# Patient Record
Sex: Male | Born: 1960 | Race: Black or African American | Hispanic: No | Marital: Single | Smoking: Never smoker
Health system: Southern US, Community
[De-identification: ages and names within clinical notes are randomized; demographics above are authoritative.]

## PROBLEM LIST (undated history)

## (undated) DIAGNOSIS — M1990 Unspecified osteoarthritis, unspecified site: Secondary | ICD-10-CM

## (undated) DIAGNOSIS — E785 Hyperlipidemia, unspecified: Secondary | ICD-10-CM

## (undated) DIAGNOSIS — I10 Essential (primary) hypertension: Secondary | ICD-10-CM

## (undated) DIAGNOSIS — F32A Depression, unspecified: Secondary | ICD-10-CM

## (undated) DIAGNOSIS — Z21 Asymptomatic human immunodeficiency virus [HIV] infection status: Secondary | ICD-10-CM

## (undated) DIAGNOSIS — B20 Human immunodeficiency virus [HIV] disease: Secondary | ICD-10-CM

## (undated) DIAGNOSIS — F329 Major depressive disorder, single episode, unspecified: Secondary | ICD-10-CM

## (undated) HISTORY — DX: Essential (primary) hypertension: I10

## (undated) HISTORY — DX: Hyperlipidemia, unspecified: E78.5

## (undated) HISTORY — DX: Depression, unspecified: F32.A

## (undated) HISTORY — DX: Major depressive disorder, single episode, unspecified: F32.9

## (undated) HISTORY — DX: Unspecified osteoarthritis, unspecified site: M19.90

---

## 1997-12-23 ENCOUNTER — Ambulatory Visit (HOSPITAL_COMMUNITY): Admission: RE | Admit: 1997-12-23 | Discharge: 1997-12-23 | Payer: Self-pay | Admitting: Family Medicine

## 1999-01-19 ENCOUNTER — Encounter: Payer: Self-pay | Admitting: Internal Medicine

## 1999-01-19 ENCOUNTER — Ambulatory Visit (HOSPITAL_COMMUNITY): Admission: RE | Admit: 1999-01-19 | Discharge: 1999-01-19 | Payer: Self-pay | Admitting: Internal Medicine

## 1999-01-25 ENCOUNTER — Encounter: Payer: Self-pay | Admitting: Internal Medicine

## 1999-01-25 ENCOUNTER — Ambulatory Visit (HOSPITAL_COMMUNITY): Admission: RE | Admit: 1999-01-25 | Discharge: 1999-01-25 | Payer: Self-pay | Admitting: Internal Medicine

## 1999-02-14 ENCOUNTER — Encounter: Payer: Self-pay | Admitting: Family Medicine

## 1999-02-14 ENCOUNTER — Ambulatory Visit (HOSPITAL_COMMUNITY): Admission: RE | Admit: 1999-02-14 | Discharge: 1999-02-14 | Payer: Self-pay | Admitting: Family Medicine

## 2000-05-25 ENCOUNTER — Encounter: Payer: Self-pay | Admitting: Family Medicine

## 2000-05-25 ENCOUNTER — Ambulatory Visit (HOSPITAL_COMMUNITY): Admission: RE | Admit: 2000-05-25 | Discharge: 2000-05-25 | Payer: Self-pay | Admitting: Family Medicine

## 2000-06-28 ENCOUNTER — Emergency Department (HOSPITAL_COMMUNITY): Admission: EM | Admit: 2000-06-28 | Discharge: 2000-06-28 | Payer: Self-pay | Admitting: Emergency Medicine

## 2001-06-21 ENCOUNTER — Ambulatory Visit (HOSPITAL_COMMUNITY): Admission: RE | Admit: 2001-06-21 | Discharge: 2001-06-21 | Payer: Self-pay | Admitting: Internal Medicine

## 2001-06-21 ENCOUNTER — Encounter: Payer: Self-pay | Admitting: Internal Medicine

## 2002-01-05 ENCOUNTER — Encounter: Payer: Self-pay | Admitting: Emergency Medicine

## 2002-01-05 ENCOUNTER — Emergency Department (HOSPITAL_COMMUNITY): Admission: EM | Admit: 2002-01-05 | Discharge: 2002-01-05 | Payer: Self-pay | Admitting: Emergency Medicine

## 2002-04-01 ENCOUNTER — Ambulatory Visit (HOSPITAL_COMMUNITY): Admission: RE | Admit: 2002-04-01 | Discharge: 2002-04-01 | Payer: Self-pay | Admitting: Internal Medicine

## 2002-04-05 ENCOUNTER — Ambulatory Visit (HOSPITAL_COMMUNITY): Admission: RE | Admit: 2002-04-05 | Discharge: 2002-04-05 | Payer: Self-pay | Admitting: Internal Medicine

## 2002-04-05 ENCOUNTER — Encounter: Payer: Self-pay | Admitting: Internal Medicine

## 2003-08-26 ENCOUNTER — Emergency Department (HOSPITAL_COMMUNITY): Admission: EM | Admit: 2003-08-26 | Discharge: 2003-08-26 | Payer: Self-pay | Admitting: Family Medicine

## 2003-09-01 ENCOUNTER — Emergency Department (HOSPITAL_COMMUNITY): Admission: EM | Admit: 2003-09-01 | Discharge: 2003-09-01 | Payer: Self-pay | Admitting: Family Medicine

## 2003-09-29 ENCOUNTER — Encounter: Admission: RE | Admit: 2003-09-29 | Discharge: 2003-09-29 | Payer: Self-pay | Admitting: Internal Medicine

## 2003-09-29 ENCOUNTER — Ambulatory Visit (HOSPITAL_COMMUNITY): Admission: RE | Admit: 2003-09-29 | Discharge: 2003-09-29 | Payer: Self-pay | Admitting: Internal Medicine

## 2004-03-22 ENCOUNTER — Emergency Department (HOSPITAL_COMMUNITY): Admission: EM | Admit: 2004-03-22 | Discharge: 2004-03-22 | Payer: Self-pay | Admitting: Family Medicine

## 2004-03-22 ENCOUNTER — Ambulatory Visit: Payer: Self-pay | Admitting: *Deleted

## 2004-03-22 ENCOUNTER — Ambulatory Visit: Payer: Self-pay | Admitting: Internal Medicine

## 2004-05-06 ENCOUNTER — Emergency Department (HOSPITAL_COMMUNITY): Admission: EM | Admit: 2004-05-06 | Discharge: 2004-05-06 | Payer: Self-pay | Admitting: Family Medicine

## 2004-06-23 ENCOUNTER — Ambulatory Visit: Payer: Self-pay | Admitting: Internal Medicine

## 2004-08-13 ENCOUNTER — Ambulatory Visit: Payer: Self-pay | Admitting: Family Medicine

## 2004-08-24 ENCOUNTER — Ambulatory Visit: Payer: Self-pay | Admitting: Family Medicine

## 2004-10-15 ENCOUNTER — Ambulatory Visit: Payer: Self-pay | Admitting: Family Medicine

## 2004-12-31 ENCOUNTER — Ambulatory Visit: Payer: Self-pay | Admitting: Family Medicine

## 2005-01-25 ENCOUNTER — Emergency Department (HOSPITAL_COMMUNITY): Admission: EM | Admit: 2005-01-25 | Discharge: 2005-01-25 | Payer: Self-pay | Admitting: Family Medicine

## 2005-01-25 ENCOUNTER — Ambulatory Visit: Payer: Self-pay | Admitting: Family Medicine

## 2005-02-17 ENCOUNTER — Ambulatory Visit: Payer: Self-pay | Admitting: Family Medicine

## 2005-02-18 ENCOUNTER — Ambulatory Visit (HOSPITAL_COMMUNITY): Admission: RE | Admit: 2005-02-18 | Discharge: 2005-02-18 | Payer: Self-pay | Admitting: Family Medicine

## 2005-06-10 ENCOUNTER — Ambulatory Visit: Payer: Self-pay | Admitting: Family Medicine

## 2005-09-23 ENCOUNTER — Ambulatory Visit: Payer: Self-pay | Admitting: Family Medicine

## 2005-10-20 ENCOUNTER — Ambulatory Visit: Payer: Self-pay | Admitting: Family Medicine

## 2006-01-26 ENCOUNTER — Ambulatory Visit: Payer: Self-pay | Admitting: Family Medicine

## 2006-02-01 ENCOUNTER — Ambulatory Visit: Payer: Self-pay | Admitting: Family Medicine

## 2006-03-08 ENCOUNTER — Ambulatory Visit: Payer: Self-pay | Admitting: Family Medicine

## 2006-06-23 ENCOUNTER — Ambulatory Visit: Payer: Self-pay | Admitting: Internal Medicine

## 2006-06-28 ENCOUNTER — Emergency Department (HOSPITAL_COMMUNITY): Admission: EM | Admit: 2006-06-28 | Discharge: 2006-06-28 | Payer: Self-pay | Admitting: Family Medicine

## 2006-06-29 ENCOUNTER — Ambulatory Visit: Payer: Self-pay | Admitting: Internal Medicine

## 2006-07-13 ENCOUNTER — Ambulatory Visit: Payer: Self-pay | Admitting: Internal Medicine

## 2007-02-14 ENCOUNTER — Encounter (INDEPENDENT_AMBULATORY_CARE_PROVIDER_SITE_OTHER): Payer: Self-pay | Admitting: *Deleted

## 2007-03-12 ENCOUNTER — Emergency Department (HOSPITAL_COMMUNITY): Admission: EM | Admit: 2007-03-12 | Discharge: 2007-03-12 | Payer: Self-pay | Admitting: Emergency Medicine

## 2007-03-15 ENCOUNTER — Ambulatory Visit: Payer: Self-pay | Admitting: Internal Medicine

## 2007-03-16 ENCOUNTER — Encounter: Payer: Self-pay | Admitting: Internal Medicine

## 2007-03-16 LAB — CONVERTED CEMR LAB
ALT: 18 units/L (ref 0–53)
Absolute CD4: 568 #/uL (ref 381–1469)
Alkaline Phosphatase: 55 units/L (ref 39–117)
BUN: 18 mg/dL (ref 6–23)
Basophils Relative: 0 % (ref 0–1)
CO2: 18 meq/L — ABNORMAL LOW (ref 19–32)
Chloride: 106 meq/L (ref 96–112)
Glucose, Bld: 115 mg/dL — ABNORMAL HIGH (ref 70–99)
HCT: 44 % (ref 39.0–52.0)
HDL: 46 mg/dL (ref >39)
Hemoglobin: 14.2 g/dL (ref 13.0–17.0)
LDL Cholesterol: 279 mg/dL — ABNORMAL HIGH (ref 0–99)
Potassium: 4.1 meq/L (ref 3.5–5.3)
RBC: 4.19 M/uL — ABNORMAL LOW (ref 4.22–5.81)
RDW: 13.1 % (ref 11.5–14.0)
Sodium: 137 meq/L (ref 135–145)
Total CHOL/HDL Ratio: 7.6
Total Lymphocytes %: 19 % (ref 12–46)
Triglycerides: 116 mg/dL (ref <150)
VLDL: 23 mg/dL (ref 0–40)
WBC: 8.3 10*3/uL (ref 4.0–10.5)

## 2007-03-19 ENCOUNTER — Emergency Department (HOSPITAL_COMMUNITY): Admission: EM | Admit: 2007-03-19 | Discharge: 2007-03-19 | Payer: Self-pay | Admitting: Family Medicine

## 2007-05-17 ENCOUNTER — Ambulatory Visit: Payer: Self-pay | Admitting: Internal Medicine

## 2007-06-18 ENCOUNTER — Ambulatory Visit: Payer: Self-pay | Admitting: Internal Medicine

## 2007-06-18 LAB — CONVERTED CEMR LAB
ALT: 25 units/L (ref 0–53)
Albumin: 4.1 g/dL (ref 3.5–5.2)
Basophils Relative: 1 % (ref 0–1)
CO2: 21 meq/L (ref 19–32)
Chloride: 105 meq/L (ref 96–112)
Eosinophils Absolute: 0 10*3/uL (ref 0.0–0.7)
Eosinophils Relative: 1 % (ref 0–5)
Glucose, Bld: 93 mg/dL (ref 70–99)
HIV 1 RNA Quant: <50 copies/mL (ref <50)
HIV-1 RNA Quant, Log: <1.7 (ref <1.70)
Hemoglobin: 14.2 g/dL (ref 13.0–17.0)
MCV: 102.9 fL — ABNORMAL HIGH (ref 78.0–100.0)
Monocytes Relative: 11 % (ref 3–12)
Platelets: 220 10*3/uL (ref 150–400)
RBC: 4.16 M/uL — ABNORMAL LOW (ref 4.22–5.81)
Total CHOL/HDL Ratio: 4
Total Protein: 7.7 g/dL (ref 6.0–8.3)
VLDL: 31 mg/dL (ref 0–40)
WBC: 3.5 10*3/uL — ABNORMAL LOW (ref 4.0–10.5)

## 2007-06-24 ENCOUNTER — Emergency Department (HOSPITAL_COMMUNITY): Admission: EM | Admit: 2007-06-24 | Discharge: 2007-06-24 | Payer: Self-pay | Admitting: Family Medicine

## 2007-07-12 ENCOUNTER — Ambulatory Visit: Payer: Self-pay | Admitting: Internal Medicine

## 2007-07-26 ENCOUNTER — Ambulatory Visit: Payer: Self-pay | Admitting: Internal Medicine

## 2007-08-02 ENCOUNTER — Ambulatory Visit: Payer: Self-pay | Admitting: Internal Medicine

## 2007-08-09 ENCOUNTER — Ambulatory Visit: Payer: Self-pay | Admitting: Internal Medicine

## 2007-09-05 ENCOUNTER — Emergency Department (HOSPITAL_COMMUNITY): Admission: EM | Admit: 2007-09-05 | Discharge: 2007-09-05 | Payer: Self-pay | Admitting: Emergency Medicine

## 2007-10-02 ENCOUNTER — Ambulatory Visit: Payer: Self-pay | Admitting: Internal Medicine

## 2007-11-21 ENCOUNTER — Emergency Department (HOSPITAL_COMMUNITY): Admission: EM | Admit: 2007-11-21 | Discharge: 2007-11-21 | Payer: Self-pay | Admitting: Emergency Medicine

## 2007-11-26 ENCOUNTER — Ambulatory Visit: Payer: Self-pay | Admitting: Internal Medicine

## 2007-11-27 ENCOUNTER — Encounter: Payer: Self-pay | Admitting: Internal Medicine

## 2007-11-27 LAB — CONVERTED CEMR LAB
ALT: 28 units/L (ref 0–53)
Alkaline Phosphatase: 68 units/L (ref 39–117)
BUN: 16 mg/dL (ref 6–23)
Basophils Absolute: 0 10*3/uL (ref 0.0–0.1)
Basophils Relative: 1 % (ref 0–1)
CO2: 20 meq/L (ref 19–32)
Chlamydia, Swab/Urine, PCR: NEGATIVE
Chloride: 103 meq/L (ref 96–112)
Creatinine, Ser: 1.1 mg/dL (ref 0.40–1.50)
Eosinophils Absolute: 0.1 10*3/uL (ref 0.0–0.7)
Eosinophils Relative: 2 % (ref 0–5)
GC Probe Amp, Urine: NEGATIVE
Glucose, Bld: 91 mg/dL (ref 70–99)
HCT: 44.3 % (ref 39.0–52.0)
HCV Ab: NEGATIVE
Hep A Total Ab: POSITIVE — AB
MCV: 106.5 fL — ABNORMAL HIGH (ref 78.0–100.0)
Monocytes Absolute: 0.4 10*3/uL (ref 0.1–1.0)
Monocytes Relative: 9 % (ref 3–12)
Platelets: 222 10*3/uL (ref 150–400)
Potassium: 4.2 meq/L (ref 3.5–5.3)
RDW: 13.3 % (ref 11.5–15.5)
Total Lymphocytes %: 38 % (ref 12–46)
Total Protein: 7.8 g/dL (ref 6.0–8.3)
WBC, lymph enumeration: 4.4 10*3/uL (ref 4.0–10.5)

## 2007-11-28 ENCOUNTER — Ambulatory Visit: Payer: Self-pay | Admitting: Internal Medicine

## 2008-02-28 ENCOUNTER — Ambulatory Visit: Payer: Self-pay | Admitting: Internal Medicine

## 2008-03-06 ENCOUNTER — Ambulatory Visit: Payer: Self-pay | Admitting: Internal Medicine

## 2008-03-06 LAB — CONVERTED CEMR LAB
AST: 22 units/L (ref 0–37)
BUN: 13 mg/dL (ref 6–23)
Basophils Absolute: 0 10*3/uL (ref 0.0–0.1)
Basophils Relative: 1 % (ref 0–1)
CO2: 21 meq/L (ref 19–32)
Eosinophils Absolute: 0.1 10*3/uL (ref 0.0–0.7)
Eosinophils Relative: 2 % (ref 0–5)
Glucose, Bld: 91 mg/dL (ref 70–99)
HCT: 41.6 % (ref 39.0–52.0)
HIV-1 RNA Quant, Log: <1.7 (ref <1.70)
Monocytes Absolute: 0.4 10*3/uL (ref 0.1–1.0)
Monocytes Relative: 9 % (ref 3–12)
Neutro Abs: 1.8 10*3/uL (ref 1.7–7.7)
Platelets: 201 10*3/uL (ref 150–400)
Potassium: 4.2 meq/L (ref 3.5–5.3)
RBC: 4.06 M/uL — ABNORMAL LOW (ref 4.22–5.81)
RDW: 12.9 % (ref 11.5–15.5)
Sodium: 137 meq/L (ref 135–145)
Total Bilirubin: 0.4 mg/dL (ref 0.3–1.2)
Total Protein: 7.7 g/dL (ref 6.0–8.3)
WBC, lymph enumeration: 4.4 10*3/uL (ref 4.0–10.5)
WBC: 4.4 10*3/uL (ref 4.0–10.5)

## 2008-03-07 ENCOUNTER — Ambulatory Visit: Payer: Self-pay | Admitting: Internal Medicine

## 2008-03-20 ENCOUNTER — Ambulatory Visit: Payer: Self-pay | Admitting: Internal Medicine

## 2008-05-01 ENCOUNTER — Ambulatory Visit: Payer: Self-pay | Admitting: Internal Medicine

## 2008-05-24 ENCOUNTER — Emergency Department (HOSPITAL_COMMUNITY): Admission: EM | Admit: 2008-05-24 | Discharge: 2008-05-24 | Payer: Self-pay | Admitting: Family Medicine

## 2008-06-05 ENCOUNTER — Ambulatory Visit: Payer: Self-pay | Admitting: Internal Medicine

## 2008-06-12 ENCOUNTER — Ambulatory Visit: Payer: Self-pay | Admitting: Internal Medicine

## 2008-06-12 ENCOUNTER — Ambulatory Visit (HOSPITAL_COMMUNITY): Admission: RE | Admit: 2008-06-12 | Discharge: 2008-06-12 | Payer: Self-pay | Admitting: Internal Medicine

## 2008-06-18 ENCOUNTER — Ambulatory Visit: Payer: Self-pay | Admitting: Internal Medicine

## 2008-06-18 LAB — CONVERTED CEMR LAB
AST: 16 units/L (ref 0–37)
BUN: 15 mg/dL (ref 6–23)
Basophils Absolute: 0 10*3/uL (ref 0.0–0.1)
CD4 T Helper %: 40 % (ref 32–62)
Calcium: 9.2 mg/dL (ref 8.4–10.5)
Chloride: 104 meq/L (ref 96–112)
Creatinine, Ser: 0.91 mg/dL (ref 0.40–1.50)
Eosinophils Relative: 2 % (ref 0–5)
Glucose, Bld: 102 mg/dL — ABNORMAL HIGH (ref 70–99)
MCHC: 32.8 g/dL (ref 30.0–36.0)
Monocytes Absolute: 0.4 10*3/uL (ref 0.1–1.0)
Monocytes Relative: 8 % (ref 3–12)
Neutrophils Relative %: 36 % — ABNORMAL LOW (ref 43–77)
Potassium: 4.6 meq/L (ref 3.5–5.3)
RBC: 3.94 M/uL — ABNORMAL LOW (ref 4.22–5.81)
RDW: 13.2 % (ref 11.5–15.5)
Sodium: 137 meq/L (ref 135–145)
Total Bilirubin: 0.3 mg/dL (ref 0.3–1.2)
Total Lymphocytes %: 54 % — ABNORMAL HIGH (ref 12–46)
Total Protein: 7.8 g/dL (ref 6.0–8.3)
Total lymphocyte count: 2700 cells/mcL (ref 700–3300)
WBC, lymph enumeration: 5 10*3/uL (ref 4.0–10.5)

## 2008-07-16 ENCOUNTER — Encounter: Payer: Self-pay | Admitting: Internal Medicine

## 2008-07-17 ENCOUNTER — Ambulatory Visit: Payer: Self-pay | Admitting: Internal Medicine

## 2008-07-17 ENCOUNTER — Encounter: Payer: Self-pay | Admitting: Internal Medicine

## 2008-07-17 DIAGNOSIS — E785 Hyperlipidemia, unspecified: Secondary | ICD-10-CM

## 2008-07-17 DIAGNOSIS — B20 Human immunodeficiency virus [HIV] disease: Secondary | ICD-10-CM | POA: Insufficient documentation

## 2008-07-17 DIAGNOSIS — I10 Essential (primary) hypertension: Secondary | ICD-10-CM

## 2008-07-17 DIAGNOSIS — M542 Cervicalgia: Secondary | ICD-10-CM

## 2008-07-17 DIAGNOSIS — M545 Low back pain: Secondary | ICD-10-CM

## 2008-07-17 DIAGNOSIS — K644 Residual hemorrhoidal skin tags: Secondary | ICD-10-CM | POA: Insufficient documentation

## 2008-07-22 ENCOUNTER — Encounter: Payer: Self-pay | Admitting: Internal Medicine

## 2008-07-24 ENCOUNTER — Encounter: Payer: Self-pay | Admitting: Internal Medicine

## 2008-07-28 ENCOUNTER — Emergency Department (HOSPITAL_COMMUNITY): Admission: EM | Admit: 2008-07-28 | Discharge: 2008-07-28 | Payer: Self-pay | Admitting: Emergency Medicine

## 2008-07-31 ENCOUNTER — Encounter: Payer: Self-pay | Admitting: Internal Medicine

## 2008-07-31 ENCOUNTER — Ambulatory Visit: Payer: Self-pay | Admitting: Internal Medicine

## 2008-07-31 DIAGNOSIS — J209 Acute bronchitis, unspecified: Secondary | ICD-10-CM

## 2008-08-06 ENCOUNTER — Encounter: Admission: RE | Admit: 2008-08-06 | Discharge: 2008-09-03 | Payer: Self-pay | Admitting: Internal Medicine

## 2008-08-06 ENCOUNTER — Emergency Department (HOSPITAL_COMMUNITY): Admission: EM | Admit: 2008-08-06 | Discharge: 2008-08-06 | Payer: Self-pay | Admitting: Emergency Medicine

## 2008-09-04 ENCOUNTER — Ambulatory Visit: Payer: Self-pay | Admitting: Internal Medicine

## 2008-09-04 LAB — CONVERTED CEMR LAB
Absolute CD4: 1166 #/uL (ref 381–1469)
Albumin: 4.1 g/dL (ref 3.5–5.2)
CD4 T Helper %: 40 % (ref 32–62)
CO2: 22 meq/L (ref 19–32)
Calcium: 9.9 mg/dL (ref 8.4–10.5)
Creatinine, Ser: 0.89 mg/dL (ref 0.40–1.50)
Eosinophils Absolute: 0.1 10*3/uL (ref 0.0–0.7)
HCT: 40.6 % (ref 39.0–52.0)
HDL: 40 mg/dL (ref >39)
LDL Cholesterol: 218 mg/dL — ABNORMAL HIGH (ref 0–99)
Lymphocytes Relative: 54 % — ABNORMAL HIGH (ref 12–46)
MCHC: 31.3 g/dL (ref 30.0–36.0)
MCV: 101.8 fL — ABNORMAL HIGH (ref 78.0–100.0)
Neutro Abs: 1.7 10*3/uL (ref 1.7–7.7)
Neutrophils Relative %: 32 % — ABNORMAL LOW (ref 43–77)
Platelets: 216 10*3/uL (ref 150–400)
Potassium: 4.1 meq/L (ref 3.5–5.3)
RBC: 3.99 M/uL — ABNORMAL LOW (ref 4.22–5.81)
RDW: 13.1 % (ref 11.5–15.5)
Sodium: 135 meq/L (ref 135–145)
Total Lymphocytes %: 54 % — ABNORMAL HIGH (ref 12–46)
Total lymphocyte count: 2916 cells/mcL (ref 700–3300)
Triglycerides: 114 mg/dL (ref <150)
WBC, lymph enumeration: 5.4 10*3/uL (ref 4.0–10.5)

## 2008-09-09 ENCOUNTER — Encounter: Payer: Self-pay | Admitting: Internal Medicine

## 2008-09-11 ENCOUNTER — Ambulatory Visit: Payer: Self-pay | Admitting: Internal Medicine

## 2008-09-11 ENCOUNTER — Encounter: Payer: Self-pay | Admitting: Internal Medicine

## 2008-09-12 DIAGNOSIS — M79609 Pain in unspecified limb: Secondary | ICD-10-CM | POA: Insufficient documentation

## 2008-09-19 ENCOUNTER — Encounter: Payer: Self-pay | Admitting: Internal Medicine

## 2008-09-24 ENCOUNTER — Telehealth: Payer: Self-pay | Admitting: Internal Medicine

## 2008-11-10 ENCOUNTER — Telehealth: Payer: Self-pay | Admitting: Internal Medicine

## 2008-11-25 ENCOUNTER — Emergency Department (HOSPITAL_COMMUNITY): Admission: EM | Admit: 2008-11-25 | Discharge: 2008-11-25 | Payer: Self-pay | Admitting: Family Medicine

## 2008-11-27 ENCOUNTER — Ambulatory Visit: Payer: Self-pay | Admitting: Family Medicine

## 2008-11-27 ENCOUNTER — Encounter: Payer: Self-pay | Admitting: Internal Medicine

## 2008-11-27 LAB — CONVERTED CEMR LAB
ALT: 19 units/L (ref 0–53)
BUN: 11 mg/dL (ref 6–23)
Basophils Relative: 0 % (ref 0–1)
Eosinophils Absolute: 0.1 10*3/uL (ref 0.0–0.7)
Eosinophils Relative: 2 % (ref 0–5)
Glucose, Bld: 124 mg/dL — ABNORMAL HIGH (ref 70–99)
HDL: 43 mg/dL (ref >39)
LDL Cholesterol: 108 mg/dL — ABNORMAL HIGH (ref 0–99)
Lymphocytes Relative: 44 % (ref 12–46)
MCHC: 33 g/dL (ref 30.0–36.0)
Neutrophils Relative %: 46 % (ref 43–77)
Potassium: 4.2 meq/L (ref 3.5–5.3)
RBC: 4.01 M/uL — ABNORMAL LOW (ref 4.22–5.81)
RDW: 13.1 % (ref 11.5–15.5)
Sodium: 138 meq/L (ref 135–145)
Total CHOL/HDL Ratio: 4.1
Total lymphocyte count: 2156 cells/mcL (ref 700–3300)
Triglycerides: 123 mg/dL (ref <150)
VLDL: 25 mg/dL (ref 0–40)
WBC: 4.9 10*3/uL (ref 4.0–10.5)

## 2008-12-15 ENCOUNTER — Ambulatory Visit: Payer: Self-pay | Admitting: Internal Medicine

## 2008-12-15 ENCOUNTER — Encounter: Payer: Self-pay | Admitting: Internal Medicine

## 2009-03-11 ENCOUNTER — Ambulatory Visit: Payer: Self-pay | Admitting: Internal Medicine

## 2009-03-11 LAB — CONVERTED CEMR LAB
ALT: 20 units/L (ref 0–53)
Absolute CD4: 811 #/uL (ref 381–1469)
Basophils Relative: 0 % (ref 0–1)
CO2: 20 meq/L (ref 19–32)
Cholesterol: 260 mg/dL — ABNORMAL HIGH (ref 0–200)
Eosinophils Absolute: 0.1 10*3/uL (ref 0.0–0.7)
Eosinophils Relative: 2 % (ref 0–5)
GC Probe Amp, Urine: NEGATIVE
HCT: 40.5 % (ref 39.0–52.0)
HIV 1 RNA Quant: <48 copies/mL (ref <48)
Hemoglobin: 13.4 g/dL (ref 13.0–17.0)
Lymphocytes Relative: 39 % (ref 12–46)
MCHC: 33.1 g/dL (ref 30.0–36.0)
MCV: 101 fL — ABNORMAL HIGH (ref 78.0–100.0)
Neutro Abs: 2.7 10*3/uL (ref 1.7–7.7)
Neutrophils Relative %: 52 % (ref 43–77)
Platelets: 212 10*3/uL (ref 150–400)
RBC: 4.01 M/uL — ABNORMAL LOW (ref 4.22–5.81)
RDW: 12.9 % (ref 11.5–15.5)
Total lymphocyte count: 2028 cells/mcL (ref 700–3300)
WBC: 5.2 10*3/uL (ref 4.0–10.5)

## 2009-03-12 ENCOUNTER — Encounter: Payer: Self-pay | Admitting: Internal Medicine

## 2009-03-13 ENCOUNTER — Telehealth: Payer: Self-pay | Admitting: Internal Medicine

## 2009-03-16 ENCOUNTER — Ambulatory Visit: Payer: Self-pay | Admitting: Internal Medicine

## 2009-03-16 ENCOUNTER — Encounter: Payer: Self-pay | Admitting: Internal Medicine

## 2009-03-16 DIAGNOSIS — E119 Type 2 diabetes mellitus without complications: Secondary | ICD-10-CM

## 2009-03-16 LAB — CONVERTED CEMR LAB: Hgb A1c MFr Bld: 8.9 %

## 2009-03-23 ENCOUNTER — Encounter: Payer: Self-pay | Admitting: Internal Medicine

## 2009-03-23 ENCOUNTER — Ambulatory Visit: Payer: Self-pay | Admitting: Internal Medicine

## 2009-03-23 LAB — CONVERTED CEMR LAB: Blood Glucose, Fingerstick: 316

## 2009-04-09 ENCOUNTER — Encounter: Payer: Self-pay | Admitting: Internal Medicine

## 2009-04-09 ENCOUNTER — Ambulatory Visit: Payer: Self-pay | Admitting: Internal Medicine

## 2009-04-16 ENCOUNTER — Encounter: Payer: Self-pay | Admitting: Internal Medicine

## 2009-04-16 ENCOUNTER — Ambulatory Visit: Payer: Self-pay | Admitting: Internal Medicine

## 2009-04-16 LAB — CONVERTED CEMR LAB
Blood Glucose, Fingerstick: 97
Total CK: 179 units/L (ref 7–232)

## 2009-04-21 ENCOUNTER — Ambulatory Visit: Payer: Self-pay | Admitting: Family Medicine

## 2009-04-30 ENCOUNTER — Ambulatory Visit: Payer: Self-pay | Admitting: Internal Medicine

## 2009-04-30 ENCOUNTER — Encounter: Payer: Self-pay | Admitting: Internal Medicine

## 2009-04-30 DIAGNOSIS — J0100 Acute maxillary sinusitis, unspecified: Secondary | ICD-10-CM

## 2009-04-30 LAB — CONVERTED CEMR LAB: Blood Glucose, Fingerstick: 131

## 2009-05-05 ENCOUNTER — Emergency Department (HOSPITAL_COMMUNITY): Admission: EM | Admit: 2009-05-05 | Discharge: 2009-05-05 | Payer: Self-pay | Admitting: Emergency Medicine

## 2009-06-01 ENCOUNTER — Encounter: Payer: Self-pay | Admitting: Internal Medicine

## 2009-06-01 ENCOUNTER — Emergency Department (HOSPITAL_COMMUNITY): Admission: EM | Admit: 2009-06-01 | Discharge: 2009-06-01 | Payer: Self-pay | Admitting: Family Medicine

## 2009-06-01 ENCOUNTER — Ambulatory Visit: Payer: Self-pay | Admitting: Internal Medicine

## 2009-06-01 DIAGNOSIS — R1031 Right lower quadrant pain: Secondary | ICD-10-CM

## 2009-06-01 LAB — CONVERTED CEMR LAB: Blood Glucose, Fingerstick: 126

## 2009-06-15 ENCOUNTER — Ambulatory Visit: Payer: Self-pay | Admitting: Internal Medicine

## 2009-06-15 ENCOUNTER — Encounter: Payer: Self-pay | Admitting: Internal Medicine

## 2009-06-15 ENCOUNTER — Emergency Department (HOSPITAL_COMMUNITY): Admission: EM | Admit: 2009-06-15 | Discharge: 2009-06-15 | Payer: Self-pay | Admitting: Emergency Medicine

## 2009-06-15 DIAGNOSIS — M25559 Pain in unspecified hip: Secondary | ICD-10-CM | POA: Insufficient documentation

## 2009-06-15 LAB — CONVERTED CEMR LAB
ALT: 16 units/L (ref 0–53)
AST: 16 units/L (ref 0–37)
Albumin: 4.3 g/dL (ref 3.5–5.2)
CO2: 21 meq/L (ref 19–32)
Calcium: 10.2 mg/dL (ref 8.4–10.5)
Chloride: 103 meq/L (ref 96–112)
Glucose, Bld: 155 mg/dL — ABNORMAL HIGH (ref 70–99)
HIV 1 RNA Quant: <48 copies/mL (ref <48)
HIV-1 RNA Quant, Log: <1.68 (ref <1.68)
Potassium: 4.2 meq/L (ref 3.5–5.3)
Sodium: 136 meq/L (ref 135–145)
Total Bilirubin: 0.4 mg/dL (ref 0.3–1.2)
Total Protein: 8.2 g/dL (ref 6.0–8.3)

## 2009-06-16 ENCOUNTER — Ambulatory Visit (HOSPITAL_COMMUNITY): Admission: RE | Admit: 2009-06-16 | Discharge: 2009-06-16 | Payer: Self-pay | Admitting: Internal Medicine

## 2009-06-18 ENCOUNTER — Telehealth: Payer: Self-pay | Admitting: Internal Medicine

## 2009-07-13 ENCOUNTER — Emergency Department (HOSPITAL_COMMUNITY): Admission: EM | Admit: 2009-07-13 | Discharge: 2009-07-13 | Payer: Self-pay | Admitting: Family Medicine

## 2009-07-16 ENCOUNTER — Encounter: Payer: Self-pay | Admitting: Internal Medicine

## 2009-07-16 ENCOUNTER — Ambulatory Visit: Payer: Self-pay | Admitting: Internal Medicine

## 2009-07-16 DIAGNOSIS — J020 Streptococcal pharyngitis: Secondary | ICD-10-CM

## 2009-07-16 LAB — CONVERTED CEMR LAB: Blood Glucose, Fingerstick: 161

## 2009-08-03 ENCOUNTER — Emergency Department (HOSPITAL_COMMUNITY): Admission: EM | Admit: 2009-08-03 | Discharge: 2009-08-03 | Payer: Self-pay | Admitting: Family Medicine

## 2009-08-06 ENCOUNTER — Ambulatory Visit: Payer: Self-pay | Admitting: Family Medicine

## 2009-08-06 ENCOUNTER — Encounter: Payer: Self-pay | Admitting: Internal Medicine

## 2009-08-06 DIAGNOSIS — R197 Diarrhea, unspecified: Secondary | ICD-10-CM

## 2009-08-18 ENCOUNTER — Telehealth: Payer: Self-pay | Admitting: Internal Medicine

## 2009-08-27 ENCOUNTER — Encounter: Payer: Self-pay | Admitting: Internal Medicine

## 2009-08-27 ENCOUNTER — Ambulatory Visit: Payer: Self-pay | Admitting: Internal Medicine

## 2009-09-09 ENCOUNTER — Telehealth (INDEPENDENT_AMBULATORY_CARE_PROVIDER_SITE_OTHER): Payer: Self-pay | Admitting: *Deleted

## 2009-09-11 ENCOUNTER — Telehealth: Payer: Self-pay | Admitting: Internal Medicine

## 2009-09-24 ENCOUNTER — Ambulatory Visit: Payer: Self-pay | Admitting: Internal Medicine

## 2009-09-25 LAB — CONVERTED CEMR LAB
AST: 20 units/L (ref 0–37)
Albumin: 4.2 g/dL (ref 3.5–5.2)
BUN: 15 mg/dL (ref 6–23)
Calcium: 9.6 mg/dL (ref 8.4–10.5)
Chloride: 102 meq/L (ref 96–112)
Cholesterol: 333 mg/dL — ABNORMAL HIGH (ref 0–200)
Creatinine, Ser: 1.2 mg/dL (ref 0.40–1.50)
HDL: 36 mg/dL — ABNORMAL LOW (ref >39)
Lymphocytes Relative: 44 % (ref 12–46)
MCV: 100.5 fL — ABNORMAL HIGH (ref 78.0–100.0)
Monocytes Relative: 7 % (ref 3–12)
Neutro Abs: 2.3 10*3/uL (ref 1.7–7.7)
Platelets: 214 10*3/uL (ref 150–400)
RDW: 12.9 % (ref 11.5–15.5)
Triglycerides: 404 mg/dL — ABNORMAL HIGH (ref <150)
WBC: 4.9 10*3/uL (ref 4.0–10.5)

## 2009-10-02 ENCOUNTER — Telehealth: Payer: Self-pay | Admitting: Internal Medicine

## 2009-10-06 ENCOUNTER — Encounter: Payer: Self-pay | Admitting: Internal Medicine

## 2009-10-08 ENCOUNTER — Ambulatory Visit: Payer: Self-pay | Admitting: Internal Medicine

## 2009-10-12 ENCOUNTER — Encounter (INDEPENDENT_AMBULATORY_CARE_PROVIDER_SITE_OTHER): Payer: Self-pay | Admitting: *Deleted

## 2009-10-22 ENCOUNTER — Telehealth: Payer: Self-pay | Admitting: Internal Medicine

## 2009-10-27 ENCOUNTER — Encounter: Payer: Self-pay | Admitting: Internal Medicine

## 2009-11-03 ENCOUNTER — Telehealth: Payer: Self-pay | Admitting: Internal Medicine

## 2009-11-06 ENCOUNTER — Encounter: Payer: Self-pay | Admitting: Internal Medicine

## 2009-11-11 ENCOUNTER — Ambulatory Visit: Payer: Self-pay | Admitting: Internal Medicine

## 2009-11-11 DIAGNOSIS — B353 Tinea pedis: Secondary | ICD-10-CM

## 2009-11-12 ENCOUNTER — Encounter: Payer: Self-pay | Admitting: Internal Medicine

## 2009-12-23 ENCOUNTER — Encounter: Payer: Self-pay | Admitting: Internal Medicine

## 2009-12-28 ENCOUNTER — Telehealth: Payer: Self-pay | Admitting: Internal Medicine

## 2009-12-29 ENCOUNTER — Telehealth: Payer: Self-pay | Admitting: Internal Medicine

## 2010-01-04 ENCOUNTER — Encounter: Payer: Self-pay | Admitting: Internal Medicine

## 2010-01-12 ENCOUNTER — Ambulatory Visit: Payer: Self-pay | Admitting: Internal Medicine

## 2010-01-12 LAB — CONVERTED CEMR LAB
AST: 18 units/L (ref 0–37)
Basophils Absolute: 0 10*3/uL (ref 0.0–0.1)
Calcium: 9.2 mg/dL (ref 8.4–10.5)
Chloride: 101 meq/L (ref 96–112)
Eosinophils Absolute: 0.2 10*3/uL (ref 0.0–0.7)
Eosinophils Relative: 4 % (ref 0–5)
Glucose, Bld: 134 mg/dL — ABNORMAL HIGH (ref 70–99)
HIV 1 RNA Quant: <48 copies/mL (ref <48)
HIV-1 RNA Quant, Log: <1.68 (ref <1.68)
Hemoglobin: 12.8 g/dL — ABNORMAL LOW (ref 13.0–17.0)
Lymphocytes Relative: 43 % (ref 12–46)
MCHC: 32.2 g/dL (ref 30.0–36.0)
MCV: 103.4 fL — ABNORMAL HIGH (ref 78.0–100.0)
Monocytes Absolute: 0.3 10*3/uL (ref 0.1–1.0)
Monocytes Relative: 6 % (ref 3–12)
Neutrophils Relative %: 47 % (ref 43–77)
Platelets: 215 10*3/uL (ref 150–400)
Sodium: 132 meq/L — ABNORMAL LOW (ref 135–145)
Total Protein: 7.2 g/dL (ref 6.0–8.3)
WBC: 5.1 10*3/uL (ref 4.0–10.5)

## 2010-01-25 ENCOUNTER — Ambulatory Visit: Payer: Self-pay | Admitting: Internal Medicine

## 2010-01-26 ENCOUNTER — Telehealth: Payer: Self-pay | Admitting: Internal Medicine

## 2010-03-01 ENCOUNTER — Telehealth: Payer: Self-pay | Admitting: Internal Medicine

## 2010-03-02 ENCOUNTER — Telehealth: Payer: Self-pay | Admitting: Internal Medicine

## 2010-03-05 ENCOUNTER — Telehealth: Payer: Self-pay | Admitting: Internal Medicine

## 2010-03-10 ENCOUNTER — Telehealth (INDEPENDENT_AMBULATORY_CARE_PROVIDER_SITE_OTHER): Payer: Self-pay | Admitting: *Deleted

## 2010-04-09 ENCOUNTER — Encounter: Payer: Self-pay | Admitting: Internal Medicine

## 2010-04-19 ENCOUNTER — Encounter: Payer: Self-pay | Admitting: Internal Medicine

## 2010-04-20 ENCOUNTER — Encounter (INDEPENDENT_AMBULATORY_CARE_PROVIDER_SITE_OTHER): Payer: Self-pay | Admitting: *Deleted

## 2010-04-26 ENCOUNTER — Ambulatory Visit: Payer: Self-pay | Admitting: Internal Medicine

## 2010-04-26 LAB — CONVERTED CEMR LAB: HIV 1 RNA Quant: <20 copies/mL (ref <20)

## 2010-04-27 ENCOUNTER — Telehealth: Payer: Self-pay | Admitting: Internal Medicine

## 2010-04-28 LAB — CONVERTED CEMR LAB
AST: 14 units/L (ref 0–37)
Albumin: 4.2 g/dL (ref 3.5–5.2)
Basophils Absolute: 0 10*3/uL (ref 0.0–0.1)
Chloride: 101 meq/L (ref 96–112)
Creatinine, Ser: 0.97 mg/dL (ref 0.40–1.50)
Eosinophils Absolute: 0.2 10*3/uL (ref 0.0–0.7)
Glucose, Bld: 314 mg/dL — ABNORMAL HIGH (ref 70–99)
HDL: 37 mg/dL — ABNORMAL LOW (ref >39)
LDL Cholesterol: 191 mg/dL — ABNORMAL HIGH (ref 0–99)
MCHC: 33.1 g/dL (ref 30.0–36.0)
MCV: 101.6 fL — ABNORMAL HIGH (ref 78.0–100.0)
Monocytes Relative: 6 % (ref 3–12)
Neutro Abs: 2.8 10*3/uL (ref 1.7–7.7)
RBC: 3.87 M/uL — ABNORMAL LOW (ref 4.22–5.81)
RDW: 13.1 % (ref 11.5–15.5)
Total CHOL/HDL Ratio: 8.1
VLDL: 71 mg/dL — ABNORMAL HIGH (ref 0–40)
WBC: 5.2 10*3/uL (ref 4.0–10.5)

## 2010-04-30 ENCOUNTER — Encounter: Payer: Self-pay | Admitting: Internal Medicine

## 2010-05-03 ENCOUNTER — Telehealth (INDEPENDENT_AMBULATORY_CARE_PROVIDER_SITE_OTHER): Payer: Self-pay | Admitting: *Deleted

## 2010-05-04 ENCOUNTER — Emergency Department (HOSPITAL_COMMUNITY)
Admission: EM | Admit: 2010-05-04 | Discharge: 2010-05-04 | Payer: Self-pay | Source: Home / Self Care | Admitting: Family Medicine

## 2010-05-11 ENCOUNTER — Ambulatory Visit: Payer: Self-pay | Admitting: Internal Medicine

## 2010-05-13 ENCOUNTER — Telehealth (INDEPENDENT_AMBULATORY_CARE_PROVIDER_SITE_OTHER): Payer: Self-pay | Admitting: *Deleted

## 2010-05-25 ENCOUNTER — Telehealth: Payer: Self-pay | Admitting: Internal Medicine

## 2010-06-02 ENCOUNTER — Telehealth (INDEPENDENT_AMBULATORY_CARE_PROVIDER_SITE_OTHER): Payer: Self-pay | Admitting: *Deleted

## 2010-06-07 ENCOUNTER — Encounter: Payer: Self-pay | Admitting: Internal Medicine

## 2010-06-07 ENCOUNTER — Telehealth (INDEPENDENT_AMBULATORY_CARE_PROVIDER_SITE_OTHER): Payer: Self-pay | Admitting: *Deleted

## 2010-07-01 NOTE — Progress Notes (Signed)
Summary: refill/smld  Phone Note From Pharmacy   Caller: [facility] Reason for Call: Needs renewal Summary of Call: [facility] left message stating that they need refills on the Combivir and Viracept called into [facility] [place], [place]. Initial call taken by: Paulo Fruit  BS,CPht II,MPH,  December 28, 2009 1:56 PM    Prescriptions: VIRACEPT 625 MG TABS (NELFINAVIR MESYLATE) Take 2 tablets two times a day  #120 x 6   Entered by:   Paulo Fruit  BS,CPht II,MPH   Authorized by:   Yisroel Ramming MD   Signed by:   Paulo Fruit  BS,CPht II,MPH on 12/28/2009   Method used:   Telephoned to ...       [facility] [phone]* (retail)       [address]       [place], [place]  [address]       Ph: [phone]       Fax:    RxID:   [id number] COMBIVIR 150-300 MG TABS (LAMIVUDINE-ZIDOVUDINE) Take 1 tablet by mouth two times a day  #60 x 6   Entered by:   Paulo Fruit  BS,CPht II,MPH   Authorized by:   Yisroel Ramming MD   Signed by:   Paulo Fruit  BS,CPht II,MPH on 12/28/2009   Method used:   Telephoned to ...       [facility] [phone]* (retail)       [address]       [place], [place]  [address]       Ph: [phone]       Fax:    RxID:   [id number]  Left message on pharmacy physician line. Paulo Fruit  BS,CPht II,MPH  December 28, 2009 1:59 PM

## 2010-07-01 NOTE — Miscellaneous (Signed)
Summary: Office Visit ([facility])    Vital Signs:  Patient profile:   50 year old male Weight:      200.2 pounds Temp:     98.8 degrees F oral Pulse rate:   88 / minute Pulse rhythm:   regular Resp:     16 per minute BP sitting:   113 / 70  (left arm)  Vitals Entered By: Sharen Heck RN (June 01, 2009 4:08 PM) CC: reports RLQ pain x 2 days Is Patient Diabetic? Yes Did you bring your meter with you today? No Pain Assessment Patient in pain? no      CBG Result 126  Does patient need assistance? Functional Status Self care Ambulation Normal   CC:  reports RLQ pain x 2 days.  History of Present Illness: Pt devloped RLQ abdominal pain 2 days ago.  No associated diarrhea or vomiting.  No blood in his urine.  He had eatens some leftover food and drank some ETOH prior to the pain.  He went to ED where his urine was checked and found to be negative and his CBC was wnl.  He also had a KUB which was unremarkable. The pain has mostly subsided.  He just wanted to make sure nothing was missed.  Preventive Screening-Counseling & Management  Alcohol-Tobacco     Alcohol drinks/day: <1     Alcohol type: beer and liquor occ.     Smoking Status: never     Passive Smoke Exposure: no  Caffeine-Diet-Exercise     Caffeine use/day: 2     Does Patient Exercise: yes     Type of exercise: walks     Exercise (avg: min/session): <30     Times/week: <3  Current Problems (verified): 1)  Acute Maxillary Sinusitis  (ICD-461.0) 2)  Dm  (ICD-250.00) 3)  Preventive Health Care  (ICD-V70.0) 4)  Leg Pain, Right  (ICD-729.5) 5)  Acute Bronchitis  (ICD-466.0) 6)  External Hemorrhoids Without Mention Comp  (ICD-455.3) 7)  Neck Pain  (ICD-723.1) 8)  Elevated Blood Pressure  (ICD-796.2) 9)  Low Back Pain  (ICD-724.2) 10)  Hyperlipidemia  (ICD-272.4) 11)  HIV Disease  (ICD-042)  Current Medications (verified): 1)  Anusol-Hc 25 Mg Supp (Hydrocortisone Acetate) .... Insert One Suppository  Rectally Each Day 2)  Acyclovir 200 Mg Caps (Acyclovir) .... Two Caps Two Times A Day 3)  Lipitor 40 Mg Tabs (Atorvastatin Calcium) .... One Daily 4)  Zetia 10 Mg Tabs (Ezetimibe) .... One Daily 5)  Naproxen Dr 500 Mg Tbec (Naproxen) .... One Two Times A Day 6)  Combivir 150-300 Mg Tabs (Lamivudine-Zidovudine) .... One Two Times A Day 7)  Viracept 625 Mg Tabs (Nelfinavir Mesylate) .... Two Tabs Two Times A Day With Meals 8)  Flexeril 10 Mg Tabs (Cyclobenzaprine Hcl) .... Take 1 Tablet By Mouth Every 8 Hours As Needed 9)  Vicodin 5-500 Mg Tabs (Hydrocodone-Acetaminophen) .... Take 1 Tablet By Mouth Every 6 Hours As Needed 10)  Celebrex 100 Mg Caps (Celecoxib) .... Take 1 Tablet By Mouth Two Times A Day 11)  Glucotrol Xl 10 Mg Xr24h-Tab (Glipizide) .... Take 1 Tablet By Mouth Once A Day 12)  Clotrimazole 1 % Crea (Clotrimazole) .... Apply Two Times A Day 13)  Metformin Hcl 500 Mg Tabs (Metformin Hcl) .... Take 1 Tablet By Mouth Two Times A Day 14)  Vicodin 5-500 Mg Tabs (Hydrocodone-Acetaminophen) .... Take 1 Tablet By Mouth Every 8 Hours As Needed  Allergies (verified): No Known Drug Allergies  Review of Systems  The patient denies anorexia, fever, melena, and hematochezia.     Physical Exam  General:  alert, well-developed, well-nourished, and well-hydrated.   Head:  normocephalic and atraumatic.   Abdomen:  soft and non-tender.      Impression & Recommendations:  Problem # 1:  HIV DISEASE (ICD-042) Pt has f/u appt next week. Diagnostics Reviewed:  CD4: 1166 (09/04/2008)   WBC: 5.2 (03/11/2009)   Hgb: 13.4 (03/11/2009)   HCT: 40.5 (03/11/2009)   Platelets: 212 (03/11/2009) HIV-1 RNA: <48 copies/mL (03/11/2009)   HBSAg: NEG (11/27/2007)  Problem # 2:  ABDOMINAL PAIN RIGHT LOWER QUADRANT (ICD-789.03) resolving most likely secondary to something he ate. call if returns bland diet  Other Orders: T-CD4SP ([facility]) (CD4SP) T-HIV Viral Load [phone]) T-Comprehensive  Metabolic Panel [phone]) T-RPR (Syphilis) ([phone]) Est. Patient Level III ([address])

## 2010-07-01 NOTE — Progress Notes (Signed)
Summary: problems with meds  Phone Note Call from Patient   Caller: Patient Summary of Call: Pt. called, the Glipizide ER is not on the $4.00 list at [facility] only the regular Glipizide 10 mg.  Can this med. be changed?  Also pt. wants cholesterol checked at his next lab visit. Initial call taken by: Wendall Mola CMA Duncan Dull),  March 02, 2010 12:05 PM  Follow-up for Phone Call        yes Follow-up by: Yisroel Ramming MD,  March 02, 2010 3:20 PM    New/Updated Medications: GLIPIZIDE 10 MG TABS (GLIPIZIDE) Take 1 tablet by mouth once a day Prescriptions: GLIPIZIDE 10 MG TABS (GLIPIZIDE) Take 1 tablet by mouth once a day  #30 x 5   Entered by:   Wendall Mola CMA ( AAMA)   Authorized by:   Yisroel Ramming MD   Signed by:   Wendall Mola CMA ( AAMA) on 03/02/2010   Method used:   Electronically to        [facility] [phone]* (retail)       [address]       [place], [place]  [address]       Ph: [phone]       Fax: [phone]   RxID:   [id number]  Glipizide ER changed to regular Glipizide Wendall Mola CMA ( AAMA)  March 02, 2010 3:25 PM

## 2010-07-01 NOTE — Miscellaneous (Signed)
Summary: Office Visit ([facility])    Vital Signs:  Patient profile:   50 year old male Weight:      201.8 pounds Temp:     98.2 degrees F oral Pulse rate:   84 / minute Pulse rhythm:   regular Resp:     18 per minute BP sitting:   113 / 72  (left arm)  Vitals Entered By: Sharen Heck RN (June 15, 2009 2:34 PM) CC: f/u 05/ reports going to Urgent Care today for left hip sciatica Is Patient Diabetic? No Pain Assessment Patient in pain? yes     Location: left hip and leg Intensity: 6 Type: sharp  Does patient need assistance? Functional Status Self care Ambulation Normal   CC:  f/u 05/ reports going to Urgent Care today for left hip sciatica.  History of Present Illness: Pt c/o left hip pain.  He went to the ED and they gave him prednisone and NSAIDs.  He wanted a x-ray but they did not feel that it was needed.  He states that he is in a lot of pain. He continues to treat his DM with diet alone.  Preventive Screening-Counseling & Management  Alcohol-Tobacco     Alcohol drinks/day: <1     Alcohol type: beer and liquor occ.     Smoking Status: never     Passive Smoke Exposure: no  Caffeine-Diet-Exercise     Caffeine use/day: 2     Does Patient Exercise: yes     Type of exercise: walks     Exercise (avg: min/session): <30     Times/week: <3  Current Medications (verified): 1)  Anusol-Hc 25 Mg Supp (Hydrocortisone Acetate) .... Insert One Suppository Rectally Each Day 2)  Acyclovir 200 Mg Caps (Acyclovir) .... Two Caps Two Times A Day 3)  Lipitor 40 Mg Tabs (Atorvastatin Calcium) .... One Daily 4)  Zetia 10 Mg Tabs (Ezetimibe) .... One Daily 5)  Naproxen Dr 500 Mg Tbec (Naproxen) .... One Two Times A Day 6)  Combivir 150-300 Mg Tabs (Lamivudine-Zidovudine) .... One Two Times A Day 7)  Viracept 625 Mg Tabs (Nelfinavir Mesylate) .... Two Tabs Two Times A Day With Meals 8)  Flexeril 10 Mg Tabs (Cyclobenzaprine Hcl) .... Take 1 Tablet By Mouth Every 8 Hours  As Needed 9)  Vicodin 5-500 Mg Tabs (Hydrocodone-Acetaminophen) .... Take 1 Tablet By Mouth Every 6 Hours As Needed 10)  Celebrex 100 Mg Caps (Celecoxib) .... Take 1 Tablet By Mouth Two Times A Day 11)  Glucotrol Xl 10 Mg Xr24h-Tab (Glipizide) .... Take 1 Tablet By Mouth Once A Day 12)  Clotrimazole 1 % Crea (Clotrimazole) .... Apply Two Times A Day 13)  Metformin Hcl 500 Mg Tabs (Metformin Hcl) .... Take 1 Tablet By Mouth Two Times A Day 14)  Vicodin 5-500 Mg Tabs (Hydrocodone-Acetaminophen) .... Take 1 Tablet By Mouth Every 8 Hours As Needed  Allergies: No Known Drug Allergies   Review of Systems  The patient denies fever and chest pain.      Impression & Recommendations:  Problem # 1:  HIP PAIN, LEFT (ICD-719.45) wil obtain a x-ray of his hip.  Problem # 2:  HIV DISEASE (ICD-042) Will obtain labs Diagnostics Reviewed:  CD4: 1166 (09/04/2008)   WBC: 5.2 (03/11/2009)   Hgb: 13.4 (03/11/2009)   HCT: 40.5 (03/11/2009)   Platelets: 212 (03/11/2009) HIV-1 RNA: <48 copies/mL (03/11/2009)   HBSAg: NEG (11/27/2007)

## 2010-07-01 NOTE — Assessment & Plan Note (Signed)
Summary: F/U/VS   CC:  follow-up visit and lab work.  History of Present Illness: Pt here to get labs.  He had some back pain the other day afer cutting the grass. He feels better now.  Preventive Screening-Counseling & Management  Alcohol-Tobacco     Alcohol drinks/day: <1     Alcohol type: beer and liquor occ.     Smoking Status: never     Passive Smoke Exposure: no  Caffeine-Diet-Exercise     Caffeine use/day: 0     Does Patient Exercise: yes     Type of exercise: walks     Exercise (avg: min/session): <30     Times/week: <3  Hep-HIV-STD-Contraception     Sun Exposure-Excessive: occasionally  Safety-Violence-Falls     Seat Belt Use: 100      Sexual History:  n/a.        Drug Use:  no.     Updated Prior Medication List: ANUSOL-HC 25 MG SUPP (HYDROCORTISONE ACETATE) insert one suppository rectally each day ACYCLOVIR 200 MG CAPS (ACYCLOVIR) two caps two times a day LIPITOR 40 MG TABS (ATORVASTATIN CALCIUM) one daily ZETIA 10 MG TABS (EZETIMIBE) one daily FLEXERIL 10 MG TABS (CYCLOBENZAPRINE HCL) Take 1 tablet by mouth every 8 hours as needed VICODIN 5-500 MG TABS (HYDROCODONE-ACETAMINOPHEN) Take 1 tablet by mouth every 6 hours as needed CLOTRIMAZOLE 1 % CREA (CLOTRIMAZOLE) apply two times a day VICODIN 5-500 MG TABS (HYDROCODONE-ACETAMINOPHEN) Take 1 tablet by mouth every 8 hours as needed COMBIVIR 150-300 MG TABS (LAMIVUDINE-ZIDOVUDINE) Take 1 tablet by mouth two times a day VIRACEPT 625 MG TABS (NELFINAVIR MESYLATE) Take 2 tablets two times a day  Current Allergies (reviewed today): No known allergies  Past History:  Past Medical History: Last updated: 07/17/2008 HIV disease Hyperlipidemia Low back pain  Social History: Sexual History:  n/a  Review of Systems  The patient denies anorexia, fever, and weight loss.    Vital Signs:  Patient profile:   50 year old male Height:      71 inches (180.34 cm) Weight:      212.8 pounds (96.73 kg) BMI:      29.79 Temp:     98.7 degrees F (37.06 degrees C) oral Pulse rate:   83 / minute BP sitting:   139 / 84  (right arm)  Vitals Entered By: Wendall Mola CMA Duncan Dull) (September 24, 2009 2:23 PM) CC: follow-up visit and lab work Is Patient Diabetic? Yes Did you bring your meter with you today? No Pain Assessment Patient in pain? no      Nutritional Status BMI of 25 - 29 = overweight Nutritional Status Detail appetite "good"  Does patient need assistance? Functional Status Self care Ambulation Normal Comments pt. missed one dose of meds since last visit   Physical Exam  General:  alert, well-developed, well-nourished, and well-hydrated.   Head:  normocephalic and atraumatic.   Lungs:  normal breath sounds.     Impression & Recommendations:  Problem # 1:  HIV DISEASE (ICD-042) Will obtain labs today and have pt return in 2 weeks. He is considering changing his regimen to [place] and [place] to avoid the lipid issues and body habitus changes. Orders: Est. Patient Level IV ([address]) T-CBC w/Diff [phone]) T-CD4SP [facility]) (CD4SP) T-Comprehensive Metabolic Panel [phone]) T-HIV Viral Load [phone]) T-Lipid Profile [phone])  Diagnostics Reviewed:  CD4: 1166 (09/04/2008)   WBC: 5.2 (03/11/2009)   Hgb: 13.4 (03/11/2009)   HCT: 40.5 (03/11/2009)   Platelets: 212 (03/11/2009)  HIV-1 RNA: <48 copies/mL (06/15/2009)   HBSAg: NEG (11/27/2007)  Problem # 2:  DM (ICD-250.00) pt currently treating it with diet  Problem # 3:  HYPERLIPIDEMIA (ICD-272.4) check lipid panel. His updated medication list for this problem includes:    Lipitor 40 Mg Tabs (Atorvastatin calcium) ..... One daily    Zetia 10 Mg Tabs (Ezetimibe) ..... One daily  Patient Instructions: 1)  Please schedule a follow-up appointment in 2 weeks.

## 2010-07-01 NOTE — Letter (Signed)
Summary: Benjamin Hunter: Income Vericfication  Benjamin Hunter: Income Vericfication   Imported By: Florinda Marker 11/12/2009 16:28:07  _____________________________________________________________________  External Attachment:    Type:   Image     Comment:   External Document

## 2010-07-01 NOTE — Progress Notes (Signed)
Summary: [place] [facility] rxes arrived  Phone Note Outgoing Call   Call placed by: Jennet Maduro RN,  June 02, 2010 4:52 PM Call placed to: Patient Action Taken: Assistance medications ready for pick up Summary of Call: Pt notified to pick up rxes. Jennet Maduro RN  June 02, 2010 4:53 PM     Prescriptions: VIRACEPT 625 MG TABS (NELFINAVIR MESYLATE) Take 2 tablets two times a day  #120 x 0   Entered by:   Jennet Maduro RN   Authorized by:   Yisroel Ramming MD   Signed by:   Jennet Maduro RN on 06/02/2010   Method used:   Samples Given   RxID:   [id number] COMBIVIR 150-300 MG TABS (LAMIVUDINE-ZIDOVUDINE) Take 1 tablet by mouth two times a day  #60 x 0   Entered by:   Jennet Maduro RN   Authorized by:   Yisroel Ramming MD   Signed by:   Jennet Maduro RN on 06/02/2010   Method used:   Samples Given   RxID:   [id number]   Appended Document: [place] [facility] rxes arrived pt. picked up ADAP meds

## 2010-07-01 NOTE — Progress Notes (Signed)
Summary: med refills  Phone Note Refill Request Message from:  Pharmacy on September 11, 2009 11:47 AM  Refills Requested: Medication #1:  ZETIA 10 MG TABS one daily   Dosage confirmed as above?Dosage Confirmed   Brand Name Necessary? No   Supply Requested: 1 month  Medication #2:  LIPITOR 40 MG TABS one daily   Dosage confirmed as above?Dosage Confirmed   Brand Name Necessary? No   Supply Requested: 1 month Burna Mortimer at [facility] patient has requested refills for these meds.   Method Requested: Telephone to Pharmacy Next Appointment Scheduled: seen 08/27/09 Initial call taken by: Sharen Heck RN,  September 11, 2009 11:52 AM  Follow-up for Phone Call        ok x 6 Follow-up by: Yisroel Ramming MD,  September 11, 2009 2:17 PM    Prescriptions: LIPITOR 40 MG TABS (ATORVASTATIN CALCIUM) one daily  #30 x 6   Entered by:   Paulo Fruit  BS,CPht II,MPH   Authorized by:   Yisroel Ramming MD   Signed by:   Paulo Fruit  BS,CPht II,MPH on 09/11/2009   Method used:   Telephoned to ...       [facility] (retail)       [address] [place], [place]  [address]       Ph: [phone]       Fax: [phone]   RxID:   [id number] ZETIA 10 MG TABS (EZETIMIBE) one daily  #30 x 6   Entered by:   Paulo Fruit  BS,CPht II,MPH   Authorized by:   Yisroel Ramming MD   Signed by:   Paulo Fruit  BS,CPht II,MPH on 09/11/2009   Method used:   Telephoned to ...       [facility] (retail)       [address] [place], [place]  [address]       Ph: [phone]       Fax: [phone]   RxID:   [id number]  Paulo Fruit  BS,CPht II,MPH  September 11, 2009 3:07 PM

## 2010-07-01 NOTE — Progress Notes (Signed)
Summary: ADAP- Viracept & Combivir  Phone Note Outgoing Call   Summary of Call: [facility] Medication(s) Combivir, Viracept  have arrived.  Left message or spoke to patient advising them they were ready for pickup.    Initial call taken by: Altamease Oiler,  March 10, 2010 10:41 AM

## 2010-07-01 NOTE — Assessment & Plan Note (Signed)
Summary: 2wk f/u/vs   CC:  2 week follow up.  History of Present Illness: Pt here to get lab results. He re-started his lipitor and zetia.  He would like to get all of his care here if we can get his lipitor and zetia here.  Preventive Screening-Counseling & Management  Alcohol-Tobacco     Alcohol drinks/day: 2     Alcohol type: beer and liquor occ.     Smoking Status: never     Passive Smoke Exposure: no  Caffeine-Diet-Exercise     Caffeine use/day: occasional     Does Patient Exercise: yes     Type of exercise: walks     Exercise (avg: min/session): <30     Times/week: <3  Hep-HIV-STD-Contraception     Sun Exposure-Excessive: occasionally  Safety-Violence-Falls     Seat Belt Use: 100      Sexual History:  n/a.        Drug Use:  no.    Comments: pt. given condoms   Updated Prior Medication List: ACYCLOVIR 200 MG CAPS (ACYCLOVIR) two caps two times a day LIPITOR 40 MG TABS (ATORVASTATIN CALCIUM) one daily ZETIA 10 MG TABS (EZETIMIBE) one daily CLOTRIMAZOLE 1 % CREA (CLOTRIMAZOLE) apply two times a day COMBIVIR 150-300 MG TABS (LAMIVUDINE-ZIDOVUDINE) Take 1 tablet by mouth two times a day VIRACEPT 625 MG TABS (NELFINAVIR MESYLATE) Take 2 tablets two times a day  Current Allergies (reviewed today): No known allergies  Past History:  Past Medical History: Last updated: 07/17/2008 HIV disease Hyperlipidemia Low back pain  Review of Systems  The patient denies anorexia, fever, and weight loss.    Vital Signs:  Patient profile:   50 year old male Height:      71 inches (180.34 cm) Weight:      209.0 pounds (95.00 kg) BMI:     29.25 Temp:     98.5 degrees F (36.94 degrees C) oral Pulse rate:   71 / minute BP sitting:   129 / 77  (left arm)  Vitals Entered By: Wendall Mola CMA Duncan Dull) (Oct 08, 2009 2:51 PM) CC: 2 week follow up Is Patient Diabetic? Yes Did you bring your meter with you today? No Pain Assessment Patient in pain? no       Nutritional Status BMI of 25 - 29 = overweight Nutritional Status Detail appetite "fine"  Does patient need assistance? Functional Status Self care Ambulation Normal Comments no missed doses of meds per patient   Physical Exam  General:  alert, well-developed, well-nourished, and well-hydrated.   Head:  normocephalic and atraumatic.   Mouth:  pharynx pink and moist.   Lungs:  normal breath sounds.      Impression & Recommendations:  Problem # 1:  HIV DISEASE (ICD-042) Pt to continue current meds and f/u in 3 months. Orders: Est. Patient Level IV ([address])  Diagnostics Reviewed:  CD4: 710 (09/25/2009)   WBC: 4.9 (09/24/2009)   Hgb: 13.3 (09/24/2009)   HCT: 40.0 (09/24/2009)   Platelets: 214 (09/24/2009) HIV-1 RNA: <48 copies/mL (09/24/2009)   HBSAg: NEG (11/27/2007)  Problem # 2:  HYPERLIPIDEMIA (ICD-272.4) Back on meds.  Re-check lipid panel in 3 months. His updated medication list for this problem includes:    Lipitor 40 Mg Tabs (Atorvastatin calcium) ..... One daily    Zetia 10 Mg Tabs (Ezetimibe) ..... One daily  Problem # 3:  DM (ICD-250.00) treated with diet and exercise. Check HgbA1c. Orders: T-Hgb A1C (in-house) ([id number])  Patient Instructions: 1)  Please  schedule a follow-up appointment in 3 months.  Appended Document: 2wk f/u/vs   Laboratory Results   Blood Tests   Date/Time Received: Mariea Clonts  Oct 08, 2009 4:17 PM  Date/Time Reported: Mariea Clonts  Oct 08, 2009 4:17 PM   HGBA1C: 5.8%   (Normal Range: Non-Diabetic - 3-6%   Control Diabetic - 6-8%)  Comments: Mariea Clonts  Oct 08, 2009 4:23 PM

## 2010-07-01 NOTE — Letter (Signed)
Summary: Juanell Fairly: Income Verification  Juanell Fairly: Income Verification   Imported By: Florinda Marker 10/13/2009 10:53:17  _____________________________________________________________________  External Attachment:    Type:   Image     Comment:   External Document

## 2010-07-01 NOTE — Progress Notes (Signed)
Summary: pt. can not afford meds  Phone Note Call from Patient   Caller: Patient Summary of Call: Pt. said he can not afford Lipitor or Lopid.  I will speak to Lyla Son to see if there is a Patient Assistance program for these meds. Initial call taken by: Wendall Mola CMA [facility]),  May 13, 2010 11:35 AM

## 2010-07-01 NOTE — Progress Notes (Signed)
Summary: [facility]AP rxes ready for pick-up  Phone Note Outgoing Call   Call placed by: Jennet Maduro RN,  May 03, 2010 3:29 PM Call placed to: Patient Action Taken: Assistance medications ready for pick up Summary of Call: ADAP rxes arrived.  Pt. notified.  Jennet Maduro RN  May 03, 2010 3:30 PM     Prescriptions: VIRACEPT 625 MG TABS (NELFINAVIR MESYLATE) Take 2 tablets two times a day  #120 x 0   Entered by:   Jennet Maduro RN   Authorized by:   Yisroel Ramming MD   Signed by:   Jennet Maduro RN on 05/03/2010   Method used:   Samples Given   RxID:   [id number] COMBIVIR 150-300 MG TABS (LAMIVUDINE-ZIDOVUDINE) Take 1 tablet by mouth two times a day  #60 x 0   Entered by:   Jennet Maduro RN   Authorized by:   Yisroel Ramming MD   Signed by:   Jennet Maduro RN on 05/03/2010   Method used:   Samples Given   RxID:   [id number]   Appended Document: [facility] rxes ready for pick-up pt. picked up ADAP meds 05/07/10

## 2010-07-01 NOTE — Progress Notes (Signed)
Summary: request for med change  Phone Note Other Incoming   Caller: patient Summary of Call: Patient called 08/17/09 asking if he can take his Atripla in the mornings after he had already talked with Burna Mortimer extensively in the pharmacy. After consulting the pharmacist pt. advised he can take it in the mornings, though the preference is at bedtime. Pt. wants to keep to his morning regimen. Pt. came to [facility] today and states Atripla makes his heart race and makes him dizzy and he is switching back to his previous HIV meds.  He states he took his first dose today and it is also giving him abdominal cramps.Pt. advised will advise Dr. Philipp Deputy and will need to get an order to change back to his previous meds. Pt. get his meds through [facility]. 08/19/09 ML for pt. that MD said okay.[facility] will reorder previous 042 meds from [facility]. Initial call taken by: Sharen Heck RN,  August 18, 2009 4:07 PM  Follow-up for Phone Call        ok Follow-up by: Yisroel Ramming MD,  August 19, 2009 9:19 AM

## 2010-07-01 NOTE — Miscellaneous (Signed)
Summary: Office Visit ([facility])    Vital Signs:  Patient profile:   50 year old male Weight:      204 pounds Temp:     98.9 degrees F oral Pulse rate:   86 / minute Pulse rhythm:   regular Resp:     22 per minute BP sitting:   143 / 88  (left arm)  Vitals Entered By: Sharen Heck RN (August 06, 2009 3:42 PM) CC: f/u 05, recent loose stools, sore throat for several days, seen at Urgent Care 4 days ago and strep negative, post nasal drip, taking Fexofenadine, Gauifensin, Tussin CBG Result 154  Does patient need assistance? Functional Status Self care Ambulation Normal Comments swelling under both arms with right arm slightly larger.   CC:  f/u 05, recent loose stools, sore throat for several days, seen at Urgent Care 4 days ago and strep negative, post nasal drip, taking Fexofenadine, Gauifensin, and Tussin.  History of Present Illness: Pt went to ED for URI. W/U negative. He has had some watery diarrhea for the last 4 days. No abdominal pain, fever,naudea or vomiting. He is also concerned about changes in body habitus. He has gained weight in abdomen and lost weight in extremeties.   Current Medications (verified): 1)  Anusol-Hc 25 Mg Supp (Hydrocortisone Acetate) .... Insert One Suppository Rectally Each Day 2)  Acyclovir 200 Mg Caps (Acyclovir) .... Two Caps Two Times A Day 3)  Lipitor 40 Mg Tabs (Atorvastatin Calcium) .... One Daily 4)  Zetia 10 Mg Tabs (Ezetimibe) .... One Daily 5)  Naproxen Dr 500 Mg Tbec (Naproxen) .... One Two Times A Day 6)  Flexeril 10 Mg Tabs (Cyclobenzaprine Hcl) .... Take 1 Tablet By Mouth Every 8 Hours As Needed 7)  Vicodin 5-500 Mg Tabs (Hydrocodone-Acetaminophen) .... Take 1 Tablet By Mouth Every 6 Hours As Needed 8)  Celebrex 100 Mg Caps (Celecoxib) .... Take 1 Tablet By Mouth Two Times A Day 9)  Glucotrol Xl 10 Mg Xr24h-Tab (Glipizide) .... Take 1 Tablet By Mouth Once A Day 10)  Clotrimazole 1 % Crea (Clotrimazole) .... Apply Two Times  A Day 11)  Metformin Hcl 500 Mg Tabs (Metformin Hcl) .... Take 1 Tablet By Mouth Two Times A Day 12)  Vicodin 5-500 Mg Tabs (Hydrocodone-Acetaminophen) .... Take 1 Tablet By Mouth Every 8 Hours As Needed 13)  Atripla 600-200-300 Mg Tabs (Efavirenz-Emtricitab-Tenofovir) .... Take 1 Tablet By Mouth At Bedtime  Allergies (verified): No Known Drug Allergies   Review of Systems  The patient denies anorexia, fever, and weight loss.     Physical Exam  General:  alert, well-developed, well-nourished, and well-hydrated.   Head:  normocephalic and atraumatic.   Mouth:  pharynx pink and moist, no erythema, and no exudates.   Lungs:  normal breath sounds.     Impression & Recommendations:  Problem # 1:  DIARRHEA (ICD-787.91) immodium trial  Problem # 2:  HIV DISEASE (ICD-042) Pt concerned about lipodystrophy. Discussed switching therapy to a regimen that would not cause those changes.  Discussed pros and cons. Pt is agreeable and will switch to Atripla. Potential side effects discussed. He will return in 6 weeks. Diagnostics Reviewed:  CD4: 1166 (09/04/2008)   WBC: 5.2 (03/11/2009)   Hgb: 13.4 (03/11/2009)   HCT: 40.5 (03/11/2009)   Platelets: 212 (03/11/2009) HIV-1 RNA: <48 copies/mL (06/15/2009)   HBSAg: NEG (11/27/2007)  Medications Added to Medication List This Visit: 1)  Atripla 600-200-300 Mg Tabs (Efavirenz-emtricitab-tenofovir) .... Take 1 tablet by mouth  at bedtime   Patient Instructions: 1)  Please schedule a follow-up appointment in 6 weeks. Prescriptions: ATRIPLA 600-200-300 MG TABS (EFAVIRENZ-EMTRICITAB-TENOFOVIR) Take 1 tablet by mouth at bedtime  #30 x 5   Entered and Authorized by:   Yisroel Ramming MD   Signed by:   Yisroel Ramming MD on 08/06/2009   Method used:   Print then Give to Patient   RxID:   [id number]

## 2010-07-01 NOTE — Letter (Signed)
Summary: [facility]  [address]   [place], [place] [address]   Phone: [phone]  Fax: [phone]            11/12/2009   RE: [name] (DOB [birth year])    To Whom It May Concern:  Benjamin Hunter is a patient of mine. In my opinion he is medically disabled due to his multiple medical problems which include, osteoarthritis, degenerative disc disease, bilateral hip and leg pain, diabetes mellitus, hyperlipidemia and HIV infection. If you have any questions please feel free to contact me.  Sincerely,   Yisroel Ramming MD

## 2010-07-01 NOTE — Progress Notes (Signed)
Summary: NCADAP meds arrived for Aug  Phone Note Refill Request      Prescriptions: VIRACEPT 625 MG TABS (NELFINAVIR MESYLATE) Take 2 tablets two times a day  #120 x 0   Entered by:   Paulo Fruit  BS,CPht II,MPH   Authorized by:   Yisroel Ramming MD   Signed by:   Paulo Fruit  BS,CPht II,MPH on 12/29/2009   Method used:   Samples Given   RxID:   [id number] COMBIVIR 150-300 MG TABS (LAMIVUDINE-ZIDOVUDINE) Take 1 tablet by mouth two times a day  #60 x 0   Entered by:   Paulo Fruit  BS,CPht II,MPH   Authorized by:   Yisroel Ramming MD   Signed by:   Paulo Fruit  BS,CPht II,MPH on 12/29/2009   Method used:   Samples Given   RxID:   [id number] ACYCLOVIR 200 MG CAPS (ACYCLOVIR) two caps two times a day  #120 Each x 0   Entered by:   Paulo Fruit  BS,CPht II,MPH   Authorized by:   Yisroel Ramming MD   Signed by:   Paulo Fruit  BS,CPht II,MPH on 12/29/2009   Method used:   Samples Given   RxID:   [id number]  Patient Assist Medication Verification: Medication name: Acyclovir 200mg  RX # [id number] Tech approval:MLD  Patient Assist Medication Verification: Medication name:Viracept 625mg  RX # [id number] Tech approval:MLD  Patient Assist Medication Verification: Medication name:combivir 150/300mg  RX # [id number] Tech approval:MLD Call placed to patient with message that assistance medications are ready for pick-up. Left message for patient to contact Evalee Mutton Dupree  BS,CPht II,MPH  December 29, 2009 2:59 PM

## 2010-07-01 NOTE — Progress Notes (Signed)
Summary: Diabetes Medications  Phone Note Call from Patient   Summary of Call: Pt called in to pharmacy line stating that he needs his glucotrol and metformin refilled.  If you want these refilled he states that his pharmacy is [facility]?  These medications are on the $4.00 list at [facility].  Rob. Initial call taken by: Altamease Oiler,  March 01, 2010 9:03 AM  Follow-up for Phone Call        spoke with pt. and he says his sugar has been running 300 the past few days.  Pt. does not take these medicines every day.  If you want him to start I can call to [facility] on [facility]. Follow-up by: Wendall Mola CMA Duncan Dull),  March 01, 2010 12:54 PM  Additional Follow-up for Phone Call Additional follow up Details #1::        yes, he should start Additional Follow-up by: Yisroel Ramming MD,  March 01, 2010 2:09 PM    New/Updated Medications: METFORMIN HCL 500 MG TABS (METFORMIN HCL) Take 1 tablet by mouth two times a day GLUCOTROL XL 10 MG XR24H-TAB (GLIPIZIDE) Take 1 tablet by mouth once a day Prescriptions: GLUCOTROL XL 10 MG XR24H-TAB (GLIPIZIDE) Take 1 tablet by mouth once a day  #30 x 5   Entered by:   Wendall Mola CMA ( AAMA)   Authorized by:   Yisroel Ramming MD   Signed by:   Wendall Mola CMA ( AAMA) on 03/01/2010   Method used:   Electronically to        [facility] [phone]* (retail)       [address]       [place], [place]  [address]       Ph: [phone]       Fax: [phone]   RxID:   [id number] METFORMIN HCL 500 MG TABS (METFORMIN HCL) Take 1 tablet by mouth two times a day  #60 x 5   Entered by:   Wendall Mola CMA ( AAMA)   Authorized by:   Yisroel Ramming MD   Signed by:   Wendall Mola CMA ( AAMA) on 03/01/2010   Method used:   Electronically to        [facility] [phone]* (retail)       [address]       [place], [place]  [address]       Ph: [phone]       Fax: [phone]   RxID:   [id number]

## 2010-07-01 NOTE — Progress Notes (Signed)
Summary: incorrect lab  Phone Note Other Incoming   Caller: Montez Morita at [facility] [facility]: Received a call from [facility] lab that this pts. CD4 was entered incorrectly.  Correct lab to follow, please ignore until further notice Initial call taken by: Wendall Mola CMA Duncan Dull),  April 27, 2010 12:57 PM

## 2010-07-01 NOTE — Progress Notes (Signed)
Summary: Lipitor PAP rx arrived  Phone Note Outgoing Call   Call placed by: Jennet Maduro RN,  May 25, 2010 1:03 PM Call placed to: Patient Action Taken: Assistance medications ready for pick up Summary of Call: Lipitor PAP rx arrived.  Pt. informed.  Will pick up today.  Lipitor 40 mg Take 1 tablet by mouth once a day  # 90 tablets Exp. date - Sept. 2014 Lot # - [id number]  Jennet Maduro RN  May 25, 2010 1:05 PM

## 2010-07-01 NOTE — Progress Notes (Signed)
Summary: ncadap meds arrived for Aug  Phone Note Refill Request      Prescriptions: VIRACEPT 625 MG TABS (NELFINAVIR MESYLATE) Take 2 tablets two times a day  #120 x 0   Entered by:   Paulo Fruit  BS,CPht II,MPH   Authorized by:   Yisroel Ramming MD   Signed by:   Paulo Fruit  BS,CPht II,MPH on 01/26/2010   Method used:   Samples Given   RxID:   [id number] COMBIVIR 150-300 MG TABS (LAMIVUDINE-ZIDOVUDINE) Take 1 tablet by mouth two times a day  #60 x 0   Entered by:   Paulo Fruit  BS,CPht II,MPH   Authorized by:   Yisroel Ramming MD   Signed by:   Paulo Fruit  BS,CPht II,MPH on 01/26/2010   Method used:   Samples Given   RxID:   [id number]  Patient Assist Medication Verification: Medication name: Milagros Evener 150/300mg  RX # [id number] Tech approval:MLD  Patient Assist Medication Verification: Medication name:Viracept 625mg  RX # [id number] Tech approval:MLD Call placed to patient with message that assistance medications are ready for pick-up. Paulo Fruit  BS,CPht II,MPH  January 26, 2010 4:47 PM

## 2010-07-01 NOTE — Assessment & Plan Note (Signed)
Summary: f/u [mkj]   CC:  follow-up visit, lab results, c/o sorethroat and sinus drainage, and needs diabetic test strips.  History of Present Illness: patient here for follow-up on his lab results.  He has not missed any doses of his HIV medication.  He states his blood sugar has been up and down over the last several months.  He is out of strips for his glucometer and cannot afford them.  He also complains of a cough.  He went to the urgent care last week and was given some amoxicillin and some cough medicine. he denies any fever or chills.  Preventive Screening-Counseling & Management  Alcohol-Tobacco     Alcohol drinks/day: 1     Alcohol type: beer and liquor occ.     Smoking Status: never     Passive Smoke Exposure: no  Caffeine-Diet-Exercise     Caffeine use/day: occasional     Does Patient Exercise: yes     Type of exercise: walks     Exercise (avg: min/session): <30     Times/week: <3  Hep-HIV-STD-Contraception     Sun Exposure-Excessive: occasionally  Safety-Violence-Falls     Seat Belt Use: 100      Sexual History:  n/a.        Drug Use:  no.    Comments: pt. declined condoms   Updated Prior Medication List: LIPITOR 40 MG TABS (ATORVASTATIN CALCIUM) one daily CLOTRIMAZOLE 1 % CREA (CLOTRIMAZOLE) apply two times a day COMBIVIR 150-300 MG TABS (LAMIVUDINE-ZIDOVUDINE) Take 1 tablet by mouth two times a day VIRACEPT 625 MG TABS (NELFINAVIR MESYLATE) Take 2 tablets two times a day METFORMIN HCL 500 MG TABS (METFORMIN HCL) Take 1 tablet by mouth two times a day GLIPIZIDE 10 MG TABS (GLIPIZIDE) Take 1 tablet by mouth once a day LOPID 600 MG TABS (GEMFIBROZIL) take one tablet twice a day 30 minutes before meals  Current Allergies (reviewed today): No known allergies  Past History:  Past Medical History: Last updated: 07/17/2008 HIV disease Hyperlipidemia Low back pain  Review of Systems  The patient denies anorexia, fever, and weight loss.    Vital  Signs:  Patient profile:   51 year old male Height:      71 inches (180.34 cm) Weight:      206.0 pounds (93.64 kg) BMI:     28.84 Temp:     98.1 degrees F (36.72 degrees C) oral Pulse rate:   87 / minute BP sitting:   131 / 77  (left arm)  Vitals Entered By: Wendall Mola CMA Duncan Dull) (May 11, 2010 2:27 PM) CC: follow-up visit, lab results, c/o sorethroat and sinus drainage, needs diabetic test strips Is Patient Diabetic? Yes Did you bring your meter with you today? No Pain Assessment Patient in pain? no      Nutritional Status BMI of 25 - 29 = overweight Nutritional Status Detail appetite "fine"  Have you ever been in a relationship where you felt threatened, hurt or afraid?No   Does patient need assistance? Functional Status Self care Ambulation Normal Comments no missed doses of HAART meds per pt.   Physical Exam  General:  alert, well-developed, well-nourished, and well-hydrated.   Head:  normocephalic and atraumatic.   Mouth:  pharynx pink and moist, no erythema, and no exudates.   Lungs:  normal breath sounds.     Impression & Recommendations:  Problem # 1:  HIV DISEASE (ICD-042) Pt.s most recent CD4ct was 700 and VL <20 .  Pt instructed  to continue the current antiretroviral regimen.  Pt encouraged to take medication regularly and not miss doeses.  Pt will f/u in 3 months for repeat blood work and will see me 2 weeks later.  Diagnostics Reviewed:  HIV: CDC-defined AIDS (04/20/2010)   CD4: 700 (04/28/2010)   WBC: 5.2 (04/26/2010)   Hgb: 13.0 (04/26/2010)   HCT: 39.3 (04/26/2010)   Platelets: 219 (04/26/2010) HIV-1 RNA: <20 copies/mL (04/26/2010)   HBSAg: NEG (11/27/2007)  Problem # 2:  ACUTE BRONCHITIS (ICD-466.0) Assessment: Improved  Problem # 3:  DM (ICD-250.00) efer to DM educator for strips His updated medication list for this problem includes:    Metformin Hcl 500 Mg Tabs (Metformin hcl) .[name]... Take 1 tablet by mouth two times a day     Glipizide 10 Mg Tabs (Glipizide) .[name]... Take 1 tablet by mouth once a day  Problem # 4:  HYPERLIPIDEMIA (ICD-272.4) add lopid and stop zetia re-check labs in 6 weeks The following medications were removed from the medication list:    Zetia 10 Mg Tabs (Ezetimibe) ..... One daily His updated medication list for this problem includes:    Lipitor 40 Mg Tabs (Atorvastatin calcium) ..... One daily    Lopid 600 Mg Tabs (Gemfibrozil) .[name]... Take one tablet twice a day 30 minutes before meals  Medications Added to Medication List This Visit: 1)  Lopid 600 Mg Tabs (Gemfibrozil) .... Take one tablet twice a day 30 minutes before meals  Other Orders: Est. Patient Level III ([address]) TB Skin Test [phone]) Admin 1st Vaccine ([address]) Future Orders: T-CD4SP ([facility]) (CD4SP) ... 08/09/2010 T-HIV Viral Load [phone]) ... 08/09/2010 T-Comprehensive Metabolic Panel [phone]) ... 08/09/2010 T-CBC w/Diff ([id number]) ... 08/09/2010 T-Lipid Profile [phone]) ... 08/09/2010  Patient Instructions: 1)  Please schedule a follow-up appointment in 3 months, 2 weeks after labs.  Prescriptions: LOPID 600 MG TABS (GEMFIBROZIL) take one tablet twice a day 30 minutes before meals  #60 x 0   Entered and Authorized by:   Yisroel Ramming MD   Signed by:   Yisroel Ramming MD on 05/11/2010   Method used:   Print then Give to Patient   RxID:   [id number]      Immunizations Administered:  PPD Skin Test:    Vaccine Type: PPD    Site: left forearm    Mfr: Sanofi Pasteur    Dose: 0.1 ml    Route: ID    Given by: Wendall Mola CMA ( AAMA)    Exp. Date: 04/01/2011    Lot #: [id number]

## 2010-07-01 NOTE — Miscellaneous (Signed)
Summary: Office Visit ([facility])    Vital Signs:  Patient profile:   50 year old male Weight:      204 pounds Temp:     97.8 degrees F oral Pulse rate:   83 / minute Pulse rhythm:   regular Resp:     20 per minute BP sitting:   147 / 82  (left arm)  Vitals Entered By: Sharen Heck RN (August 27, 2009 3:30 PM) CC: f/u 05, wants to discuss possible 042 meds for the future Pain Assessment Patient in pain? no       Does patient need assistance? Functional Status Self care Ambulation Normal   CC:  f/u 05 and wants to discuss possible 042 meds for the future.  History of Present Illness: Pt had side effect to Atripla - felt off balance and then had heart palpitations.  He stopped it and is now back on his original meds Viracept and Combivir. He is still concerned about his body habitus and is considereing switching to a new regimen. Will wait transfer to new clinic.  Preventive Screening-Counseling & Management  Alcohol-Tobacco     Alcohol drinks/day: <1     Alcohol type: beer and liquor occ.     Smoking Status: never     Passive Smoke Exposure: no  Caffeine-Diet-Exercise     Caffeine use/day: 2     Does Patient Exercise: yes     Type of exercise: walks     Exercise (avg: min/session): <30     Times/week: <3  Current Problems (verified): 1)  Diarrhea  (ICD-787.91) 2)  Pharyngitis, Streptococcal  (ICD-034.0) 3)  Hip Pain, Left  (ICD-719.45) 4)  Abdominal Pain Right Lower Quadrant  (ICD-789.03) 5)  Acute Maxillary Sinusitis  (ICD-461.0) 6)  Dm  (ICD-250.00) 7)  Preventive Health Care  (ICD-V70.0) 8)  Leg Pain, Right  (ICD-729.5) 9)  Acute Bronchitis  (ICD-466.0) 10)  External Hemorrhoids Without Mention Comp  (ICD-455.3) 11)  Neck Pain  (ICD-723.1) 12)  Elevated Blood Pressure  (ICD-796.2) 13)  Low Back Pain  (ICD-724.2) 14)  Hyperlipidemia  (ICD-272.4) 15)  HIV Disease  (ICD-042)  Current Medications (verified): 1)  Anusol-Hc 25 Mg Supp (Hydrocortisone  Acetate) .... Insert One Suppository Rectally Each Day 2)  Acyclovir 200 Mg Caps (Acyclovir) .... Two Caps Two Times A Day 3)  Lipitor 40 Mg Tabs (Atorvastatin Calcium) .... One Daily 4)  Zetia 10 Mg Tabs (Ezetimibe) .... One Daily 5)  Naproxen Dr 500 Mg Tbec (Naproxen) .... One Two Times A Day 6)  Flexeril 10 Mg Tabs (Cyclobenzaprine Hcl) .... Take 1 Tablet By Mouth Every 8 Hours As Needed 7)  Vicodin 5-500 Mg Tabs (Hydrocodone-Acetaminophen) .... Take 1 Tablet By Mouth Every 6 Hours As Needed 8)  Celebrex 100 Mg Caps (Celecoxib) .... Take 1 Tablet By Mouth Two Times A Day 9)  Clotrimazole 1 % Crea (Clotrimazole) .... Apply Two Times A Day 10)  Vicodin 5-500 Mg Tabs (Hydrocodone-Acetaminophen) .... Take 1 Tablet By Mouth Every 8 Hours As Needed 11)  Combivir 150-300 Mg Tabs (Lamivudine-Zidovudine) .... Take 1 Tablet By Mouth Two Times A Day 12)  Viracept 625 Mg Tabs (Nelfinavir Mesylate) .... Take 2 Tablets Two Times A Day  Allergies: No Known Drug Allergies   Review of Systems  The patient denies anorexia, fever, and weight loss.     Physical Exam  General:  alert, well-developed, well-nourished, and well-hydrated.   Head:  normocephalic and atraumatic.   Lungs:  normal breath  sounds.     Impression & Recommendations:  Problem # 1:  HIV DISEASE (ICD-042) Pt will f/u in 4 weeks for labs. Pt back on combivir and viracept but considering alternative treatment options. Diagnostics Reviewed:  CD4: 1166 (09/04/2008)   WBC: 5.2 (03/11/2009)   Hgb: 13.4 (03/11/2009)   HCT: 40.5 (03/11/2009)   Platelets: 212 (03/11/2009) HIV-1 RNA: <48 copies/mL (06/15/2009)   HBSAg: NEG (11/27/2007)  Problem # 2:  DM (ICD-250.00) currently controlled by diet and exercise.  Problem # 3:  HYPERLIPIDEMIA (ICD-272.4) will check lipid panel next lab draw His updated medication list for this problem includes:    Lipitor 40 Mg Tabs (Atorvastatin calcium) ..... One daily    Zetia 10 Mg Tabs (Ezetimibe)  ..... One daily  Labs Reviewed: SGOT: 16 (06/15/2009)   SGPT: 16 (06/15/2009)   HDL:36 (03/11/2009), 43 (11/27/2008)  LDL:See Comment mg/dL (16/02/9603), 540 (98/03/9146)  Chol:260 (03/11/2009), 176 (11/27/2008)  Trig:489 (03/11/2009), 123 (11/27/2008)  Medications Added to Medication List This Visit: 1)  Combivir 150-300 Mg Tabs (Lamivudine-zidovudine) .... Take 1 tablet by mouth two times a day 2)  Viracept 625 Mg Tabs (Nelfinavir mesylate) .... Take 2 tablets two times a day   Patient Instructions: 1)  Please schedule a follow-up appointment in 1 month.

## 2010-07-01 NOTE — Progress Notes (Signed)
Summary: reports pain in bil hips.  Phone Note Outgoing Call   Action Taken: Phone Call Completed Reason for Call: Discuss lab or test results Summary of Call: Patient advised hip x-ray negative. He wants MD to know he is having some pain in both hips now. Initial call taken by: Sharen Heck RN,  June 18, 2009 4:41 PM

## 2010-07-01 NOTE — Miscellaneous (Signed)
  Clinical Lists Changes  Observations: Added new observation of YEARAIDSPOS: 2003  (04/20/2010 15:03) Added new observation of HIV STATUS: CDC-defined AIDS  (04/20/2010 15:03)

## 2010-07-01 NOTE — Miscellaneous (Signed)
Summary: RW Update  Clinical Lists Changes  Observations: Added new observation of DATE1STVISIT: 11/11/2009 (12/23/2009 14:01) Added new observation of RWPARTICIP: Yes (12/23/2009 14:01)

## 2010-07-01 NOTE — Miscellaneous (Signed)
Summary: [facility]  [facility]   Imported By: Florinda Marker 04/13/2010 16:43:40  _____________________________________________________________________  External Attachment:    Type:   Image     Comment:   External Document

## 2010-07-01 NOTE — Assessment & Plan Note (Signed)
Summary: 2WK F/U/VS   CC:  follow-up visit, lab results, and ongoing leg pain.  History of Present Illness: Pt c/o some leg pain last month. It feels ok today. No missed doses of his meds.  Preventive Screening-Counseling & Management  Alcohol-Tobacco     Alcohol drinks/day: 2     Alcohol type: beer and liquor occ.     Smoking Status: never     Passive Smoke Exposure: no  Caffeine-Diet-Exercise     Caffeine use/day: occasional     Does Patient Exercise: yes     Type of exercise: walks     Exercise (avg: min/session): <30     Times/week: <3  Hep-HIV-STD-Contraception     Sun Exposure-Excessive: occasionally  Safety-Violence-Falls     Seat Belt Use: 100      Sexual History:  n/a.        Drug Use:  no.    Comments: pt. declined condoms   Updated Prior Medication List: LIPITOR 40 MG TABS (ATORVASTATIN CALCIUM) one daily ZETIA 10 MG TABS (EZETIMIBE) one daily CLOTRIMAZOLE 1 % CREA (CLOTRIMAZOLE) apply two times a day COMBIVIR 150-300 MG TABS (LAMIVUDINE-ZIDOVUDINE) Take 1 tablet by mouth two times a day VIRACEPT 625 MG TABS (NELFINAVIR MESYLATE) Take 2 tablets two times a day  Current Allergies (reviewed today): No known allergies  Past History:  Past Medical History: Last updated: 07/17/2008 HIV disease Hyperlipidemia Low back pain  Review of Systems  The patient denies anorexia, fever, and weight loss.    Vital Signs:  Patient profile:   50 year old male Height:      71 inches (180.34 cm) Weight:      209.8 pounds (95.36 kg) BMI:     29.37 Temp:     97.5 degrees F (36.39 degrees C) oral Pulse rate:   74 / minute BP sitting:   131 / 81  (right arm)  Vitals Entered By: Wendall Mola CMA Duncan Dull) (January 25, 2010 10:11 AM) CC: follow-up visit, lab results, ongoing leg pain Is Patient Diabetic? Yes Did you bring your meter with you today? No Pain Assessment Patient in pain? yes     Location: legs Intensity: 2 Type: aching Onset of pain   Intermittent Nutritional Status BMI of 25 - 29 = overweight Nutritional Status Detail appetite "fine"  Does patient need assistance? Functional Status Self care Ambulation Normal Comments no missed doses of meds per patient   Physical Exam  General:  alert, well-developed, well-nourished, and well-hydrated.   Head:  normocephalic and atraumatic.   Lungs:  normal breath sounds.          Medication Adherence: 01/25/2010   Adherence to medications reviewed with patient. Counseling to provide adequate adherence provided   Prevention For Positives: 01/25/2010   Safe sex practices discussed with patient. Condoms offered.                             Impression & Recommendations:  Problem # 1:  HIV DISEASE (ICD-042) Pt.s most recent CD4ct was 790 and VL <48 .  Pt instructed to continue the current antiretroviral regimen.  Pt encouraged to take medication regularly and not miss doses.  Pt will f/u in 3 months for repeat blood work and will see me 2 weeks later.  Diagnostics Reviewed:  CD4: 790 (01/13/2010)   WBC: 5.1 (01/12/2010)   Hgb: 12.8 (01/12/2010)   HCT: 39.8 (01/12/2010)   Platelets: 215 (01/12/2010) HIV-1 RNA: <48  copies/mL (01/12/2010)   HBSAg: NEG (11/27/2007)  Problem # 2:  LEG PAIN, RIGHT (ICD-729.5) discussed repeat MRI pt would like to hold off for now due to cost  Problem # 3:  HYPERLIPIDEMIA (ICD-272.4) will re-check lipid panel next visit His updated medication list for this problem includes:    Lipitor 40 Mg Tabs (Atorvastatin calcium) ..... One daily    Zetia 10 Mg Tabs (Ezetimibe) ..... One daily  Other Orders: Est. Patient Level III (41324) Influenza Vaccine NON MCR (40102) Future Orders: T-CD4SP ([facility]) (CD4SP) ... 04/25/2010 T-HIV Viral Load [phone]) ... 04/25/2010 T-Comprehensive Metabolic Panel [id number]) ... 04/25/2010 T-CBC w/Diff ([id number]) ... 04/25/2010 T-Lipid Profile [phone]) ... 04/25/2010  Patient  Instructions: 1)  Please schedule a follow-up appointment in 3 months, 2 weeks after labs.    Immunizations Administered:  Influenza Vaccine # 1:    Vaccine Type: Fluvax Non-MCR    Site: left deltoid    Mfr: Novartis    Dose: 0.5 ml    Route: IM    Given by: Wendall Mola CMA ( AAMA)    Exp. Date: 08/29/2010    Lot #: [id number]    VIS given: 12/21/06 version given January 25, 2010.  Flu Vaccine Consent Questions:    Do you have a history of severe allergic reactions to this vaccine? no    Any prior history of allergic reactions to egg and/or gelatin? no    Do you have a sensitivity to the preservative Thimersol? no    Do you have a past history of Guillan-Barre Syndrome? no    Do you currently have an acute febrile illness? no    Have you ever had a severe reaction to latex? no    Vaccine information given and explained to patient? yes

## 2010-07-01 NOTE — Progress Notes (Signed)
Summary: refill pain med.  Phone Note Call from Patient   Caller: Patient Summary of Call: Pt. called requesting his medical files for disabiltiy hearing, was referred to [facility] and has an appt. with Revonda Standard for 03/09/10.  Also is requesting a refill of Vicodin called to [facility] on [address] Initial call taken by: Wendall Mola CMA Duncan Dull),  March 05, 2010 10:59 AM  Follow-up for Phone Call        ok vicodin # 30 Follow-up by: Yisroel Ramming MD,  March 05, 2010 11:18 AM    New/Updated Medications: VICODIN 5-500 MG TABS (HYDROCODONE-ACETAMINOPHEN) Take 1 tablet by mouth every 8 hours as needed Prescriptions: VICODIN 5-500 MG TABS (HYDROCODONE-ACETAMINOPHEN) Take 1 tablet by mouth every 8 hours as needed  #30 x 0   Entered by:   Wendall Mola CMA ( AAMA)   Authorized by:   Yisroel Ramming MD   Signed by:   Wendall Mola CMA ( AAMA) on 03/05/2010   Method used:   Telephoned to ...       [facility] [address] [phone]* (retail)       [address]       [place], [place]  [address]       Ph: [phone]       Fax: [phone]   RxID:   [id number]

## 2010-07-01 NOTE — Assessment & Plan Note (Signed)
Summary: legs & back pains [mkj]   CC:  pt. c/o bilateral leg and foot pain.  History of Present Illness: Pt concerned about the rash on his feet.  He also c/o bilaterl leg pain.  He is applying for disability and needs a letter.  Preventive Screening-Counseling & Management  Alcohol-Tobacco     Alcohol drinks/day: 2     Alcohol type: beer and liquor occ.     Smoking Status: never     Passive Smoke Exposure: no  Caffeine-Diet-Exercise     Caffeine use/day: occasional     Does Patient Exercise: yes     Type of exercise: walks     Exercise (avg: min/session): <30     Times/week: <3  Hep-HIV-STD-Contraception     Sun Exposure-Excessive: occasionally  Safety-Violence-Falls     Seat Belt Use: 100      Sexual History:  n/a.        Drug Use:  no.    Comments: pt. declined condoms   Updated Prior Medication List: ACYCLOVIR 200 MG CAPS (ACYCLOVIR) two caps two times a day LIPITOR 40 MG TABS (ATORVASTATIN CALCIUM) one daily ZETIA 10 MG TABS (EZETIMIBE) one daily CLOTRIMAZOLE 1 % CREA (CLOTRIMAZOLE) apply two times a day COMBIVIR 150-300 MG TABS (LAMIVUDINE-ZIDOVUDINE) Take 1 tablet by mouth two times a day VIRACEPT 625 MG TABS (NELFINAVIR MESYLATE) Take 2 tablets two times a day  Current Allergies (reviewed today): No known allergies  Past History:  Past Medical History: Last updated: 07/17/2008 HIV disease Hyperlipidemia Low back pain  Review of Systems  The patient denies anorexia, fever, and weight loss.    Vital Signs:  Patient profile:   50 year old male Height:      71 inches Weight:      209.8 pounds (95.36 kg) BMI:     29.37 Temp:     98.1 degrees F (36.72 degrees C) oral Pulse rate:   78 / minute BP sitting:   134 / 877  (right arm)  Vitals Entered By: Wendall Mola CMA Duncan Dull) (November 11, 2009 3:25 PM) CC: pt. c/o bilateral leg and foot pain Is Patient Diabetic? Yes Did you bring your meter with you today? No Pain Assessment Patient in  pain? yes     Location: legs and feet Intensity: 6 Type: aching Onset of pain  Constant Nutritional Status BMI of 25 - 29 = overweight Nutritional Status Detail appetite "good"  Have you ever been in a relationship where you felt threatened, hurt or afraid?No   Does patient need assistance? Functional Status Self care Ambulation Normal Comments one missed dose of HAART meds per patient since last visit   Physical Exam  General:  alert and well-developed.   Head:  normocephalic and atraumatic.   Extremities:  tinea pedis present onychomycosis present   Impression & Recommendations:  Problem # 1:  DERMATOPHYTOSIS OF FOOT (ICD-110.4) clotrimazole cream keep dry His updated medication list for this problem includes:    Clotrimazole 1 % Crea (Clotrimazole) .[name]... Apply two times a day  Problem # 2:  HIV DISEASE (ICD-042) f/u as scheduled will write letter Diagnostics Reviewed:  CD4: 710 (09/25/2009)   WBC: 4.9 (09/24/2009)   Hgb: 13.3 (09/24/2009)   HCT: 40.0 (09/24/2009)   Platelets: 214 (09/24/2009) HIV-1 RNA: <48 copies/mL (09/24/2009)   HBSAg: NEG (11/27/2007) Prescriptions: CLOTRIMAZOLE 1 % CREA (CLOTRIMAZOLE) apply two times a day  #60gm x 2   Entered and Authorized by:   Yisroel Ramming MD  Signed by:   Yisroel Ramming MD on 11/11/2009   Method used:   Print then Give to Patient   RxID:   [id number]

## 2010-07-01 NOTE — Miscellaneous (Signed)
Summary: [facility] [employer]  [facility] [employer] [facility]   Imported By: Florinda Marker 01/04/2010 14:41:48  _____________________________________________________________________  External Attachment:    Type:   Image     Comment:   External Document

## 2010-07-01 NOTE — Miscellaneous (Signed)
Summary: Office Visit ([facility])    Vital Signs:  Patient profile:   50 year old male Weight:      206 pounds Temp:     98.3 degrees F oral Pulse rate:   86 / minute Pulse rhythm:   regular Resp:     20 per minute BP sitting:   124 / 77  (left arm)  Vitals Entered By: Sharen Heck RN (July 16, 2009 4:09 PM) CC: patient seen at urgent care for strep throat. Currenttly taking Penicillin VK 500 mg Is Patient Diabetic? Yes Did you bring your meter with you today? Yes Pain Assessment Patient in pain? no      CBG Result 161  Does patient need assistance? Functional Status Self care Ambulation Normal   CC:  patient seen at urgent care for strep throat. Currenttly taking Penicillin VK 500 mg.  History of Present Illness: Pt had sore throat on Monday and went to the ED and was diagnosed with strep throat.  He just wanted it checked.  He feels better on the PCN.  Preventive Screening-Counseling & Management  Alcohol-Tobacco     Alcohol drinks/day: <1     Alcohol type: beer and liquor occ.     Smoking Status: never     Passive Smoke Exposure: no  Caffeine-Diet-Exercise     Caffeine use/day: 2     Does Patient Exercise: yes     Type of exercise: walks     Exercise (avg: min/session): <30     Times/week: <3  Current Problems (verified): 1)  Hip Pain, Left  (ICD-719.45) 2)  Abdominal Pain Right Lower Quadrant  (ICD-789.03) 3)  Acute Maxillary Sinusitis  (ICD-461.0) 4)  Dm  (ICD-250.00) 5)  Preventive Health Care  (ICD-V70.0) 6)  Leg Pain, Right  (ICD-729.5) 7)  Acute Bronchitis  (ICD-466.0) 8)  External Hemorrhoids Without Mention Comp  (ICD-455.3) 9)  Neck Pain  (ICD-723.1) 10)  Elevated Blood Pressure  (ICD-796.2) 11)  Low Back Pain  (ICD-724.2) 12)  Hyperlipidemia  (ICD-272.4) 13)  HIV Disease  (ICD-042)  Current Medications (verified): 1)  Anusol-Hc 25 Mg Supp (Hydrocortisone Acetate) .... Insert One Suppository Rectally Each Day 2)  Acyclovir 200  Mg Caps (Acyclovir) .... Two Caps Two Times A Day 3)  Lipitor 40 Mg Tabs (Atorvastatin Calcium) .... One Daily 4)  Zetia 10 Mg Tabs (Ezetimibe) .... One Daily 5)  Naproxen Dr 500 Mg Tbec (Naproxen) .... One Two Times A Day 6)  Combivir 150-300 Mg Tabs (Lamivudine-Zidovudine) .... One Two Times A Day 7)  Viracept 625 Mg Tabs (Nelfinavir Mesylate) .... Two Tabs Two Times A Day With Meals 8)  Flexeril 10 Mg Tabs (Cyclobenzaprine Hcl) .... Take 1 Tablet By Mouth Every 8 Hours As Needed 9)  Vicodin 5-500 Mg Tabs (Hydrocodone-Acetaminophen) .... Take 1 Tablet By Mouth Every 6 Hours As Needed 10)  Celebrex 100 Mg Caps (Celecoxib) .... Take 1 Tablet By Mouth Two Times A Day 11)  Glucotrol Xl 10 Mg Xr24h-Tab (Glipizide) .... Take 1 Tablet By Mouth Once A Day 12)  Clotrimazole 1 % Crea (Clotrimazole) .... Apply Two Times A Day 13)  Metformin Hcl 500 Mg Tabs (Metformin Hcl) .... Take 1 Tablet By Mouth Two Times A Day 14)  Vicodin 5-500 Mg Tabs (Hydrocodone-Acetaminophen) .... Take 1 Tablet By Mouth Every 8 Hours As Needed  Allergies (verified): No Known Drug Allergies   Review of Systems  The patient denies anorexia and fever.     Physical Exam  General:  alert, well-developed, well-nourished, and well-hydrated.   Head:  normocephalic and atraumatic.   Mouth:  no exudates and pharyngeal exudate.     Impression & Recommendations:  Problem # 1:  PHARYNGITIS, STREPTOCOCCAL (ICD-034.0) Pt instructed to take all of his PCN. His updated medication list for this problem includes:    Naproxen Dr 500 Mg Tbec (Naproxen) ..... One two times a day    Celebrex 100 Mg Caps (Celecoxib) .[name]... Take 1 tablet by mouth two times a day  Other Orders: Est. Patient Level III (16109)

## 2010-07-01 NOTE — Progress Notes (Signed)
Summary: Pt assist med arrived 3 months supply via [facility] 01/07/10  Phone Note Refill Request      Prescriptions: LIPITOR 40 MG TABS (ATORVASTATIN CALCIUM) one daily  #90 x 0   Entered by:   Paulo Fruit  BS,CPht II,MPH   Authorized by:   Yisroel Ramming MD   Signed by:   Paulo Fruit  BS,CPht II,MPH on 10/22/2009   Method used:   Samples Given   RxID:   [id number]   Patient Assist Medication Verification: Medication: Lipitor 40mg  Lot# [id number] Exp Date:12 2013 Tech approval:MLD Call placed to patient with message that assistance medications are ready for pick-up. Paulo Fruit  BS,CPht II,MPH  Oct 22, 2009 2:14 PM                  Appended Document: Pt assist med arrived 3 months supply via [facility] n[facility]rfl 01/07/10 Prescription/Samples picked up by: patient

## 2010-07-01 NOTE — Miscellaneous (Signed)
Summary: Orders Update  Clinical Lists Changes  Orders: Added new Test order of T-CBC w/Diff [phone]) - Signed Added new Test order of T-Comprehensive Metabolic Panel [phone]) - Signed Added new Test order of T-HIV Viral Load [phone]) - Signed Added new Test order of T-CD4SP [facility]) (CD4SP) - Signed     Process Orders Check Orders Results:     [facility]: ABN not required for this insurance Tests Sent for requisitioning (January 12, 2010 9:55 AM):     01/12/2010: [facility] -- T-CBC w/Diff [[id number]] (signed)     01/12/2010: [facility] -- T-Comprehensive Metabolic Panel [[id number]] (signed)     01/12/2010: [facility] -- T-HIV Viral Load [phone] (signed)

## 2010-07-01 NOTE — Miscellaneous (Signed)
Summary: [facility]   Imported By: Florinda Marker 10/28/2009 09:00:51  _____________________________________________________________________  External Attachment:    Type:   Image     Comment:   External Document

## 2010-07-01 NOTE — Progress Notes (Signed)
Summary: Lopid 3 month supply through [employer]	[place] Note Outgoing Call   Call placed by: Annice Pih Summary of Call: Received fax from [employer] for pts. Lopid to be filled #180 for a three month supply.  Dr. Ileana Ladd this and I called in to [facility] at RX outreach at [phone] ext. [phone] Initial call taken by: Wendall Mola CMA Duncan Dull),  June 07, 2010 4:46 PM

## 2010-07-01 NOTE — Progress Notes (Signed)
Summary: [facility]/pt assist med arrived for Jun via [facility] ADAP  Phone Note Refill Request      Prescriptions: VIRACEPT 625 MG TABS (NELFINAVIR MESYLATE) Take 2 tablets two times a day  #120 x 0   Entered by:   Paulo Fruit  BS,CPht II,MPH   Authorized by:   Yisroel Ramming MD   Signed by:   Paulo Fruit  BS,CPht II,MPH on 11/03/2009   Method used:   Samples Given   RxID:   [id number] COMBIVIR 150-300 MG TABS (LAMIVUDINE-ZIDOVUDINE) Take 1 tablet by mouth two times a day  #60 x 0   Entered by:   Paulo Fruit  BS,CPht II,MPH   Authorized by:   Yisroel Ramming MD   Signed by:   Paulo Fruit  BS,CPht II,MPH on 11/03/2009   Method used:   Samples Given   RxID:   [id number] ACYCLOVIR 200 MG CAPS (ACYCLOVIR) two caps two times a day  #120 x 0   Entered by:   Paulo Fruit  BS,CPht II,MPH   Authorized by:   Yisroel Ramming MD   Signed by:   Paulo Fruit  BS,CPht II,MPH on 11/03/2009   Method used:   Samples Given   RxID:   [id number]  Patient Assist Medication Verification: Medication name: Acyclovir 200mg  RX # [id number] Tech approval:MLD  Patient Assist Medication Verification: Medication name:combivir 150/300mg  RX # [id number] Tech approval:MLD  Patient Assist Medication Verification: Medication name:viracept 625mg  RX # [id number] Tech approval:MLD Call placed to patient with message that assistance medications are ready for pick-up. Left message with his mother. Paulo Fruit  BS,CPht II,MPH  November 03, 2009 4:22 PM

## 2010-07-01 NOTE — Consult Note (Signed)
Summary: Consultation Report  Consultation Report   Imported By: Florinda Marker 06/14/2010 14:04:13  _____________________________________________________________________  External Attachment:    Type:   Image     Comment:   External Document

## 2010-07-01 NOTE — Miscellaneous (Signed)
Summary: RW Update  Clinical Lists Changes  Observations: Added new observation of GENDER: Male (04/19/2010 15:52)

## 2010-07-01 NOTE — Miscellaneous (Signed)
Summary: [employer]  [employer]   Imported By: Florinda Marker 04/30/2010 14:38:11  _____________________________________________________________________  External Attachment:    Type:   Image     Comment:   External Document

## 2010-07-01 NOTE — Miscellaneous (Signed)
Summary: clinical update/ryan white  Clinical Lists Changes  Observations: Added new observation of REC_MESSAGE: Yes (10/12/2009 15:06) Added new observation of RECPHONECALL: Yes (10/12/2009 15:06) Added new observation of REC_MAIL: Yes (10/12/2009 15:06) Added new observation of #CHILD<18 IN: No (10/12/2009 15:06) Added new observation of AIDSDAP: Yes 2011 (10/12/2009 15:06) Added new observation of HOUSEINCOME: 0  (10/12/2009 15:06) Added new observation of FINASSESSDT: 10/08/2009  (10/12/2009 15:06)

## 2010-07-01 NOTE — Progress Notes (Signed)
Summary: [facility]/pt assist meds arrived for May  Phone Note Refill Request      Prescriptions: VIRACEPT 625 MG TABS (NELFINAVIR MESYLATE) Take 2 tablets two times a day  #120 x 0   Entered by:   Paulo Fruit  BS,CPht II,MPH   Authorized by:   Yisroel Ramming MD   Signed by:   Paulo Fruit  BS,CPht II,MPH on 10/02/2009   Method used:   Samples Given   RxID:   [id number] COMBIVIR 150-300 MG TABS (LAMIVUDINE-ZIDOVUDINE) Take 1 tablet by mouth two times a day  #60 x 0   Entered by:   Paulo Fruit  BS,CPht II,MPH   Authorized by:   Yisroel Ramming MD   Signed by:   Paulo Fruit  BS,CPht II,MPH on 10/02/2009   Method used:   Samples Given   RxID:   [id number] ACYCLOVIR 200 MG CAPS (ACYCLOVIR) two caps two times a day  #120 x 0   Entered by:   Paulo Fruit  BS,CPht II,MPH   Authorized by:   Yisroel Ramming MD   Signed by:   Paulo Fruit  BS,CPht II,MPH on 10/02/2009   Method used:   Samples Given   RxID:   [id number]   Patient Assist Medication Verification: Medication: Combivir 150/30mg  Lot# [id number] Exp Date:Dec 2014 Tech approval:MLD                Patient Assist Medication Verification: Medication: Viracept 625mg  Lot# [id number] Exp Date:11 2013 Tech approval:MLD                Patient Assist Medication Verification: Medication:Acyclovir 200mg  EPP#[id number] Exp Date:07 2012 Tech approval:MLD  Call placed to patient with message that assistance medications are ready for pick-up. Left message with his mother Paulo Fruit  BS,CPht II,MPH  Oct 02, 2009 3:55 PM

## 2010-07-01 NOTE — Progress Notes (Signed)
Summary: [facility]/pt assist meds arrived for Apr  Phone Note Refill Request      Prescriptions: COMBIVIR 150-300 MG TABS (LAMIVUDINE-ZIDOVUDINE) Take 1 tablet by mouth two times a day  #60 x 0   Entered by:   Paulo Fruit  BS,CPht II,MPH   Authorized by:   Yisroel Ramming MD   Signed by:   Paulo Fruit  BS,CPht II,MPH on 09/09/2009   Method used:   Samples Given   RxID:   [id number] VIRACEPT 625 MG TABS (NELFINAVIR MESYLATE) Take 2 tablets two times a day  #120 x 0   Entered by:   Paulo Fruit  BS,CPht II,MPH   Authorized by:   Yisroel Ramming MD   Signed by:   Paulo Fruit  BS,CPht II,MPH on 09/09/2009   Method used:   Samples Given   RxID:   [id number] ACYCLOVIR 200 MG CAPS (ACYCLOVIR) two caps two times a day  #120 x 0   Entered by:   Paulo Fruit  BS,CPht II,MPH   Authorized by:   Yisroel Ramming MD   Signed by:   Paulo Fruit  BS,CPht II,MPH on 09/09/2009   Method used:   Samples Given   RxID:   [id number]   Patient Assist Medication Verification: Medication: Viracept 625mg  [id number] Exp Date:11 2013 Tech approval:MLD   Patient Assist Medication Verification: Medication: Acyclovir 200mg  QQV#[id number] Exp Date:07 2012 Tech approval:MLD                Patient Assist Medication Verification: Medication name:Combivir 150mg /300mg  RX # [id number] Tech approval:MLD Call placed to patient with message that assistance medications are ready for pick-up. Paulo Fruit  BS,CPht II,MPH  September 09, 2009 1:52 PM

## 2010-07-12 ENCOUNTER — Encounter (INDEPENDENT_AMBULATORY_CARE_PROVIDER_SITE_OTHER): Payer: Self-pay | Admitting: *Deleted

## 2010-07-14 ENCOUNTER — Encounter (INDEPENDENT_AMBULATORY_CARE_PROVIDER_SITE_OTHER): Payer: Self-pay | Admitting: *Deleted

## 2010-07-19 ENCOUNTER — Encounter (INDEPENDENT_AMBULATORY_CARE_PROVIDER_SITE_OTHER): Payer: Self-pay | Admitting: *Deleted

## 2010-07-21 NOTE — Miscellaneous (Signed)
  Clinical Lists Changes 

## 2010-07-21 NOTE — Miscellaneous (Signed)
Summary: ADAP APPROVED TIL 02/27/11  Clinical Lists Changes  Observations: Added new observation of AIDSDAP: Yes 2012 (07/12/2010 10:57)

## 2010-07-27 NOTE — Miscellaneous (Signed)
  Clinical Lists Changes  Observations: Added new observation of HIV RISK BEH: MSM (07/19/2010 11:58)

## 2010-08-02 ENCOUNTER — Ambulatory Visit (INDEPENDENT_AMBULATORY_CARE_PROVIDER_SITE_OTHER): Payer: Self-pay | Admitting: Adult Health

## 2010-08-02 ENCOUNTER — Encounter: Payer: Self-pay | Admitting: Adult Health

## 2010-08-02 DIAGNOSIS — E119 Type 2 diabetes mellitus without complications: Secondary | ICD-10-CM

## 2010-08-02 DIAGNOSIS — B20 Human immunodeficiency virus [HIV] disease: Secondary | ICD-10-CM

## 2010-08-02 DIAGNOSIS — IMO0002 Reserved for concepts with insufficient information to code with codable children: Secondary | ICD-10-CM | POA: Insufficient documentation

## 2010-08-02 LAB — CONVERTED CEMR LAB
ALT: 20 units/L (ref 0–53)
Alkaline Phosphatase: 74 units/L (ref 39–117)
BUN: 16 mg/dL (ref 6–23)
Bilirubin Urine: NEGATIVE
CO2: 21 meq/L (ref 19–32)
Chloride: 96 meq/L (ref 96–112)
Eosinophils Absolute: 0.1 10*3/uL (ref 0.0–0.7)
HCT: 39.5 % (ref 39.0–52.0)
HDL: 40 mg/dL (ref >39)
Leukocytes, UA: NEGATIVE
Lymphs Abs: 1.3 10*3/uL (ref 0.7–4.0)
MCHC: 33.2 g/dL (ref 30.0–36.0)
MCV: 101 fL — ABNORMAL HIGH (ref 78.0–100.0)
Monocytes Absolute: 0.4 10*3/uL (ref 0.1–1.0)
Neutro Abs: 2.6 10*3/uL (ref 1.7–7.7)
RBC: 3.91 M/uL — ABNORMAL LOW (ref 4.22–5.81)
RDW: 13.4 % (ref 11.5–15.5)
Sodium: 133 meq/L — ABNORMAL LOW (ref 135–145)
Specific Gravity, Urine: 1.04 — ABNORMAL HIGH (ref 1.005–1.030)
Triglycerides: 307 mg/dL — ABNORMAL HIGH (ref <150)
Urine Glucose: >1000 mg/dL — AB
VLDL: 61 mg/dL — ABNORMAL HIGH (ref 0–40)

## 2010-08-03 ENCOUNTER — Encounter: Payer: Self-pay | Admitting: Adult Health

## 2010-08-03 ENCOUNTER — Other Ambulatory Visit: Payer: Self-pay | Admitting: Adult Health

## 2010-08-03 ENCOUNTER — Other Ambulatory Visit (INDEPENDENT_AMBULATORY_CARE_PROVIDER_SITE_OTHER): Payer: Self-pay

## 2010-08-03 DIAGNOSIS — B20 Human immunodeficiency virus [HIV] disease: Secondary | ICD-10-CM

## 2010-08-04 LAB — T-HELPER CELL (CD4) - (RCID CLINIC ONLY): CD4 % Helper T Cell: 37 % (ref 33–55)

## 2010-08-10 ENCOUNTER — Other Ambulatory Visit: Payer: Self-pay

## 2010-08-10 LAB — T-HELPER CELL (CD4) - (RCID CLINIC ONLY): CD4 T Cell Abs: 700 uL (ref 400–2700)

## 2010-08-10 NOTE — Assessment & Plan Note (Signed)
Summary: RASH ON BOTTOM/WEIGHT LOSS/VS   Vital Signs:  Patient profile:   50 year old male Height:      71 inches Weight:      179.8 pounds BMI:     25.17 Temp:     98.4 degrees F oral Pulse rate:   91 / minute BP sitting:   136 / 87  (right arm)  Vitals Entered By: Alesia Morin CMA (August 02, 2010 3:07 PM) CC: Pt in today with weigh loss and rash that has cleared up. Pain in shoulder possible pulled muscle. Wants to have blood work today to see if the new meds are working. Very concerned about his weigh loss. Pt wants to see if the provider will run blood test and urine teat today. Is Patient Diabetic? Yes Did you bring your meter with you today? No Pain Assessment Patient in pain? yes     Location: shoulder Intensity: 5 Type: aching Nutritional Status BMI of 25 - 29 = overweight Nutritional Status Detail appetite "fine"  Have you ever been in a relationship where you felt threatened, hurt or afraid?No   Does patient need assistance? Functional Status Self care Ambulation Normal Comments no missed doses   CC:  Pt in today with weigh loss and rash that has cleared up. Pain in shoulder possible pulled muscle. Wants to have blood work today to see if the new meds are working. Very concerned about his weigh loss. Pt wants to see if the provider will run blood test and urine teat today.[name]  History of Present Illness: Presents with recent hx of "rash" on buttocks, but states this is almost healed.  Major c/o weight loss over past 2 months.  Has hx of DM II for which he is supposed to take metformin 500mg  two times a day and glipizide 10mg  once daily, but states he was told he only needed to take it as needed.  He has not taken any meds for DM for >27months.  he has c/o polydipsia, polyuria, and polyphagia along with his weight loss and he does not relate weight loss to his poorly managed DM.  Additionally he very sporadically checks his CBG's and when he does they usually run between  175 and 300 which he thought was not high.  Also c/o pulled muscle in left shoulder with aching pain with left shoulder extension.  Preventive Screening-Counseling & Management  Alcohol-Tobacco     Alcohol drinks/day: 1     Alcohol type: beer and liquor occ.     Smoking Status: never     Passive Smoke Exposure: no  Caffeine-Diet-Exercise     Caffeine use/day: occasional     Does Patient Exercise: yes     Type of exercise: walks     Exercise (avg: min/session): <30     Times/week: <3  Hep-HIV-STD-Contraception     Sun Exposure-Excessive: occasionally  Safety-Violence-Falls     Seat Belt Use: 100      Sexual History:  n/a.        Drug Use:  no.        Blood Transfusions:  no.        Travel History:  no.    Comments: pt declined condoms  Allergies: No Known Drug Allergies  Past History:  Past medical, surgical, family and social histories (including risk factors) reviewed for relevance to current acute and chronic problems.  Past Medical History: Reviewed history from 07/17/2008 and no changes required. HIV disease Hyperlipidemia Low back pain  Family History: Reviewed history and no changes required. Family History Diabetes 1st degree relative  Social History: Reviewed history and no changes required. Blood Transfusions:  no Travel History:  no  Review of Systems       The patient complains of weight loss and unusual weight change.  The patient denies anorexia, fever, weight gain, vision loss, decreased hearing, hoarseness, chest pain, syncope, dyspnea on exertion, peripheral edema, prolonged cough, headaches, hemoptysis, abdominal pain, melena, hematochezia, severe indigestion/heartburn, hematuria, incontinence, genital sores, muscle weakness, suspicious skin lesions, transient blindness, difficulty walking, depression, abnormal bleeding, enlarged lymph nodes, angioedema, breast masses, and testicular masses.         See HPI  Physical Exam  General:   alert, well-developed, well-nourished, and well-hydrated.  20 poiund weight loss since last visit. Head:  normocephalic and atraumatic.   Eyes:  vision grossly intact, pupils equal, pupils round, and pupils reactive to light.   Ears:  R ear normal and L ear normal.   Nose:  no external deformity and no nasal discharge.   Mouth:  pharynx pink and moist and fair dentition.   Neck:  supple, full ROM, and no masses.   Lungs:  normal respiratory effort and normal breath sounds.   Heart:  normal rate, regular rhythm, no murmur, no gallop, and no rub.   Abdomen:  Bowel sounds positive,abdomen soft and non-tender without masses, organomegaly or hernias noted. Rectal:  Old healed papular rash on buttocks noted. Msk:  normal ROM.   Extremities:  No clubbing, cyanosis, edema, or deformity noted with normal full range of motion of all joints.   Neurologic:  alert & oriented X3, cranial nerves II-XII intact, strength normal in all extremities, sensation intact to light touch, sensation intact to pinprick, and gait normal.   Skin:  Healing papular rash on buttocks Psych:  Cognition and judgment appear intact. Alert and cooperative with normal attention span and concentration. No apparent delusions, illusions, hallucinations   Impression & Recommendations:  Problem # 1:  HIV DISEASE (ICD-042) Needs re-staging, but very doubtful weight loss is related to this as his last CD4  was approx. 700 with VL ,20.  He was informed of this and verbally acknowledged.  However, his current regimen can only contribute to his DM and dyslipidemia significantly so we will make a stable switch form Combivir/Viracept to Truvada + Isnetress today, have him RTC in 4 weeks for repeat labs and f/u in 6 weeks. Orders: T-CBC w/Diff [id number]) T-CD4SP [facility]) (CD4SP) T-Comprehensive Metabolic Panel [phone]) T-HIV Viral Load [phone]) Est. Patient Level V (99215)Future Orders: T-CBC w/Diff ([id number]) ...  08/30/2010 T-CD4SP ([facility]) (CD4SP) ... 08/30/2010 T-Comprehensive Metabolic Panel [id number]) ... 08/30/2010 T-HIV Viral Load [id number]) ... 08/30/2010  Problem # 2:  DM (ICD-250.00)  Very poorly controlled just based upon history alone and presenting symptoms which imply a glucose most likely >300.  He is poorly adherent with therapy and apparently knowledge deficient in understanding DM, treatment and how it is managed.  He was instructed to take both metformin and glipizide as prescribed daily NOT as needed.  He was also instructed to check his CBG's two times a day ac and record and bring these values to clinic on next follow-up.  Meanwhile we will check a lipid panel and a HgbA1C today as well.  Based on values we will determine if more immediate f/u is needed.  He will need DB teaching referral, ophthomology consult, and if necessary, endocrine referral based on response to his  oral medication therapy.  Verbally acknowledged this and agreed to follow plan.  His updated medication list for this problem includes:    Metformin Hcl 500 Mg Tabs (Metformin hcl) .[name]... Take 1 tablet by mouth two times a day    Glipizide 10 Mg Tabs (Glipizide) .[name]... Take 1 tablet by mouth once a day  Orders: T-Hgb A1C (in-house) ([id number]) Est. Patient Level V (11914) T-Urinalysis (81003-65000)Future Orders: T-Hgb A1C (in-house) ([id number]) ... 08/30/2010  Problem # 3:  HYPERLIPIDEMIA (ICD-272.4) Stopped takin Lopid as he could not afford medication.  Wanted to restart Zetia, but we informed him this would most likely not help in the immediate future as it would require PAP application.  Meanwhile we will add fish oil 1200 mg by mouth three times a day with meals for now and also check a lipid profile today.  Verbally acknowledged this as well. The following medications were removed from the medication list:    Lopid 600 Mg Tabs (Gemfibrozil) .[name]... Take one tablet twice a day 30 minutes before meals His  updated medication list for this problem includes:    Lipitor 40 Mg Tabs (Atorvastatin calcium) ..... One daily  Orders: T-Lipid Profile ([id number])Future Orders: T-Lipid Profile ([id number]) ... 08/30/2010  Problem # 4:  SHOULDER STRAIN, LEFT (ICD-840.9) Warm moist heat left shoulder as needed with ROM exercises.  Ibuprofen 600mg  by mouth q6h as needed for pain.  Verbally acknowledged instructions.  Medications Added to Medication List This Visit: 1)  Truvada 200-300 Mg Tabs (Emtricitabine-tenofovir) .... Take one (1) tablet once a day 2)  Isentress 400 Mg Tabs (Raltegravir potassium) .... Take one (1) tablet every twelve (12) hours 3)  Omega 3 1200 Mg Caps (Omega-3 fatty acids) .[name].. 1 cap three times a day with meals Prescriptions: ISENTRESS 400 MG TABS (RALTEGRAVIR POTASSIUM) Take one (1) tablet every twelve (12) hours  #60 x 5   Entered and Authorized by:   Talmadge Chad NP   Signed by:   Talmadge Chad NP on 08/03/2010   Method used:   Telephoned to ...       [facility] [phone]* (retail)       [address]       [address]       [place], [place]  [address]       Ph: [phone]       Fax: [phone]   RxID:   [id number] TRUVADA 200-300 MG TABS (EMTRICITABINE-TENOFOVIR) Take one (1) tablet once a day  #30 x 5   Entered and Authorized by:   Talmadge Chad NP   Signed by:   Talmadge Chad NP on 08/03/2010   Method used:   Telephoned to ...       [facility] [phone]* (retail)       [address]       [address]       [place], [place]  [address]       Ph: [phone]       Fax: [phone]   RxID:   [id number]    Orders Added: 1)  T-Hgb A1C (in-house) [[id number]] 2)  T-Lipid Profile [[id number]] 3)  T-CBC w/Diff [[id number]] 4)  T-CD4SP ([facility]) [CD4SP] 5)  T-Comprehensive Metabolic Panel [[id number]] 6)  T-HIV Viral Load [phone] 7)  Est. Patient Level V [99215] 8)  T-Urinalysis [81003-65000] 9)   T-Hgb A1C (in-house) [83036QW] 10)  T-CBC w/Diff [[id number]] 11)  T-CD4SP ([facility]) [CD4SP] 12)  T-Comprehensive Metabolic Panel [[id number]] 13)  T-HIV Viral Load [phone] 14)  T-Lipid Profile [80061-22930]

## 2010-08-10 NOTE — Miscellaneous (Signed)
  Clinical Lists Changes Poorly adherent, poorly controlled DM II A1C = 11.7%,  random glucose >400 Refer to Diabetic Clinic for teaching and medication modification recommendations. Orders: Added new Referral order of Diabetic Clinic Referral (Diabetic) - Signed

## 2010-08-15 LAB — COMPREHENSIVE METABOLIC PANEL
ALT: 17 U/L (ref 0–53)
CO2: 26 mEq/L (ref 19–32)
Calcium: 9.6 mg/dL (ref 8.4–10.5)
Chloride: 100 mEq/L (ref 96–112)
GFR calc non Af Amer: >60 mL/min (ref >60)
Glucose, Bld: 103 mg/dL — ABNORMAL HIGH (ref 70–99)
Sodium: 133 mEq/L — ABNORMAL LOW (ref 135–145)
Total Bilirubin: 0.3 mg/dL (ref 0.3–1.2)

## 2010-08-15 LAB — POCT URINALYSIS DIP (DEVICE)
Bilirubin Urine: NEGATIVE
Glucose, UA: NEGATIVE mg/dL
Hgb urine dipstick: NEGATIVE
Nitrite: NEGATIVE
Specific Gravity, Urine: 1.02 (ref 1.005–1.030)

## 2010-08-15 LAB — CBC
Hemoglobin: 14 g/dL (ref 13.0–17.0)
MCHC: 33.7 g/dL (ref 30.0–36.0)
MCV: 103.6 fL — ABNORMAL HIGH (ref 78.0–100.0)
RBC: 4.02 MIL/uL — ABNORMAL LOW (ref 4.22–5.81)
WBC: 5.3 10*3/uL (ref 4.0–10.5)

## 2010-08-15 LAB — DIFFERENTIAL
Basophils Absolute: 0 10*3/uL (ref 0.0–0.1)
Basophils Relative: 0 % (ref 0–1)
Eosinophils Absolute: 0.1 10*3/uL (ref 0.0–0.7)
Lymphs Abs: 1.7 10*3/uL (ref 0.7–4.0)
Neutrophils Relative %: 53 % (ref 43–77)

## 2010-08-17 LAB — T-HELPER CELL (CD4) - (RCID CLINIC ONLY)
CD4 % Helper T Cell: 34 % (ref 33–55)
CD4 T Cell Abs: 710 uL (ref 400–2700)

## 2010-08-22 LAB — POCT RAPID STREP A (OFFICE): Streptococcus, Group A Screen (Direct): NEGATIVE

## 2010-08-30 ENCOUNTER — Other Ambulatory Visit (INDEPENDENT_AMBULATORY_CARE_PROVIDER_SITE_OTHER): Payer: Self-pay

## 2010-08-30 ENCOUNTER — Inpatient Hospital Stay (INDEPENDENT_AMBULATORY_CARE_PROVIDER_SITE_OTHER)
Admission: RE | Admit: 2010-08-30 | Discharge: 2010-08-30 | Disposition: A | Discharge status: Home / Self Care | Payer: Self-pay | Source: Ambulatory Visit

## 2010-08-30 DIAGNOSIS — B20 Human immunodeficiency virus [HIV] disease: Secondary | ICD-10-CM

## 2010-08-30 DIAGNOSIS — J019 Acute sinusitis, unspecified: Secondary | ICD-10-CM

## 2010-08-31 LAB — T-HELPER CELL (CD4) - (RCID CLINIC ONLY)
CD4 % Helper T Cell: 37 % (ref 33–55)
CD4 T Cell Abs: 990 uL (ref 400–2700)

## 2010-08-31 LAB — CBC WITH DIFFERENTIAL/PLATELET
Eosinophils Relative: 3 % (ref 0–5)
HCT: 37.9 % — ABNORMAL LOW (ref 39.0–52.0)
Lymphocytes Relative: 37 % (ref 12–46)
Lymphs Abs: 2.8 10*3/uL (ref 0.7–4.0)
MCV: 105.6 fL — ABNORMAL HIGH (ref 78.0–100.0)
Monocytes Absolute: 0.5 10*3/uL (ref 0.1–1.0)
RBC: 3.59 MIL/uL — ABNORMAL LOW (ref 4.22–5.81)
RDW: 12.7 % (ref 11.5–15.5)
WBC: 7.4 10*3/uL (ref 4.0–10.5)

## 2010-08-31 LAB — COMPLETE METABOLIC PANEL WITH GFR
Albumin: 4.1 g/dL (ref 3.5–5.2)
CO2: 23 mEq/L (ref 19–32)
GFR, Est African American: >60 mL/min (ref >60)
GFR, Est Non African American: >60 mL/min (ref >60)
Glucose, Bld: 63 mg/dL — ABNORMAL LOW (ref 70–99)
Potassium: 4 mEq/L (ref 3.5–5.3)
Sodium: 139 mEq/L (ref 135–145)
Total Protein: 7.4 g/dL (ref 6.0–8.3)

## 2010-09-13 ENCOUNTER — Ambulatory Visit (INDEPENDENT_AMBULATORY_CARE_PROVIDER_SITE_OTHER): Payer: Self-pay | Admitting: Adult Health

## 2010-09-13 ENCOUNTER — Encounter: Payer: Self-pay | Admitting: Adult Health

## 2010-09-13 DIAGNOSIS — E785 Hyperlipidemia, unspecified: Secondary | ICD-10-CM

## 2010-09-13 DIAGNOSIS — E119 Type 2 diabetes mellitus without complications: Secondary | ICD-10-CM

## 2010-09-13 DIAGNOSIS — K5900 Constipation, unspecified: Secondary | ICD-10-CM | POA: Insufficient documentation

## 2010-09-13 DIAGNOSIS — B20 Human immunodeficiency virus [HIV] disease: Secondary | ICD-10-CM

## 2010-09-13 NOTE — Progress Notes (Signed)
  Subjective:    Patient ID: Benjamin Hunter, male    DOB: 08-Dec-1960, 50 y.o.   MRN: [id number]  HPI Adherent with regimen.  CBG's much better since he has resumed his medications for DB.  CBG's ranging from 68-130.  Has c/o constipation over past week.  Stools hard with straining when he defecates.  Does not use any fiber supplements or stool softners. Denies abd distention, nausea or vomiting..    Review of Systems  Constitutional: Negative for fever, diaphoresis, activity change, appetite change, fatigue and unexpected weight change.  HENT: Negative.   Eyes: Negative.   Respiratory: Negative.   Cardiovascular: Negative.   Gastrointestinal: Positive for constipation. Negative for nausea, vomiting, abdominal pain, diarrhea, blood in stool, abdominal distention, anal bleeding and rectal pain.  Genitourinary: Negative.   Musculoskeletal: Negative.   Neurological: Negative.   Hematological: Negative.   Psychiatric/Behavioral: Negative.        Objective:   Physical Exam  Constitutional: He is oriented to person, place, and time. He appears well-developed and well-nourished.  HENT:  Head: Normocephalic and atraumatic.  Right Ear: External ear normal.  Left Ear: External ear normal.  Nose: Nose normal.  Mouth/Throat: Oropharynx is clear and moist.  Eyes: Conjunctivae and EOM are normal. Pupils are equal, round, and reactive to light.  Neck: Normal range of motion. Neck supple.  Cardiovascular: Normal rate, regular rhythm, normal heart sounds and intact distal pulses.   Pulmonary/Chest: Effort normal and breath sounds normal.  Abdominal: Soft. He exhibits no distension and no mass. There is no tenderness. There is no rebound and no guarding.       Bowel sounds hypoactive x 4 quads.  Musculoskeletal: Normal range of motion.  Neurological: He is alert and oriented to person, place, and time. No cranial nerve deficit. He exhibits normal muscle tone. Coordination normal.  Skin: Skin  is warm and dry.  Psychiatric: He has a normal mood and affect. His behavior is normal. Judgment and thought content normal.          Assessment & Plan:  HIV:  CD4 920 @ 37% with VL <20 copies/ml.  Clinically stable.  CPM  Repeat labs in 10 weeks with f/u in 3 months.  DBII:  Marked improvement! We will continue to monitor jhis progress.  Recommend for him him to continue checking his CBG's ac and pc and recording in a log.  Will obtain Hgb a1c on next blood draw.  Dyslipidemia:  Incredible improvement most likely associated with better glycemic control  T chol down 10 170 from 310, Trigs down to 71 from > 300 HD was 48.  All showing marked improvement.  Continued to encourage to keep with his diabetic dietary plan and keeping with his medications.  Constipation:  May be associated with mild gastroparesis secondary to DB.  Encouraged to use Metamucil in ice water bid ac.  Verbally ackniowledged and agreed with plan.

## 2010-10-06 ENCOUNTER — Ambulatory Visit: Payer: Self-pay

## 2010-12-07 ENCOUNTER — Other Ambulatory Visit (INDEPENDENT_AMBULATORY_CARE_PROVIDER_SITE_OTHER): Payer: Self-pay

## 2010-12-07 DIAGNOSIS — B20 Human immunodeficiency virus [HIV] disease: Secondary | ICD-10-CM

## 2010-12-07 DIAGNOSIS — E119 Type 2 diabetes mellitus without complications: Secondary | ICD-10-CM

## 2010-12-07 DIAGNOSIS — E785 Hyperlipidemia, unspecified: Secondary | ICD-10-CM

## 2010-12-07 LAB — CBC WITH DIFFERENTIAL/PLATELET
Basophils Relative: 1 % (ref 0–1)
Hemoglobin: 14.1 g/dL (ref 13.0–17.0)
MCHC: 32.4 g/dL (ref 30.0–36.0)
Monocytes Relative: 8 % (ref 3–12)
Neutro Abs: 2.3 10*3/uL (ref 1.7–7.7)
Neutrophils Relative %: 47 % (ref 43–77)
RBC: 4.69 MIL/uL (ref 4.22–5.81)

## 2010-12-07 LAB — COMPREHENSIVE METABOLIC PANEL
ALT: 61 U/L — ABNORMAL HIGH (ref 0–53)
AST: 58 U/L — ABNORMAL HIGH (ref 0–37)
Albumin: 4.4 g/dL (ref 3.5–5.2)
Alkaline Phosphatase: 83 U/L (ref 39–117)
Glucose, Bld: 74 mg/dL (ref 70–99)
Potassium: 4 mEq/L (ref 3.5–5.3)
Sodium: 137 mEq/L (ref 135–145)
Total Protein: 8.4 g/dL — ABNORMAL HIGH (ref 6.0–8.3)

## 2010-12-07 LAB — LIPID PANEL
HDL: 34 mg/dL — ABNORMAL LOW (ref >39)
LDL Cholesterol: 58 mg/dL (ref 0–99)
Total CHOL/HDL Ratio: 4 Ratio
Triglycerides: 213 mg/dL — ABNORMAL HIGH (ref <150)
VLDL: 43 mg/dL — ABNORMAL HIGH (ref 0–40)

## 2010-12-08 LAB — HIV-1 RNA QUANT-NO REFLEX-BLD: HIV-1 RNA Quant, Log: <1.3 {Log} (ref <1.30)

## 2010-12-21 ENCOUNTER — Ambulatory Visit: Payer: Self-pay

## 2010-12-21 ENCOUNTER — Ambulatory Visit (INDEPENDENT_AMBULATORY_CARE_PROVIDER_SITE_OTHER): Payer: Self-pay | Admitting: Adult Health

## 2010-12-21 ENCOUNTER — Encounter: Payer: Self-pay | Admitting: Adult Health

## 2010-12-21 DIAGNOSIS — E785 Hyperlipidemia, unspecified: Secondary | ICD-10-CM

## 2010-12-21 DIAGNOSIS — B20 Human immunodeficiency virus [HIV] disease: Secondary | ICD-10-CM

## 2010-12-21 DIAGNOSIS — E119 Type 2 diabetes mellitus without complications: Secondary | ICD-10-CM

## 2010-12-21 DIAGNOSIS — M79609 Pain in unspecified limb: Secondary | ICD-10-CM

## 2010-12-21 MED ORDER — HYDROCODONE-ACETAMINOPHEN 7.5-500 MG PO TABS: 1.0000 | ORAL_TABLET | Freq: Four times a day (QID) | ORAL | Status: AC | PRN

## 2010-12-23 ENCOUNTER — Telehealth: Payer: Self-pay | Admitting: Adult Health

## 2010-12-23 NOTE — Telephone Encounter (Signed)
Mr. Sawchuk will be coming on 7/27 to sign the application for [employer] for Lipitor.  I will not be here, but I told him where he will need to sign and that it will be waiting up front for him.  I will let Vikki Ports and Corrie Dandy know.

## 2010-12-23 NOTE — Progress Notes (Signed)
  Subjective:    Patient ID: Benjamin Hunter, male    DOB: April 29, 1961, 50 y.o.   MRN: [id number]  HPI Presents to clinic for routine scheduled followup. Endorses adherence to his antiretrovirals with good tolerance and no complications. Also endorses adherence to all his other medications and treatments and states has been feeling essentially well. However, he is persistently having pain to his right knee and right hip region. States this has been an intermittent problem that has returned and has had evaluations radiographically 2 years ago. Nothing more recent.   Review of Systems  Constitutional: Positive for activity change.  HENT: Negative.   Eyes: Negative.   Respiratory: Negative.   Cardiovascular: Negative.   Gastrointestinal: Negative.   Genitourinary: Negative.   Musculoskeletal: Positive for arthralgias and gait problem.  Neurological: Negative.   Hematological: Negative.   Psychiatric/Behavioral: Negative.        Objective:   Physical Exam  Constitutional: He is oriented to person, place, and time. He appears well-developed and well-nourished. No distress.  HENT:  Head: Normocephalic and atraumatic.  Right Ear: External ear normal.  Left Ear: External ear normal.  Mouth/Throat: Oropharynx is clear and moist.  Eyes: Conjunctivae and EOM are normal. Pupils are equal, round, and reactive to light.  Neck: Normal range of motion. Neck supple.  Cardiovascular: Normal rate, regular rhythm, normal heart sounds and intact distal pulses.   Pulmonary/Chest: Effort normal and breath sounds normal.  Abdominal: Soft. Bowel sounds are normal.  Musculoskeletal: He exhibits tenderness.       Limited flexion and extension to the right hip with pain associated with knee flexion as well. No joint edema noted or ecchymosis to either articulating joint.  Neurological: He is alert and oriented to person, place, and time. No cranial nerve deficit. He exhibits normal muscle tone.  Coordination normal.  Skin: Skin is warm and dry.  Psychiatric: He has a normal mood and affect. His behavior is normal. Judgment and thought content normal.          Assessment & Plan:  1. HIV. Labs obtained 12/07/2010 show a CD4 count of 750 at 38% with a viral load of less than 20 copies per mL. Remains clinically stable on current regimen. Recommend continuing present management, repeating fasting labs in 10 weeks with followup in 3-4 months.  2. Right Hip and Knee Pain. We need to have new x-rays performed in order for [place] to make the appropriate for for all if necessary. If there any abnormalities. He does not have a [facility] healthcare card at present. We recommended that he followup with Elig[employer] to obtain this card. Until then, we cannot order the appropriate x-rays necessary to evaluate this ongoing pain. Meantime, we will give him hydrocodone/APAP 7.5/500 one by mouth every 6 hours when necessary pain with a signed pain contract.  3. Diabetes. Marked improvement noticed hemoglobin A1c is down to 5.3%. Serum glucoses average approximately 74. Continue present management.  He verbally acknowledged all information that was provided to him and agreed with plan of care.

## 2010-12-24 ENCOUNTER — Ambulatory Visit: Payer: Self-pay

## 2010-12-29 ENCOUNTER — Other Ambulatory Visit: Payer: Self-pay | Admitting: *Deleted

## 2010-12-29 DIAGNOSIS — E7800 Pure hypercholesterolemia, unspecified: Secondary | ICD-10-CM

## 2010-12-29 MED ORDER — ATORVASTATIN CALCIUM 40 MG PO TABS: 40.0000 mg | ORAL_TABLET | Freq: Every day | ORAL | Status: DC

## 2010-12-29 NOTE — Telephone Encounter (Signed)
Patient did not come in and sign the application last week.  I called him to see when he will come in and he said he would come by today.  After he signs it, the Rx needs to be printed and signed by Nida Boatman and Nida Boatman needs to sign the No Income letter and the application.

## 2010-12-30 NOTE — Telephone Encounter (Signed)
Mailed the completed app & rx to [employer]. Copy in red folder

## 2011-01-05 NOTE — Telephone Encounter (Signed)
The application was just mailed on 8/2 and has not been received yet.  Will call again in 1 week to check the status.

## 2011-01-07 ENCOUNTER — Other Ambulatory Visit: Payer: Self-pay | Admitting: *Deleted

## 2011-01-07 DIAGNOSIS — E7800 Pure hypercholesterolemia, unspecified: Secondary | ICD-10-CM

## 2011-01-07 MED ORDER — ATORVASTATIN CALCIUM 40 MG PO TABS: 40.0000 mg | ORAL_TABLET | Freq: Every day | ORAL | Status: DC

## 2011-01-12 ENCOUNTER — Telehealth: Payer: Self-pay | Admitting: Adult Health

## 2011-01-12 NOTE — Telephone Encounter (Signed)
Called [employer] to check status of application for Lipitor.   Approved through 01/04/2012, order # [id number] for Lipitor was shipped on 01/04/2011 and should arrive within 7/-/10 days.

## 2011-01-24 ENCOUNTER — Other Ambulatory Visit: Payer: Self-pay | Admitting: *Deleted

## 2011-01-24 DIAGNOSIS — B20 Human immunodeficiency virus [HIV] disease: Secondary | ICD-10-CM

## 2011-01-24 MED ORDER — EMTRICITABINE-TENOFOVIR DF 200-300 MG PO TABS: 1.0000 | ORAL_TABLET | Freq: Every day | ORAL | Status: DC

## 2011-01-24 MED ORDER — RALTEGRAVIR POTASSIUM 400 MG PO TABS: 400.0000 mg | ORAL_TABLET | Freq: Two times a day (BID) | ORAL | Status: DC

## 2011-02-24 LAB — POCT I-STAT, CHEM 8
BUN: 10
Creatinine, Ser: 1
Glucose, Bld: 103 — ABNORMAL HIGH
Hemoglobin: 14.3
Potassium: 4
Sodium: 136
TCO2: 24

## 2011-02-24 LAB — CBC
HCT: 40.5
MCHC: 33.4
MCV: 104.4 — ABNORMAL HIGH
Platelets: 187
RDW: 13.1

## 2011-02-24 LAB — URINE MICROSCOPIC-ADD ON

## 2011-02-24 LAB — DIFFERENTIAL
Basophils Relative: 1
Eosinophils Absolute: 0.1
Eosinophils Relative: 1
Neutrophils Relative %: 50

## 2011-02-24 LAB — URINALYSIS, ROUTINE W REFLEX MICROSCOPIC
Ketones, ur: NEGATIVE
Leukocytes, UA: NEGATIVE
Protein, ur: 30 — AB
Urobilinogen, UA: 1

## 2011-03-11 ENCOUNTER — Telehealth: Payer: Self-pay | Admitting: Adult Health

## 2011-03-11 NOTE — Telephone Encounter (Signed)
Call placed to [employer] to order refill on Lipitor.  The order # is [id number] and should be received in the office in the next 7 - 10 business days.

## 2011-03-16 ENCOUNTER — Other Ambulatory Visit: Payer: Self-pay | Admitting: *Deleted

## 2011-03-16 DIAGNOSIS — E7800 Pure hypercholesterolemia, unspecified: Secondary | ICD-10-CM

## 2011-03-16 MED ORDER — ATORVASTATIN CALCIUM 40 MG PO TABS: 40.0000 mg | ORAL_TABLET | Freq: Every day | ORAL | Status: DC

## 2011-03-28 ENCOUNTER — Telehealth: Payer: Self-pay | Admitting: *Deleted

## 2011-03-30 NOTE — Telephone Encounter (Signed)
Opened in error

## 2011-04-05 ENCOUNTER — Other Ambulatory Visit: Payer: Self-pay | Admitting: Infectious Diseases

## 2011-04-05 ENCOUNTER — Other Ambulatory Visit (INDEPENDENT_AMBULATORY_CARE_PROVIDER_SITE_OTHER): Payer: Self-pay

## 2011-04-05 ENCOUNTER — Encounter (HOSPITAL_COMMUNITY): Payer: Self-pay | Admitting: *Deleted

## 2011-04-05 ENCOUNTER — Emergency Department (INDEPENDENT_AMBULATORY_CARE_PROVIDER_SITE_OTHER)
Admission: EM | Admit: 2011-04-05 | Discharge: 2011-04-05 | Disposition: A | Discharge status: Home / Self Care | Payer: Self-pay | Source: Home / Self Care | Attending: Family Medicine | Admitting: Family Medicine

## 2011-04-05 DIAGNOSIS — E119 Type 2 diabetes mellitus without complications: Secondary | ICD-10-CM

## 2011-04-05 DIAGNOSIS — E785 Hyperlipidemia, unspecified: Secondary | ICD-10-CM

## 2011-04-05 DIAGNOSIS — B20 Human immunodeficiency virus [HIV] disease: Secondary | ICD-10-CM

## 2011-04-05 DIAGNOSIS — J069 Acute upper respiratory infection, unspecified: Secondary | ICD-10-CM

## 2011-04-05 HISTORY — DX: Asymptomatic human immunodeficiency virus (hiv) infection status: Z21

## 2011-04-05 HISTORY — DX: Human immunodeficiency virus (HIV) disease: B20

## 2011-04-05 MED ORDER — AZITHROMYCIN 250 MG PO TABS: 250.0000 mg | ORAL_TABLET | Freq: Every day | ORAL | Status: DC

## 2011-04-05 MED ORDER — AZITHROMYCIN 250 MG PO TABS: 250.0000 mg | ORAL_TABLET | Freq: Every day | ORAL | Status: AC

## 2011-04-05 NOTE — ED Provider Notes (Signed)
History     CSN: [id number] Arrival date & time: 04/05/2011  9:13 AM   First MD Initiated Contact with Patient 04/05/11 1011      Chief Complaint  Patient presents with  . Sinusitis    (Consider location/radiation/quality/duration/timing/severity/associated sxs/prior treatment) Patient is a 50 y.o. male presenting with sinusitis. The history is provided by the patient.  Sinusitis  This is a new problem. The current episode started 2 days ago. The problem has been gradually worsening. There has been no fever. Associated symptoms include congestion and sore throat. Pertinent negatives include no chills, no ear pain and no sinus pressure. He has tried acetaminophen and other medications for the symptoms. The treatment provided mild relief.    Past Medical History  Diagnosis Date  . Diabetes mellitus   . HIV (human immunodeficiency virus infection)     History reviewed. No pertinent past surgical history.  History reviewed. No pertinent family history.  History  Substance Use Topics  . Smoking status: Never Smoker   . Smokeless tobacco: Never Used  . Alcohol Use: 1.0 oz/week    2 drink(s) per week      Review of Systems  Constitutional: Negative for fever and chills.  HENT: Positive for congestion, sore throat, rhinorrhea, trouble swallowing and postnasal drip. Negative for ear pain, facial swelling, neck stiffness and sinus pressure.   Cardiovascular: Negative.   Gastrointestinal: Negative.   Genitourinary: Negative.   Musculoskeletal: Positive for arthralgias.    Allergies  Review of patient's allergies indicates no known allergies.  Home Medications   Current Outpatient Rx  Name Route Sig Dispense Refill  . ATORVASTATIN CALCIUM 40 MG PO TABS Oral Take 1 tablet (40 mg total) by mouth daily. 90 tablet 3    PAP arrived.  Lot #[id number] exp 10/2013. Pt notified  . EMTRICITABINE-TENOFOVIR 200-300 MG PO TABS Oral Take 1 tablet by mouth daily. 30 tablet 6  . GLIPIZIDE  10 MG PO TABS Oral Take 10 mg by mouth daily.      [name] LORATADINE 10 MG PO TABS Oral Take 10 mg by mouth daily.      [name] METFORMIN HCL 500 MG PO TABS Oral Take 500 mg by mouth 2 (two) times daily with a meal.      . TUSSIN CF COUGH & COLD PO Oral Take by mouth.      [name] RALTEGRAVIR POTASSIUM 400 MG PO TABS Oral Take 1 tablet (400 mg total) by mouth 2 (two) times daily. 60 tablet 6  . WAVESENSE PRESTO PRO METER DEVI Does not apply by Does not apply route. 1 meter      . OMEGA-3 FATTY ACIDS 1000 MG PO CAPS Oral Take 1 g by mouth 3 (three) times daily with meals.      [name] GLUCOSE BLOOD VI STRP Other 1 each by Other route daily. Use as instructed       BP 137/88  Pulse 81  Temp(Src) 98.2 F (36.8 C) (Oral)  Resp 12  SpO2 98%  Physical Exam  Constitutional: He appears well-developed and well-nourished.  HENT:  Head: Normocephalic.  Right Ear: External ear normal.  Left Ear: External ear normal.       Throat mild erythema without exudated  Neck: Normal range of motion. Neck supple.  Cardiovascular: Normal rate and regular rhythm.   Pulmonary/Chest: Breath sounds normal.       Congested cough noted  Abdominal: Bowel sounds are normal.  Lymphadenopathy:    He has no cervical adenopathy.  Skin: Skin is warm and dry.    ED Course  Procedures (including critical care time)  Labs Reviewed - No data to display No results found.   No diagnosis found.    MDM          Randa Spike, MD 04/05/11 1320

## 2011-04-05 NOTE — ED Notes (Signed)
Pt c/o sinus congestion, postnasal drainage onset yesterday.  States drainage is clear and nose is burning.  Denies any other sx.

## 2011-04-06 LAB — LIPID PANEL
HDL: 42 mg/dL (ref >39)
LDL Cholesterol: 97 mg/dL (ref 0–99)
Total CHOL/HDL Ratio: 3.8 Ratio

## 2011-04-06 LAB — CBC WITH DIFFERENTIAL/PLATELET
Basophils Relative: 0 % (ref 0–1)
Eosinophils Absolute: 0.2 10*3/uL (ref 0.0–0.7)
Eosinophils Relative: 5 % (ref 0–5)
Hemoglobin: 13.4 g/dL (ref 13.0–17.0)
Lymphs Abs: 1.6 10*3/uL (ref 0.7–4.0)
MCH: 30.2 pg (ref 26.0–34.0)
MCHC: 32.1 g/dL (ref 30.0–36.0)
MCV: 94.1 fL (ref 78.0–100.0)
Monocytes Absolute: 0.5 10*3/uL (ref 0.1–1.0)
Monocytes Relative: 10 % (ref 3–12)
Neutrophils Relative %: 55 % (ref 43–77)
RBC: 4.43 MIL/uL (ref 4.22–5.81)

## 2011-04-06 LAB — COMPLETE METABOLIC PANEL WITH GFR
BUN: 14 mg/dL (ref 6–23)
CO2: 24 mEq/L (ref 19–32)
GFR, Est African American: >89 mL/min (ref >89)
GFR, Est Non African American: >89 mL/min (ref >89)
Glucose, Bld: 109 mg/dL — ABNORMAL HIGH (ref 70–99)
Sodium: 138 mEq/L (ref 135–145)
Total Bilirubin: 0.6 mg/dL (ref 0.3–1.2)
Total Protein: 7.7 g/dL (ref 6.0–8.3)

## 2011-04-06 LAB — T-HELPER CELL (CD4) - (RCID CLINIC ONLY): CD4 T Cell Abs: 590 uL (ref 400–2700)

## 2011-04-06 LAB — HEMOGLOBIN A1C: Hgb A1c MFr Bld: 5.5 % (ref <5.7)

## 2011-04-08 ENCOUNTER — Encounter (HOSPITAL_COMMUNITY): Payer: Self-pay

## 2011-04-08 ENCOUNTER — Telehealth (HOSPITAL_COMMUNITY): Payer: Self-pay | Admitting: *Deleted

## 2011-04-08 ENCOUNTER — Emergency Department (INDEPENDENT_AMBULATORY_CARE_PROVIDER_SITE_OTHER)
Admission: EM | Admit: 2011-04-08 | Discharge: 2011-04-08 | Disposition: A | Discharge status: Home / Self Care | Payer: Self-pay | Source: Home / Self Care

## 2011-04-08 DIAGNOSIS — J019 Acute sinusitis, unspecified: Secondary | ICD-10-CM

## 2011-04-08 MED ORDER — FLUTICASONE PROPIONATE 50 MCG/ACT NA SUSP: 2.0000 | Freq: Every day | NASAL | Status: DC

## 2011-04-08 NOTE — ED Notes (Signed)
Pt states he was seen here on Monday and given azithromycin.  States has one dose left.  Post nasal drainage has improved but still coughing up green sputum.  Denies fever.

## 2011-04-08 NOTE — ED Provider Notes (Signed)
History     CSN: [id number] Arrival date & time: 04/08/2011 12:21 PM   First MD Initiated Contact with Patient 04/08/11 1337      Chief Complaint  Patient presents with  . URI  . Follow-up    (Consider location/radiation/quality/duration/timing/severity/associated sxs/prior treatment) Patient is a 50 y.o. male presenting with URI. The history is provided by the spouse. No language interpreter was used.  URI The primary symptoms include sore throat and cough. Primary symptoms do not include fever, fatigue, headaches, ear pain, swollen glands, wheezing, abdominal pain, nausea, vomiting, myalgias, arthralgias or rash. This is a new problem.  The sore throat is not accompanied by trouble swallowing.  Symptoms associated with the illness include congestion and rhinorrhea. The illness is not associated with facial pain or sinus pressure. Risk factors for severe complications from URI include immunosuppression.   Pt seen here 3 days ago thought to have sinusitis started on zpack pt states nasal congestion getting better but c/o cough productive of greenish sputum in am. Able to sleep through night w/o coughing.No wheeze, SOB. taking cough syrup for this with relief. Pt concerned that he needs refill of azithromycin as is about to run out tomorrow. PT HIV (+) last CD4 900, viral count undetectable  Past Medical History  Diagnosis Date  . Diabetes mellitus   . HIV (human immunodeficiency virus infection)     History reviewed. No pertinent past surgical history.  No family history on file.  History  Substance Use Topics  . Smoking status: Never Smoker   . Smokeless tobacco: Never Used  . Alcohol Use: 1.0 oz/week    2 drink(s) per week      Review of Systems  Constitutional: Negative for fever and fatigue.  HENT: Positive for congestion, sore throat, rhinorrhea, sneezing and postnasal drip. Negative for ear pain, mouth sores, trouble swallowing, voice change and sinus pressure.     Respiratory: Positive for cough. Negative for wheezing.   Cardiovascular: Negative for chest pain.  Gastrointestinal: Negative for nausea, vomiting and abdominal pain.  Musculoskeletal: Negative for myalgias and arthralgias.  Skin: Negative for rash.  Neurological: Negative for headaches.    Allergies  Review of patient's allergies indicates no known allergies.  Home Medications   Current Outpatient Rx  Name Route Sig Dispense Refill  . ATORVASTATIN CALCIUM 40 MG PO TABS Oral Take 1 tablet (40 mg total) by mouth daily. 90 tablet 3    PAP arrived.  Lot #[id number] exp 10/2013. Pt notified  . AZITHROMYCIN 250 MG PO TABS Oral Take 1 tablet (250 mg total) by mouth daily. Take first 2 tablets together, then 1 every day until finished. 6 tablet 0  . WAVESENSE PRESTO PRO METER DEVI Does not apply by Does not apply route. 1 meter      . EMTRICITABINE-TENOFOVIR 200-300 MG PO TABS Oral Take 1 tablet by mouth daily. 30 tablet 6  . OMEGA-3 FATTY ACIDS 1000 MG PO CAPS Oral Take 1 g by mouth 3 (three) times daily with meals.      [name] GLIPIZIDE 10 MG PO TABS Oral Take 10 mg by mouth daily.      [name] GLUCOSE BLOOD VI STRP Other 1 each by Other route daily. Use as instructed     . METFORMIN HCL 500 MG PO TABS Oral Take 500 mg by mouth 2 (two) times daily with a meal.      . TUSSIN CF COUGH & COLD PO Oral Take by mouth.      [name]  RALTEGRAVIR POTASSIUM 400 MG PO TABS Oral Take 1 tablet (400 mg total) by mouth 2 (two) times daily. 60 tablet 6  . FLUTICASONE PROPIONATE 50 MCG/ACT NA SUSP Nasal Place 2 sprays into the nose daily. 16 g 2    BP 131/80  Pulse 82  Temp(Src) 98.3 F (36.8 C) (Oral)  Resp 16  SpO2 100%  Physical Exam  Nursing note and vitals reviewed. Constitutional: He is oriented to person, place, and time. He appears well-developed and well-nourished.  HENT:  Head: Normocephalic and atraumatic.  Right Ear: Hearing, tympanic membrane and ear canal normal.  Left Ear: Hearing, tympanic membrane  and ear canal normal.  Nose: Mucosal edema and rhinorrhea present. No sinus tenderness. No epistaxis.  Mouth/Throat: Uvula is midline and mucous membranes are normal. Posterior oropharyngeal erythema present. No oropharyngeal exudate.       No sinus tenderness  Eyes: Conjunctivae and EOM are normal.  Neck: Normal range of motion. Neck supple.  Cardiovascular: Normal rate and regular rhythm.  Exam reveals no gallop and no friction rub.   No murmur heard. Pulmonary/Chest: Effort normal and breath sounds normal. No respiratory distress. He has no wheezes. He has no rales. He exhibits no tenderness.  Abdominal: Soft. Bowel sounds are normal. He exhibits no distension. There is no tenderness.  Musculoskeletal: Normal range of motion. He exhibits no edema and no tenderness.  Lymphadenopathy:    He has no cervical adenopathy.  Neurological: He is alert and oriented to person, place, and time.  Skin: Skin is warm and dry. No rash noted.  Psychiatric: He has a normal mood and affect. His behavior is normal. Judgment and thought content normal.    ED Course  Procedures (including critical care time)  Labs Reviewed - No data to display No results found.   1. Sinusitis acute       MDM         Danella Maiers [facility] 04/08/11 1505

## 2011-04-19 ENCOUNTER — Ambulatory Visit: Payer: Self-pay | Admitting: Adult Health

## 2011-04-25 ENCOUNTER — Ambulatory Visit (INDEPENDENT_AMBULATORY_CARE_PROVIDER_SITE_OTHER): Payer: Self-pay | Admitting: Internal Medicine

## 2011-04-25 ENCOUNTER — Encounter: Payer: Self-pay | Admitting: Internal Medicine

## 2011-04-25 VITALS — BP 139/83 | HR 81 | Temp 98.2°F | Ht 71.0 in | Wt 202.0 lb

## 2011-04-25 DIAGNOSIS — B20 Human immunodeficiency virus [HIV] disease: Secondary | ICD-10-CM

## 2011-04-25 DIAGNOSIS — E785 Hyperlipidemia, unspecified: Secondary | ICD-10-CM

## 2011-04-25 DIAGNOSIS — E119 Type 2 diabetes mellitus without complications: Secondary | ICD-10-CM

## 2011-04-25 DIAGNOSIS — Z23 Encounter for immunization: Secondary | ICD-10-CM

## 2011-04-25 NOTE — Assessment & Plan Note (Signed)
His hemoglobin A1c has been less than 5.7 on his last 2 visits. He seems to be working very hard to bring his diabetes under very good control.

## 2011-04-25 NOTE — Assessment & Plan Note (Signed)
His cholesterol and triglycerides are back in the normal range.

## 2011-04-25 NOTE — Assessment & Plan Note (Signed)
His adherence is excellent and his HIV infection remains under very good control. I will continue his current regimen.

## 2011-04-25 NOTE — Progress Notes (Signed)
  Subjective:    Patient ID: Benjamin Hunter, male    DOB: Aug 23, 1960, 50 y.o.   MRN: [id number]  HPI Mr. Siever is in for his routine visit. He denies missing any doses of his medications. He feels well and has had no problems or concerns since his last visit. He monitors his blood sugars daily and is generally just around 100. He does follow his diabetic diet.    Review of Systems     Objective:   Physical Exam  Constitutional: He appears well-developed and well-nourished. No distress.  HENT:  Mouth/Throat: Oropharynx is clear and moist. No oropharyngeal exudate.  Cardiovascular: Normal rate, regular rhythm and normal heart sounds.   No murmur heard. Pulmonary/Chest: Breath sounds normal. He has no wheezes. He has no rales.  Skin: No rash noted.  Psychiatric: He has a normal mood and affect.   HIV 1 RNA Quant (copies/mL)  Date Value  04/05/2011 <20   12/07/2010 <20   08/30/2010 <20      CD4 T Cell Abs (cmm)  Date Value  04/05/2011 590   12/07/2010 750   08/30/2010 990             Assessment & Plan:

## 2011-05-03 ENCOUNTER — Ambulatory Visit: Payer: Self-pay

## 2011-05-16 ENCOUNTER — Telehealth: Payer: Self-pay | Admitting: Internal Medicine

## 2011-05-16 NOTE — Telephone Encounter (Signed)
Checked status on Lipitor.  Still in process.  I will check back 05-19-11.

## 2011-05-18 ENCOUNTER — Telehealth: Payer: Self-pay | Admitting: *Deleted

## 2011-05-18 NOTE — Telephone Encounter (Signed)
Called patient to advise that his Lipitor 40 mg is here and he can come to pick up asap. Had to leave a message for patient to call office as he was not home at time of call.

## 2011-05-19 ENCOUNTER — Telehealth: Payer: Self-pay | Admitting: Internal Medicine

## 2011-05-19 NOTE — Telephone Encounter (Signed)
Patient's lipitor has been approved and shipped

## 2011-06-07 ENCOUNTER — Ambulatory Visit: Payer: Self-pay

## 2011-06-08 ENCOUNTER — Ambulatory Visit: Payer: Self-pay

## 2011-06-27 ENCOUNTER — Other Ambulatory Visit: Payer: Self-pay | Admitting: *Deleted

## 2011-06-27 DIAGNOSIS — B20 Human immunodeficiency virus [HIV] disease: Secondary | ICD-10-CM

## 2011-06-27 MED ORDER — RALTEGRAVIR POTASSIUM 400 MG PO TABS: 400.0000 mg | ORAL_TABLET | Freq: Two times a day (BID) | ORAL | Status: DC

## 2011-06-27 MED ORDER — EMTRICITABINE-TENOFOVIR DF 200-300 MG PO TABS: 1.0000 | ORAL_TABLET | Freq: Every day | ORAL | Status: DC

## 2011-07-06 ENCOUNTER — Other Ambulatory Visit: Payer: Self-pay | Admitting: *Deleted

## 2011-07-06 DIAGNOSIS — E119 Type 2 diabetes mellitus without complications: Secondary | ICD-10-CM

## 2011-07-06 MED ORDER — METFORMIN HCL 500 MG PO TABS: 500.0000 mg | ORAL_TABLET | Freq: Two times a day (BID) | ORAL | Status: DC

## 2011-07-06 MED ORDER — GLUCOSE BLOOD VI STRP: ORAL_STRIP | Status: DC

## 2011-07-06 MED ORDER — GLIPIZIDE 10 MG PO TABS: 10.0000 mg | ORAL_TABLET | Freq: Every day | ORAL | Status: DC

## 2011-07-06 NOTE — Telephone Encounter (Signed)
Refills.  Test Strips sent to [facility] Assistance [facility] per pt request.

## 2011-07-11 ENCOUNTER — Other Ambulatory Visit (INDEPENDENT_AMBULATORY_CARE_PROVIDER_SITE_OTHER): Payer: Self-pay

## 2011-07-11 DIAGNOSIS — Z79899 Other long term (current) drug therapy: Secondary | ICD-10-CM

## 2011-07-11 DIAGNOSIS — Z113 Encounter for screening for infections with a predominantly sexual mode of transmission: Secondary | ICD-10-CM

## 2011-07-11 DIAGNOSIS — B20 Human immunodeficiency virus [HIV] disease: Secondary | ICD-10-CM

## 2011-07-11 LAB — COMPLETE METABOLIC PANEL WITH GFR
ALT: 31 U/L (ref 0–53)
Albumin: 3.7 g/dL (ref 3.5–5.2)
CO2: 26 mEq/L (ref 19–32)
Calcium: 9.4 mg/dL (ref 8.4–10.5)
Chloride: 105 mEq/L (ref 96–112)
GFR, Est African American: >89 mL/min
GFR, Est Non African American: >89 mL/min
Glucose, Bld: 209 mg/dL — ABNORMAL HIGH (ref 70–99)
Potassium: 4 mEq/L (ref 3.5–5.3)
Sodium: 138 mEq/L (ref 135–145)
Total Bilirubin: 0.8 mg/dL (ref 0.3–1.2)
Total Protein: 7 g/dL (ref 6.0–8.3)

## 2011-07-11 LAB — LIPID PANEL
HDL: 25 mg/dL — ABNORMAL LOW (ref >39)
LDL Cholesterol: 68 mg/dL (ref 0–99)

## 2011-07-11 LAB — CBC
Hemoglobin: 12.2 g/dL — ABNORMAL LOW (ref 13.0–17.0)
MCH: 29.3 pg (ref 26.0–34.0)
MCHC: 32.7 g/dL (ref 30.0–36.0)
Platelets: 177 10*3/uL (ref 150–400)
RDW: 12 % (ref 11.5–15.5)

## 2011-07-11 LAB — RPR

## 2011-07-12 ENCOUNTER — Other Ambulatory Visit: Payer: Self-pay

## 2011-07-13 LAB — HIV-1 RNA QUANT-NO REFLEX-BLD
HIV 1 RNA Quant: 32 copies/mL — ABNORMAL HIGH (ref <20)
HIV-1 RNA Quant, Log: 1.51 {Log} — ABNORMAL HIGH (ref <1.30)

## 2011-07-26 ENCOUNTER — Ambulatory Visit (INDEPENDENT_AMBULATORY_CARE_PROVIDER_SITE_OTHER): Payer: Self-pay | Admitting: Internal Medicine

## 2011-07-26 ENCOUNTER — Encounter: Payer: Self-pay | Admitting: Internal Medicine

## 2011-07-26 VITALS — BP 129/74 | HR 96 | Temp 98.5°F | Ht 71.0 in | Wt 198.5 lb

## 2011-07-26 DIAGNOSIS — B20 Human immunodeficiency virus [HIV] disease: Secondary | ICD-10-CM

## 2011-07-26 NOTE — Progress Notes (Signed)
Patient ID: Benjamin Hunter, male   DOB: 21-Nov-1960, 51 y.o.   MRN: [id number]  INFECTIOUS DISEASE PROGRESS NOTE    Subjective: Benjamin Hunter is in for his routine visit. He developed a "cold" last week with sinus congestion, sore throat and non-productive cough. He feels like he is getting better. He thinks he has only missed 2-3 doses of his meds in the past 6 months. He monitors his blood sugar and it is generally between 100-150.  Objective: Temp: 98.5 F (36.9 C) (02/26 1425) Temp src: Oral (02/26 1425) BP: 129/74 mmHg (02/26 1425) Pulse Rate: 96  (02/26 1425)  General: no distress Oral: mild pharyngeal erythema Skin: no rash Lungs: clear Cor: reg S1 and S2 without murmurs   Lab Results HIV 1 RNA Quant (copies/mL)  Date Value  07/11/2011 32*  04/05/2011 <20   12/07/2010 <20      CD4 T Cell Abs (cmm)  Date Value  07/11/2011 540   04/05/2011 590   12/07/2010 750      Lab Results  Component Value Date   CHOL 109 07/11/2011   HDL 25* 07/11/2011   LDLCALC 68 07/11/2011   TRIG 82 07/11/2011   CHOLHDL 4.4 07/11/2011    Lab Results  Component Value Date   HGBA1C 5.5 04/05/2011    Assessment: Benjamin Hunter's HIV and lipids remain under excellent control and 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  Plan: 1. Continue current meds 2. Return to clinic in 6 months    [name], MD [facility] for Infectious Diseases [facility] [phone] pager   [phone] cell 07/26/2011, 4:13 PM

## 2011-08-15 ENCOUNTER — Encounter (HOSPITAL_COMMUNITY): Payer: Self-pay

## 2011-08-15 ENCOUNTER — Emergency Department (INDEPENDENT_AMBULATORY_CARE_PROVIDER_SITE_OTHER)
Admission: EM | Admit: 2011-08-15 | Discharge: 2011-08-15 | Disposition: A | Discharge status: Home / Self Care | Payer: Self-pay | Source: Home / Self Care | Attending: Emergency Medicine | Admitting: Emergency Medicine

## 2011-08-15 DIAGNOSIS — J019 Acute sinusitis, unspecified: Secondary | ICD-10-CM

## 2011-08-15 MED ORDER — FLUTICASONE PROPIONATE 50 MCG/ACT NA SUSP: 2.0000 | Freq: Every day | NASAL | Status: DC

## 2011-08-15 MED ORDER — AMOXICILLIN 500 MG PO CAPS: 1000.0000 mg | ORAL_CAPSULE | Freq: Three times a day (TID) | ORAL | Status: AC

## 2011-08-15 MED ORDER — GUAIFENESIN-CODEINE 100-10 MG/5ML PO SYRP: 10.0000 mL | ORAL_SOLUTION | Freq: Four times a day (QID) | ORAL | Status: AC | PRN

## 2011-08-15 NOTE — ED Provider Notes (Signed)
Chief Complaint  Patient presents with  . Sinusitis    History of Present Illness:   Benjamin Hunter is a 51 year old HIV-positive male who has had a two-week history of symptoms of sinusitis including nasal congestion, sinus pressure, clear to yellow-green secretions, headache, scratchy throat, sore throat, a cough productive of clear sputum. He denies any fever, chills, shortness of breath, wheezing, or chest pain. He is being followed at the infectious disease clinic by Dr. Orvan Falconer. His latest CD4 count was 540 and his viral titer was undetectable.  Review of Systems:  Other than noted above, the patient denies any of the following symptoms. Systemic:  No fever, chills, sweats, fatigue, myalgias, headache, or anorexia. Eye:  No redness, pain or drainage. ENT:  No earache, nasal congestion, rhinorrhea, sinus pressure, or sore throat. Lungs:  No cough, sputum production, wheezing, shortness of breath. Or chest pain. GI:  No nausea, vomiting, abdominal pain or diarrhea. Skin:  No rash or itching.  PMFSH:  Past medical history, family history, social history, meds, and allergies were reviewed.  Physical Exam:   Vital signs:  BP 148/81  Pulse 74  Temp(Src) 98.2 F (36.8 C) (Oral)  Resp 18  SpO2 98% General:  Alert, in no distress. Eye:  No conjunctival injection or drainage. ENT:  TMs and canals were normal, without erythema or inflammation.  Nasal mucosa was clear and uncongested, without drainage.  Mucous membranes were moist.  Pharynx was clear, without exudate or drainage.  There were no oral ulcerations or lesions. Neck:  Supple, no adenopathy, tenderness or mass. Lungs:  No respiratory distress.  Lungs were clear to auscultation, without wheezes, rales or rhonchi.  Breath sounds were clear and equal bilaterally. Heart:  Regular rhythm, without gallops, murmers or rubs. Skin:  Clear, warm, and dry, without rash or lesions.   Assessment:   Diagnoses that have been ruled out:  None    Diagnoses that are still under consideration:  None  Final diagnoses:  Acute sinusitis      Plan:   1.  The following meds were prescribed:   New Prescriptions   AMOXICILLIN (AMOXIL) 500 MG CAPSULE    Take 2 capsules (1,000 mg total) by mouth 3 (three) times daily.   FLUTICASONE (FLONASE) 50 MCG/ACT NASAL SPRAY    Place 2 sprays into the nose daily.   GUAIFENESIN-CODEINE (GUIATUSS AC) 100-10 MG/5ML SYRUP    Take 10 mLs by mouth 4 (four) times daily as needed for cough.   2.  The patient was instructed in symptomatic care and handouts were given. 3.  The patient was told to return if becoming worse in any way, if no better in 3 or 4 days, and given some red flag symptoms that would indicate earlier return.   Reuben Likes, MD 08/15/11 [phone]

## 2011-08-15 NOTE — Discharge Instructions (Signed)

## 2011-08-15 NOTE — ED Notes (Signed)
2 week duration of sinus issues; was told by his MD to use an OTC type sinus spray, which reportedly made things worse

## 2011-09-06 ENCOUNTER — Encounter: Payer: Self-pay | Admitting: Internal Medicine

## 2011-09-06 ENCOUNTER — Ambulatory Visit (INDEPENDENT_AMBULATORY_CARE_PROVIDER_SITE_OTHER): Payer: Self-pay | Admitting: Internal Medicine

## 2011-09-06 VITALS — BP 129/80 | HR 96 | Temp 98.5°F | Wt 199.0 lb

## 2011-09-06 DIAGNOSIS — M545 Low back pain: Secondary | ICD-10-CM

## 2011-09-06 DIAGNOSIS — M130 Polyarthritis, unspecified: Secondary | ICD-10-CM

## 2011-09-06 DIAGNOSIS — E119 Type 2 diabetes mellitus without complications: Secondary | ICD-10-CM

## 2011-09-06 LAB — SEDIMENTATION RATE: Sed Rate: 45 mm/hr — ABNORMAL HIGH (ref 0–16)

## 2011-09-06 LAB — C-REACTIVE PROTEIN: CRP: 1.41 mg/dL — ABNORMAL HIGH (ref <0.60)

## 2011-09-06 LAB — RHEUMATOID FACTOR: Rhuematoid fact SerPl-aCnc: 32 IU/mL — ABNORMAL HIGH (ref <=14)

## 2011-09-06 MED ORDER — NAPROXEN 500 MG PO TABS: 500.0000 mg | ORAL_TABLET | Freq: Two times a day (BID) | ORAL | Status: DC

## 2011-09-06 MED ORDER — CYCLOBENZAPRINE HCL 5 MG PO TABS: 5.0000 mg | ORAL_TABLET | Freq: Three times a day (TID) | ORAL | Status: DC | PRN

## 2011-09-06 NOTE — Patient Instructions (Signed)
Please follow up in 1 month.  

## 2011-09-06 NOTE — Progress Notes (Signed)
Patient ID: Benjamin Hunter, male   DOB: 1960-12-09, 51 y.o.   MRN: [id number]  INFECTIOUS DISEASE PROGRESS NOTE    Subjective: Benjamin Hunter is in for his routine visit. He never misses a single dose of his HIV medications. He occasionally will miss a dose of his metformin because it can upset his stomach. He estimates his miss 3-4 doses since his last visit. His blood sugars tend to run in the 110-125 range each day.  He recently had an upper respiratory infection with severe sinus congestion. He was seen at the urgent care Center and placed on amoxicillin and has improved. He is also suffering with his seasonal allergies.  He recently strained his right lower back when he was helping move his disabled mother in bed. He has had sharp stabbing pains when rolling over in bed for the past 3 weeks. He has not noticed any rash or any dysuria. His also had more problems with pain and swelling of his joints particularly of his right hand and right knee. He has taken occasional aspirin without relief.  Objective: Temp: 98.5 F (36.9 C) (04/09 0858) Temp src: Oral (04/09 0858) BP: 129/80 mmHg (04/09 0858) Pulse Rate: 96  (04/09 0858)  General: He is in good spirits Skin: No rash Lungs: Clear Cor: Regular S1 and S2 with no murmurs Abdomen: Soft and nontender He has right mid back tenderness with palpation. No rash, redness or swelling is noted. Joints: He has some swelling and tenderness of his metacarpal joints right greater than left and some mild swelling of his right knee. His knee is not particularly warm or tender with range of motion.  Lab Results HIV 1 RNA Quant (copies/mL)  Date Value  07/11/2011 32*  04/05/2011 <20   12/07/2010 <20      CD4 T Cell Abs (cmm)  Date Value  07/11/2011 540   04/05/2011 590   12/07/2010 750      Assessment: His HIV infection remains under excellent control. I will continue his current regimen.  His cholesterol panel is under good control.  I will check  a hemoglobin A1c today.  I will give him some Flexeril and Naprosyn for his low back pain and  use the Naprosyn for nonspecific polyarthritis pending some blood work.  Plan: 1. Continue current medications 2. Check hemoglobin A1c 3. Check sedimentation rate, C-reactive protein, ANA and rheumatoid factor 4. Start as needed Naprosyn and Flexeril   [name], MD [facility] for Infectious Diseases [facility] [phone] pager   [phone] cell 09/06/2011, 9:29 AM

## 2011-09-07 LAB — ANA: Anti Nuclear Antibody(ANA): NEGATIVE

## 2011-09-12 ENCOUNTER — Telehealth (HOSPITAL_COMMUNITY): Payer: Self-pay | Admitting: *Deleted

## 2011-09-12 NOTE — ED Notes (Addendum)
Pt. called in and asked what his diagnosis was and the date he was seen. Pt. verified with DOB.   I told him acute sinusitis and he was here 08/15/11. I asked pt. how he is doing?  He said he was better. Vassie Moselle 09/12/2011

## 2011-09-19 ENCOUNTER — Telehealth: Payer: Self-pay | Admitting: *Deleted

## 2011-09-19 NOTE — Telephone Encounter (Signed)
States he saw Dr. Orvan Falconer within the past 2 weeks & was given a med for his arthritis pain. It is not helping much. States his hands, wrists, shoulders hurt a lot. Also has peeling skin on his legs & arms for the past 3 weeks. States lotions do not help. Wants to show the md his peeling skin. Asked about lab results. ( there are some abnormals)  I told him md will be here later today & I will call him back with his response. His cell is [phone] & home is [phone]. Transferred to front to make an appt .

## 2011-09-20 ENCOUNTER — Encounter: Payer: Self-pay | Admitting: Internal Medicine

## 2011-09-20 ENCOUNTER — Ambulatory Visit (INDEPENDENT_AMBULATORY_CARE_PROVIDER_SITE_OTHER): Payer: Self-pay | Admitting: Internal Medicine

## 2011-09-20 VITALS — BP 144/90 | HR 91 | Temp 98.3°F | Ht 71.0 in | Wt 200.0 lb

## 2011-09-20 DIAGNOSIS — J309 Allergic rhinitis, unspecified: Secondary | ICD-10-CM

## 2011-09-20 DIAGNOSIS — J302 Other seasonal allergic rhinitis: Secondary | ICD-10-CM

## 2011-09-20 DIAGNOSIS — M130 Polyarthritis, unspecified: Secondary | ICD-10-CM

## 2011-09-20 DIAGNOSIS — B20 Human immunodeficiency virus [HIV] disease: Secondary | ICD-10-CM

## 2011-09-20 LAB — COMPREHENSIVE METABOLIC PANEL
ALT: 25 U/L (ref 0–53)
AST: 30 U/L (ref 0–37)
Alkaline Phosphatase: 108 U/L (ref 39–117)
Creat: 1.14 mg/dL (ref 0.50–1.35)
Sodium: 135 mEq/L (ref 135–145)
Total Bilirubin: 0.4 mg/dL (ref 0.3–1.2)
Total Protein: 8.1 g/dL (ref 6.0–8.3)

## 2011-09-20 LAB — CBC
MCH: 28.5 pg (ref 26.0–34.0)
MCHC: 32.6 g/dL (ref 30.0–36.0)
MCV: 87.6 fL (ref 78.0–100.0)
Platelets: 229 10*3/uL (ref 150–400)
RBC: 4.45 MIL/uL (ref 4.22–5.81)
RDW: 14 % (ref 11.5–15.5)

## 2011-09-20 LAB — C-REACTIVE PROTEIN: CRP: 1.58 mg/dL — ABNORMAL HIGH (ref <0.60)

## 2011-09-20 LAB — LIPID PANEL
Cholesterol: 183 mg/dL (ref 0–200)
LDL Cholesterol: 124 mg/dL — ABNORMAL HIGH (ref 0–99)
Total CHOL/HDL Ratio: 4.8 Ratio
VLDL: 21 mg/dL (ref 0–40)

## 2011-09-20 MED ORDER — ACETAMINOPHEN 500 MG PO TABS: 1000.0000 mg | ORAL_TABLET | Freq: Four times a day (QID) | ORAL | Status: DC | PRN

## 2011-09-20 NOTE — Progress Notes (Signed)
Patient ID: Benjamin Hunter, male   DOB: [birth year], 51 y.o.   MRN: [id number]  INFECTIOUS DISEASE PROGRESS NOTE    Subjective: Benjamin Hunter is in for a work in visit. He continues to have severe pain and stiffness in his hands, wrists and elbows. The discomfort is less so in his shoulders and knees. He has been having this problem for about 3-4 weeks. He has never had it before. He states that the pain and stiffness is worse in the early morning and late evening.  His low back pain improved after starting Flexeril at the time of his last visit. He does not feel like the Naprosyn has helped his joint pain. He is adopted and does not know the medical history is of his birth parents.   Objective: Temp: 98.3 F (36.8 C) (04/23 1519) Temp src: Oral (04/23 1519) BP: 144/90 mmHg (04/23 1519) Pulse Rate: 91  (04/23 1519)  General: He is in good spirits Skin: He has very dry skin over his elbows and over his anterior shins that is new over the past month Lungs: Clear Cor: Regular S1 and S2 no murmurs Abdomen: Nontender Joints: His PIP and MCP joints are stiff and slightly uncomfortable with range of motion. I do not see any other signs of implantation of his joints  Lab Results HIV 1 RNA Quant (copies/mL)  Date Value  07/11/2011 32*  04/05/2011 <20   12/07/2010 <20      CD4 T Cell Abs (cmm)  Date Value  07/11/2011 540   04/05/2011 590   12/07/2010 750      Lab Results  Component Value Date   CRP 1.41* 09/06/2011    Lab Results  Component Value Date   ESRSEDRATE 45* 09/06/2011    Rheumatoid factor    32 H  Assessment: Tenderness HIV infection remains under excellent control. He has developed acute polyarthralgia and dry skin which are both quite nonspecific. His inflammatory markers and rheumatoid factor are elevated suggesting that this could be early rheumatoid arthritis. However I cannot rule out other causes of polyarthralgia polyarthritis at this stage. I will have him stop the  Flexeril he was taking for low back pain and continue Naprosyn along with some supplemental acetaminophen. I do not want to start empiric steroids or DMARDs yet.  Plan: 1. Check lab work again today 2. Continue Naprosyn and add acetaminophen 3. Followup on May 9   [name], MD [facility] for Infectious Diseases [facility] [phone] pager   [phone] cell 09/20/2011, 4:37 PM

## 2011-09-21 LAB — SEDIMENTATION RATE: Sed Rate: 32 mm/hr — ABNORMAL HIGH (ref 0–16)

## 2011-09-22 LAB — HIV-1 RNA QUANT-NO REFLEX-BLD: HIV 1 RNA Quant: <20 copies/mL (ref <20)

## 2011-10-06 ENCOUNTER — Encounter: Payer: Self-pay | Admitting: Internal Medicine

## 2011-10-06 ENCOUNTER — Other Ambulatory Visit: Payer: Self-pay | Admitting: Internal Medicine

## 2011-10-06 ENCOUNTER — Ambulatory Visit (INDEPENDENT_AMBULATORY_CARE_PROVIDER_SITE_OTHER): Payer: Self-pay | Admitting: Internal Medicine

## 2011-10-06 ENCOUNTER — Ambulatory Visit
Admission: RE | Admit: 2011-10-06 | Discharge: 2011-10-06 | Disposition: A | Discharge status: Home / Self Care | Payer: Self-pay | Source: Ambulatory Visit | Attending: Internal Medicine | Admitting: Internal Medicine

## 2011-10-06 VITALS — BP 150/97 | HR 86 | Temp 97.9°F | Ht 71.0 in | Wt 200.5 lb

## 2011-10-06 DIAGNOSIS — M12559 Traumatic arthropathy, unspecified hip: Secondary | ICD-10-CM

## 2011-10-06 DIAGNOSIS — E119 Type 2 diabetes mellitus without complications: Secondary | ICD-10-CM

## 2011-10-06 DIAGNOSIS — M130 Polyarthritis, unspecified: Secondary | ICD-10-CM

## 2011-10-06 DIAGNOSIS — B20 Human immunodeficiency virus [HIV] disease: Secondary | ICD-10-CM

## 2011-10-06 NOTE — Progress Notes (Signed)
Addended by: Jennet Maduro D on: 10/06/2011 04:08 PM   Modules accepted: Orders

## 2011-10-06 NOTE — Progress Notes (Signed)
Addended by: Jennet Maduro D on: 10/06/2011 11:10 AM   Modules accepted: Orders

## 2011-10-06 NOTE — Progress Notes (Signed)
Patient ID: Benjamin Hunter, male   DOB: 09-Mar-1961, 51 y.o.   MRN: [id number]  INFECTIOUS DISEASE PROGRESS NOTE    Subjective: Benjamin Hunter is in for his routine followup visit. He states that his joint pain and stiffness is probably a little bit worse. It does limit his activity. It is most noticeable in his hands and fingers but also in both shoulders, both elbows and to a lesser degree his knees. He has a little bit of relief with alternating Naprosyn and acetaminophen. He has not had any fever. He still bothered by dry skin on his forearms and shins.  Objective: Temp: 97.9 F (36.6 C) (05/09 1031) Temp src: Oral (05/09 1031) BP: 150/97 mmHg (05/09 1031) Pulse Rate: 86  (05/09 1031)  General: He appears a little worried and uncomfortable Skin: Nonspecific dry skin over his forearms and shins. No nodules are palpable Lungs: Clear Cor: Regular S1 and S2 and no murmurs Abdomen: Soft nontender Joints: He is a little bit of symmetrical swelling over his PIP joints with limited range of motion due to stiffness.  Lab Results HIV 1 RNA Quant (copies/mL)  Date Value  09/20/2011 <20   07/11/2011 32*  04/05/2011 <20      CD4 T Cell Abs (cmm)  Date Value  09/20/2011 610   07/11/2011 540   04/05/2011 590      Assessment: Is still unclear what is causing his polyarthritis but he does need some criteria for early rheumatoid arthritis with symmetrical joint involvement, particularly of his hands, stiffness, and an elevated rheumatoid factor. I will obtain an anti-CCP antibody and also check his hepatitis C antibody. I will also try to arrange rheumatology evaluation.  His dry skin is quite nonspecific and I'm not sure how it relates to his acute polyarthritis. It does not look like psoriasis.  Plan: 1. Check anti-CCP and hepatitis C antibody 2. Hand x-rays 3. Check hemoglobin A1c 4. Rheumatology referral   [name], MD [facility] for Infectious Diseases [facility] [phone] pager   [phone] cell 10/06/2011, 11:01 AM

## 2011-10-07 ENCOUNTER — Other Ambulatory Visit: Payer: Self-pay

## 2011-10-07 LAB — POCT GLYCOSYLATED HEMOGLOBIN (HGB A1C): Hemoglobin A1C: 5

## 2011-10-07 LAB — HEPATITIS C ANTIBODY: HCV Ab: NEGATIVE

## 2011-10-07 LAB — CYCLIC CITRUL PEPTIDE ANTIBODY, IGG: Cyclic Citrullin Peptide Ab: <2 U/mL (ref 0.0–5.0)

## 2011-10-31 ENCOUNTER — Encounter: Payer: Self-pay | Admitting: *Deleted

## 2011-10-31 NOTE — Progress Notes (Signed)
Patient ID: Benjamin Hunter, male   DOB: [birth year], 51 y.o.   MRN: [id number]  Referral made for Rheumatology. Faxed to [facility] [phone]) and was told that this specialist is not available this month and does not know if available next month. Tacey Heap RN

## 2011-11-02 ENCOUNTER — Ambulatory Visit (INDEPENDENT_AMBULATORY_CARE_PROVIDER_SITE_OTHER): Payer: Self-pay | Admitting: Internal Medicine

## 2011-11-02 ENCOUNTER — Encounter: Payer: Self-pay | Admitting: Internal Medicine

## 2011-11-02 VITALS — BP 134/90 | HR 80 | Temp 97.9°F | Ht 71.0 in | Wt 197.2 lb

## 2011-11-02 DIAGNOSIS — L738 Other specified follicular disorders: Secondary | ICD-10-CM

## 2011-11-02 DIAGNOSIS — L678 Other hair color and hair shaft abnormalities: Secondary | ICD-10-CM

## 2011-11-02 DIAGNOSIS — L739 Follicular disorder, unspecified: Secondary | ICD-10-CM | POA: Insufficient documentation

## 2011-11-02 MED ORDER — TRIAMCINOLONE ACETONIDE 0.5 % EX CREA: TOPICAL_CREAM | Freq: Three times a day (TID) | CUTANEOUS | Status: DC

## 2011-11-02 NOTE — Progress Notes (Signed)
Patient ID: Benjamin Hunter, male   DOB: 13-Jun-1960, 51 y.o.   MRN: [id number]     [facility] for Infectious Disease  Patient Active Problem List  Diagnoses  . HIV DISEASE  . DM  . HYPERLIPIDEMIA  . EXTERNAL HEMORRHOIDS WITHOUT MENTION COMP  . ELEVATED BLOOD PRESSURE  . Polyarthritis  . Seasonal allergies  . Folliculitis    Patient's Medications  New Prescriptions   TRIAMCINOLONE CREAM (KENALOG) 0.5 %    Apply topically 3 (three) times daily.  Previous Medications   ACETAMINOPHEN (TYLENOL) 500 MG TABLET    Take 2 tablets (1,000 mg total) by mouth every 6 (six) hours as needed for pain.   ATORVASTATIN (LIPITOR) 40 MG TABLET    Take 1 tablet (40 mg total) by mouth daily.   BLOOD GLUCOSE MONITORING SUPPL (WAVESENSE PRESTO PRO METER) DEVI    by Does not apply route. 1 meter     EMTRICITABINE-TENOFOVIR (TRUVADA) 200-300 MG PER TABLET    Take 1 tablet by mouth daily.   FISH OIL-OMEGA-3 FATTY ACIDS 1000 MG CAPSULE    Take 1 g by mouth 3 (three) times daily with meals.     FLUTICASONE (FLONASE) 50 MCG/ACT NASAL SPRAY    Place 2 sprays into the nose daily.   GLIPIZIDE (GLUCOTROL) 10 MG TABLET    Take 1 tablet (10 mg total) by mouth daily.   GLUCOSE BLOOD (WAVESENSE PRESTO TEST) TEST STRIP    Use as directed   METFORMIN (GLUCOPHAGE) 500 MG TABLET    Take 1 tablet (500 mg total) by mouth 2 (two) times daily with a meal.   NAPROXEN (NAPROSYN) 500 MG TABLET    Take 1 tablet (500 mg total) by mouth 2 (two) times daily with a meal.   RALTEGRAVIR (ISENTRESS) 400 MG TABLET    Take 1 tablet (400 mg total) by mouth 2 (two) times daily.  Modified Medications   No medications on file  Discontinued Medications   No medications on file    Subjective: Benjamin Hunter is seen on a work in basis. He states that his problem with dry skin is much worse and he is now having intense itching on his forearms and shins. He is hand stiffness and pain may be slightly better in his right hand but is unchanged in  his left hand and other joints. His also noticed a new sore on his lower gum line as of this morning. He has not had any trauma to the area. He thinks he may have had a similar lesion many years ago.  Objective: Temp: 97.9 F (36.6 C) (06/05 1423) Temp src: Oral (06/05 1423) BP: 134/90 mmHg (06/05 1423) Pulse Rate: 80  (06/05 1423)  General: He is in no distress Skin: He has what appears to be a follicular-based papules on his forearms with a few very tiny pustules. The legs over his anterior shin and ankles appears to be much drier with big plaque like scales. Lungs: Clear Cor: Regular S1 and S2 no murmurs Abdomen: Nontender No change in the swelling of his PIP joints  Lab Results HIV 1 RNA Quant (copies/mL)  Date Value  09/20/2011 <20   07/11/2011 32*  04/05/2011 <20      CD4 T Cell Abs (cmm)  Date Value  09/20/2011 610   07/11/2011 540   04/05/2011 590      Assessment: Benjamin Hunter subacute polyarthritis and skin rash continued to remain somewhat of a mystery. The lesions on his forearms appear to be  folliculitis and I will treat him with topical triamcinolone cream and oral diphenhydramine.  Plan: 1. Continue current medications 2. Triamcinolone 0.5% cream and oral diphenhydramine for his rash and itching 3. Followup on June 20   [name], MD [facility] for Infectious Disease [facility] [phone] pager   [phone] cell 11/02/2011, 2:45 PM

## 2011-11-17 ENCOUNTER — Ambulatory Visit: Payer: Self-pay

## 2011-11-17 ENCOUNTER — Encounter: Payer: Self-pay | Admitting: Internal Medicine

## 2011-11-17 ENCOUNTER — Ambulatory Visit (INDEPENDENT_AMBULATORY_CARE_PROVIDER_SITE_OTHER): Payer: Self-pay | Admitting: Internal Medicine

## 2011-11-17 VITALS — BP 148/87 | HR 93 | Temp 98.0°F | Ht 71.0 in | Wt 201.0 lb

## 2011-11-17 DIAGNOSIS — B20 Human immunodeficiency virus [HIV] disease: Secondary | ICD-10-CM

## 2011-11-17 NOTE — Progress Notes (Signed)
Patient ID: Benjamin Hunter, male   DOB: 08-21-1960, 51 y.o.   MRN: [id number]     [name] for Infectious Disease  Patient Active Problem List  Diagnosis  . HIV DISEASE  . DM  . HYPERLIPIDEMIA  . EXTERNAL HEMORRHOIDS WITHOUT MENTION COMP  . ELEVATED BLOOD PRESSURE  . Polyarthritis  . Seasonal allergies  . Folliculitis    Patient's Medications  New Prescriptions   No medications on file  Previous Medications   ACETAMINOPHEN (TYLENOL) 500 MG TABLET    Take 2 tablets (1,000 mg total) by mouth every 6 (six) hours as needed for pain.   ATORVASTATIN (LIPITOR) 40 MG TABLET    Take 1 tablet (40 mg total) by mouth daily.   BLOOD GLUCOSE MONITORING SUPPL (WAVESENSE PRESTO PRO METER) DEVI    by Does not apply route. 1 meter     EMTRICITABINE-TENOFOVIR (TRUVADA) 200-300 MG PER TABLET    Take 1 tablet by mouth daily.   FISH OIL-OMEGA-3 FATTY ACIDS 1000 MG CAPSULE    Take 1 g by mouth 3 (three) times daily with meals.     FLUTICASONE (FLONASE) 50 MCG/ACT NASAL SPRAY    Place 2 sprays into the nose daily.   GLIPIZIDE (GLUCOTROL) 10 MG TABLET    Take 1 tablet (10 mg total) by mouth daily.   GLUCOSE BLOOD (WAVESENSE PRESTO TEST) TEST STRIP    Use as directed   METFORMIN (GLUCOPHAGE) 500 MG TABLET    Take 1 tablet (500 mg total) by mouth 2 (two) times daily with a meal.   NAPROXEN (NAPROSYN) 500 MG TABLET    Take 1 tablet (500 mg total) by mouth 2 (two) times daily with a meal.   RALTEGRAVIR (ISENTRESS) 400 MG TABLET    Take 1 tablet (400 mg total) by mouth 2 (two) times daily.   TRIAMCINOLONE CREAM (KENALOG) 0.5 %    Apply topically 3 (three) times daily.  Modified Medications   No medications on file  Discontinued Medications   No medications on file    Subjective: Benjamin Hunter is in for his routine visit. His rash improved with triamcinolone cream. The lesion on his lower gumm healed shortly after his last visit. His joint pain and swelling is a little bit better. He denies missing any  of his medications. He states that his blood sugars have been good recently been generally under 130. He tries to exercise but has been walking only about twice weekly.  Objective: Temp: 98 F (36.7 C) (06/20 1426) Temp src: Oral (06/20 1426) BP: 148/87 mmHg (06/20 1426) Pulse Rate: 93  (06/20 1426)  General: He is in good spirits. His body mass index is 28. Skin: His forearm rash has resolved. He continues to have dry scaly skin on his shins Oral: No lesions Lungs: Clear Cor: Regular S1 and S2 and no murmurs Abdomen: Soft and nontender Joints: He has a little bit of swelling of his MCP joints. This is unchanged  Lab Results HIV 1 RNA Quant (copies/mL)  Date Value  09/20/2011 <20   07/11/2011 32*  04/05/2011 <20      CD4 T Cell Abs (cmm)  Date Value  09/20/2011 610   07/11/2011 540   04/05/2011 590      Assessment: His HIV remains under very good control.  His blood pressure is slightly elevated and his LDL cholesterol has come up slightly. I talked to him about lifestyle modification.  His mild polyarthritis seems to be a little better. They're still  no definitive cause for this.  His folliculitis improved with triamcinolone cream  Plan: 1. Continue current medications 2. Followup after lab work in 3 months   [name], MD [facility] for Infectious Disease [facility] [phone] pager   [phone] cell 11/17/2011, 2:42 PM

## 2011-11-28 NOTE — Progress Notes (Signed)
Patient ID: Benjamin Hunter, male   DOB: 1960-12-20, 51 y.o.   MRN: [id number]  Partnership for [facility] is not offering a Rheumatology slot for the month of July. Can not refer pt for eval of polyarthritis. Tacey Heap RN

## 2011-12-05 ENCOUNTER — Telehealth: Payer: Self-pay | Admitting: Internal Medicine

## 2011-12-05 NOTE — Telephone Encounter (Signed)
Called [facility] to check status on Benjamin Hunter enrollment.  It has been extended until 05-29-12.  After that  Lipitor will no longer be offered by [employer].  Called patient and informed him of that.  He will check to see what the cost is through [place].

## 2011-12-23 ENCOUNTER — Telehealth: Payer: Self-pay | Admitting: *Deleted

## 2011-12-23 NOTE — Telephone Encounter (Signed)
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  Pt's employer is asking for the completed paperwork.  It has been about 4 weeks since the pt dropped off the paperwork.  Pt anxious to have the paperwork mailed back to his employer.  The pt provided an envelop to mail the [facility] paperwork.

## 2011-12-26 ENCOUNTER — Telehealth: Payer: Self-pay | Admitting: *Deleted

## 2011-12-26 NOTE — Telephone Encounter (Signed)
Completed Disability Paperwork.  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  Pt notified.  Paperwork copied and placed to be scanned.  Original mailed to [facility] [employer] in envelope provided.

## 2012-01-09 ENCOUNTER — Ambulatory Visit: Payer: Self-pay

## 2012-02-01 ENCOUNTER — Other Ambulatory Visit: Payer: Self-pay

## 2012-02-01 ENCOUNTER — Ambulatory Visit: Payer: Self-pay

## 2012-02-01 DIAGNOSIS — B20 Human immunodeficiency virus [HIV] disease: Secondary | ICD-10-CM

## 2012-02-01 LAB — LIPID PANEL
Cholesterol: 158 mg/dL (ref 0–200)
HDL: 30 mg/dL — ABNORMAL LOW (ref >39)
Total CHOL/HDL Ratio: 5.3 Ratio
Triglycerides: 113 mg/dL (ref <150)

## 2012-02-02 LAB — HIV-1 RNA QUANT-NO REFLEX-BLD
HIV 1 RNA Quant: <20 copies/mL (ref <20)
HIV-1 RNA Quant, Log: <1.3 {Log} (ref <1.30)

## 2012-02-16 ENCOUNTER — Encounter: Payer: Self-pay | Admitting: Internal Medicine

## 2012-02-16 ENCOUNTER — Ambulatory Visit (INDEPENDENT_AMBULATORY_CARE_PROVIDER_SITE_OTHER): Payer: Self-pay | Admitting: Internal Medicine

## 2012-02-16 VITALS — BP 138/88 | HR 69 | Temp 98.6°F | Ht 71.0 in | Wt 207.0 lb

## 2012-02-16 DIAGNOSIS — B20 Human immunodeficiency virus [HIV] disease: Secondary | ICD-10-CM

## 2012-02-16 DIAGNOSIS — E119 Type 2 diabetes mellitus without complications: Secondary | ICD-10-CM

## 2012-02-16 DIAGNOSIS — Z23 Encounter for immunization: Secondary | ICD-10-CM

## 2012-02-16 LAB — HEMOGLOBIN A1C: Mean Plasma Glucose: 103 mg/dL (ref <117)

## 2012-02-16 MED ORDER — METFORMIN HCL 500 MG PO TABS: 500.0000 mg | ORAL_TABLET | Freq: Every day | ORAL | Status: DC

## 2012-02-16 NOTE — Progress Notes (Signed)
Patient ID: Benjamin Hunter, male   DOB: January 28, 1961, 51 y.o.   MRN: [id number]     [facility] for Infectious Disease  Patient Active Problem List  Diagnosis  . HIV DISEASE  . DM  . HYPERLIPIDEMIA  . EXTERNAL HEMORRHOIDS WITHOUT MENTION COMP  . ELEVATED BLOOD PRESSURE  . Polyarthritis  . Seasonal allergies  . Folliculitis    Patient's Medications  New Prescriptions   No medications on file  Previous Medications   ACETAMINOPHEN (TYLENOL) 500 MG TABLET    Take 2 tablets (1,000 mg total) by mouth every 6 (six) hours as needed for pain.   ATORVASTATIN (LIPITOR) 40 MG TABLET    Take 1 tablet (40 mg total) by mouth daily.   BLOOD GLUCOSE MONITORING SUPPL (WAVESENSE PRESTO PRO METER) DEVI    by Does not apply route. 1 meter     EMTRICITABINE-TENOFOVIR (TRUVADA) 200-300 MG PER TABLET    Take 1 tablet by mouth daily.   FISH OIL-OMEGA-3 FATTY ACIDS 1000 MG CAPSULE    Take 1 g by mouth 3 (three) times daily with meals.     FLUTICASONE (FLONASE) 50 MCG/ACT NASAL SPRAY    Place 2 sprays into the nose daily.   GLIPIZIDE (GLUCOTROL) 10 MG TABLET    Take 1 tablet (10 mg total) by mouth daily.   GLUCOSE BLOOD (WAVESENSE PRESTO TEST) TEST STRIP    Use as directed   NAPROXEN (NAPROSYN) 500 MG TABLET    Take 1 tablet (500 mg total) by mouth 2 (two) times daily with a meal.   RALTEGRAVIR (ISENTRESS) 400 MG TABLET    Take 1 tablet (400 mg total) by mouth 2 (two) times daily.   TRIAMCINOLONE CREAM (KENALOG) 0.5 %    Apply topically 3 (three) times daily.  Modified Medications   Modified Medication Previous Medication   METFORMIN (GLUCOPHAGE) 500 MG TABLET metFORMIN (GLUCOPHAGE) 500 MG tablet      Take 1 tablet (500 mg total) by mouth daily with breakfast.    Take 1 tablet (500 mg total) by mouth 2 (two) times daily with a meal.  Discontinued Medications   No medications on file    Subjective: [name] is in for his routine visit. He denies missing any doses of his Truvada or Isentress since his  last visit. He has had no recurrence of his rash and his joint pains have improved. He has been bothered by diarrhea, usually in the late afternoon. He associates that with his metformin and on several occasions recently he has not taken his evening dose. On those days he noted improvement with less diarrhea and said that his blood sugars continued to remain good, almost always below 100.  Objective: Temp: 98.6 F (37 C) (09/19 0927) Temp src: Oral (09/19 0927) BP: 138/88 mmHg (09/19 0927) Pulse Rate: 69  (09/19 0927)  General: He is in good spirits Skin: His rash has resolved Lungs: Clear Cor: Regular S1 and S2 and no murmur Abdomen: Soft and nontender No acute inflammation of his joints  Lab Results HIV 1 RNA Quant (copies/mL)  Date Value  02/01/2012 <20   09/20/2011 <20   07/11/2011 32*     CD4 T Cell Abs (cmm)  Date Value  02/01/2012 590   09/20/2011 610   07/11/2011 540      Assessment: His HIV infection remains under excellent control. I will continue his current antiretroviral regimen.  His joint pain and swelling seems to be improving spontaneously.  His hemoglobin A1c is  5.4. I will let him reduce his dose of metformin to 500 mg once daily with breakfast. He does to call me if his blood sugars are much higher.  Plan: 1. Continue Truvada and Isentress 2. Decrease metformin to 500 mg with breakfast 3. Follow up after lab work in 3 months   [name], MD [facility] for Infectious Disease [facility] [phone] pager   [phone] cell 02/16/2012, 11:34 AM

## 2012-02-23 ENCOUNTER — Other Ambulatory Visit: Payer: Self-pay | Admitting: Internal Medicine

## 2012-05-07 ENCOUNTER — Telehealth: Payer: Self-pay | Admitting: Internal Medicine

## 2012-05-07 NOTE — Telephone Encounter (Signed)
Called Benjamin Hunter and told him he could get his Lipitor through [facility] and he needs to call Triage and get a prescription sent to his pharmacy.  Will inform [facility] that he no longer needs assistance.

## 2012-05-15 ENCOUNTER — Other Ambulatory Visit (INDEPENDENT_AMBULATORY_CARE_PROVIDER_SITE_OTHER): Payer: No Typology Code available for payment source

## 2012-05-15 DIAGNOSIS — B20 Human immunodeficiency virus [HIV] disease: Secondary | ICD-10-CM

## 2012-05-16 ENCOUNTER — Other Ambulatory Visit: Payer: Self-pay

## 2012-05-16 LAB — T-HELPER CELL (CD4) - (RCID CLINIC ONLY): CD4 % Helper T Cell: 40 % (ref 33–55)

## 2012-05-17 LAB — HIV-1 RNA QUANT-NO REFLEX-BLD
HIV 1 RNA Quant: <20 copies/mL (ref <20)
HIV-1 RNA Quant, Log: <1.3 {Log} (ref <1.30)

## 2012-06-05 ENCOUNTER — Encounter: Payer: Self-pay | Admitting: Internal Medicine

## 2012-06-05 ENCOUNTER — Ambulatory Visit (INDEPENDENT_AMBULATORY_CARE_PROVIDER_SITE_OTHER): Payer: Self-pay | Admitting: Internal Medicine

## 2012-06-05 VITALS — BP 137/92 | HR 81 | Temp 97.8°F | Ht 71.0 in | Wt 212.5 lb

## 2012-06-05 DIAGNOSIS — E119 Type 2 diabetes mellitus without complications: Secondary | ICD-10-CM

## 2012-06-05 DIAGNOSIS — B20 Human immunodeficiency virus [HIV] disease: Secondary | ICD-10-CM

## 2012-06-05 NOTE — Progress Notes (Signed)
Patient ID: Benjamin Hunter, male   DOB: Aug 20, 1960, 52 y.o.   MRN: [id number]     [facility] for Infectious Disease  Patient Active Problem List  Diagnosis  . HIV DISEASE  . DM  . HYPERLIPIDEMIA  . EXTERNAL HEMORRHOIDS WITHOUT MENTION COMP  . ELEVATED BLOOD PRESSURE  . Polyarthritis  . Seasonal allergies  . Folliculitis    Patient's Medications  New Prescriptions   No medications on file  Previous Medications   ACETAMINOPHEN (TYLENOL) 500 MG TABLET    Take 2 tablets (1,000 mg total) by mouth every 6 (six) hours as needed for pain.   ATORVASTATIN (LIPITOR) 40 MG TABLET    Take 1 tablet (40 mg total) by mouth daily.   BLOOD GLUCOSE MONITORING SUPPL (WAVESENSE PRESTO PRO METER) DEVI    by Does not apply route. 1 meter     FISH OIL-OMEGA-3 FATTY ACIDS 1000 MG CAPSULE    Take 1 g by mouth 3 (three) times daily with meals.     FLUTICASONE (FLONASE) 50 MCG/ACT NASAL SPRAY    Place 2 sprays into the nose daily.   GLIPIZIDE (GLUCOTROL) 10 MG TABLET    Take 1 tablet (10 mg total) by mouth daily.   GLUCOSE BLOOD (WAVESENSE PRESTO TEST) TEST STRIP    Use as directed   ISENTRESS 400 MG TABLET    TAKE 1 TABLET BY MOUTH TWICE DAILY   METFORMIN (GLUCOPHAGE) 500 MG TABLET    Take 1 tablet (500 mg total) by mouth daily with breakfast.   NAPROXEN (NAPROSYN) 500 MG TABLET    Take 1 tablet (500 mg total) by mouth 2 (two) times daily with a meal.   TRIAMCINOLONE CREAM (KENALOG) 0.5 %    Apply topically 3 (three) times daily.   TRUVADA 200-300 MG PER TABLET    TAKE 1 TABLET BY MOUTH DAILY  Modified Medications   No medications on file  Discontinued Medications   No medications on file    Subjective: Benjamin Hunter is in for his routine visit. Overall he is feeling better. He has had no further problems with rash and his joint pain and stiffness has improved. He has been checking his blood pressure to 3 times per week. It is usually in the 100-120 range. He has cut back on his metformin and finds  that his diarrhea has resolved.  Objective: Temp: 97.8 F (36.6 C) (01/07 0843) Temp src: Oral (01/07 0843) BP: 137/92 mmHg (01/07 0843) Pulse Rate: 81  (01/07 0843)  General: He is in good spirits. His weight has increased 15 pounds over the past 6 months to 212. His BMI has increased to 28. His last hemoglobin A1c in September was 5.2. Skin: No rash Lungs: Clear Cor: Regular S1 and S2 with no murmurs Abdomen: Soft, obese and nontender He has evidence of lipodystrophy with central adiposity and fat accumulation in the axilla bilaterally  Lab Results HIV 1 RNA Quant (copies/mL)  Date Value  05/15/2012 <20   02/01/2012 <20   09/20/2011 <20      CD4 T Cell Abs (cmm)  Date Value  05/15/2012 700   02/01/2012 590   09/20/2011 610      Assessment: His HIV infection remains under excellent control. I talked to him about lifestyle modification and weight control. His polyarthralgias have improved spontaneously.  Plan: 1. Continue current medications 2. Have given him written information about the [facility] legal 8 Society encouraged him to make an appointment to review his options on  the insurance exchange 3. Followup after blood work in 3 months   [name], MD [facility] for Infectious Disease [facility] [phone] pager   [phone] cell 06/05/2012, 9:03 AM

## 2012-06-13 ENCOUNTER — Other Ambulatory Visit: Payer: Self-pay | Admitting: *Deleted

## 2012-06-13 DIAGNOSIS — J302 Other seasonal allergic rhinitis: Secondary | ICD-10-CM

## 2012-06-13 MED ORDER — FLUTICASONE PROPIONATE 50 MCG/ACT NA SUSP: 2.0000 | Freq: Every day | NASAL | Status: DC

## 2012-07-26 ENCOUNTER — Other Ambulatory Visit: Payer: Self-pay | Admitting: *Deleted

## 2012-07-26 ENCOUNTER — Ambulatory Visit: Payer: Self-pay

## 2012-07-26 DIAGNOSIS — E7800 Pure hypercholesterolemia, unspecified: Secondary | ICD-10-CM

## 2012-07-26 MED ORDER — ATORVASTATIN CALCIUM 40 MG PO TABS: 40.0000 mg | ORAL_TABLET | Freq: Every day | ORAL | Status: DC

## 2012-07-31 ENCOUNTER — Other Ambulatory Visit: Payer: Self-pay | Admitting: *Deleted

## 2012-07-31 DIAGNOSIS — E7800 Pure hypercholesterolemia, unspecified: Secondary | ICD-10-CM

## 2012-07-31 MED ORDER — ATORVASTATIN CALCIUM 40 MG PO TABS: 40.0000 mg | ORAL_TABLET | Freq: Every day | ORAL | Status: DC

## 2012-08-10 ENCOUNTER — Ambulatory Visit (INDEPENDENT_AMBULATORY_CARE_PROVIDER_SITE_OTHER): Payer: No Typology Code available for payment source | Admitting: Infectious Disease

## 2012-08-10 ENCOUNTER — Encounter: Payer: Self-pay | Admitting: Infectious Disease

## 2012-08-10 VITALS — BP 137/88 | HR 99 | Temp 98.6°F | Ht 71.0 in | Wt 210.0 lb

## 2012-08-10 DIAGNOSIS — B20 Human immunodeficiency virus [HIV] disease: Secondary | ICD-10-CM

## 2012-08-10 DIAGNOSIS — L237 Allergic contact dermatitis due to plants, except food: Secondary | ICD-10-CM | POA: Insufficient documentation

## 2012-08-10 DIAGNOSIS — L255 Unspecified contact dermatitis due to plants, except food: Secondary | ICD-10-CM

## 2012-08-10 DIAGNOSIS — E119 Type 2 diabetes mellitus without complications: Secondary | ICD-10-CM

## 2012-08-10 LAB — COMPLETE METABOLIC PANEL WITH GFR
AST: 20 U/L (ref 0–37)
Albumin: 4 g/dL (ref 3.5–5.2)
Alkaline Phosphatase: 97 U/L (ref 39–117)
BUN: 16 mg/dL (ref 6–23)
Calcium: 9.4 mg/dL (ref 8.4–10.5)
Creat: 1.14 mg/dL (ref 0.50–1.35)
GFR, Est Non African American: 74 mL/min
Glucose, Bld: 85 mg/dL (ref 70–99)

## 2012-08-10 LAB — T-HELPER CELL (CD4) - (RCID CLINIC ONLY)
CD4 % Helper T Cell: 36 % (ref 33–55)
CD4 T Cell Abs: 480 uL (ref 400–2700)

## 2012-08-10 LAB — CBC WITH DIFFERENTIAL/PLATELET
Basophils Absolute: 0 10*3/uL (ref 0.0–0.1)
Eosinophils Relative: 6 % — ABNORMAL HIGH (ref 0–5)
HCT: 39.5 % (ref 39.0–52.0)
Lymphocytes Relative: 38 % (ref 12–46)
Lymphs Abs: 1.2 10*3/uL (ref 0.7–4.0)
MCV: 85.7 fL (ref 78.0–100.0)
Neutro Abs: 1.5 10*3/uL — ABNORMAL LOW (ref 1.7–7.7)
Platelets: 171 10*3/uL (ref 150–400)
RBC: 4.61 MIL/uL (ref 4.22–5.81)
RDW: 13.2 % (ref 11.5–15.5)
WBC: 3.2 10*3/uL — ABNORMAL LOW (ref 4.0–10.5)

## 2012-08-10 MED ORDER — TRIAMCINOLONE ACETONIDE 0.5 % EX CREA: TOPICAL_CREAM | Freq: Three times a day (TID) | CUTANEOUS | Status: DC

## 2012-08-10 MED ORDER — PREDNISONE 20 MG PO TABS: 40.0000 mg | ORAL_TABLET | Freq: Every day | ORAL | Status: DC

## 2012-08-10 NOTE — Patient Instructions (Addendum)
Start taking triamcinolone 0.5% cream and apply to your arms and effected areas up to three times daily  If this does not control your symptoms you can start taking prednisone 20mg  two pills daily for 10 days
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
## 2012-08-11 LAB — HEMOGLOBIN A1C: Mean Plasma Glucose: 114 mg/dL (ref <117)

## 2012-08-13 ENCOUNTER — Other Ambulatory Visit: Payer: Self-pay | Admitting: Licensed Clinical Social Worker

## 2012-08-13 ENCOUNTER — Other Ambulatory Visit (INDEPENDENT_AMBULATORY_CARE_PROVIDER_SITE_OTHER): Payer: No Typology Code available for payment source

## 2012-08-13 DIAGNOSIS — I10 Essential (primary) hypertension: Secondary | ICD-10-CM

## 2012-08-13 DIAGNOSIS — Z113 Encounter for screening for infections with a predominantly sexual mode of transmission: Secondary | ICD-10-CM

## 2012-08-13 DIAGNOSIS — B20 Human immunodeficiency virus [HIV] disease: Secondary | ICD-10-CM

## 2012-08-13 LAB — HIV-1 RNA QUANT-NO REFLEX-BLD: HIV 1 RNA Quant: <20 copies/mL (ref <20)

## 2012-08-14 LAB — MICROALBUMIN / CREATININE URINE RATIO: Microalb Creat Ratio: 65.8 mg/g — ABNORMAL HIGH (ref 0.0–30.0)

## 2012-08-18 ENCOUNTER — Encounter (HOSPITAL_COMMUNITY): Payer: Self-pay | Admitting: Emergency Medicine

## 2012-08-18 ENCOUNTER — Emergency Department (INDEPENDENT_AMBULATORY_CARE_PROVIDER_SITE_OTHER)
Admission: EM | Admit: 2012-08-18 | Discharge: 2012-08-18 | Disposition: A | Discharge status: Nursing Home (Includes ICF) | Payer: No Typology Code available for payment source | Source: Home / Self Care | Attending: Emergency Medicine | Admitting: Emergency Medicine

## 2012-08-18 ENCOUNTER — Emergency Department (HOSPITAL_COMMUNITY)
Admission: EM | Admit: 2012-08-18 | Discharge: 2012-08-18 | Disposition: A | Discharge status: Home / Self Care | Payer: No Typology Code available for payment source | Attending: Emergency Medicine | Admitting: Emergency Medicine

## 2012-08-18 ENCOUNTER — Emergency Department (HOSPITAL_COMMUNITY): Payer: No Typology Code available for payment source

## 2012-08-18 DIAGNOSIS — Z79899 Other long term (current) drug therapy: Secondary | ICD-10-CM | POA: Insufficient documentation

## 2012-08-18 DIAGNOSIS — R42 Dizziness and giddiness: Secondary | ICD-10-CM | POA: Insufficient documentation

## 2012-08-18 DIAGNOSIS — R079 Chest pain, unspecified: Secondary | ICD-10-CM

## 2012-08-18 DIAGNOSIS — I10 Essential (primary) hypertension: Secondary | ICD-10-CM | POA: Insufficient documentation

## 2012-08-18 DIAGNOSIS — E119 Type 2 diabetes mellitus without complications: Secondary | ICD-10-CM

## 2012-08-18 DIAGNOSIS — R0789 Other chest pain: Secondary | ICD-10-CM | POA: Insufficient documentation

## 2012-08-18 DIAGNOSIS — IMO0002 Reserved for concepts with insufficient information to code with codable children: Secondary | ICD-10-CM | POA: Insufficient documentation

## 2012-08-18 DIAGNOSIS — E785 Hyperlipidemia, unspecified: Secondary | ICD-10-CM

## 2012-08-18 DIAGNOSIS — R209 Unspecified disturbances of skin sensation: Secondary | ICD-10-CM

## 2012-08-18 DIAGNOSIS — R0602 Shortness of breath: Secondary | ICD-10-CM | POA: Insufficient documentation

## 2012-08-18 DIAGNOSIS — Z21 Asymptomatic human immunodeficiency virus [HIV] infection status: Secondary | ICD-10-CM | POA: Insufficient documentation

## 2012-08-18 DIAGNOSIS — F411 Generalized anxiety disorder: Secondary | ICD-10-CM | POA: Insufficient documentation

## 2012-08-18 LAB — GLUCOSE, CAPILLARY: Glucose-Capillary: 197 mg/dL — ABNORMAL HIGH (ref 70–99)

## 2012-08-18 LAB — CBC
Hemoglobin: 12.9 g/dL — ABNORMAL LOW (ref 13.0–17.0)
MCH: 30.4 pg (ref 26.0–34.0)
MCHC: 34.5 g/dL (ref 30.0–36.0)
Platelets: 184 10*3/uL (ref 150–400)
RBC: 4.24 MIL/uL (ref 4.22–5.81)

## 2012-08-18 LAB — BASIC METABOLIC PANEL
CO2: 24 mEq/L (ref 19–32)
Calcium: 9.1 mg/dL (ref 8.4–10.5)
GFR calc non Af Amer: 77 mL/min — ABNORMAL LOW (ref >90)
Glucose, Bld: 168 mg/dL — ABNORMAL HIGH (ref 70–99)
Potassium: 3.7 mEq/L (ref 3.5–5.1)
Sodium: 136 mEq/L (ref 135–145)

## 2012-08-18 LAB — POCT I-STAT TROPONIN I
Troponin i, poc: 0.03 ng/mL (ref 0.00–0.08)
Troponin i, poc: 0.02 ng/mL (ref 0.00–0.08)

## 2012-08-18 MED ORDER — ASPIRIN 81 MG PO CHEW
CHEWABLE_TABLET | ORAL | Status: AC
  Filled 2012-08-18: qty 4

## 2012-08-18 MED ORDER — ASPIRIN 81 MG PO CHEW
324.0000 mg | CHEWABLE_TABLET | Freq: Once | ORAL | Status: AC
  Administered 2012-08-18: 324 mg via ORAL

## 2012-08-18 MED ORDER — SODIUM CHLORIDE 0.9 % IV SOLN
Freq: Once | INTRAVENOUS | Status: AC
  Administered 2012-08-18: 13:00:00 via INTRAVENOUS

## 2012-08-18 NOTE — ED Notes (Signed)
Assisted pt going to the bathroom

## 2012-08-18 NOTE — ED Notes (Signed)
Patient discharged with instructions using the teach back method, patient verbalizes an understanding.

## 2012-08-18 NOTE — ED Provider Notes (Signed)
History     CSN: [id number]  Arrival date & time 08/18/12  1350   First MD Initiated Contact with Patient 08/18/12 1351      Chief Complaint  Patient presents with  . Hypertension    (Consider location/radiation/quality/duration/timing/severity/associated sxs/prior treatment) HPI Comments: Patient with history of HIV well-controlled on HAART therapy (last CD4 count 480, viral load undetectable), diabetes, hypertension -- presents from urgent care after having a five-minute episode of headedness, left chest tightness, left jaw numbness, and shortness of breath this morning. The symptoms have completely resolved. He denies fevers or cold symptoms, nausea, vomiting, diarrhea. Patient states that he had a similar episode yesterday which lasted for about 20 minutes and resolved spontaneously. Symptoms occurred at rest. Aspirin given at urgent care. No family history of CAD. Onset of symptoms acute. Course is resolved. Nothing makes symptoms better or worse.  Patient is a 52 y.o. male presenting with hypertension. The history is provided by the patient.  Hypertension Associated symptoms include chest pain and numbness. Pertinent negatives include no abdominal pain, coughing, diaphoresis, fever, nausea, neck pain, rash or vomiting.    Past Medical History  Diagnosis Date  . Diabetes mellitus   . HIV (human immunodeficiency virus infection)     History reviewed. No pertinent past surgical history.  History reviewed. No pertinent family history.  History  Substance Use Topics  . Smoking status: Never Smoker   . Smokeless tobacco: Never Used  . Alcohol Use: 0.5 oz/week    1 drink(s) per week     Comment: 1 can beer a week      Review of Systems  Constitutional: Negative for fever and diaphoresis.  HENT: Negative for neck pain.   Eyes: Negative for redness.  Respiratory: Positive for shortness of breath. Negative for cough.   Cardiovascular: Positive for chest pain. Negative for  palpitations and leg swelling.  Gastrointestinal: Negative for nausea, vomiting and abdominal pain.  Genitourinary: Negative for dysuria.  Musculoskeletal: Negative for back pain.  Skin: Negative for rash.  Neurological: Positive for light-headedness and numbness. Negative for syncope.    Allergies  Review of patient's allergies indicates no known allergies.  Home Medications   Current Outpatient Rx  Name  Route  Sig  Dispense  Refill  . acetaminophen (TYLENOL) 500 MG tablet   Oral   Take 2 tablets (1,000 mg total) by mouth every 6 (six) hours as needed for pain.   100 tablet   2   . atorvastatin (LIPITOR) 40 MG tablet   Oral   Take 1 tablet (40 mg total) by mouth daily.   90 tablet   3   . Blood Glucose Monitoring Suppl (WAVESENSE PRESTO PRO METER) DEVI   Does not apply   by Does not apply route. 1 meter           . fish oil-omega-3 fatty acids 1000 MG capsule   Oral   Take 1 g by mouth 3 (three) times daily with meals.           . fluticasone (FLONASE) 50 MCG/ACT nasal spray   Nasal   Place 2 sprays into the nose daily.   16 g   6   . glipiZIDE (GLUCOTROL) 10 MG tablet   Oral   Take 1 tablet (10 mg total) by mouth daily.   30 tablet   11   . glucose blood (WAVESENSE PRESTO TEST) test strip      Use as directed   100 each  11   . ISENTRESS 400 MG tablet      TAKE 1 TABLET BY MOUTH TWICE DAILY   60 tablet   5   . naproxen (NAPROSYN) 500 MG tablet   Oral   Take 1 tablet (500 mg total) by mouth 2 (two) times daily with a meal.   60 tablet   2   . predniSONE (DELTASONE) 20 MG tablet   Oral   Take 2 tablets (40 mg total) by mouth daily.   20 tablet   0   . triamcinolone cream (KENALOG) 0.5 %   Topical   Apply topically 3 (three) times daily.   30 g   3   . TRUVADA 200-300 MG per tablet      TAKE 1 TABLET BY MOUTH DAILY   30 tablet   5     BP 147/93  Pulse 97  Temp(Src) 98 F (36.7 C) (Oral)  Resp 18  SpO2 100%  Physical  Exam  Nursing note and vitals reviewed. Constitutional: He appears well-developed and well-nourished.  HENT:  Head: Normocephalic and atraumatic.  Mouth/Throat: Mucous membranes are normal. Mucous membranes are not dry.  Eyes: Conjunctivae are normal.  Neck: Trachea normal and normal range of motion. Neck supple. Normal carotid pulses and no JVD present. No muscular tenderness present. Carotid bruit is not present. No tracheal deviation present.  Cardiovascular: Normal rate, regular rhythm, S1 normal, S2 normal, normal heart sounds and intact distal pulses.  Exam reveals no distant heart sounds and no decreased pulses.   No murmur heard. Pulmonary/Chest: Effort normal and breath sounds normal. No respiratory distress. He has no wheezes. He exhibits no tenderness.  Abdominal: Soft. Normal aorta and bowel sounds are normal. There is no tenderness. There is no rebound and no guarding.  Musculoskeletal: He exhibits no edema.  Neurological: He is alert.  Skin: Skin is warm and dry. He is not diaphoretic. No cyanosis. No pallor.  Psychiatric: He has a normal mood and affect.    ED Course  Procedures (including critical care time)  Labs Reviewed  CBC - Abnormal; Notable for the following:    Hemoglobin 12.9 (*)    HCT 37.4 (*)    All other components within normal limits  BASIC METABOLIC PANEL  POCT I-STAT TROPONIN I   No results found.   1. Chest pain     2:05 PM Patient seen and examined. Work-up initiated. EKG reviewed. ASA given at [facility].   Vital signs reviewed and are as follows: Filed Vitals:   08/18/12 1358  BP: 147/93  Pulse: 97  Temp: 98 F (36.7 C)  Resp: 18    Date: 08/18/2012  Rate: 96  Rhythm: normal sinus rhythm  QRS Axis: normal  Intervals: normal  ST/T Wave abnormalities: normal  Conduction Disutrbances:first-degree A-V block   Narrative Interpretation:   Old EKG Reviewed: unchanged from EKG today at 12:24pm, otherwise no previous available.   3:33 PM  First troponin neg.   Handoff to Dr. Oletta Lamas.   Plan: 2nd marker at 1800. If neg, d/c home. Patient does not want to stay in hospital and will not accept admission. Will have patient call [facility] for outpatient stress test.   If going home: patient was counseled to return with severe chest pain, especially if the pain is crushing or pressure-like and spreads to the arms, back, neck, or jaw, or if they have sweating, nausea, or shortness of breath with the pain. They were encouraged to call 911 with  these symptoms.   They were also told to return if their chest pain gets worse and does not go away with rest, they have an attack of chest pain lasting longer than usual despite rest and treatment with the medications their caregiver has prescribed, if they wake from sleep with chest pain or shortness of breath, if they feel dizzy or faint, if they have chest pain not typical of their usual pain, or if they have any other emergent concerns regarding their health.  The patient verbalized understanding and agreed.    MDM  Awaiting completion of cardiac work-up.         Renne Crigler, PA-C 08/18/12 1538

## 2012-08-18 NOTE — ED Notes (Signed)
Patient brought in via [facility] he  was seen at Urgent Care earlier today for an anxiety attack and HTN. Per the patient he has anxiety and has been a little stressed out this week. He states he did not know that he had HTN, he is her for further evaluation. IV 18 g in the left [facility]

## 2012-08-18 NOTE — ED Notes (Signed)
Patient transported to X-ray 

## 2012-08-18 NOTE — ED Notes (Signed)
Pt c/o SOB onset 30 minutes prior to checking in Reports while driving, he felt numbness on left side of face; didn't pay much attention to it Then he started to feel pressure on his chest and dyspnea, dizziness and blurry vision Denies: pain at the moment, headache, edema Also states he noticed frequent urination last night  He is alert and oriented w/no signs of acute distress.

## 2012-08-18 NOTE — ED Notes (Signed)
Report given to [facility]  

## 2012-08-18 NOTE — Discharge Instructions (Signed)
Chest Pain (Nonspecific) It is often hard to give a specific diagnosis for the cause of chest pain. There is always a chance that your pain could be related to something serious, such as a heart attack or a blood clot in the lungs. You need to follow up with your caregiver for further evaluation. CAUSES   Heartburn.  Pneumonia or bronchitis.  Anxiety or stress.  Inflammation around your heart (pericarditis) or lung (pleuritis or pleurisy).  A blood clot in the lung.  A collapsed lung (pneumothorax). It can develop suddenly on its own (spontaneous pneumothorax) or from injury (trauma) to the chest.  Shingles infection (herpes zoster virus). The chest wall is composed of bones, muscles, and cartilage. Any of these can be the source of the pain.  The bones can be bruised by injury.  The muscles or cartilage can be strained by coughing or overwork.  The cartilage can be affected by inflammation and become sore (costochondritis). DIAGNOSIS  Lab tests or other studies, such as X-rays, electrocardiography, stress testing, or cardiac imaging, may be needed to find the cause of your pain.  TREATMENT   Treatment depends on what may be causing your chest pain. Treatment may include:  Acid blockers for heartburn.  Anti-inflammatory medicine.  Pain medicine for inflammatory conditions.  Antibiotics if an infection is present.  You may be advised to change lifestyle habits. This includes stopping smoking and avoiding alcohol, caffeine, and chocolate.  You may be advised to keep your head raised (elevated) when sleeping. This reduces the chance of acid going backward from your stomach into your esophagus.  Most of the time, nonspecific chest pain will improve within 2 to 3 days with rest and mild pain medicine. HOME CARE INSTRUCTIONS   If antibiotics were prescribed, take your antibiotics as directed. Finish them even if you start to feel better.  For the next few days, avoid physical  activities that bring on chest pain. Continue physical activities as directed.  Do not smoke.  Avoid drinking alcohol.  Only take over-the-counter or prescription medicine for pain, discomfort, or fever as directed by your caregiver.  Follow your caregiver's suggestions for further testing if your chest pain does not go away.  Keep any follow-up appointments you made. If you do not go to an appointment, you could develop lasting (chronic) problems with pain. If there is any problem keeping an appointment, you must call to reschedule. SEEK MEDICAL CARE IF:   You think you are having problems from the medicine you are taking. Read your medicine instructions carefully.  Your chest pain does not go away, even after treatment.  You develop a rash with blisters on your chest. SEEK IMMEDIATE MEDICAL CARE IF:   You have increased chest pain or pain that spreads to your arm, neck, jaw, back, or abdomen.  You develop shortness of breath, an increasing cough, or you are coughing up blood.  You have severe back or abdominal pain, feel nauseous, or vomit.  You develop severe weakness, fainting, or chills.  You have a fever. THIS IS AN EMERGENCY. Do not wait to see if the pain will go away. Get medical help at once. Call your local emergency services (911 in [place] Do not drive yourself to the hospital. MAKE SURE YOU:   Understand these instructions.  Will watch your condition.  Will get help right away if you are not doing well or get worse. Document Released: 02/23/2005 Document Revised: 08/08/2011 Document Reviewed: 12/20/2007 Exit[employer] Patient Information 2013 [employer].  

## 2012-08-18 NOTE — ED Provider Notes (Addendum)
History     CSN: [id number]  Arrival date & time 08/18/12  1218   First MD Initiated Contact with Patient 08/18/12 1228      Chief Complaint  Patient presents with  . Shortness of Breath    (Consider location/radiation/quality/duration/timing/severity/associated sxs/prior treatment) HPI Comments: Patient presents urgent care describing tightness to his chest that occur about 30-40 minutes prior to his arrival to urgent care. Patient described felt a tightness sensation on the anterior aspect of his chest. Patient goes into describing that yesterday he felt some numbness sensation on his left jaw and neck area associated with dizziness and feeling like he was found passed out at that point he did not feel any chest pains but did feel some degree of shortness of breath. Patient denies any cough fevers or further neurological symptoms such as headaches, numbness or weakness of any upper or lower extremities.  Are you having any chest pain or tightness at this point? No "I. don't I feel pretty good now".  Patient is a 52 y.o. male presenting with shortness of breath. The history is provided by the patient.  Shortness of Breath Severity:  Moderate Onset quality:  Sudden Timing:  Intermittent Progression:  Waxing and waning Relieved by:  Nothing Worsened by:  Nothing tried Ineffective treatments:  None tried Associated symptoms: chest pain   Associated symptoms: no cough, no diaphoresis, no fever, no neck pain, no rash and no wheezing     Past Medical History  Diagnosis Date  . Diabetes mellitus   . HIV (human immunodeficiency virus infection)     History reviewed. No pertinent past surgical history.  No family history on file.  History  Substance Use Topics  . Smoking status: Never Smoker   . Smokeless tobacco: Never Used  . Alcohol Use: 0.5 oz/week    1 drink(s) per week     Comment: 1 can beer a week      Review of Systems  Constitutional: Negative for fever,  chills, diaphoresis, activity change, appetite change and fatigue.  HENT: Negative for neck pain.   Respiratory: Positive for chest tightness and shortness of breath. Negative for cough, wheezing and stridor.   Cardiovascular: Positive for chest pain. Negative for palpitations and leg swelling.  Gastrointestinal: Negative for diarrhea and constipation.  Skin: Negative for rash and wound.  Neurological: Positive for dizziness and light-headedness. Negative for tremors, seizures, syncope, speech difficulty and numbness.    Allergies  Review of patient's allergies indicates no known allergies.  Home Medications   Current Outpatient Rx  Name  Route  Sig  Dispense  Refill  . atorvastatin (LIPITOR) 40 MG tablet   Oral   Take 1 tablet (40 mg total) by mouth daily.   90 tablet   3   . acetaminophen (TYLENOL) 500 MG tablet   Oral   Take 2 tablets (1,000 mg total) by mouth every 6 (six) hours as needed for pain.   100 tablet   2   . Blood Glucose Monitoring Suppl (WAVESENSE PRESTO PRO METER) DEVI   Does not apply   by Does not apply route. 1 meter           . fish oil-omega-3 fatty acids 1000 MG capsule   Oral   Take 1 g by mouth 3 (three) times daily with meals.           . fluticasone (FLONASE) 50 MCG/ACT nasal spray   Nasal   Place 2 sprays into the nose daily.  16 g   6   . glipiZIDE (GLUCOTROL) 10 MG tablet   Oral   Take 1 tablet (10 mg total) by mouth daily.   30 tablet   11   . glucose blood (WAVESENSE PRESTO TEST) test strip      Use as directed   100 each   11   . ISENTRESS 400 MG tablet      TAKE 1 TABLET BY MOUTH TWICE DAILY   60 tablet   5   . naproxen (NAPROSYN) 500 MG tablet   Oral   Take 1 tablet (500 mg total) by mouth 2 (two) times daily with a meal.   60 tablet   2   . predniSONE (DELTASONE) 20 MG tablet   Oral   Take 2 tablets (40 mg total) by mouth daily.   20 tablet   0   . triamcinolone cream (KENALOG) 0.5 %   Topical   Apply  topically 3 (three) times daily.   30 g   3   . TRUVADA 200-300 MG per tablet      TAKE 1 TABLET BY MOUTH DAILY   30 tablet   5     BP 168/107  Pulse 94  Temp(Src) 98 F (36.7 C) (Oral)  Resp 20  SpO2 98%  Physical Exam  Nursing note reviewed. Constitutional: He is oriented to person, place, and time. Vital signs are normal. He appears well-developed and well-nourished.  Non-toxic appearance. He does not have a sickly appearance. He does not appear ill. No distress.  Eyes: Conjunctivae are normal. Pupils are equal, round, and reactive to light.  Neck: Neck supple. No JVD present.  Cardiovascular: Normal rate and regular rhythm.  Exam reveals friction rub. Exam reveals no gallop.   No murmur heard. Pulmonary/Chest: Effort normal and breath sounds normal.  Abdominal: Soft.  Lymphadenopathy:    He has no cervical adenopathy.  Neurological: He is alert and oriented to person, place, and time. He has normal strength. No cranial nerve deficit or sensory deficit. GCS eye subscore is 4. GCS motor subscore is 6.  Skin: No rash noted. No erythema.    ED Course  Procedures (including critical care time)  Labs Reviewed  GLUCOSE, CAPILLARY - Abnormal; Notable for the following:    Glucose-Capillary 197 (*)    All other components within normal limits   No results found.   1. Chest pain at rest   2. Dizziness     EKG obtained at 12 hours. Ventricular rate of 91 beats per minute normal sinus rhythm + left ventricular hypertrophy isolated T wave inversion in lead 3 no continues ST elevation isolated abnormality observed in lead 1 most consistent with early repolarization. No PR QRS or QT interval are duration abnormalities. Nondiagnostic( informational only)  bedside ultrasound performed- no obvious signs of a larger moderate pericardial effusion. MDM   52 year old male presents urgent care complaining of intermittent anterior chest pain, shortness of breath and presyncopal  symptomatology. Half normal EKG at [facility] but not consistent or suggestive of  acute ischemia- at time of evaluation patient is noted to be mildly hypertensive comfortable and asymptomatic. Patient will be transferred stable to the emergency department for further evaluation as I cannot fully rule out unstable angina or an impending coronary event. Patient had been expressing intermittent symptoms for 2 days.      Jimmie Molly, MD 08/18/12 1302  Jimmie Molly, MD 08/18/12 [phone]

## 2012-08-19 NOTE — ED Provider Notes (Signed)
Medical screening examination/treatment/procedure(s) were performed by non-physician practitioner and as supervising physician I was immediately available for consultation/collaboration.   Mamta Rimmer, MD 08/19/12 0710 

## 2012-08-20 ENCOUNTER — Encounter: Payer: Self-pay | Admitting: Internal Medicine

## 2012-08-20 ENCOUNTER — Ambulatory Visit (INDEPENDENT_AMBULATORY_CARE_PROVIDER_SITE_OTHER): Payer: No Typology Code available for payment source | Admitting: Internal Medicine

## 2012-08-20 VITALS — BP 141/92 | HR 96 | Temp 98.3°F | Ht 71.0 in | Wt 200.5 lb

## 2012-08-20 DIAGNOSIS — R200 Anesthesia of skin: Secondary | ICD-10-CM | POA: Insufficient documentation

## 2012-08-20 DIAGNOSIS — E785 Hyperlipidemia, unspecified: Secondary | ICD-10-CM

## 2012-08-20 DIAGNOSIS — E119 Type 2 diabetes mellitus without complications: Secondary | ICD-10-CM

## 2012-08-20 DIAGNOSIS — R209 Unspecified disturbances of skin sensation: Secondary | ICD-10-CM

## 2012-08-20 LAB — LIPID PANEL
LDL Cholesterol: 83 mg/dL (ref 0–99)
VLDL: 30 mg/dL (ref 0–40)

## 2012-08-20 LAB — HEMOGLOBIN A1C
Hgb A1c MFr Bld: 5.9 % — ABNORMAL HIGH (ref <5.7)
Mean Plasma Glucose: 123 mg/dL — ABNORMAL HIGH (ref <117)

## 2012-08-20 NOTE — Progress Notes (Signed)
Patient ID: Benjamin Hunter, male   DOB: 1960-12-26, 52 y.o.   MRN: [id number]          [facility] for Infectious Disease  Patient Active Problem List  Diagnosis  . HIV DISEASE  . DM  . HYPERLIPIDEMIA  . EXTERNAL HEMORRHOIDS WITHOUT MENTION COMP  . ELEVATED BLOOD PRESSURE  . Polyarthritis  . Seasonal allergies  . Folliculitis  . Poison ivy    Patient's Medications  New Prescriptions   No medications on file  Previous Medications   ATORVASTATIN (LIPITOR) 40 MG TABLET    Take 40 mg by mouth daily.   BLOOD GLUCOSE MONITORING SUPPL (WAVESENSE PRESTO PRO METER) DEVI    by Does not apply route. 1 meter     EMTRICITABINE-TENOFOVIR (TRUVADA) 200-300 MG PER TABLET    Take 1 tablet by mouth daily.   FLUTICASONE (FLONASE) 50 MCG/ACT NASAL SPRAY    Place 2 sprays into the nose daily as needed for rhinitis or allergies.   GLIPIZIDE (GLUCOTROL) 10 MG TABLET    Take 10 mg by mouth daily.   GLUCOSE BLOOD (WAVESENSE PRESTO TEST) TEST STRIP    Use as directed   NAPROXEN (NAPROSYN) 500 MG TABLET    Take 500 mg by mouth 2 (two) times daily with a meal.   RALTEGRAVIR (ISENTRESS) 400 MG TABLET    Take 400 mg by mouth 2 (two) times daily.   TRIAMCINOLONE CREAM (KENALOG) 0.5 %    Apply 1 application topically 3 (three) times daily.  Modified Medications   No medications on file  Discontinued Medications   No medications on file    Subjective: Charlee is in for an emergency department followup. He was seen by my partner, Dr. Daiva Eves, last week and treated for poison ivy with prednisone and topical triamcinolone cream. I problem improved promptly. 2 days ago he had a transient episode of left facial numbness and shortness of breath while driving his car. He has similar episode about 8 years ago that lasted about 20 minutes and resolved spontaneously. He became concerned about the episode 2 days ago and drove himself to the emergency department. By the time he got there the numbness and  shortness of breath had resolved. He was concerned about the possibility of a stroke or heart attack. He was evaluated weight bear with an EKG, chest x-ray and cardiac enzymes all of which were normal. He did not know any facial asymmetry, weakness or difficulty with his speech. He has felt well since his emergency department visit. He has not missed any doses of his Truvada or Isentress. He has been off of his metformin for several months because of GI intolerance.  Objective: Temp: 98.3 F (36.8 C) (03/24 1449) Temp src: Oral (03/24 1449) BP: 141/92 mmHg (03/24 1449) Pulse Rate: 96 (03/24 1449)  General: he is in good spirits and in no distress. His weight is down to 200 pounds from 213. Skin: his poison ivy rash has resolved. He has some superficial desquamation on his right forearm. Lungs: clear Cor: regular S1 and S2 and no murmurs Abdomen: soft and nontender Neuro: His cranial nerves are intact. His speech is normal. He has normal strength in all extremities.  Lab Results HIV 1 RNA Quant (copies/mL)  Date Value  08/10/2012 <20   05/15/2012 <20   02/01/2012 <20      CD4 T Cell Abs (cmm)  Date Value  08/10/2012 480   05/15/2012 700   02/01/2012 590  BMET    Component Value Date/Time   NA 136 08/18/2012 1502   K 3.7 08/18/2012 1502   CL 101 08/18/2012 1502   CO2 24 08/18/2012 1502   GLUCOSE 168* 08/18/2012 1502   BUN 17 08/18/2012 1502   CREATININE 1.08 08/18/2012 1502   CREATININE 1.14 08/10/2012 1037   CALCIUM 9.1 08/18/2012 1502   GFRNONAA 77* 08/18/2012 1502   GFRAA 89* 08/18/2012 1502   Lab Results  Component Value Date   CHOL 158 02/01/2012   HDL 30* 02/01/2012   LDLCALC 105* 02/01/2012   TRIG 113 02/01/2012   CHOLHDL 5.3 02/01/2012     Assessment: I do not know what caused his recent episode of shortness of breath and left facial numbness but I do not see any evidence for a stroke or other neurologic event, acute coronary syndrome or lung problems. I will not do any further  diagnostic evaluation at this time.  His HIV infection remains under excellent control. I will continue his current antiretroviral regimen.  He has some microalbuminuria, persistent hyperglycemia and mild dyslipidemia. I will check a hemoglobin A1c today and have him start a baby aspirin daily. I will see him back within the next month to consider also starting an ACE or ARB inhibitor.  Plan: 1. Continue current antiretroviral therapy 2. Check hemoglobin A1c and lipid panel today 3. Start baby aspirin daily 4. Followup in one month   [name], MD [facility] for Infectious Disease [facility] [phone] pager   [phone] cell 08/20/2012, 4:05 PM

## 2012-08-27 ENCOUNTER — Other Ambulatory Visit: Payer: Self-pay

## 2012-08-27 DIAGNOSIS — E119 Type 2 diabetes mellitus without complications: Secondary | ICD-10-CM

## 2012-08-27 MED ORDER — GLUCOSE BLOOD VI STRP: ORAL_STRIP | Status: DC

## 2012-08-29 ENCOUNTER — Other Ambulatory Visit: Payer: Self-pay | Admitting: Internal Medicine

## 2012-08-29 DIAGNOSIS — B20 Human immunodeficiency virus [HIV] disease: Secondary | ICD-10-CM

## 2012-09-04 ENCOUNTER — Other Ambulatory Visit: Payer: Self-pay

## 2012-09-13 ENCOUNTER — Other Ambulatory Visit: Payer: Self-pay | Admitting: *Deleted

## 2012-09-13 DIAGNOSIS — E119 Type 2 diabetes mellitus without complications: Secondary | ICD-10-CM

## 2012-09-13 MED ORDER — GLIPIZIDE 10 MG PO TABS: 10.0000 mg | ORAL_TABLET | Freq: Every day | ORAL | Status: DC

## 2012-09-13 NOTE — Telephone Encounter (Signed)
Patient called and advised he needs a refill of glipizide. He also asked about his metformin but I advised him because the doctor had taken it off his med list I can not fill it unless I ask the doctor first. He advised he has an appt with doctor 09/20/12 and will just ask him at that time. Advised him I am making a note of this and we will see him at his appt time.

## 2012-09-18 ENCOUNTER — Ambulatory Visit: Payer: Self-pay | Admitting: Internal Medicine

## 2012-09-20 ENCOUNTER — Ambulatory Visit: Payer: No Typology Code available for payment source | Admitting: Internal Medicine

## 2012-09-20 ENCOUNTER — Ambulatory Visit (INDEPENDENT_AMBULATORY_CARE_PROVIDER_SITE_OTHER): Payer: No Typology Code available for payment source | Admitting: Internal Medicine

## 2012-09-20 VITALS — BP 149/96 | HR 72 | Temp 98.3°F | Ht 71.0 in | Wt 206.8 lb

## 2012-09-20 DIAGNOSIS — E119 Type 2 diabetes mellitus without complications: Secondary | ICD-10-CM

## 2012-09-20 MED ORDER — METFORMIN HCL 500 MG PO TABS: 500.0000 mg | ORAL_TABLET | Freq: Every day | ORAL | Status: DC

## 2012-09-20 MED ORDER — ASPIRIN 81 MG PO TBEC: 81.0000 mg | DELAYED_RELEASE_TABLET | Freq: Every day | ORAL | Status: DC

## 2012-09-20 MED ORDER — LISINOPRIL 10 MG PO TABS: 10.0000 mg | ORAL_TABLET | Freq: Every day | ORAL | Status: DC

## 2012-09-20 NOTE — Progress Notes (Signed)
Patient ID: Benjamin Hunter, male   DOB: Jul 10, 1960, 52 y.o.   MRN: [id number]          [facility] for Infectious Disease  Patient Active Problem List  Diagnosis  . HIV DISEASE  . DM  . HYPERLIPIDEMIA  . ELEVATED BLOOD PRESSURE  . Polyarthritis  . Seasonal allergies  . Left facial numbness    Patient's Medications  New Prescriptions   ASPIRIN 81 MG EC TABLET    Take 1 tablet (81 mg total) by mouth daily. Swallow whole.   LISINOPRIL (PRINIVIL,ZESTRIL) 10 MG TABLET    Take 1 tablet (10 mg total) by mouth daily.  Previous Medications   ATORVASTATIN (LIPITOR) 40 MG TABLET    Take 40 mg by mouth daily.   BLOOD GLUCOSE MONITORING SUPPL (WAVESENSE PRESTO PRO METER) DEVI    by Does not apply route. 1 meter     FLUTICASONE (FLONASE) 50 MCG/ACT NASAL SPRAY    Place 2 sprays into the nose daily as needed for rhinitis or allergies.   GLIPIZIDE (GLUCOTROL) 10 MG TABLET    Take 1 tablet (10 mg total) by mouth daily.   GLUCOSE BLOOD (WAVESENSE PRESTO TEST) TEST STRIP    Use as directed   ISENTRESS 400 MG TABLET    TAKE 1 TABLET BY MOUTH TWICE DAILY   TRIAMCINOLONE CREAM (KENALOG) 0.5 %    Apply 1 application topically 3 (three) times daily.   TRUVADA 200-300 MG PER TABLET    TAKE 1 TABLET BY MOUTH EVERY DAY  Modified Medications   Modified Medication Previous Medication   METFORMIN (GLUCOPHAGE) 500 MG TABLET metFORMIN (GLUCOPHAGE) 500 MG tablet      Take 1 tablet (500 mg total) by mouth daily.    Take 500 mg by mouth daily.  Discontinued Medications   No medications on file    Subjective: Benjamin Hunter is in for his routine visit. He has not missed any doses of his Truvada or Isentress. He states that some mornings his blood pressure is 90-100 and he does not take his metformin because he finds that he drops his blood pressure down too low. He will take 500 mg daily and his blood sugar is 150 or greater. He has not started his baby aspirin yet. He is still having his chronic low back pain  and stiffness and pain in his left hand and wrist. He's had no further episodes of left facial numbness.  Objective: Temp: 98.3 F (36.8 C) (04/24 0937) Temp src: Oral (04/24 0937) BP: 149/96 mmHg (04/24 0937) Pulse Rate: 72 (04/24 0937)  General: He is in good spirits Skin: No rash Lungs: Clear Cor: Regular S1 and S2 no murmurs Abdomen: Nontender Joints and extremities: He still has some diffuse swelling over the left hand MCP joints  Lab Results HIV 1 RNA Quant (copies/mL)  Date Value  08/10/2012 <20   05/15/2012 <20   02/01/2012 <20      CD4 T Cell Abs (cmm)  Date Value  08/10/2012 480   05/15/2012 700   02/01/2012 590    BMET    Component Value Date/Time   NA 136 08/18/2012 1502   K 3.7 08/18/2012 1502   CL 101 08/18/2012 1502   CO2 24 08/18/2012 1502   GLUCOSE 168* 08/18/2012 1502   BUN 17 08/18/2012 1502   CREATININE 1.08 08/18/2012 1502   CREATININE 1.14 08/10/2012 1037   CALCIUM 9.1 08/18/2012 1502   GFRNONAA 77* 08/18/2012 1502   GFRAA 89* 08/18/2012 1502  Lab Results  Component Value Date   HGBA1C 5.9* 08/20/2012   Lab Results  Component Value Date   WBC 6.5 08/18/2012   HGB 12.9* 08/18/2012   HCT 37.4* 08/18/2012   MCV 88.2 08/18/2012   PLT 184 08/18/2012   Lab Results  Component Value Date   ALT 21 08/10/2012   AST 20 08/10/2012   ALKPHOS 97 08/10/2012   BILITOT 0.5 08/10/2012   Lab Results  Component Value Date   CHOL 150 08/20/2012   HDL 37* 08/20/2012   LDLCALC 83 08/20/2012   TRIG 152* 08/20/2012   CHOLHDL 4.1 08/20/2012     Assessment: His HIV infection remains under excellent control. I will continue his current regimen.  His hemoglobin A1c is slightly up at 5.9. Suggested he try taking half of his metformin tablet daily and I will have him start a baby aspirin daily. His blood pressure has been borderline elevated and he has elevated microalbumin in his urine. I will have him start an ACE inhibitor today.  He's had marked improvement in his lipids  over the last 2 years.  He has smoldering polyarthritis of unknown cause. It is a little better over the past year.  Plan: 1. Continue current medications 2. Start a baby aspirin daily 3. Start lisinopril 100 mg daily 4. Followup for lab work in 4 weeks   [name], MD [facility] for Infectious Disease [facility] [phone] pager   [phone] cell 09/20/2012, 10:23 AM

## 2012-10-04 ENCOUNTER — Telehealth: Payer: Self-pay | Admitting: *Deleted

## 2012-10-04 NOTE — Telephone Encounter (Signed)
Please ask him to stop lisinopril and schedule him to see me next Thursday morning, May 15.

## 2012-10-04 NOTE — Telephone Encounter (Signed)
Pt wondering if the peeling is due to a side effect of the new b/p medication.  Pt asking whether he should stop the medication.  MD please advise.

## 2012-10-04 NOTE — Telephone Encounter (Signed)
Adding pt to Dr. Blair Dolphin 10/11/12 schedule for 0930.

## 2012-10-04 NOTE — Addendum Note (Signed)
Addended by: [name] on: 10/04/2012 12:37 PM   Modules accepted: Orders, Medications

## 2012-10-04 NOTE — Telephone Encounter (Signed)
Please ask him to stop

## 2012-10-11 ENCOUNTER — Encounter: Payer: Self-pay | Admitting: Internal Medicine

## 2012-10-11 ENCOUNTER — Ambulatory Visit (INDEPENDENT_AMBULATORY_CARE_PROVIDER_SITE_OTHER): Payer: No Typology Code available for payment source | Admitting: Internal Medicine

## 2012-10-11 VITALS — BP 133/81 | HR 72 | Temp 97.9°F | Ht 71.0 in | Wt 205.0 lb

## 2012-10-11 DIAGNOSIS — B20 Human immunodeficiency virus [HIV] disease: Secondary | ICD-10-CM

## 2012-10-11 DIAGNOSIS — Z23 Encounter for immunization: Secondary | ICD-10-CM

## 2012-10-11 DIAGNOSIS — M545 Low back pain: Secondary | ICD-10-CM | POA: Insufficient documentation

## 2012-10-11 DIAGNOSIS — R21 Rash and other nonspecific skin eruption: Secondary | ICD-10-CM

## 2012-10-11 NOTE — Addendum Note (Signed)
Addended by: Jennet Maduro D on: 10/11/2012 10:14 AM   Modules accepted: Orders

## 2012-10-11 NOTE — Progress Notes (Signed)
Patient ID: Benjamin Hunter, male   DOB: 1960/12/29, 52 y.o.   MRN: [id number]          [facility]  Patient Active Problem List   Diagnosis Date Noted  . HIV Hunter 07/17/2008    Priority: High  . Seasonal allergies 09/20/2011    Priority: Medium  . Polyarthritis 09/06/2011    Priority: Medium  . DM 03/16/2009    Priority: Medium  . HYPERLIPIDEMIA 07/17/2008    Priority: Medium  . ELEVATED BLOOD PRESSURE 07/17/2008    Priority: Medium  . Rash and nonspecific skin eruption 10/11/2012  . Recurrent low back pain 10/11/2012  . Left facial numbness 08/20/2012    Patient's Medications  New Prescriptions   No medications on file  Previous Medications   ASPIRIN 81 MG EC TABLET    Take 1 tablet (81 mg total) by mouth daily. Swallow whole.   ATORVASTATIN (LIPITOR) 40 MG TABLET    Take 40 mg by mouth daily.   BLOOD GLUCOSE MONITORING SUPPL (WAVESENSE PRESTO PRO METER) DEVI    by Does not apply route. 1 meter     FLUTICASONE (FLONASE) 50 MCG/ACT NASAL SPRAY    Place 2 sprays into the nose daily as needed for rhinitis or allergies.   GLIPIZIDE (GLUCOTROL) 10 MG TABLET    Take 1 tablet (10 mg total) by mouth daily.   GLUCOSE BLOOD (WAVESENSE PRESTO TEST) TEST STRIP    Use as directed   ISENTRESS 400 MG TABLET    TAKE 1 TABLET BY MOUTH TWICE DAILY   METFORMIN (GLUCOPHAGE) 500 MG TABLET    Take 1 tablet (500 mg total) by mouth daily.   TRIAMCINOLONE CREAM (KENALOG) 0.5 %    Apply 1 application topically 3 (three) times daily.   TRUVADA 200-300 MG PER TABLET    TAKE 1 TABLET BY MOUTH EVERY DAY  Modified Medications   No medications on file  Discontinued Medications   No medications on file    Subjective: Benjamin Hunter is seen on a work in basis. About 3 days after he started the lisinopril recently he developed a blistering rash on his palms. I instructed him to stop the lisinopril and the rash has been resolving. He is feeling better with the exception of  having developed recurrent right lower back pain. It is worse when he twists or rolls over in bed. He does not recall any injury prior to onset of pain. This is very similar to a previous episode. He has not had any fever, chills, sweats or noted any change in urination. He has not missed any doses of his other medications.  Objective: Temp: 97.9 F (36.6 C) (05/15 0935) Temp src: Oral (05/15 0935) BP: 133/81 mmHg (05/15 0935) Pulse Rate: 72 (05/15 0935)  General: He is in no distress Skin: He has some residual desquamation on his palms Oral: No oropharyngeal lesions Eyes: Normal external exam Lungs: Clear Cor: Regular S1 and S2 no murmurs Abdomen: Obese, soft and nontender Extremities: He has some dry peeling skin on the heels of both feet that is chronic and stable  Lab Results HIV 1 RNA Quant (copies/mL)  Date Value  08/10/2012 <20   05/15/2012 <20   02/01/2012 <20      CD4 T Cell Abs (cmm)  Date Value  08/10/2012 480   05/15/2012 700   02/01/2012 590      Assessment: He probably had an allergic reaction to lisinopril and it is resolving after stopping  it. I will not rechallenge him more start an ARB at this time. He has recurrent low back pain that is probably due to low back strain. I've given him written information about exercises and other things he can do to treat this episode improvement recurrences. His HIV infection is under excellent control. I offered nutrition counseling for his metabolic syndrome but he states that he would rather wait until his next visit.  Plan: 1.  discontinue lisinopril  2.  written information about low back pain provided  3.  followup after lab work in 3 months 4. Continue current antiretroviral regimen   Benjamin Asters, MD [facility] [phone] pager   [phone] cell 10/11/2012, 9:58 AM

## 2012-10-18 ENCOUNTER — Ambulatory Visit: Payer: No Typology Code available for payment source | Admitting: Internal Medicine

## 2012-10-24 ENCOUNTER — Encounter: Payer: Self-pay | Admitting: *Deleted

## 2012-11-09 ENCOUNTER — Other Ambulatory Visit: Payer: Self-pay | Admitting: *Deleted

## 2012-11-09 ENCOUNTER — Other Ambulatory Visit: Payer: Self-pay | Admitting: Licensed Clinical Social Worker

## 2012-11-09 ENCOUNTER — Encounter: Payer: Self-pay | Admitting: Infectious Diseases

## 2012-11-09 ENCOUNTER — Ambulatory Visit (INDEPENDENT_AMBULATORY_CARE_PROVIDER_SITE_OTHER): Payer: No Typology Code available for payment source | Admitting: Infectious Diseases

## 2012-11-09 VITALS — BP 145/93 | HR 80 | Temp 98.3°F | Ht 71.0 in | Wt 206.0 lb

## 2012-11-09 DIAGNOSIS — J302 Other seasonal allergic rhinitis: Secondary | ICD-10-CM

## 2012-11-09 DIAGNOSIS — J309 Allergic rhinitis, unspecified: Secondary | ICD-10-CM

## 2012-11-09 DIAGNOSIS — B20 Human immunodeficiency virus [HIV] disease: Secondary | ICD-10-CM

## 2012-11-09 MED ORDER — NEOMYCIN-POLYMYXIN-HC 3.5-10000-1 OT SOLN: 3.0000 [drp] | Freq: Four times a day (QID) | OTIC | Status: DC

## 2012-11-09 NOTE — Progress Notes (Signed)
  Subjective:    Patient ID: Benjamin Hunter, male    DOB: 1960/09/27, 52 y.o.   MRN: [id number]  Otalgia    52 yo M with HIV+, on ISN/TRV. Comes to clinic today as walk in for decreased hearing in his R ear. Also having back pain for a couple of months. No f/c. No headaches. States he had balance problems this AM.  No problems with art.   HIV 1 RNA Quant (copies/mL)  Date Value  08/10/2012 <20   05/15/2012 <20   02/01/2012 <20      CD4 T Cell Abs (cmm)  Date Value  08/10/2012 480   05/15/2012 700   02/01/2012 590      Review of Systems  HENT: Positive for ear pain.        Objective:   Physical Exam  Constitutional: He appears well-developed and well-nourished.  HENT:  Right Ear: There is tenderness. Tympanic membrane is retracted.  Left Ear: No drainage, swelling or tenderness.  No middle ear effusion.  Ears:  Mouth/Throat: No oropharyngeal exudate.  Eyes: EOM are normal. Pupils are equal, round, and reactive to light.  Neck: Neck supple.  Cardiovascular: Normal rate, regular rhythm and normal heart sounds.   Pulmonary/Chest: Effort normal and breath sounds normal.  Abdominal: Soft. Bowel sounds are normal. There is no tenderness. There is no rebound.  Lymphadenopathy:    He has no cervical adenopathy.          Assessment & Plan:

## 2012-11-09 NOTE — Assessment & Plan Note (Signed)
I suspect that his ear sx are related to this. I advised him to try otc claritin or zyrtec. Will give him rx for cortisporin to see if this will help the tenderness he has on exam. I asked him to call if he has worsening sx or no improvement over the next week.

## 2012-11-09 NOTE — Telephone Encounter (Signed)
Patient called and wanted this Rx sent to a different pharmacy.

## 2012-11-09 NOTE — Assessment & Plan Note (Signed)
Doing well, given condoms. Has f/u wth Dr Orvan Falconer in August.

## 2012-12-29 ENCOUNTER — Other Ambulatory Visit: Payer: Self-pay | Admitting: Internal Medicine

## 2012-12-29 DIAGNOSIS — B20 Human immunodeficiency virus [HIV] disease: Secondary | ICD-10-CM

## 2012-12-31 ENCOUNTER — Other Ambulatory Visit: Payer: Self-pay | Admitting: *Deleted

## 2012-12-31 ENCOUNTER — Ambulatory Visit: Payer: No Typology Code available for payment source

## 2012-12-31 ENCOUNTER — Other Ambulatory Visit (INDEPENDENT_AMBULATORY_CARE_PROVIDER_SITE_OTHER): Payer: No Typology Code available for payment source

## 2012-12-31 DIAGNOSIS — Z113 Encounter for screening for infections with a predominantly sexual mode of transmission: Secondary | ICD-10-CM

## 2012-12-31 DIAGNOSIS — B20 Human immunodeficiency virus [HIV] disease: Secondary | ICD-10-CM

## 2012-12-31 DIAGNOSIS — Z79899 Other long term (current) drug therapy: Secondary | ICD-10-CM

## 2012-12-31 DIAGNOSIS — E119 Type 2 diabetes mellitus without complications: Secondary | ICD-10-CM

## 2012-12-31 LAB — LIPID PANEL
Cholesterol: 180 mg/dL (ref 0–200)
LDL Cholesterol: 113 mg/dL — ABNORMAL HIGH (ref 0–99)
VLDL: 35 mg/dL (ref 0–40)

## 2012-12-31 LAB — HEMOGLOBIN A1C
Hgb A1c MFr Bld: 5.3 % (ref <5.7)
Mean Plasma Glucose: 105 mg/dL (ref <117)

## 2013-01-01 LAB — HIV-1 RNA QUANT-NO REFLEX-BLD
HIV 1 RNA Quant: <20 copies/mL (ref <20)
HIV-1 RNA Quant, Log: <1.3 {Log} (ref <1.30)

## 2013-01-14 ENCOUNTER — Ambulatory Visit (INDEPENDENT_AMBULATORY_CARE_PROVIDER_SITE_OTHER): Payer: No Typology Code available for payment source | Admitting: Internal Medicine

## 2013-01-14 ENCOUNTER — Encounter: Payer: Self-pay | Admitting: Internal Medicine

## 2013-01-14 ENCOUNTER — Ambulatory Visit: Payer: No Typology Code available for payment source

## 2013-01-14 VITALS — BP 153/99 | HR 76 | Temp 98.1°F | Ht 71.0 in | Wt 212.5 lb

## 2013-01-14 DIAGNOSIS — R030 Elevated blood-pressure reading, without diagnosis of hypertension: Secondary | ICD-10-CM

## 2013-01-14 DIAGNOSIS — F329 Major depressive disorder, single episode, unspecified: Secondary | ICD-10-CM | POA: Insufficient documentation

## 2013-01-14 DIAGNOSIS — B20 Human immunodeficiency virus [HIV] disease: Secondary | ICD-10-CM

## 2013-01-14 DIAGNOSIS — F32A Depression, unspecified: Secondary | ICD-10-CM | POA: Insufficient documentation

## 2013-01-14 MED ORDER — AMLODIPINE BESYLATE 5 MG PO TABS: 5.0000 mg | ORAL_TABLET | Freq: Every day | ORAL | Status: DC

## 2013-01-14 NOTE — Progress Notes (Signed)
Patient ID: Benjamin Hunter, male   DOB: [birth year], 52 y.o.   MRN: [id number]          [facility] for Infectious Disease  Patient Active Problem List   Diagnosis Date Noted  . HIV DISEASE 07/17/2008    Priority: High  . Seasonal allergies 09/20/2011    Priority: Medium  . Polyarthritis 09/06/2011    Priority: Medium  . DM 03/16/2009    Priority: Medium  . HYPERLIPIDEMIA 07/17/2008    Priority: Medium  . Hypertension 07/17/2008    Priority: Medium  . Depression 01/14/2013  . Rash and nonspecific skin eruption 10/11/2012  . Recurrent low back pain 10/11/2012  . Left facial numbness 08/20/2012    Patient's Medications  New Prescriptions   AMLODIPINE (NORVASC) 5 MG TABLET    Take 1 tablet (5 mg total) by mouth daily.  Previous Medications   ASPIRIN 81 MG EC TABLET    Take 1 tablet (81 mg total) by mouth daily. Swallow whole.   ATORVASTATIN (LIPITOR) 40 MG TABLET    Take 40 mg by mouth daily.   BLOOD GLUCOSE MONITORING SUPPL (WAVESENSE PRESTO PRO METER) DEVI    by Does not apply route. 1 meter     FLUTICASONE (FLONASE) 50 MCG/ACT NASAL SPRAY    Place 2 sprays into the nose daily as needed for rhinitis or allergies.   GLIPIZIDE (GLUCOTROL) 10 MG TABLET    Take 1 tablet (10 mg total) by mouth daily.   GLUCOSE BLOOD (WAVESENSE PRESTO TEST) TEST STRIP    Use as directed   ISENTRESS 400 MG TABLET    TAKE 1 TABLET BY MOUTH TWICE DAILY   METFORMIN (GLUCOPHAGE) 500 MG TABLET    Take 1 tablet (500 mg total) by mouth daily.   TRUVADA 200-300 MG PER TABLET    TAKE 1 TABLET BY MOUTH EVERY DAY  Modified Medications   No medications on file  Discontinued Medications   NEOMYCIN-POLYMYXIN-HYDROCORTISONE (CORTISPORIN) OTIC SOLUTION    Place 3 drops into the right ear 4 (four) times daily.   TRIAMCINOLONE CREAM (KENALOG) 0.5 %    Apply 1 application topically 3 (three) times daily.    Subjective: Benjamin Hunter is in for his routine visit. He has not missed any doses of his medications.  He is struggling more the responsibility of caring for his mother who is in declining health. She weighs about 250 pounds and he has to assist her getting up to the bathroom about 7 times daily. He continues to be bothered by right mid back pain. He takes acetaminophen with some relief. He states that he is feeling more depressed about his mother's health and his known problems obtaining disability. He says his blood sugars have generally been around 100 and he checks them daily. Review of Systems: Constitutional: positive for malaise, negative for anorexia, chills, fevers, sweats and weight loss Eyes: negative Ears, nose, mouth, throat, and face: negative Respiratory: negative Cardiovascular: negative Gastrointestinal: negative Genitourinary:negative  Past Medical History  Diagnosis Date  . Diabetes mellitus   . HIV (human immunodeficiency virus infection)   . Hyperlipidemia   . Hypertension   . Arthritis   . Depression     History  Substance Use Topics  . Smoking status: Never Smoker   . Smokeless tobacco: Never Used  . Alcohol Use: 0.5 oz/week    1 drink(s) per week     Comment: 1 can beer a week    Family History  Problem Relation Age of Onset  .  Hypertension Mother     Allergies  Allergen Reactions  . Lisinopril     Palmar rash     Objective: Temp: 98.1 F (36.7 C) (08/18 1431) Temp src: Oral (08/18 1431) BP: 153/99 mmHg (08/18 1431) Pulse Rate: 76 (08/18 1431)  General: He seems slightly more quiet and reserved than usual Oral: No oropharyngeal lesions Skin: No rash Lungs: Clear Cor: Regular S1 and S2 no murmurs Abdomen: Obese, soft nontender Joints and extremities: Mild discomfort with palpation of his right mid back. No acute abnormalities of his joints and extremities. His bilateral synovitis of his hands has resolved. Neuro: Alert with normal speech and conversation   Lab Results HIV 1 RNA Quant (copies/mL)  Date Value  12/31/2012 <20   08/10/2012  <20   05/15/2012 <20      CD4 T Cell Abs (cmm)  Date Value  12/31/2012 550   08/10/2012 480   05/15/2012 700      Lab Results  Component Value Date   WBC 6.5 08/18/2012   HGB 12.9* 08/18/2012   HCT 37.4* 08/18/2012   MCV 88.2 08/18/2012   PLT 184 08/18/2012   BMET    Component Value Date/Time   NA 136 08/18/2012 1502   K 3.7 08/18/2012 1502   CL 101 08/18/2012 1502   CO2 24 08/18/2012 1502   GLUCOSE 168* 08/18/2012 1502   BUN 17 08/18/2012 1502   CREATININE 1.08 08/18/2012 1502   CREATININE 1.14 08/10/2012 1037   CALCIUM 9.1 08/18/2012 1502   GFRNONAA 77* 08/18/2012 1502   GFRAA 89* 08/18/2012 1502   Lab Results  Component Value Date   ALT 21 08/10/2012   AST 20 08/10/2012   ALKPHOS 97 08/10/2012   BILITOT 0.5 08/10/2012   Lab Results  Component Value Date   CHOL 180 12/31/2012   HDL 32* 12/31/2012   LDLCALC 113* 12/31/2012   TRIG 175* 12/31/2012   CHOLHDL 5.6 12/31/2012   Lab Results  Component Value Date   HGBA1C 5.3 12/31/2012   Assessment: His HIV infection remains under excellent control. I will continue his current regimen consider simplification to Truvada plus Tivicay at the time of his next visit.  His hemoglobin A1c is at goal.  His blood pressure remains elevated. I will start amlodipine.  His LDL cholesterol is slightly elevated today. I will continue current dose of his statin and repeat his lipid panel in 3 months.  We will make a referral to see Franne Forts for mental health counseling.  Plan: 1. Continue current medications 2. Start amlodipine 5 mg daily 3. Mental health counseling 4. Followup after lab work in 3 months   [name], MD [facility] for Infectious Disease [facility] [phone] pager   [phone] cell 01/14/2013, 2:56 PM

## 2013-02-07 ENCOUNTER — Ambulatory Visit: Payer: No Typology Code available for payment source

## 2013-02-07 DIAGNOSIS — F4320 Adjustment disorder, unspecified: Secondary | ICD-10-CM

## 2013-02-07 DIAGNOSIS — F411 Generalized anxiety disorder: Secondary | ICD-10-CM

## 2013-02-07 NOTE — Progress Notes (Signed)
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  He said he has to help her go to the bathroom, cook, clean, do laundry, and all the shopping for the 2 of them.  He said that if he decides to go out of the house to visit friends, she tries to make him feel guilty for leaving her there.  He is an only child and the only other family member nearby is a cousin.  He says he sleeps around 5 to 6 hours per night, but he gets a nap most days.  He says his appetite has been good.  He rated his mood at 6 on a 10 pt scale (10 being good).  He reports daily low grade anxiety, but about once a week it climbs to a 9 on a 10 pt scale (10 being bad).  He has applied for disability for many years and has been turned down.  He has an appeal hearing next month.  He agreed to call for an appointment if he felt he needed to come back in.

## 2013-02-20 ENCOUNTER — Encounter: Payer: Self-pay | Admitting: *Deleted

## 2013-03-04 ENCOUNTER — Telehealth: Payer: Self-pay

## 2013-03-04 MED ORDER — METFORMIN HCL 500 MG PO TABS: 500.0000 mg | ORAL_TABLET | Freq: Two times a day (BID) | ORAL | Status: DC

## 2013-03-04 NOTE — Addendum Note (Signed)
Addended by: [name] on: 03/04/2013 11:52 AM   Modules accepted: Orders

## 2013-03-04 NOTE — Telephone Encounter (Signed)
Pt states his blood sugars have been running high. Previously he was splitting the metformin 500 mg tablet when he should have been taking one twice daily. Blood sugar this am was 338 and all have been running in the 300's lately. He is now taking one whole pill and wants to know if he can increase to twice daily.   Pt was advised to increase to one tablet twice daily and continue to check blood sugars.  He should see a decrease and if not he should call the office for further instructions.    Laurell Josephs, RN

## 2013-04-04 ENCOUNTER — Other Ambulatory Visit: Payer: Self-pay

## 2013-04-11 ENCOUNTER — Other Ambulatory Visit (INDEPENDENT_AMBULATORY_CARE_PROVIDER_SITE_OTHER): Payer: No Typology Code available for payment source

## 2013-04-11 ENCOUNTER — Telehealth: Payer: Self-pay | Admitting: *Deleted

## 2013-04-11 DIAGNOSIS — B20 Human immunodeficiency virus [HIV] disease: Secondary | ICD-10-CM

## 2013-04-11 DIAGNOSIS — Z79899 Other long term (current) drug therapy: Secondary | ICD-10-CM

## 2013-04-11 DIAGNOSIS — E119 Type 2 diabetes mellitus without complications: Secondary | ICD-10-CM

## 2013-04-11 MED ORDER — GLIPIZIDE 10 MG PO TABS: 10.0000 mg | ORAL_TABLET | Freq: Every day | ORAL | Status: DC

## 2013-04-11 NOTE — Telephone Encounter (Signed)
Glipizide is not covered by [employer].  Sent rx back to [facility].  Informed pt where to pick up rx.

## 2013-04-11 NOTE — Telephone Encounter (Signed)
Pt requested Glipizide rx be sent to [facility].  Under ADAP now.

## 2013-04-12 LAB — T-HELPER CELL (CD4) - (RCID CLINIC ONLY)
CD4 % Helper T Cell: 39 % (ref 33–55)
CD4 T Cell Abs: 590 /uL (ref 400–2700)

## 2013-04-14 LAB — HIV-1 RNA QUANT-NO REFLEX-BLD
HIV 1 RNA Quant: <20 copies/mL (ref <20)
HIV-1 RNA Quant, Log: <1.3 {Log} (ref <1.30)

## 2013-04-15 ENCOUNTER — Other Ambulatory Visit: Payer: Self-pay

## 2013-04-23 ENCOUNTER — Telehealth: Payer: Self-pay | Admitting: *Deleted

## 2013-04-23 NOTE — Telephone Encounter (Signed)
Disability paperwork received from [facility]ability 04/23/13.  They are requesting records from 11/18/11 - present as well as forms to be filled out by Dr. Orvan Falconer.  Copy made, gave original to Dr. Orvan Falconer. Andree Coss, RN

## 2013-04-29 ENCOUNTER — Telehealth: Payer: Self-pay | Admitting: *Deleted

## 2013-04-29 ENCOUNTER — Encounter: Payer: Self-pay | Admitting: *Deleted

## 2013-04-29 NOTE — Progress Notes (Signed)
Patient ID: Benjamin Hunter, male   DOB: November 04, 1960, 52 y.o.   MRN: [id number] Original [place] Disability document placed in Dr. Blair Dolphin box for completion.

## 2013-04-29 NOTE — Telephone Encounter (Signed)
2nd request received from [facility] Disability received 04/29/13.  1st request received 04/23/13, we were closed over the Thanksgiving holiday.  Placed 1st request in Dr. Blair Dolphin box for him to address the survey, 2nd request in Medical Records folder for them to fulfill the records request.  Copy made for our records. Andree Coss, RN

## 2013-05-02 ENCOUNTER — Encounter: Payer: Self-pay | Admitting: Internal Medicine

## 2013-05-02 ENCOUNTER — Ambulatory Visit (INDEPENDENT_AMBULATORY_CARE_PROVIDER_SITE_OTHER): Payer: No Typology Code available for payment source | Admitting: Internal Medicine

## 2013-05-02 VITALS — BP 139/92 | HR 86 | Temp 98.7°F | Ht 71.0 in | Wt 191.0 lb

## 2013-05-02 DIAGNOSIS — B20 Human immunodeficiency virus [HIV] disease: Secondary | ICD-10-CM

## 2013-05-02 DIAGNOSIS — Z23 Encounter for immunization: Secondary | ICD-10-CM

## 2013-05-02 NOTE — Progress Notes (Signed)
Patient ID: Benjamin Hunter, male   DOB: [birth year], 52 y.o.   MRN: [id number]          [facility] for Infectious Disease  Patient Active Problem List   Diagnosis Date Noted  . HIV DISEASE 07/17/2008    Priority: High  . Seasonal allergies 09/20/2011    Priority: Medium  . Polyarthritis 09/06/2011    Priority: Medium  . DM 03/16/2009    Priority: Medium  . HYPERLIPIDEMIA 07/17/2008    Priority: Medium  . Hypertension 07/17/2008    Priority: Medium  . Depression 01/14/2013  . Rash and nonspecific skin eruption 10/11/2012  . Recurrent low back pain 10/11/2012  . Left facial numbness 08/20/2012    Patient's Medications  New Prescriptions   No medications on file  Previous Medications   AMLODIPINE (NORVASC) 5 MG TABLET    Take 1 tablet (5 mg total) by mouth daily.   ASPIRIN 81 MG EC TABLET    Take 1 tablet (81 mg total) by mouth daily. Swallow whole.   ATORVASTATIN (LIPITOR) 40 MG TABLET    Take 40 mg by mouth daily.   BLOOD GLUCOSE MONITORING SUPPL (WAVESENSE PRESTO PRO METER) DEVI    by Does not apply route. 1 meter     FLUTICASONE (FLONASE) 50 MCG/ACT NASAL SPRAY    Place 2 sprays into the nose daily as needed for rhinitis or allergies.   GLIPIZIDE (GLUCOTROL) 10 MG TABLET    Take 1 tablet (10 mg total) by mouth daily.   GLUCOSE BLOOD (WAVESENSE PRESTO TEST) TEST STRIP    Use as directed   ISENTRESS 400 MG TABLET    TAKE 1 TABLET BY MOUTH TWICE DAILY   METFORMIN (GLUCOPHAGE) 500 MG TABLET    Take 1 tablet (500 mg total) by mouth 2 (two) times daily with a meal.   TRUVADA 200-300 MG PER TABLET    TAKE 1 TABLET BY MOUTH EVERY DAY  Modified Medications   No medications on file  Discontinued Medications   No medications on file    Subjective: Benjamin Hunter is in for his routine visit. He does not believe he is missed any doses of his Truvada or Isentress since his last visit. He is having no problems tolerating those medications.  He was able to increase his metformin  to twice daily and has been tolerating it much better. He states that he did liberalize his diet over the Thanksgiving holiday and was drinking more sodas. He is noted that his blood sugars have been much higher and were actually slightly over 500 yesterday. He is already made the decision to go back to his regular diet.  Is not bothered by his seasonal allergies at this time.  He is having any significant pain in his hands like he was earlier this year but he still has some morning stiffness and weakness. He has been having some left medial knee pain recently that makes it difficult to stand for more than 20 minutes.  Review of Systems: Constitutional: negative Eyes: positive for mild blurred vision recently, negative for irritation and redness Ears, nose, mouth, throat, and face: negative Respiratory: negative Cardiovascular: negative Gastrointestinal: negative Genitourinary:positive for frequency and nocturia, negative for dysuria  Past Medical History  Diagnosis Date  . Diabetes mellitus   . HIV (human immunodeficiency virus infection)   . Hyperlipidemia   . Hypertension   . Arthritis   . Depression     History  Substance Use Topics  . Smoking status: Never  Smoker   . Smokeless tobacco: Never Used  . Alcohol Use: 0.5 oz/week    1 drink(s) per week     Comment: 1 can beer a week    Family History  Problem Relation Age of Onset  . Hypertension Mother     Allergies  Allergen Reactions  . Lisinopril     Palmar rash     Objective: Temp: 98.7 F (37.1 C) (12/04 1033) Temp src: Oral (12/04 1033) BP: 139/92 mmHg (12/04 1033) Pulse Rate: 86 (12/04 1033)  General: He is in no distress Oral: No oropharyngeal lesions Skin: No rash Lungs: Clear Cor: Regular S1 and S2 with no murmurs Abdomen: Soft and nontender Joints and extremities: His previous synovitis in his hands and fingers has resolved and there are no acute abnormalities currently. He does have some point  tenderness on his left medial knee at the joint line without any other signs of inflammation. Neuro: Alert with normal speech and conversation Mood and affect: Normal and bright  Lab Results HIV 1 RNA Quant (copies/mL)  Date Value  04/11/2013 <20   12/31/2012 <20   08/10/2012 <20      CD4 T Cell Abs (/uL)  Date Value  04/11/2013 590   12/31/2012 550   08/10/2012 480      Lab Results  Component Value Date   WBC 6.5 08/18/2012   HGB 12.9* 08/18/2012   HCT 37.4* 08/18/2012   MCV 88.2 08/18/2012   PLT 184 08/18/2012   Lab Results  Component Value Date   ALT 21 08/10/2012   AST 20 08/10/2012   ALKPHOS 97 08/10/2012   BILITOT 0.5 08/10/2012   Lab Results  Component Value Date   CHOL 159 04/11/2013   HDL 28* 04/11/2013   LDLCALC 113* 04/11/2013   TRIG 92 04/11/2013   CHOLHDL 5.7 04/11/2013   Lab Results  Component Value Date   HGBA1C 5.3 12/31/2012   Assessment: His HIV infection remains under excellent control. I talked to him about the possibility of simplifying his regimen to a once daily combination of Truvada and Tivicay. He would like to think about that.  His A1c was at goal 4 months ago but he has had recent hyperglycemia related to dietary indiscretion. His only made the decision to go back to his routine diet.  He still has blood pressure slightly above goal and mild dyslipidemia. I will try to help him establish primary care again.  Plan: 1. Continue current antiretroviral therapy 2. Influenza vaccination today 3. Refer for a primary care 4. Followup after lab work in 3 months   [name], MD [facility] for Infectious Disease [facility] [phone] pager   [phone] cell 05/02/2013, 4:53 PM

## 2013-05-14 ENCOUNTER — Other Ambulatory Visit: Payer: Self-pay | Admitting: *Deleted

## 2013-05-14 ENCOUNTER — Ambulatory Visit: Payer: No Typology Code available for payment source | Attending: Internal Medicine

## 2013-05-14 DIAGNOSIS — E119 Type 2 diabetes mellitus without complications: Secondary | ICD-10-CM

## 2013-05-14 MED ORDER — GLIPIZIDE 10 MG PO TABS: 10.0000 mg | ORAL_TABLET | Freq: Every day | ORAL | Status: DC

## 2013-06-07 ENCOUNTER — Other Ambulatory Visit: Payer: Self-pay | Admitting: Internal Medicine

## 2013-06-07 DIAGNOSIS — I10 Essential (primary) hypertension: Secondary | ICD-10-CM

## 2013-06-17 ENCOUNTER — Ambulatory Visit: Payer: No Typology Code available for payment source | Attending: Internal Medicine | Admitting: Internal Medicine

## 2013-06-17 ENCOUNTER — Encounter: Payer: Self-pay | Admitting: Internal Medicine

## 2013-06-17 VITALS — BP 127/85 | HR 91 | Temp 98.2°F | Resp 16 | Ht 71.0 in | Wt 195.0 lb

## 2013-06-17 DIAGNOSIS — E785 Hyperlipidemia, unspecified: Secondary | ICD-10-CM | POA: Insufficient documentation

## 2013-06-17 DIAGNOSIS — I10 Essential (primary) hypertension: Secondary | ICD-10-CM

## 2013-06-17 DIAGNOSIS — Z21 Asymptomatic human immunodeficiency virus [HIV] infection status: Secondary | ICD-10-CM | POA: Insufficient documentation

## 2013-06-17 DIAGNOSIS — E119 Type 2 diabetes mellitus without complications: Secondary | ICD-10-CM | POA: Insufficient documentation

## 2013-06-17 LAB — GLUCOSE, POCT (MANUAL RESULT ENTRY): POC Glucose: 248 mg/dl — AB (ref 70–99)

## 2013-06-17 LAB — POCT GLYCOSYLATED HEMOGLOBIN (HGB A1C): Hemoglobin A1C: 8.1

## 2013-06-17 NOTE — Progress Notes (Signed)
Pt here to establish care for medical management Diabetes,HTN Need A1c and cbg Pt is seeing Dr. Orvan Falconerampbell ID doctor No medication need refill at this time

## 2013-06-17 NOTE — Patient Instructions (Signed)
Diabetes Meal Planning Guide The diabetes meal planning guide is a tool to help you plan your meals and snacks. It is important for people with diabetes to manage their blood glucose (sugar) levels. Choosing the right foods and the right amounts throughout your day will help control your blood glucose. Eating right can even help you improve your blood pressure and reach or maintain a healthy weight. CARBOHYDRATE COUNTING MADE EASY When you eat carbohydrates, they turn to sugar. This raises your blood glucose level. Counting carbohydrates can help you control this level so you feel better. When you plan your meals by counting carbohydrates, you can have more flexibility in what you eat and balance your medicine with your food intake. Carbohydrate counting simply means adding up the total amount of carbohydrate grams in your meals and snacks. Try to eat about the same amount at each meal. Foods with carbohydrates are listed below. Each portion below is 1 carbohydrate serving or 15 grams of carbohydrates. Ask your dietician how many grams of carbohydrates you should eat at each meal or snack. Grains and Starches  1 slice bread.   English muffin or hotdog/hamburger bun.   cup cold cereal (unsweetened).   cup cooked pasta or rice.   cup starchy vegetables (corn, potatoes, peas, beans, winter squash).  1 tortilla (6 inches).   bagel.  1 waffle or pancake (size of a CD).   cup cooked cereal.  4 to 6 small crackers. *Whole grain is recommended. Fruit  1 cup fresh unsweetened berries, melon, papaya, pineapple.  1 small fresh fruit.   banana or mango.   cup fruit juice (4 oz unsweetened).   cup canned fruit in natural juice or water.  2 tbs dried fruit.  12 to 15 grapes or cherries. Milk and Yogurt  1 cup fat-free or 1% milk.  1 cup soy milk.  6 oz light yogurt with sugar-free sweetener.  6 oz low-fat soy yogurt.  6 oz plain yogurt. Vegetables  1 cup raw or  cup  cooked is counted as 0 carbohydrates or a "free" food.  If you eat 3 or more servings at 1 meal, count them as 1 carbohydrate serving. Other Carbohydrates   oz chips or pretzels.   cup ice cream or frozen yogurt.   cup sherbet or sorbet.  2 inch square cake, no frosting.  1 tbs honey, sugar, jam, jelly, or syrup.  2 small cookies.  3 squares of graham crackers.  3 cups popcorn.  6 crackers.  1 cup broth-based soup.  Count 1 cup casserole or other mixed foods as 2 carbohydrate servings.  Foods with less than 20 calories in a serving may be counted as 0 carbohydrates or a "free" food. You may want to purchase a book or computer software that lists the carbohydrate gram counts of different foods. In addition, the nutrition facts panel on the labels of the foods you eat are a good source of this information. The label will tell you how big the serving size is and the total number of carbohydrate grams you will be eating per serving. Divide this number by 15 to obtain the number of carbohydrate servings in a portion. Remember, 1 carbohydrate serving equals 15 grams of carbohydrate. SERVING SIZES Measuring foods and serving sizes helps you make sure you are getting the right amount of food. The list below tells how big or small some common serving sizes are.  1 oz.........4 stacked dice.  3 oz.........Deck of cards.  1 tsp........Tip   of little finger.  1 tbs........Thumb.  2 tbs........Golf ball.   cup.......Half of a fist.  1 cup........A fist. SAMPLE DIABETES MEAL PLAN Below is a sample meal plan that includes foods from the grain and starches, dairy, vegetable, fruit, and meat groups. A dietician can individualize a meal plan to fit your calorie needs and tell you the number of servings needed from each food group. However, controlling the total amount of carbohydrates in your meal or snack is more important than making sure you include all of the food groups at every  meal. You may interchange carbohydrate containing foods (dairy, starches, and fruits). The meal plan below is an example of a 2000 calorie diet using carbohydrate counting. This meal plan has 17 carbohydrate servings. Breakfast  1 cup oatmeal (2 carb servings).   cup light yogurt (1 carb serving).  1 cup blueberries (1 carb serving).   cup almonds. Snack  1 large apple (2 carb servings).  1 low-fat string cheese stick. Lunch  Chicken breast salad.  1 cup spinach.   cup chopped tomatoes.  2 oz chicken breast, sliced.  2 tbs low-fat Italian dressing.  12 whole-wheat crackers (2 carb servings).  12 to 15 grapes (1 carb serving).  1 cup low-fat milk (1 carb serving). Snack  1 cup carrots.   cup hummus (1 carb serving). Dinner  3 oz broiled salmon.  1 cup brown rice (3 carb servings). Snack  1  cups steamed broccoli (1 carb serving) drizzled with 1 tsp olive oil and lemon juice.  1 cup light pudding (2 carb servings). DIABETES MEAL PLANNING WORKSHEET Your dietician can use this worksheet to help you decide how many servings of foods and what types of foods are right for you.  BREAKFAST Food Group and Servings / Carb Servings Grain/Starches __________________________________ Dairy __________________________________________ Vegetable ______________________________________ Fruit ___________________________________________ Meat __________________________________________ Fat ____________________________________________ LUNCH Food Group and Servings / Carb Servings Grain/Starches ___________________________________ Dairy ___________________________________________ Fruit ____________________________________________ Meat ___________________________________________ Fat _____________________________________________ DINNER Food Group and Servings / Carb Servings Grain/Starches ___________________________________ Dairy  ___________________________________________ Fruit ____________________________________________ Meat ___________________________________________ Fat _____________________________________________ SNACKS Food Group and Servings / Carb Servings Grain/Starches ___________________________________ Dairy ___________________________________________ Vegetable _______________________________________ Fruit ____________________________________________ Meat ___________________________________________ Fat _____________________________________________ DAILY TOTALS Starches _________________________ Vegetable ________________________ Fruit ____________________________ Dairy ____________________________ Meat ____________________________ Fat ______________________________ Document Released: 02/10/2005 Document Revised: 08/08/2011 Document Reviewed: 12/22/2008 ExitCare Patient Information 2014 [employer]. Diabetes and Exercise Exercising regularly is important. It is not just about losing weight. It has many health benefits, such as:  Improving your overall fitness, flexibility, and endurance.  Increasing your bone density.  Helping with weight control.  Decreasing your body fat.  Increasing your muscle strength.  Reducing stress and tension.  Improving your overall health. People with diabetes who exercise gain additional benefits because exercise:  Reduces appetite.  Improves the body's use of blood sugar (glucose).  Helps lower or control blood glucose.  Decreases blood pressure.  Helps control blood lipids (such as cholesterol and triglycerides).  Improves the body's use of the hormone insulin by:  Increasing the body's insulin sensitivity.  Reducing the body's insulin needs.  Decreases the risk for heart disease because exercising:  Lowers cholesterol and triglycerides levels.  Increases the levels of good cholesterol (such as high-density lipoproteins [HDL]) in the  body.  Lowers blood glucose levels. YOUR ACTIVITY PLAN  Choose an activity that you enjoy and set realistic goals. Your health care provider or diabetes educator can help you make an activity plan that works for you. You   can break activities into 2 or 3 sessions throughout the day. Doing so is as good as one long session. Exercise ideas include:  Taking the dog for a walk.  Taking the stairs instead of the elevator.  Dancing to your favorite song.  Doing your favorite exercise with a friend. RECOMMENDATIONS FOR EXERCISING WITH TYPE 1 OR TYPE 2 DIABETES   Check your blood glucose before exercising. If blood glucose levels are greater than 240 mg/dL, check for urine ketones. Do not exercise if ketones are present.  Avoid injecting insulin into areas of the body that are going to be exercised. For example, avoid injecting insulin into:  The arms when playing tennis.  The legs when jogging.  Keep a record of:  Food intake before and after you exercise.  Expected peak times of insulin action.  Blood glucose levels before and after you exercise.  The type and amount of exercise you have done.  Review your records with your health care provider. Your health care provider will help you to develop guidelines for adjusting food intake and insulin amounts before and after exercising.  If you take insulin or oral hypoglycemic agents, watch for signs and symptoms of hypoglycemia. They include:  Dizziness.  Shaking.  Sweating.  Chills.  Confusion.  Drink plenty of water while you exercise to prevent dehydration or heat stroke. Body water is lost during exercise and must be replaced.  Talk to your health care provider before starting an exercise program to make sure it is safe for you. Remember, almost any type of activity is better than none. Document Released: 08/06/2003 Document Revised: 01/16/2013 Document Reviewed: 10/23/2012 ExitCare Patient Information 2014 [employer]  LL[employer].  

## 2013-06-17 NOTE — Progress Notes (Signed)
Patient ID: GEORDIE HAKE, male   DOB: 1961-04-16, 53 y.o.   MRN: [id number] Patient Demographics  Jocob Zaloudek, is a 53 y.o. male  NWG:[id number]  [id number]:[id number]  DOB - March 02, 1961  CC:  Chief Complaint  Patient presents with  . Establish Care  . Diabetes  . HIV Positive/AIDS       HPI: Jerik Yildiz is a 53 y.o. male here today to establish medical care. Patient has history of hypertension, diabetes, major depression, dyslipidemia, HIV well-controlled on HAART, last HIV RNA quantitative was less than 20 copies and CD4 count of 590. He is here today to establish medical care as directed by his infectious disease specialist because of diabetes and hypertension. His last hemoglobin A1c was 6.0 the patient told me lately he has been eating a lot of sugar and drinking lots of soda throughout the holidays season till present. He CBG today is 248 her hemoglobin A1c has gone up to 8.1%. Patient has no complaint. He claims compliant on all medications. He follows a fairly regularly with infectious disease specialists and psychotherapist. He has all his medications. Patient has No headache, No chest pain, No abdominal pain - No Nausea, No new weakness tingling or numbness, No Cough - SOB. Patient does not smoke cigarette, drinks alcohol occasionally.  Allergies  Allergen Reactions  . Lisinopril     Palmar rash    Past Medical History  Diagnosis Date  . Diabetes mellitus   . HIV (human immunodeficiency virus infection)   . Hyperlipidemia   . Hypertension   . Arthritis   . Depression    Current Outpatient Prescriptions on File Prior to Visit  Medication Sig Dispense Refill  . aspirin 81 MG EC tablet Take 1 tablet (81 mg total) by mouth daily. Swallow whole.  30 tablet  12  . atorvastatin (LIPITOR) 40 MG tablet Take 40 mg by mouth daily.      [name] glipiZIDE (GLUCOTROL) 10 MG tablet Take 1 tablet (10 mg total) by mouth daily.  90 tablet  3  . ISENTRESS 400 MG tablet TAKE 1 TABLET BY  MOUTH TWICE DAILY  60 tablet  5  . metFORMIN (GLUCOPHAGE) 500 MG tablet Take 1 tablet (500 mg total) by mouth 2 (two) times daily with a meal.  60 tablet  11  . TRUVADA 200-300 MG per tablet TAKE 1 TABLET BY MOUTH EVERY DAY  30 tablet  5  . amLODipine (NORVASC) 5 MG tablet TAKE 1 TABLET BY MOUTH ONCE DAILY  30 tablet  11  . Blood Glucose Monitoring Suppl (WAVESENSE PRESTO PRO METER) DEVI by Does not apply route. 1 meter        . fluticasone (FLONASE) 50 MCG/ACT nasal spray Place 2 sprays into the nose daily as needed for rhinitis or allergies.      [name] glucose blood (WAVESENSE PRESTO TEST) test strip Use as directed  100 each  11   No current facility-administered medications on file prior to visit.   Family History  Problem Relation Age of Onset  . Hypertension Mother    History   Social History  . Marital Status: Single    Spouse Name: N/A    Number of Children: N/A  . Years of Education: N/A   Occupational History  . Not on file.   Social History Main Topics  . Smoking status: Never Smoker   . Smokeless tobacco: Never Used  . Alcohol Use: 0.5 oz/week    1 drink(s) per week  Comment: 1 can beer a week  . Drug Use: No  . Sexual Activity: Not Currently     Comment: declined condoms today   Other Topics Concern  . Not on file   Social History Narrative  . No narrative on file    Review of Systems: Constitutional: Negative for fever, chills, diaphoresis, activity change, appetite change and fatigue. HENT: Negative for ear pain, nosebleeds, congestion, facial swelling, rhinorrhea, neck pain, neck stiffness and ear discharge.  Eyes: Negative for pain, discharge, redness, itching and visual disturbance. Respiratory: Negative for cough, choking, chest tightness, shortness of breath, wheezing and stridor.  Cardiovascular: Negative for chest pain, palpitations and leg swelling. Gastrointestinal: Negative for abdominal distention. Genitourinary: Negative for dysuria,  urgency, frequency, hematuria, flank pain, decreased urine volume, difficulty urinating and dyspareunia.  Musculoskeletal: Negative for back pain, joint swelling, arthralgia and gait problem. Neurological: Negative for dizziness, tremors, seizures, syncope, facial asymmetry, speech difficulty, weakness, light-headedness, numbness and headaches.  Hematological: Negative for adenopathy. Does not bruise/bleed easily. Psychiatric/Behavioral: Negative for hallucinations, behavioral problems, confusion, dysphoric mood, decreased concentration and agitation.    Objective:   Filed Vitals:   06/17/13 1416  BP: 127/85  Pulse: 91  Temp: 98.2 F (36.8 C)  Resp: 16    Physical Exam: Constitutional: Patient appears well-developed and well-nourished. No distress. HENT: Normocephalic, atraumatic, External right and left ear normal. Oropharynx is clear and moist.  Eyes: Conjunctivae and EOM are normal. PERRLA, no scleral icterus. Neck: Normal ROM. Neck supple. No JVD. No tracheal deviation. No thyromegaly. CVS: RRR, S1/S2 +, no murmurs, no gallops, no carotid bruit.  Pulmonary: Effort and breath sounds normal, no stridor, rhonchi, wheezes, rales.  Abdominal: Soft. BS +, no distension, tenderness, rebound or guarding.  Musculoskeletal: Normal range of motion. No edema and no tenderness.  Lymphadenopathy: No lymphadenopathy noted, cervical, inguinal or axillary Neuro: Alert. Normal reflexes, muscle tone coordination. No cranial nerve deficit. Skin: Skin is warm and dry. No rash noted. Not diaphoretic. No erythema. No pallor. Psychiatric: Normal mood and affect. Behavior, judgment, thought content normal.  Lab Results  Component Value Date   WBC 6.5 08/18/2012   HGB 12.9* 08/18/2012   HCT 37.4* 08/18/2012   MCV 88.2 08/18/2012   PLT 184 08/18/2012   Lab Results  Component Value Date   CREATININE 1.08 08/18/2012   BUN 17 08/18/2012   NA 136 08/18/2012   K 3.7 08/18/2012   CL 101 08/18/2012   CO2 24  08/18/2012    Lab Results  Component Value Date   HGBA1C 5.3 12/31/2012   Lipid Panel     Component Value Date/Time   CHOL 159 04/11/2013 1118   TRIG 92 04/11/2013 1118   HDL 28* 04/11/2013 1118   CHOLHDL 5.7 04/11/2013 1118   VLDL 18 04/11/2013 1118   LDLCALC 113* 04/11/2013 1118       Assessment and plan:   1. Diabetes  - HgB A1c is 8.2% today, up from 5.9% in August 2014 - Glucose (CBG) Patient's claimed to have indulged in results of soda in the past few days to weeks  He declined the option of going up on metformin dose or short course of insulin He said "I know what caused her blood sugar to be up on him ready to cut down on my soda intake and drink water and then its going to come down"  He also declined referral to diabetic education classes  Patient has been counseled extensively about nutrition and exercise  2. Asymptomatic HIV infection Patient was encouraged to continue followup with infectious disease clinic  3. Dyslipidemia Patient to continue present medication  4. HTN (hypertension): Controlled Continue present medication DASH diet  Follow up in 3 months or when necessary   The patient was given clear instructions to go to ER or return to medical center if symptoms don't improve, worsen or new problems develop. The patient verbalized understanding. The patient was told to call to get lab results if they haven't heard anything in the next week.     Jeanann Lewandowsky, MD, MHA, [facility] [place], [place] [phone]   06/17/2013, 2:39 PM

## 2013-07-06 ENCOUNTER — Other Ambulatory Visit: Payer: Self-pay | Admitting: Infectious Diseases

## 2013-07-15 ENCOUNTER — Ambulatory Visit: Payer: Medicaid Other

## 2013-07-15 ENCOUNTER — Other Ambulatory Visit: Payer: Self-pay | Admitting: *Deleted

## 2013-07-15 DIAGNOSIS — B20 Human immunodeficiency virus [HIV] disease: Secondary | ICD-10-CM

## 2013-07-15 MED ORDER — RALTEGRAVIR POTASSIUM 400 MG PO TABS: 400.0000 mg | ORAL_TABLET | Freq: Two times a day (BID) | ORAL | Status: DC

## 2013-07-15 MED ORDER — EMTRICITABINE-TENOFOVIR DF 200-300 MG PO TABS: 1.0000 | ORAL_TABLET | Freq: Every day | ORAL | Status: DC

## 2013-07-31 ENCOUNTER — Other Ambulatory Visit (INDEPENDENT_AMBULATORY_CARE_PROVIDER_SITE_OTHER): Payer: No Typology Code available for payment source

## 2013-07-31 DIAGNOSIS — Z113 Encounter for screening for infections with a predominantly sexual mode of transmission: Secondary | ICD-10-CM

## 2013-07-31 DIAGNOSIS — B20 Human immunodeficiency virus [HIV] disease: Secondary | ICD-10-CM

## 2013-07-31 DIAGNOSIS — Z79899 Other long term (current) drug therapy: Secondary | ICD-10-CM

## 2013-07-31 LAB — CBC
HEMATOCRIT: 39.5 % (ref 39.0–52.0)
Hemoglobin: 13.7 g/dL (ref 13.0–17.0)
MCH: 30.4 pg (ref 26.0–34.0)
MCHC: 34.7 g/dL (ref 30.0–36.0)
MCV: 87.6 fL (ref 78.0–100.0)
Platelets: 184 10*3/uL (ref 150–400)
RBC: 4.51 MIL/uL (ref 4.22–5.81)
RDW: 12.9 % (ref 11.5–15.5)
WBC: 4.1 10*3/uL (ref 4.0–10.5)

## 2013-07-31 LAB — COMPLETE METABOLIC PANEL WITH GFR
ALBUMIN: 4 g/dL (ref 3.5–5.2)
ALT: 14 U/L (ref 0–53)
AST: 15 U/L (ref 0–37)
Alkaline Phosphatase: 99 U/L (ref 39–117)
BUN: 14 mg/dL (ref 6–23)
CALCIUM: 9.6 mg/dL (ref 8.4–10.5)
CHLORIDE: 102 meq/L (ref 96–112)
CO2: 22 meq/L (ref 19–32)
CREATININE: 0.96 mg/dL (ref 0.50–1.35)
GFR, Est African American: >89 mL/min
GFR, Est Non African American: >89 mL/min
Glucose, Bld: 313 mg/dL — ABNORMAL HIGH (ref 70–99)
POTASSIUM: 4.3 meq/L (ref 3.5–5.3)
Sodium: 133 mEq/L — ABNORMAL LOW (ref 135–145)
Total Bilirubin: 0.7 mg/dL (ref 0.2–1.2)
Total Protein: 7.7 g/dL (ref 6.0–8.3)

## 2013-07-31 LAB — LIPID PANEL
Cholesterol: 155 mg/dL (ref 0–200)
HDL: 38 mg/dL — ABNORMAL LOW (ref >39)
LDL CALC: 97 mg/dL (ref 0–99)
TRIGLYCERIDES: 99 mg/dL (ref <150)
Total CHOL/HDL Ratio: 4.1 Ratio
VLDL: 20 mg/dL (ref 0–40)

## 2013-08-01 LAB — HIV-1 RNA QUANT-NO REFLEX-BLD
HIV 1 RNA Quant: <20 copies/mL (ref <20)
HIV-1 RNA Quant, Log: <1.3 {Log} (ref <1.30)

## 2013-08-01 LAB — T-HELPER CELL (CD4) - (RCID CLINIC ONLY)
CD4 % Helper T Cell: 40 % (ref 33–55)
CD4 T CELL ABS: 670 /uL (ref 400–2700)

## 2013-08-01 LAB — RPR

## 2013-08-09 ENCOUNTER — Other Ambulatory Visit: Payer: Self-pay | Admitting: Internal Medicine

## 2013-08-14 ENCOUNTER — Ambulatory Visit: Payer: Self-pay | Admitting: Internal Medicine

## 2013-08-21 ENCOUNTER — Telehealth: Payer: Self-pay | Admitting: *Deleted

## 2013-08-21 ENCOUNTER — Encounter: Payer: Self-pay | Admitting: Internal Medicine

## 2013-08-21 ENCOUNTER — Ambulatory Visit (INDEPENDENT_AMBULATORY_CARE_PROVIDER_SITE_OTHER): Payer: No Typology Code available for payment source | Admitting: Internal Medicine

## 2013-08-21 VITALS — BP 139/75 | HR 90 | Temp 97.7°F | Ht 71.0 in | Wt 183.0 lb

## 2013-08-21 DIAGNOSIS — B20 Human immunodeficiency virus [HIV] disease: Secondary | ICD-10-CM

## 2013-08-21 DIAGNOSIS — J309 Allergic rhinitis, unspecified: Secondary | ICD-10-CM

## 2013-08-21 DIAGNOSIS — J302 Other seasonal allergic rhinitis: Secondary | ICD-10-CM

## 2013-08-21 MED ORDER — BECLOMETHASONE DIPROP MONOHYD 42 MCG/SPRAY NA SUSP: 2.0000 | Freq: Two times a day (BID) | NASAL | Status: DC

## 2013-08-21 MED ORDER — ELVITEG-COBIC-EMTRICIT-TENOFDF 150-150-200-300 MG PO TABS: 1.0000 | ORAL_TABLET | Freq: Every day | ORAL | Status: DC

## 2013-08-21 NOTE — Progress Notes (Signed)
Patient ID: Benjamin Hunter, male   DOB: Mar 23, 1961, 53 y.o.   MRN: [id number] @LOGODEPT         Patient Active Problem List   Diagnosis Date Noted  . HIV DISEASE 07/17/2008    Priority: High  . Seasonal allergies 09/20/2011    Priority: Medium  . Polyarthritis 09/06/2011    Priority: Medium  . DM 03/16/2009    Priority: Medium  . HYPERLIPIDEMIA 07/17/2008    Priority: Medium  . Hypertension 07/17/2008    Priority: Medium  . Diabetes 06/17/2013  . Asymptomatic HIV infection 06/17/2013  . Dyslipidemia 06/17/2013  . HTN (hypertension) 06/17/2013  . Depression 01/14/2013  . Rash and nonspecific skin eruption 10/11/2012  . Recurrent low back pain 10/11/2012  . Left facial numbness 08/20/2012    Patient's Medications  New Prescriptions   BECLOMETHASONE (BECONASE AQ) 42 MCG/SPRAY NASAL SPRAY    Place 2 sprays into both nostrils 2 (two) times daily. Dose is for each nostril.   ELVITEGRAVIR-COBICISTAT-EMTRICITABINE-TENOFOVIR (STRIBILD) 150-150-200-300 MG TABS TABLET    Take 1 tablet by mouth daily with breakfast.  Previous Medications   AMLODIPINE (NORVASC) 5 MG TABLET    TAKE 1 TABLET BY MOUTH ONCE DAILY   ASPIRIN 81 MG EC TABLET    Take 1 tablet (81 mg total) by mouth daily. Swallow whole.   ATORVASTATIN (LIPITOR) 40 MG TABLET    Take 40 mg by mouth daily.   BLOOD GLUCOSE MONITORING SUPPL (WAVESENSE PRESTO PRO METER) DEVI    by Does not apply route. 1 meter     GLIPIZIDE (GLUCOTROL) 10 MG TABLET    Take 1 tablet (10 mg total) by mouth daily.   GLUCOSE BLOOD (WAVESENSE PRESTO TEST) TEST STRIP    Use as directed   METFORMIN (GLUCOPHAGE) 500 MG TABLET    Take 1 tablet (500 mg total) by mouth 2 (two) times daily with a meal.  Modified Medications   No medications on file  Discontinued Medications   EMTRICITABINE-TENOFOVIR (TRUVADA) 200-300 MG PER TABLET    Take 1 tablet by mouth daily.   FLUTICASONE (FLONASE) 50 MCG/ACT NASAL SPRAY    Place 2 sprays into the nose daily as needed  for rhinitis or allergies.   ISENTRESS 400 MG TABLET    TAKE 1 TABLET BY MOUTH TWICE DAILY   RALTEGRAVIR (ISENTRESS) 400 MG TABLET    Take 1 tablet (400 mg total) by mouth 2 (two) times daily.   TRUVADA 200-300 MG PER TABLET    TAKE 1 TABLET BY MOUTH DAILY    Subjective: Benjamin Hunter is in for his routine visit. As usual, he has not missed any doses of his HIV medications since his last visit. He has been struggling to control his blood sugars. He is still drinking sodas on a regular basis, usually Pepsi. He states that he does not know what to substitute for the sodas. His blood sugars have been running around 350 daily. He has been trying to lose weight to gain better control of his blood sugar. He gets very little exercise.  Review of Systems: Pertinent items are noted in HPI.  Past Medical History  Diagnosis Date  . Diabetes mellitus   . HIV (human immunodeficiency virus infection)   . Hyperlipidemia   . Hypertension   . Arthritis   . Depression     History  Substance Use Topics  . Smoking status: Never Smoker   . Smokeless tobacco: Never Used  . Alcohol Use: 0.5 oz/week    1  drink(s) per week     Comment: 1 can beer a week    Family History  Problem Relation Age of Onset  . Hypertension Mother     Allergies  Allergen Reactions  . Lisinopril     Palmar rash     Objective: Temp: 97.7 F (36.5 C) (03/25 1533) Temp src: Oral (03/25 1533) BP: 139/75 mmHg (03/25 1533) Pulse Rate: 90 (03/25 1533) Body mass index is 25.53 kg/(m^2).  General: His weight is down 12 pounds to 183 Oral: No oropharyngeal lesions Skin: No rash Lungs: Clear Cor: Regular S1 and S2 no murmurs Abdomen: Soft and nontender Joints and extremities: No acute abnormalities Neuro: Alert with normal speech and conversation Mood and affect: Normal  Lab Results Lab Results  Component Value Date   WBC 4.1 07/31/2013   HGB 13.7 07/31/2013   HCT 39.5 07/31/2013   MCV 87.6 07/31/2013   PLT 184 07/31/2013     Lab Results  Component Value Date   CREATININE 0.96 07/31/2013   BUN 14 07/31/2013   NA 133* 07/31/2013   K 4.3 07/31/2013   CL 102 07/31/2013   CO2 22 07/31/2013    Lab Results  Component Value Date   ALT 14 07/31/2013   AST 15 07/31/2013   ALKPHOS 99 07/31/2013   BILITOT 0.7 07/31/2013    Lab Results  Component Value Date   CHOL 155 07/31/2013   HDL 38* 07/31/2013   LDLCALC 97 07/31/2013   TRIG 99 07/31/2013   CHOLHDL 4.1 07/31/2013    Lab Results HIV 1 RNA Quant (copies/mL)  Date Value  07/31/2013 <20   04/11/2013 <20   12/31/2012 <20      CD4 T Cell Abs (/uL)  Date Value  07/31/2013 670   04/11/2013 590   12/31/2012 550      Assessment: His HIV infection remains under excellent control. I talked to him at length about options for simplification of his regimen we'll switch him to once daily Stribild.  He continues to use Flonase for her seasonal allergies. The cause of the drug drug interaction between cobicistat in Stribild and his Flonase I will change him to [facility] or another similar product that he can afford.  It is very unlikely that his HIV infection we'll cover cause him any significant harm. I talked to him at length about the importance of better glycemic control and lifestyle modification. I will make a referral for nutrition counseling.  Plan: 1. Change antiretroviral regimen to Stribild once daily 2. Change Flonase to alternate products but does not interact with cobicistat 3. Nutrition referral 4. Counseled about lifestyle modification 5. Follow up after lab work in 3 months   [name], MD [facility] [phone] pager   [phone] cell 08/21/2013, 5:03 PM

## 2013-08-21 NOTE — Telephone Encounter (Signed)
Beconase AQ not covered by [employer], over $200.  Pt wanting [facility] and himself to be called with a new order.

## 2013-08-27 ENCOUNTER — Telehealth: Payer: Self-pay | Admitting: *Deleted

## 2013-08-27 NOTE — Telephone Encounter (Signed)
Patient called and advised [facility]. He advised he would like it changed to Flonase if possible. Advised him no response to the message sent be Angelique BlonderDenise but will have her call him once there is.

## 2013-08-27 NOTE — Telephone Encounter (Signed)
Left message for pt to let him know that we would contact him about approval.

## 2013-09-02 ENCOUNTER — Ambulatory Visit: Payer: No Typology Code available for payment source

## 2013-09-03 NOTE — Telephone Encounter (Signed)
Rinaldo CloudP. Jones is working on a Loss adjuster, charteredatient Assistance Program for the [employer].

## 2013-09-04 ENCOUNTER — Other Ambulatory Visit: Payer: Self-pay | Admitting: *Deleted

## 2013-09-04 ENCOUNTER — Telehealth: Payer: Self-pay | Admitting: *Deleted

## 2013-09-04 DIAGNOSIS — J302 Other seasonal allergic rhinitis: Secondary | ICD-10-CM

## 2013-09-04 MED ORDER — BECLOMETHASONE DIPROP MONOHYD 42 MCG/SPRAY NA SUSP: 2.0000 | Freq: Two times a day (BID) | NASAL | Status: DC

## 2013-09-04 NOTE — Telephone Encounter (Signed)
Faxed application to [facility] today for Beconase AQ.

## 2013-09-16 ENCOUNTER — Telehealth: Payer: Self-pay | Admitting: *Deleted

## 2013-09-16 NOTE — Telephone Encounter (Signed)
[place] [facility] [employer].  Mr. Benjamin Hunter has been approved for his Beconase.  Will let the patient know.

## 2013-09-16 NOTE — Telephone Encounter (Signed)
Pt received Beconase in the mail today.

## 2013-09-20 ENCOUNTER — Ambulatory Visit: Payer: No Typology Code available for payment source | Attending: Internal Medicine | Admitting: Internal Medicine

## 2013-09-20 ENCOUNTER — Encounter: Payer: Self-pay | Admitting: Internal Medicine

## 2013-09-20 VITALS — BP 127/79 | HR 83 | Temp 98.6°F | Resp 14

## 2013-09-20 DIAGNOSIS — Z09 Encounter for follow-up examination after completed treatment for conditions other than malignant neoplasm: Secondary | ICD-10-CM | POA: Insufficient documentation

## 2013-09-20 DIAGNOSIS — E1165 Type 2 diabetes mellitus with hyperglycemia: Secondary | ICD-10-CM

## 2013-09-20 DIAGNOSIS — I10 Essential (primary) hypertension: Secondary | ICD-10-CM

## 2013-09-20 DIAGNOSIS — E785 Hyperlipidemia, unspecified: Secondary | ICD-10-CM

## 2013-09-20 DIAGNOSIS — Z21 Asymptomatic human immunodeficiency virus [HIV] infection status: Secondary | ICD-10-CM

## 2013-09-20 DIAGNOSIS — E119 Type 2 diabetes mellitus without complications: Secondary | ICD-10-CM

## 2013-09-20 DIAGNOSIS — IMO0001 Reserved for inherently not codable concepts without codable children: Secondary | ICD-10-CM | POA: Insufficient documentation

## 2013-09-20 LAB — GLUCOSE, POCT (MANUAL RESULT ENTRY): POC GLUCOSE: 373 mg/dL — AB (ref 70–99)

## 2013-09-20 LAB — POCT GLYCOSYLATED HEMOGLOBIN (HGB A1C): Hemoglobin A1C: 9.1

## 2013-09-20 MED ORDER — INSULIN ASPART 100 UNIT/ML ~~LOC~~ SOLN
15.0000 [IU] | Freq: Once | SUBCUTANEOUS | Status: AC
Start: 2013-09-20 — End: 2013-09-20
  Administered 2013-09-20: 15 [IU] via SUBCUTANEOUS

## 2013-09-20 MED ORDER — METFORMIN HCL 1000 MG PO TABS: 1000.0000 mg | ORAL_TABLET | Freq: Two times a day (BID) | ORAL | Status: DC

## 2013-09-20 NOTE — Progress Notes (Signed)
Patient here for follow up on his diabetes  Blood sugar in office is 373 Per dr Orpah CobbAdvani gave 15 units of novolog

## 2013-09-20 NOTE — Patient Instructions (Signed)

## 2013-09-20 NOTE — Progress Notes (Signed)
MRN: [id number] Name: Benjamin Hunter  Sex: male Age: 52 y.o. DOB: 1961-03-08  Allergies: Lisinopril  Chief Complaint  Patient presents with  . Follow-up    HPI: Patient is 53 y.o. male who has to of diabetes hypertension hyperlipidemia HIV following up with his infectious disease Dr., comes today for followup today's blood sugar is elevated as per patient he ate  1-1/2 hours ago, denies any headache dizziness chest and shortness of breath, previously patient has been reluctant to increase the medication dosage, his A1c is trending up today it is 9.1%, I have counseled patient regarding diet modification as well as increasing the dosage of metformin, patient understands and is agreeable to the plan.  Past Medical History  Diagnosis Date  . Diabetes mellitus   . HIV (human immunodeficiency virus infection)   . Hyperlipidemia   . Hypertension   . Arthritis   . Depression     History reviewed. No pertinent past surgical history.    Medication List       This list is accurate as of: 09/20/13  3:04 PM.  Always use your most recent med list.               amLODipine 5 MG tablet  Commonly known as:  NORVASC  TAKE 1 TABLET BY MOUTH ONCE DAILY     aspirin 81 MG EC tablet  Take 1 tablet (81 mg total) by mouth daily. Swallow whole.     atorvastatin 40 MG tablet  Commonly known as:  LIPITOR  Take 40 mg by mouth daily.     beclomethasone 42 MCG/SPRAY nasal spray  Commonly known as:  BECONASE AQ  Place 2 sprays into both nostrils 2 (two) times daily. Dose is for each nostril.     elvitegravir-cobicistat-emtricitabine-tenofovir 150-150-200-300 MG Tabs tablet  Commonly known as:  STRIBILD  Take 1 tablet by mouth daily with breakfast.     glipiZIDE 10 MG tablet  Commonly known as:  GLUCOTROL  Take 1 tablet (10 mg total) by mouth daily.     glucose blood test strip  Commonly known as:  WAVESENSE PRESTO TEST  Use as directed     metFORMIN 1000 MG tablet  Commonly  known as:  GLUCOPHAGE  Take 1 tablet (1,000 mg total) by mouth 2 (two) times daily with a meal.     WAVESENSE PRESTO PRO METER Devi  - by Does not apply route. 1 meter  -         Meds ordered this encounter  Medications  . metFORMIN (GLUCOPHAGE) 1000 MG tablet    Sig: Take 1 tablet (1,000 mg total) by mouth 2 (two) times daily with a meal.    Dispense:  60 tablet    Refill:  11  . insulin aspart (novoLOG) injection 15 Units    Sig:     Per Dr Benjamin Hunter    Immunization History  Administered Date(s) Administered  . Hepatitis B 08/22/2002, 10/13/2002, 03/17/2003  . Influenza Split 04/25/2011, 02/16/2012  . Influenza Whole 03/06/2008, 03/16/2009, 01/25/2010  . Influenza,inj,Quad PF,36+ Mos 05/02/2013  . Pneumococcal Polysaccharide-23 11/26/2007, 10/11/2012    Family History  Problem Relation Age of Onset  . Hypertension Mother     History  Substance Use Topics  . Smoking status: Never Smoker   . Smokeless tobacco: Never Used  . Alcohol Use: 0.5 oz/week    1 drink(s) per week     Comment: 1 can beer a week    Review of  Systems   As noted in HPI  Filed Vitals:   09/20/13 1437  BP: 127/79  Pulse: 83  Temp: 98.6 F (37 C)  Resp: 14    Physical Exam  Physical Exam  Constitutional: No distress.  Eyes: EOM are normal. Pupils are equal, round, and reactive to light.  Cardiovascular: Normal rate and regular rhythm.   Pulmonary/Chest: Breath sounds normal. No respiratory distress. He has no wheezes. He has no rales.  Musculoskeletal: He exhibits no edema.    CBC    Component Value Date/Time   WBC 4.1 07/31/2013 1035   RBC 4.51 07/31/2013 1035   HGB 13.7 07/31/2013 1035   HCT 39.5 07/31/2013 1035   PLT 184 07/31/2013 1035   MCV 87.6 07/31/2013 1035   LYMPHSABS 1.2 08/10/2012 1037   MONOABS 0.2 08/10/2012 1037   EOSABS 0.2 08/10/2012 1037   BASOSABS 0.0 08/10/2012 1037    CMP     Component Value Date/Time   NA 133* 07/31/2013 1035   K 4.3 07/31/2013 1035   CL 102  07/31/2013 1035   CO2 22 07/31/2013 1035   GLUCOSE 313* 07/31/2013 1035   BUN 14 07/31/2013 1035   CREATININE 0.96 07/31/2013 1035   CREATININE 1.08 08/18/2012 1502   CALCIUM 9.6 07/31/2013 1035   PROT 7.7 07/31/2013 1035   ALBUMIN 4.0 07/31/2013 1035   AST 15 07/31/2013 1035   ALT 14 07/31/2013 1035   ALKPHOS 99 07/31/2013 1035   BILITOT 0.7 07/31/2013 1035   GFRNONAA >89 07/31/2013 1035   GFRNONAA 77* 08/18/2012 1502   GFRAA >89 07/31/2013 1035   GFRAA 89* 08/18/2012 1502    Lab Results  Component Value Date/Time   CHOL 155 07/31/2013 10:35 AM    No components found with this basename: hga1c    Lab Results  Component Value Date/Time   AST 15 07/31/2013 10:35 AM    Assessment and Plan  DM (diabetes mellitus) uncontrolled - Plan:  Results for orders placed in visit on 09/20/13  GLUCOSE, POCT (MANUAL RESULT ENTRY)      Result Value Ref Range   POC Glucose 373 (*) 70 - 99 mg/dl  POCT GLYCOSYLATED HEMOGLOBIN (HGB A1C)      Result Value Ref Range   Hemoglobin A1C 9.1     patient  got 15 units of NovoLog in office, I have advised patient to check his blood sugar at home, I have increased the dose of metformin to 1 g twice a day continue with Glucotrol 10 mg daily, keep the fingerstick log Will repeat A1c on next visit.  Glucose (CBG), HgB A1c, metFORMIN (GLUCOPHAGE) 1000 MG tablet, insulin aspart (novoLOG) injection 15 Units  HTN (hypertension) Well controlled continue with amlodipine. DASH diet.  Dyslipidemia Repeat fasting lipid panel on next visit.  Asymptomatic HIV infection Following up with infectious disease and is on stribild.   Return in about 3 months (around 12/20/2013) for diabetes, hypertension, hyperipidemia.  Doris Cheadle, MD

## 2013-11-08 ENCOUNTER — Ambulatory Visit: Payer: No Typology Code available for payment source | Attending: Internal Medicine

## 2013-11-20 ENCOUNTER — Encounter: Payer: Self-pay | Admitting: *Deleted

## 2013-11-20 ENCOUNTER — Other Ambulatory Visit (INDEPENDENT_AMBULATORY_CARE_PROVIDER_SITE_OTHER): Payer: No Typology Code available for payment source

## 2013-11-20 DIAGNOSIS — B20 Human immunodeficiency virus [HIV] disease: Secondary | ICD-10-CM

## 2013-11-20 LAB — COMPLETE METABOLIC PANEL WITH GFR
ALT: 13 U/L (ref 0–53)
AST: 14 U/L (ref 0–37)
Albumin: 4.4 g/dL (ref 3.5–5.2)
Alkaline Phosphatase: 83 U/L (ref 39–117)
BILIRUBIN TOTAL: 0.6 mg/dL (ref 0.2–1.2)
BUN: 17 mg/dL (ref 6–23)
CO2: 19 meq/L (ref 19–32)
CREATININE: 1.14 mg/dL (ref 0.50–1.35)
Calcium: 9.7 mg/dL (ref 8.4–10.5)
Chloride: 105 mEq/L (ref 96–112)
GFR, EST AFRICAN AMERICAN: 84 mL/min
GFR, EST NON AFRICAN AMERICAN: 73 mL/min
Glucose, Bld: 299 mg/dL — ABNORMAL HIGH (ref 70–99)
Potassium: 4 mEq/L (ref 3.5–5.3)
Sodium: 134 mEq/L — ABNORMAL LOW (ref 135–145)
Total Protein: 7.9 g/dL (ref 6.0–8.3)

## 2013-11-20 NOTE — Patient Instructions (Signed)
Use warm wet compresses to eye as often as necessary.  Call office back on Monday if the eye had not cleared.

## 2013-11-20 NOTE — Progress Notes (Signed)
Patient ID: Benjamin NearingKenneth R Hunter, male   DOB: 08/26/1960, 53 y.o.   MRN: [id number] Walk-in to Clinic - "stye" in left eye, outer corner x several days.  Wondering whether he needs to be seen.  Has been using warm compresses to the eye.  No pain.  No appointments available until next week.  RN spoke with Dr. Orvan Falconerampbell who printed Up-To-Date information on [employer].  Highlighted the use of warm compresses to the eye.  Also recommended that the pt call back next Monday if the eye does not improve.  RN spoke with the pt and reviewed the information.  Pt verbalized back the information.  Pt shared that he received his Social Security Disability recently.

## 2013-11-21 LAB — HIV-1 RNA QUANT-NO REFLEX-BLD: HIV 1 RNA Quant: <20 copies/mL (ref <20)

## 2013-11-21 LAB — T-HELPER CELL (CD4) - (RCID CLINIC ONLY)
CD4 % Helper T Cell: 37 % (ref 33–55)
CD4 T CELL ABS: 1230 /uL (ref 400–2700)

## 2013-11-27 ENCOUNTER — Encounter (HOSPITAL_COMMUNITY): Payer: Self-pay | Admitting: Emergency Medicine

## 2013-11-27 ENCOUNTER — Telehealth: Payer: Self-pay | Admitting: *Deleted

## 2013-11-27 ENCOUNTER — Emergency Department (HOSPITAL_COMMUNITY)
Admission: EM | Admit: 2013-11-27 | Discharge: 2013-11-27 | Disposition: A | Discharge status: Home / Self Care | Payer: No Typology Code available for payment source | Source: Home / Self Care

## 2013-11-27 DIAGNOSIS — R110 Nausea: Secondary | ICD-10-CM

## 2013-11-27 DIAGNOSIS — L258 Unspecified contact dermatitis due to other agents: Secondary | ICD-10-CM

## 2013-11-27 DIAGNOSIS — L252 Unspecified contact dermatitis due to dyes: Secondary | ICD-10-CM

## 2013-11-27 MED ORDER — TRIAMCINOLONE ACETONIDE 0.1 % EX CREA: TOPICAL_CREAM | CUTANEOUS | Status: DC

## 2013-11-27 MED ORDER — ONDANSETRON HCL 4 MG PO TABS: ORAL_TABLET | ORAL | Status: DC

## 2013-11-27 NOTE — ED Provider Notes (Signed)
CSN: [id number]     Arrival date & time 11/27/13  [id number] History   First MD Initiated Contact with Patient 11/27/13 1036     Chief Complaint  Patient presents with  . Rash   (Consider location/radiation/quality/duration/timing/severity/associated sxs/prior Treatment) HPI Comments: As above, 48 h ago placed a dying chemical to eyebrows. Yesterday with swelling of upper lids and mid forehead. Itchy. Not affecting eye proper.  In addition feeling a little nausea and dizzy.   Past Medical History  Diagnosis Date  . Diabetes mellitus   . HIV (human immunodeficiency virus infection)   . Hyperlipidemia   . Hypertension   . Arthritis   . Depression    History reviewed. No pertinent past surgical history. Family History  Problem Relation Age of Onset  . Hypertension Mother    History  Substance Use Topics  . Smoking status: Never Smoker   . Smokeless tobacco: Never Used  . Alcohol Use: 0.5 oz/week    1 drink(s) per week     Comment: 1 can beer a week    Review of Systems  Constitutional: Negative.   HENT: Negative.   Respiratory: Negative.   Cardiovascular: Negative.   Gastrointestinal: Positive for nausea. Negative for vomiting.  Musculoskeletal: Negative.   Skin: Positive for rash.  Neurological: Positive for dizziness. Negative for tremors, syncope, speech difficulty and headaches.    Allergies  Lisinopril  Home Medications   Prior to Admission medications   Medication Sig Start Date End Date Taking? Authorizing Provider  amLODipine (NORVASC) 5 MG tablet TAKE 1 TABLET BY MOUTH ONCE DAILY 06/07/13   Cliffton AstersJohn Campbell, MD  aspirin 81 MG EC tablet Take 1 tablet (81 mg total) by mouth daily. Swallow whole. 09/20/12   Cliffton AstersJohn Campbell, MD  atorvastatin (LIPITOR) 40 MG tablet Take 40 mg by mouth daily. 07/31/12   Cliffton AstersJohn Campbell, MD  beclomethasone (BECONASE AQ) 42 MCG/SPRAY nasal spray Place 2 sprays into both nostrils 2 (two) times daily. Dose is for each nostril. 09/04/13   Randall Hissornelius N  Van Dam, MD  Blood Glucose Monitoring Suppl (WAVESENSE PRESTO PRO METER) DEVI by Does not apply route. 1 meter      Historical Provider, MD  elvitegravir-cobicistat-emtricitabine-tenofovir (STRIBILD) 150-150-200-300 MG TABS tablet Take 1 tablet by mouth daily with breakfast. 08/21/13   Cliffton AstersJohn Campbell, MD  glipiZIDE (GLUCOTROL) 10 MG tablet Take 1 tablet (10 mg total) by mouth daily. 05/14/13   Cliffton AstersJohn Campbell, MD  glucose blood (WAVESENSE PRESTO TEST) test strip Use as directed 08/27/12   Cliffton AstersJohn Campbell, MD  metFORMIN (GLUCOPHAGE) 1000 MG tablet Take 1 tablet (1,000 mg total) by mouth 2 (two) times daily with a meal. 09/20/13   Doris Cheadleeepak Advani, MD  ondansetron (ZOFRAN) 4 MG tablet One po q 6h prn nausea 11/27/13   Hayden Rasmussenavid Ysmael Hires, NP  triamcinolone cream (KENALOG) 0.1 % Apply to affected area 2 to 3 times a day as directed. 11/27/13   Hayden Rasmussenavid Iaan Oregel, NP   BP 173/80  Pulse 89  Temp(Src) 98.6 F (37 C) (Oral)  Resp 18  SpO2 100% Physical Exam  Nursing note and vitals reviewed. Constitutional: He is oriented to person, place, and time. He appears well-developed and well-nourished. No distress.  HENT:  Head: Normocephalic and atraumatic.  Mouth/Throat: Oropharynx is clear and moist. No oropharyngeal exudate.  Light swelling, puffiness to mid forehead in a vertical pattern approx 3 cm wide. Nontender.  Eyes: Conjunctivae and EOM are normal. Pupils are equal, round, and reactive to light. Right eye exhibits no  discharge. Left eye exhibits no discharge.  Mild swelling with miniscule papules to both upper eye lids.  Neck: Normal range of motion. Neck supple.  Cardiovascular: Normal rate and regular rhythm.   Pulmonary/Chest: Effort normal. No respiratory distress.  Neurological: He is alert and oriented to person, place, and time. He exhibits normal muscle tone.  Skin: Skin is warm and dry.  Minimal skin changes. Swelling as described above.   Psychiatric: He has a normal mood and affect.    ED Course   Procedures (including critical care time) Labs Review Labs Reviewed - No data to display  Imaging Review No results found.   MDM   1. Contact dermatitis due to dye   2. Nausea     Stay in cool environment, plenty of water zofran for nausea Triamcinolone cream as dir.    Hayden Rasmussenavid Toree Edling, NP 11/27/13 1055

## 2013-11-27 NOTE — ED Notes (Signed)
Pt  Reports    An  Irritated  Area   Around  Qwest CommunicationsEyebrow  And  Forehead  After dying  His  Eyebrows         sev  Days  Ago  Symptoms  Worse  yest        Pt  States  He  Washed  The  Dye  Off  His  Eyebrows   No  Facial  Edema  Or  Any resp  distress

## 2013-11-27 NOTE — ED Provider Notes (Signed)
Medical screening examination/treatment/procedure(s) were performed by non-physician practitioner and as supervising physician I was immediately available for consultation/collaboration.  Arletta Lumadue, M.D.  Clarita Mcelvain C Wendall Isabell, MD 11/27/13 1111 

## 2013-11-27 NOTE — Telephone Encounter (Signed)
Pt called stating that he colored his eyebrows last night and now has swelling around his eyes and forehead.  No available appointments today, RN advised him to contact his PCP or urgent care to be seen today. Andree CossHowell, Garnell Phenix M, RN

## 2013-11-27 NOTE — Discharge Instructions (Signed)
Contact Dermatitis °Contact dermatitis is a reaction to certain substances that touch the skin. Contact dermatitis can be either irritant contact dermatitis or allergic contact dermatitis. Irritant contact dermatitis does not require previous exposure to the substance for a reaction to occur. Allergic contact dermatitis only occurs if you have been exposed to the substance before. Upon a repeat exposure, your body reacts to the substance.  °CAUSES  °Many substances can cause contact dermatitis. Irritant dermatitis is most commonly caused by repeated exposure to mildly irritating substances, such as: °· Makeup. °· Soaps. °· Detergents. °· Bleaches. °· Acids. °· Metal salts, such as nickel. °Allergic contact dermatitis is most commonly caused by exposure to: °· Poisonous plants. °· Chemicals (deodorants, shampoos). °· Jewelry. °· Latex. °· Neomycin in triple antibiotic cream. °· Preservatives in products, including clothing. °SYMPTOMS  °The area of skin that is exposed may develop: °· Dryness or flaking. °· Redness. °· Cracks. °· Itching. °· Pain or a burning sensation. °· Blisters. °With allergic contact dermatitis, there may also be swelling in areas such as the eyelids, mouth, or genitals.  °DIAGNOSIS  °Your caregiver can usually tell what the problem is by doing a physical exam. In cases where the cause is uncertain and an allergic contact dermatitis is suspected, a patch skin test may be performed to help determine the cause of your dermatitis. °TREATMENT °Treatment includes protecting the skin from further contact with the irritating substance by avoiding that substance if possible. Barrier creams, powders, and gloves may be helpful. Your caregiver may also recommend: °· Steroid creams or ointments applied 2 times daily. For best results, soak the rash area in cool water for 20 minutes. Then apply the medicine. Cover the area with a plastic wrap. You can store the steroid cream in the refrigerator for a "chilly"  effect on your rash. That may decrease itching. Oral steroid medicines may be needed in more severe cases. °· Antibiotics or antibacterial ointments if a skin infection is present. °· Antihistamine lotion or an antihistamine taken by mouth to ease itching. °· Lubricants to keep moisture in your skin. °· Burow's solution to reduce redness and soreness or to dry a weeping rash. Mix one packet or tablet of solution in 2 cups cool water. Dip a clean washcloth in the mixture, wring it out a bit, and put it on the affected area. Leave the cloth in place for 30 minutes. Do this as often as possible throughout the day. °· Taking several cornstarch or baking soda baths daily if the area is too large to cover with a washcloth. °Harsh chemicals, such as alkalis or acids, can cause skin damage that is like a burn. You should flush your skin for 15 to 20 minutes with cold water after such an exposure. You should also seek immediate medical care after exposure. Bandages (dressings), antibiotics, and pain medicine may be needed for severely irritated skin.  °HOME CARE INSTRUCTIONS °· Avoid the substance that caused your reaction. °· Keep the area of skin that is affected away from hot water, soap, sunlight, chemicals, acidic substances, or anything else that would irritate your skin. °· Do not scratch the rash. Scratching may cause the rash to become infected. °· You may take cool baths to help stop the itching. °· Only take over-the-counter or prescription medicines as directed by your caregiver. °· See your caregiver for follow-up care as directed to make sure your skin is healing properly. °SEEK MEDICAL CARE IF:  °· Your condition is not better after 3   days of treatment.  You seem to be getting worse.  You see signs of infection such as swelling, tenderness, redness, soreness, or warmth in the affected area.  You have any problems related to your medicines. Document Released: 05/13/2000 Document Revised: 08/08/2011  Document Reviewed: 10/19/2010 [facility] Patient Information 2015 [place], [place]. This information is not intended to replace advice given to you by your health care provider. Make sure you discuss any questions you have with your health care provider.  Nausea, Adult Nausea means you feel sick to your stomach or need to throw up (vomit). It may be a sign of a more serious problem. If nausea gets worse, you may throw up. If you throw up a lot, you may lose too much body fluid (dehydration). HOME CARE   Get plenty of rest.  Ask your doctor how to replace body fluid losses (rehydrate).  Eat small amounts of food. Sip liquids more often.  Take all medicines as told by your doctor. GET HELP RIGHT AWAY IF:  You have a fever.  You pass out (faint).  You keep throwing up or have blood in your throw up.  You are very weak, have dry lips or a dry mouth, or you are very thirsty (dehydrated).  You have dark or bloody poop (stool).  You have very bad chest or belly (abdominal) pain.  You do not get better after 2 days, or you get worse.  You have a headache. MAKE SURE YOU:  Understand these instructions.  Will watch your condition.  Will get help right away if you are not doing well or get worse. Document Released: 05/05/2011 Document Revised: 08/08/2011 Document Reviewed: 05/05/2011 [facility] Patient Information 2015 [place], [place]. This information is not intended to replace advice given to you by your health care provider. Make sure you discuss any questions you have with your health care provider.

## 2013-12-03 ENCOUNTER — Ambulatory Visit: Payer: Self-pay

## 2013-12-03 ENCOUNTER — Ambulatory Visit (INDEPENDENT_AMBULATORY_CARE_PROVIDER_SITE_OTHER): Payer: Self-pay | Admitting: Internal Medicine

## 2013-12-03 VITALS — BP 128/88 | HR 76 | Temp 98.1°F | Wt 183.5 lb

## 2013-12-03 DIAGNOSIS — B20 Human immunodeficiency virus [HIV] disease: Secondary | ICD-10-CM

## 2013-12-03 NOTE — Progress Notes (Signed)
Patient ID: Benjamin Hunter, male   DOB: Jan 30, 1961, 53 y.o.   MRN: [id number]          Patient Active Problem List   Diagnosis Date Noted  . HIV DISEASE 07/17/2008    Priority: High  . Seasonal allergies 09/20/2011    Priority: Medium  . Polyarthritis 09/06/2011    Priority: Medium  . DM 03/16/2009    Priority: Medium  . HYPERLIPIDEMIA 07/17/2008    Priority: Medium  . Hypertension 07/17/2008    Priority: Medium  . Diabetes 06/17/2013  . Asymptomatic HIV infection 06/17/2013  . Dyslipidemia 06/17/2013  . HTN (hypertension) 06/17/2013  . Depression 01/14/2013  . Rash and nonspecific skin eruption 10/11/2012  . Recurrent low back pain 10/11/2012  . Left facial numbness 08/20/2012    Patient's Medications  New Prescriptions   No medications on file  Previous Medications   AMLODIPINE (NORVASC) 5 MG TABLET    TAKE 1 TABLET BY MOUTH ONCE DAILY   ASPIRIN 81 MG EC TABLET    Take 1 tablet (81 mg total) by mouth daily. Swallow whole.   ATORVASTATIN (LIPITOR) 40 MG TABLET    Take 40 mg by mouth daily.   BECLOMETHASONE (BECONASE AQ) 42 MCG/SPRAY NASAL SPRAY    Place 2 sprays into both nostrils 2 (two) times daily. Dose is for each nostril.   BLOOD GLUCOSE MONITORING SUPPL (WAVESENSE PRESTO PRO METER) DEVI    by Does not apply route. 1 meter     ELVITEGRAVIR-COBICISTAT-EMTRICITABINE-TENOFOVIR (STRIBILD) 150-150-200-300 MG TABS TABLET    Take 1 tablet by mouth daily with breakfast.   GLIPIZIDE (GLUCOTROL) 10 MG TABLET    Take 1 tablet (10 mg total) by mouth daily.   GLUCOSE BLOOD (WAVESENSE PRESTO TEST) TEST STRIP    Use as directed   METFORMIN (GLUCOPHAGE) 1000 MG TABLET    Take 1 tablet (1,000 mg total) by mouth 2 (two) times daily with a meal.   ONDANSETRON (ZOFRAN) 4 MG TABLET    One po q 6h prn nausea   TRIAMCINOLONE CREAM (KENALOG) 0.1 %    Apply to affected area 2 to 3 times a day as directed.  Modified Medications   No medications on file  Discontinued Medications   No  medications on file    Subjective: Touger is in for his routine visit. Has not missed any doses of his Stribild since his last visit. His mother recently had several falls at home. She was hospitalized and is now in a skilled nursing facility undergoing physical therapy. He states that has caused his life to be a little stressful and he has not been able to do everything he needs to do to get his blood sugars under better control. Review of Systems: Pertinent items are noted in HPI.  Past Medical History  Diagnosis Date  . Diabetes mellitus   . HIV (human immunodeficiency virus infection)   . Hyperlipidemia   . Hypertension   . Arthritis   . Depression     History  Substance Use Topics  . Smoking status: Never Smoker   . Smokeless tobacco: Never Used  . Alcohol Use: 0.5 oz/week    1 drink(s) per week     Comment: 1 can beer a week    Family History  Problem Relation Age of Onset  . Hypertension Mother     Allergies  Allergen Reactions  . Lisinopril     Palmar rash     Objective: Temp: 98.1 F (36.7 C) (07/07  1017) Temp src: Oral (07/07 1017) BP: 128/88 mmHg (07/07 1017) Pulse Rate: 76 (07/07 1017) Body mass index is 25.6 kg/(m^2).  General: He is in good spirits Oral: No oropharyngeal lesions Skin: No rash Lungs: Clear Cor: Regular S1 and S2 with no murmurs   Lab Results Lab Results  Component Value Date   WBC 4.1 07/31/2013   HGB 13.7 07/31/2013   HCT 39.5 07/31/2013   MCV 87.6 07/31/2013   PLT 184 07/31/2013    Lab Results  Component Value Date   CREATININE 1.14 11/20/2013   BUN 17 11/20/2013   NA 134* 11/20/2013   K 4.0 11/20/2013   CL 105 11/20/2013   CO2 19 11/20/2013    Lab Results  Component Value Date   ALT 13 11/20/2013   AST 14 11/20/2013   ALKPHOS 83 11/20/2013   BILITOT 0.6 11/20/2013    Lab Results  Component Value Date   CHOL 155 07/31/2013   HDL 38* 07/31/2013   LDLCALC 97 07/31/2013   TRIG 99 07/31/2013   CHOLHDL 4.1 07/31/2013    Lab  Results HIV 1 RNA Quant (copies/mL)  Date Value  11/20/2013 <20   07/31/2013 <20   04/11/2013 <20      CD4 T Cell Abs (/uL)  Date Value  11/20/2013 1230   07/31/2013 670   04/11/2013 590      Assessment: His HIV infection remains under excellent control.  Plan: 1. Continue Stribild 2. Followup after blood work in 6 months   [name], MD [facility] [phone] pager   [phone] cell 12/03/2013, 10:31 AM

## 2013-12-08 ENCOUNTER — Encounter (HOSPITAL_COMMUNITY): Payer: Self-pay | Admitting: Emergency Medicine

## 2013-12-08 ENCOUNTER — Emergency Department (INDEPENDENT_AMBULATORY_CARE_PROVIDER_SITE_OTHER)
Admission: EM | Admit: 2013-12-08 | Discharge: 2013-12-08 | Disposition: A | Discharge status: Nursing Home (Includes ICF) | Payer: No Typology Code available for payment source | Source: Home / Self Care | Attending: Family Medicine | Admitting: Family Medicine

## 2013-12-08 ENCOUNTER — Emergency Department (HOSPITAL_COMMUNITY)
Admission: EM | Admit: 2013-12-08 | Discharge: 2013-12-08 | Disposition: A | Discharge status: Home / Self Care | Payer: Medicaid Other | Attending: Emergency Medicine | Admitting: Emergency Medicine

## 2013-12-08 DIAGNOSIS — Z21 Asymptomatic human immunodeficiency virus [HIV] infection status: Secondary | ICD-10-CM | POA: Insufficient documentation

## 2013-12-08 DIAGNOSIS — I10 Essential (primary) hypertension: Secondary | ICD-10-CM | POA: Insufficient documentation

## 2013-12-08 DIAGNOSIS — E089 Diabetes mellitus due to underlying condition without complications: Secondary | ICD-10-CM

## 2013-12-08 DIAGNOSIS — E119 Type 2 diabetes mellitus without complications: Secondary | ICD-10-CM | POA: Insufficient documentation

## 2013-12-08 DIAGNOSIS — Z79899 Other long term (current) drug therapy: Secondary | ICD-10-CM | POA: Insufficient documentation

## 2013-12-08 DIAGNOSIS — M129 Arthropathy, unspecified: Secondary | ICD-10-CM | POA: Insufficient documentation

## 2013-12-08 DIAGNOSIS — E785 Hyperlipidemia, unspecified: Secondary | ICD-10-CM | POA: Insufficient documentation

## 2013-12-08 DIAGNOSIS — Z8659 Personal history of other mental and behavioral disorders: Secondary | ICD-10-CM | POA: Insufficient documentation

## 2013-12-08 DIAGNOSIS — E139 Other specified diabetes mellitus without complications: Secondary | ICD-10-CM

## 2013-12-08 DIAGNOSIS — Z7982 Long term (current) use of aspirin: Secondary | ICD-10-CM | POA: Insufficient documentation

## 2013-12-08 DIAGNOSIS — E1165 Type 2 diabetes mellitus with hyperglycemia: Secondary | ICD-10-CM

## 2013-12-08 LAB — POCT I-STAT, CHEM 8
BUN: 20 mg/dL (ref 6–23)
Calcium, Ion: 1.33 mmol/L — ABNORMAL HIGH (ref 1.12–1.23)
Chloride: 101 mEq/L (ref 96–112)
Creatinine, Ser: 1.1 mg/dL (ref 0.50–1.35)
Glucose, Bld: 526 mg/dL — ABNORMAL HIGH (ref 70–99)
HEMATOCRIT: 43 % (ref 39.0–52.0)
Hemoglobin: 14.6 g/dL (ref 13.0–17.0)
POTASSIUM: 4.5 meq/L (ref 3.7–5.3)
SODIUM: 132 meq/L — AB (ref 137–147)
TCO2: 21 mmol/L (ref 0–100)

## 2013-12-08 LAB — URINALYSIS, ROUTINE W REFLEX MICROSCOPIC
BILIRUBIN URINE: NEGATIVE
Glucose, UA: >1000 mg/dL — AB
Hgb urine dipstick: NEGATIVE
Ketones, ur: NEGATIVE mg/dL
Leukocytes, UA: NEGATIVE
Nitrite: NEGATIVE
Protein, ur: NEGATIVE mg/dL
Specific Gravity, Urine: 1.031 — ABNORMAL HIGH (ref 1.005–1.030)
UROBILINOGEN UA: 0.2 mg/dL (ref 0.0–1.0)
pH: 5 (ref 5.0–8.0)

## 2013-12-08 LAB — CBC
HCT: 38.6 % — ABNORMAL LOW (ref 39.0–52.0)
HEMOGLOBIN: 13.3 g/dL (ref 13.0–17.0)
MCH: 31.2 pg (ref 26.0–34.0)
MCHC: 34.5 g/dL (ref 30.0–36.0)
MCV: 90.6 fL (ref 78.0–100.0)
Platelets: 132 10*3/uL — ABNORMAL LOW (ref 150–400)
RBC: 4.26 MIL/uL (ref 4.22–5.81)
RDW: 12.8 % (ref 11.5–15.5)
WBC: 4.5 10*3/uL (ref 4.0–10.5)

## 2013-12-08 LAB — POCT URINALYSIS DIP (DEVICE)
BILIRUBIN URINE: NEGATIVE
Glucose, UA: 500 mg/dL — AB
Hgb urine dipstick: NEGATIVE
KETONES UR: NEGATIVE mg/dL
LEUKOCYTES UA: NEGATIVE
Nitrite: NEGATIVE
PROTEIN: NEGATIVE mg/dL
Specific Gravity, Urine: <=1.005 (ref 1.005–1.030)
Urobilinogen, UA: 0.2 mg/dL (ref 0.0–1.0)
pH: 5 (ref 5.0–8.0)

## 2013-12-08 LAB — I-STAT VENOUS BLOOD GAS, ED
Acid-base deficit: 3 mmol/L — ABNORMAL HIGH (ref 0.0–2.0)
Bicarbonate: 23 mEq/L (ref 20.0–24.0)
O2 SAT: 81 %
TCO2: 24 mmol/L (ref 0–100)
pCO2, Ven: 41.7 mmHg — ABNORMAL LOW (ref 45.0–50.0)
pH, Ven: 7.35 — ABNORMAL HIGH (ref 7.250–7.300)
pO2, Ven: 47 mmHg — ABNORMAL HIGH (ref 30.0–45.0)

## 2013-12-08 LAB — COMPREHENSIVE METABOLIC PANEL
ALT: 17 U/L (ref 0–53)
AST: 18 U/L (ref 0–37)
Albumin: 3.8 g/dL (ref 3.5–5.2)
Alkaline Phosphatase: 108 U/L (ref 39–117)
Anion gap: 17 — ABNORMAL HIGH (ref 5–15)
BUN: 19 mg/dL (ref 6–23)
CALCIUM: 10 mg/dL (ref 8.4–10.5)
CHLORIDE: 95 meq/L — AB (ref 96–112)
CO2: 20 meq/L (ref 19–32)
CREATININE: 1.02 mg/dL (ref 0.50–1.35)
GFR, EST NON AFRICAN AMERICAN: 81 mL/min — AB (ref >90)
GLUCOSE: 493 mg/dL — AB (ref 70–99)
Potassium: 4.6 mEq/L (ref 3.7–5.3)
SODIUM: 132 meq/L — AB (ref 137–147)
Total Bilirubin: 0.3 mg/dL (ref 0.3–1.2)
Total Protein: 8.2 g/dL (ref 6.0–8.3)

## 2013-12-08 LAB — CBG MONITORING, ED
Glucose-Capillary: 479 mg/dL — ABNORMAL HIGH (ref 70–99)
Glucose-Capillary: 299 mg/dL — ABNORMAL HIGH (ref 70–99)
Glucose-Capillary: 209 mg/dL — ABNORMAL HIGH (ref 70–99)

## 2013-12-08 LAB — URINE MICROSCOPIC-ADD ON

## 2013-12-08 MED ORDER — SODIUM CHLORIDE 0.9 % IV BOLUS (SEPSIS)
1000.0000 mL | Freq: Once | INTRAVENOUS | Status: AC
  Administered 2013-12-08: 1000 mL via INTRAVENOUS

## 2013-12-08 MED ORDER — INSULIN ASPART 100 UNIT/ML ~~LOC~~ SOLN
10.0000 [IU] | Freq: Once | SUBCUTANEOUS | Status: AC
  Administered 2013-12-08: 10 [IU] via INTRAVENOUS
  Filled 2013-12-08: qty 1

## 2013-12-08 NOTE — ED Notes (Signed)
Patient was discharged with all personal belongings. 

## 2013-12-08 NOTE — ED Notes (Signed)
The pt has half the nss he feels ok

## 2013-12-08 NOTE — ED Notes (Signed)
Report called to Karen, ED First Nurse. 

## 2013-12-08 NOTE — ED Notes (Addendum)
Pt sent here [facility] due to hyperglycemia, cbg was 526. Pt went to [facility] due to cbg being high but denies any symptoms other than mild dizziness. No acute distress noted at triage. Pt takes metformin at home as prescribed. Report similar episode in past and given shot of insulin and dc home.

## 2013-12-08 NOTE — ED Provider Notes (Signed)
CSN: [id number]     Arrival date & time 12/08/13  1641 History   First MD Initiated Contact with Patient 12/08/13 1739     Chief Complaint  Patient presents with  . Hyperglycemia     (Consider location/radiation/quality/duration/timing/severity/associated sxs/prior Treatment) HPI Comments: Pt states that he was seen at the urgent care for elevated blood sugar. Pt was sent down here for further evaluation. Denies n/v/d, cp or sob. Pt states that he is ate half a watermelon yesterday. Has been taking his medications correctly.  The history is provided by the patient. No language interpreter was used.    Past Medical History  Diagnosis Date  . Diabetes mellitus   . HIV (human immunodeficiency virus infection)   . Hyperlipidemia   . Hypertension   . Arthritis   . Depression    History reviewed. No pertinent past surgical history. Family History  Problem Relation Age of Onset  . Hypertension Mother    History  Substance Use Topics  . Smoking status: Never Smoker   . Smokeless tobacco: Never Used  . Alcohol Use: 0.5 oz/week    1 drink(s) per week     Comment: 1 can beer a week    Review of Systems  Constitutional: Negative.   Respiratory: Negative.   Cardiovascular: Negative.       Allergies  Lisinopril  Home Medications   Prior to Admission medications   Medication Sig Start Date End Date Taking? Authorizing Provider  amLODipine (NORVASC) 5 MG tablet TAKE 1 TABLET BY MOUTH ONCE DAILY 06/07/13   Cliffton AstersJohn Campbell, MD  aspirin 81 MG EC tablet Take 1 tablet (81 mg total) by mouth daily. Swallow whole. 09/20/12   Cliffton AstersJohn Campbell, MD  atorvastatin (LIPITOR) 40 MG tablet Take 40 mg by mouth daily. 07/31/12   Cliffton AstersJohn Campbell, MD  beclomethasone (BECONASE AQ) 42 MCG/SPRAY nasal spray Place 2 sprays into both nostrils 2 (two) times daily. Dose is for each nostril. 09/04/13   Randall Hissornelius N Van Dam, MD  Blood Glucose Monitoring Suppl (WAVESENSE PRESTO PRO METER) DEVI by Does not apply route.  1 meter      Historical Provider, MD  elvitegravir-cobicistat-emtricitabine-tenofovir (STRIBILD) 150-150-200-300 MG TABS tablet Take 1 tablet by mouth daily with breakfast. 08/21/13   Cliffton AstersJohn Campbell, MD  glipiZIDE (GLUCOTROL) 10 MG tablet Take 1 tablet (10 mg total) by mouth daily. 05/14/13   Cliffton AstersJohn Campbell, MD  glucose blood (WAVESENSE PRESTO TEST) test strip Use as directed 08/27/12   Cliffton AstersJohn Campbell, MD  metFORMIN (GLUCOPHAGE) 1000 MG tablet Take 1 tablet (1,000 mg total) by mouth 2 (two) times daily with a meal. 09/20/13   Doris Cheadleeepak Advani, MD  ondansetron (ZOFRAN) 4 MG tablet One po q 6h prn nausea 11/27/13   Hayden Rasmussenavid Mabe, NP  triamcinolone cream (KENALOG) 0.1 % Apply to affected area 2 to 3 times a day as directed. 11/27/13   Hayden Rasmussenavid Mabe, NP   BP 123/81  Pulse 90  Temp(Src) 98 F (36.7 C) (Oral)  Resp 20  SpO2 96% Physical Exam  Nursing note and vitals reviewed. Constitutional: He is oriented to person, place, and time. He appears well-developed and well-nourished.  HENT:  Head: Normocephalic and atraumatic.  Cardiovascular: Normal rate and regular rhythm.   Pulmonary/Chest: Effort normal and breath sounds normal.  Musculoskeletal: Normal range of motion.  Neurological: He is alert and oriented to person, place, and time. Coordination normal.  Skin: Skin is warm and dry.  Psychiatric: He has a normal mood and affect.  ED Course  Procedures (including critical care time) Labs Review Labs Reviewed  CBC - Abnormal; Notable for the following:    HCT 38.6 (*)    Platelets 132 (*)    All other components within normal limits  COMPREHENSIVE METABOLIC PANEL - Abnormal; Notable for the following:    Sodium 132 (*)    Chloride 95 (*)    Glucose, Bld 493 (*)    GFR calc non Af Amer 81 (*)    Anion gap 17 (*)    All other components within normal limits  URINALYSIS, ROUTINE W REFLEX MICROSCOPIC - Abnormal; Notable for the following:    Specific Gravity, Urine 1.031 (*)    Glucose, UA >1000  (*)    All other components within normal limits  CBG MONITORING, ED - Abnormal; Notable for the following:    Glucose-Capillary 479 (*)    All other components within normal limits  CBG MONITORING, ED - Abnormal; Notable for the following:    Glucose-Capillary 299 (*)    All other components within normal limits  I-STAT VENOUS BLOOD GAS, ED - Abnormal; Notable for the following:    pH, Ven 7.350 (*)    pCO2, Ven 41.7 (*)    pO2, Ven 47.0 (*)    Acid-base deficit 3.0 (*)    All other components within normal limits  URINE MICROSCOPIC-ADD ON  BLOOD GAS, VENOUS  CBG MONITORING, ED  CBG MONITORING, ED    Imaging Review No results found.   EKG Interpretation None      MDM   Final diagnoses:  Hyperglycemia due to type 2 diabetes mellitus    Pt not in dka. Pt is okay to go home    Teressa Lower, NP 12/08/13 2053

## 2013-12-08 NOTE — ED Notes (Signed)
Pt reports high CBGs today.  Was 480 this morning; @ 1500 = 580.  Only had 500mg  Metformin this morning, but took 1000mg  two hrs ago.  C/O slight dizziness, otherwise no c/o's.

## 2013-12-08 NOTE — ED Provider Notes (Signed)
CSN: [id number]     Arrival date & time 12/08/13  1540 History   First MD Initiated Contact with Patient 12/08/13 1557     Chief Complaint  Patient presents with  . Hyperglycemia   (Consider location/radiation/quality/duration/timing/severity/associated sxs/prior Treatment) Patient is a 53 y.o. male presenting with hyperglycemia.  Hyperglycemia Blood sugar level PTA:  400-500 Severity:  Moderate Onset quality:  Sudden Duration:  6 hours Progression:  Unchanged Chronicity:  Recurrent Diabetes status:  Controlled with oral medications Context: recent change in diet   Ineffective treatments:  Oral agents Associated symptoms: dizziness, nausea and weakness     Past Medical History  Diagnosis Date  . Diabetes mellitus   . HIV (human immunodeficiency virus infection)   . Hyperlipidemia   . Hypertension   . Arthritis   . Depression    History reviewed. No pertinent past surgical history. Family History  Problem Relation Age of Onset  . Hypertension Mother    History  Substance Use Topics  . Smoking status: Never Smoker   . Smokeless tobacco: Never Used  . Alcohol Use: 0.5 oz/week    1 drink(s) per week     Comment: 1 can beer a week    Review of Systems  Gastrointestinal: Positive for nausea.  Neurological: Positive for dizziness and weakness.    Allergies  Lisinopril  Home Medications   Prior to Admission medications   Medication Sig Start Date End Date Taking? Authorizing Provider  amLODipine (NORVASC) 5 MG tablet TAKE 1 TABLET BY MOUTH ONCE DAILY 06/07/13  Yes Cliffton AstersJohn Campbell, MD  aspirin 81 MG EC tablet Take 1 tablet (81 mg total) by mouth daily. Swallow whole. 09/20/12  Yes Cliffton AstersJohn Campbell, MD  atorvastatin (LIPITOR) 40 MG tablet Take 40 mg by mouth daily. 07/31/12  Yes Cliffton AstersJohn Campbell, MD  beclomethasone (BECONASE AQ) 42 MCG/SPRAY nasal spray Place 2 sprays into both nostrils 2 (two) times daily. Dose is for each nostril. 09/04/13  Yes Randall Hissornelius N Van Dam, MD  Blood  Glucose Monitoring Suppl (WAVESENSE PRESTO PRO METER) DEVI by Does not apply route. 1 meter     Yes Historical Provider, MD  elvitegravir-cobicistat-emtricitabine-tenofovir (STRIBILD) 150-150-200-300 MG TABS tablet Take 1 tablet by mouth daily with breakfast. 08/21/13  Yes Cliffton AstersJohn Campbell, MD  glipiZIDE (GLUCOTROL) 10 MG tablet Take 1 tablet (10 mg total) by mouth daily. 05/14/13  Yes Cliffton AstersJohn Campbell, MD  glucose blood (WAVESENSE PRESTO TEST) test strip Use as directed 08/27/12  Yes Cliffton AstersJohn Campbell, MD  metFORMIN (GLUCOPHAGE) 1000 MG tablet Take 1 tablet (1,000 mg total) by mouth 2 (two) times daily with a meal. 09/20/13  Yes Doris Cheadleeepak Advani, MD  ondansetron (ZOFRAN) 4 MG tablet One po q 6h prn nausea 11/27/13  Yes Hayden Rasmussenavid Mabe, NP  triamcinolone cream (KENALOG) 0.1 % Apply to affected area 2 to 3 times a day as directed. 11/27/13  Yes Hayden Rasmussenavid Mabe, NP   BP 124/78  Pulse 86  Temp(Src) 98.1 F (36.7 C) (Oral)  Resp 20  SpO2 98% Physical Exam  Nursing note and vitals reviewed. Constitutional: He is oriented to person, place, and time. He appears well-developed and well-nourished. No distress.  Neck: Normal range of motion. Neck supple.  Cardiovascular: Normal heart sounds.   Pulmonary/Chest: Effort normal and breath sounds normal.  Abdominal: Soft. Bowel sounds are normal.  Lymphadenopathy:    He has no cervical adenopathy.  Neurological: He is alert and oriented to person, place, and time.  Skin: Skin is warm and dry.  ED Course  Procedures (including critical care time) Labs Review Labs Reviewed  POCT I-STAT, CHEM 8 - Abnormal; Notable for the following:    Sodium 132 (*)    Glucose, Bld 526 (*)    Calcium, Ion 1.33 (*)    All other components within normal limits  POCT URINALYSIS DIP (DEVICE) - Abnormal; Notable for the following:    Glucose, UA 500 (*)    All other components within normal limits    Imaging Review No results found.   MDM   1. Diabetes mellitus due to underlying  condition without complications    Sent for med mngment of bs 526, mild hyponat.Linna Hoff, MD 12/08/13 1630

## 2013-12-08 NOTE — ED Notes (Signed)
Pt down from [facility] with high blood sugar  No pain.  He thinks he ate too much watermellon yesterday that drove his bld sugar up.  Non-insulin

## 2013-12-09 ENCOUNTER — Other Ambulatory Visit: Payer: Self-pay | Admitting: *Deleted

## 2013-12-09 DIAGNOSIS — B20 Human immunodeficiency virus [HIV] disease: Secondary | ICD-10-CM

## 2013-12-09 MED ORDER — ELVITEG-COBIC-EMTRICIT-TENOFDF 150-150-200-300 MG PO TABS: 1.0000 | ORAL_TABLET | Freq: Every day | ORAL | Status: DC

## 2013-12-09 NOTE — ED Provider Notes (Signed)
Medical screening examination/treatment/procedure(s) were performed by non-physician practitioner and as supervising physician I was immediately available for consultation/collaboration.     Dannielle Baskins, MD 12/09/13 2251 

## 2013-12-20 ENCOUNTER — Ambulatory Visit: Payer: Self-pay

## 2014-01-25 ENCOUNTER — Encounter (HOSPITAL_COMMUNITY): Payer: Self-pay | Admitting: Emergency Medicine

## 2014-01-25 ENCOUNTER — Emergency Department (HOSPITAL_COMMUNITY)
Admission: EM | Admit: 2014-01-25 | Discharge: 2014-01-25 | Disposition: A | Discharge status: Home / Self Care | Payer: Medicaid Other | Attending: Emergency Medicine | Admitting: Emergency Medicine

## 2014-01-25 DIAGNOSIS — S139XXA Sprain of joints and ligaments of unspecified parts of neck, initial encounter: Secondary | ICD-10-CM | POA: Diagnosis not present

## 2014-01-25 DIAGNOSIS — Y929 Unspecified place or not applicable: Secondary | ICD-10-CM | POA: Insufficient documentation

## 2014-01-25 DIAGNOSIS — M542 Cervicalgia: Secondary | ICD-10-CM | POA: Insufficient documentation

## 2014-01-25 DIAGNOSIS — S161XXA Strain of muscle, fascia and tendon at neck level, initial encounter: Secondary | ICD-10-CM

## 2014-01-25 DIAGNOSIS — E119 Type 2 diabetes mellitus without complications: Secondary | ICD-10-CM | POA: Insufficient documentation

## 2014-01-25 DIAGNOSIS — Z21 Asymptomatic human immunodeficiency virus [HIV] infection status: Secondary | ICD-10-CM | POA: Insufficient documentation

## 2014-01-25 DIAGNOSIS — Z7982 Long term (current) use of aspirin: Secondary | ICD-10-CM | POA: Diagnosis not present

## 2014-01-25 DIAGNOSIS — X58XXXA Exposure to other specified factors, initial encounter: Secondary | ICD-10-CM | POA: Insufficient documentation

## 2014-01-25 DIAGNOSIS — I10 Essential (primary) hypertension: Secondary | ICD-10-CM | POA: Diagnosis not present

## 2014-01-25 DIAGNOSIS — Z79899 Other long term (current) drug therapy: Secondary | ICD-10-CM | POA: Insufficient documentation

## 2014-01-25 DIAGNOSIS — Y939 Activity, unspecified: Secondary | ICD-10-CM | POA: Diagnosis not present

## 2014-01-25 MED ORDER — MELOXICAM 15 MG PO TABS: 15.0000 mg | ORAL_TABLET | Freq: Every day | ORAL | Status: DC

## 2014-01-25 MED ORDER — CYCLOBENZAPRINE HCL 5 MG PO TABS: 5.0000 mg | ORAL_TABLET | Freq: Three times a day (TID) | ORAL | Status: DC | PRN

## 2014-01-25 MED ORDER — HYDROCODONE-ACETAMINOPHEN 5-325 MG PO TABS: ORAL_TABLET | ORAL | Status: DC

## 2014-01-25 NOTE — ED Notes (Signed)
Pt reports neck spasms x 4 days.  Pt reports hx of same.

## 2014-01-25 NOTE — Discharge Instructions (Signed)
TREATMENT  °Treatment initially involves the use of ice and medication to help reduce pain and inflammation. It is also important to perform strengthening and stretching exercises and modify activities that worsen symptoms so the injury does not get worse. These exercises may be performed at home or with a therapist. For patients who experience severe symptoms, a soft padded collar may be recommended to be worn around the neck.  °Improving your posture may help reduce symptoms. Posture improvement includes pulling your chin and abdomen in while sitting or standing. If you are sitting, sit in a firm chair with your buttocks against the back of the chair. While sleeping, try replacing your pillow with a small towel rolled to 2 inches in diameter, or use a cervical pillow. Poor sleeping positions delay healing.  ° °MEDICATION  °· If pain medication is necessary, nonsteroidal anti-inflammatory medications, such as aspirin and ibuprofen, or other minor pain relievers, such as acetaminophen, are often recommended. °· Do not take pain medication for 7 days before surgery. °· Prescription pain relievers may be given if deemed necessary by your caregiver. Use only as directed and only as much as you need. ° °HEAT AND COLD:  °· Cold treatment (icing) relieves pain and reduces inflammation. Cold treatment should be applied for 10 to 15 minutes every 2 to 3 hours for inflammation and pain and immediately after any activity that aggravates your symptoms. Use ice packs or an ice massage. °· Heat treatment may be used prior to performing the stretching and strengthening activities prescribed by your caregiver, physical therapist, or athletic trainer. Use a heat pack or a warm soak. ° °SEEK MEDICAL CARE IF:  °· Symptoms get worse or do not improve in 2 weeks despite treatment. °· New, unexplained symptoms develop (drugs used in treatment may produce side effects). ° °EXERCISES °RANGE OF MOTION (ROM) AND STRETCHING EXERCISES -  Cervical Strain and Sprain °These exercises may help you when beginning to rehabilitate your injury. In order to successfully resolve your symptoms, you must improve your posture. These exercises are designed to help reduce the forward-head and rounded-shoulder posture which contributes to this condition. Your symptoms may resolve with or without further involvement from your physician, physical therapist or athletic trainer. While completing these exercises, remember:  °· Restoring tissue flexibility helps normal motion to return to the joints. This allows healthier, less painful movement and activity. °· An effective stretch should be held for at least 20 seconds, although you may need to begin with shorter hold times for comfort. °· A stretch should never be painful. You should only feel a gentle lengthening or release in the stretched tissue. ° °STRETCH- Axial Extensors °· Lie on your back on the floor. You may bend your knees for comfort. Place a rolled up hand towel or dish towel, about 2 inches in diameter, under the part of your head that makes contact with the floor. °· Gently tuck your chin, as if trying to make a "double chin," until you feel a gentle stretch at the base of your head. °· Hold _____10_____ seconds. °Repeat _____10_____ times. Complete this exercise _____2_____ times per day.  ° °STRETECH - Axial Extension  °· Stand or sit on a firm surface. Assume a good posture: chest up, shoulders drawn back, abdominal muscles slightly tense, knees unlocked (if standing) and feet hip width apart. °· Slowly retract your chin so your head slides back and your chin slightly lowers.Continue to look straight ahead. °· You should feel a gentle stretch   in the back of your head. Be certain not to feel an aggressive stretch since this can cause headaches later. °· Hold for ____10______ seconds. °Repeat _____10_____ times. Complete this exercise ____2______ times per day. ° °STRETCH  Cervical Side Bend  °· Stand  or sit on a firm surface. Assume a good posture: chest up, shoulders drawn back, abdominal muscles slightly tense, knees unlocked (if standing) and feet hip width apart. °· Without letting your nose or shoulders move, slowly tip your right / left ear to your shoulder until your feel a gentle stretch in the muscles on the opposite side of your neck. °· Hold _____10_____ seconds. °Repeat _____10_____ times. Complete this exercise _____2_____ times per day. ° °STRETCH  Cervical Rotators  °· Stand or sit on a firm surface. Assume a good posture: chest up, shoulders drawn back, abdominal muscles slightly tense, knees unlocked (if standing) and feet hip width apart. °· Keeping your eyes level with the ground, slowly turn your head until you feel a gentle stretch along the back and opposite side of your neck. °· Hold _____10_____ seconds. °Repeat ____10______ times. Complete this exercise ____2______ times per day. ° °RANGE OF MOTION - Neck Circles  °· Stand or sit on a firm surface. Assume a good posture: chest up, shoulders drawn back, abdominal muscles slightly tense, knees unlocked (if standing) and feet hip width apart. °· Gently roll your head down and around from the back of one shoulder to the back of the other. The motion should never be forced or painful. °· Repeat the motion 10-20 times, or until you feel the neck muscles relax and loosen. °Repeat ____10______ times. Complete the exercise _____2_____ times per day. ° °STRENGTHENING EXERCISES - Cervical Strain and Sprain °These exercises may help you when beginning to rehabilitate your injury. They may resolve your symptoms with or without further involvement from your physician, physical therapist or athletic trainer. While completing these exercises, remember:  °· Muscles can gain both the endurance and the strength needed for everyday activities through controlled exercises. °· Complete these exercises as instructed by your physician, physical therapist or  athletic trainer. Progress the resistance and repetitions only as guided. °· You may experience muscle soreness or fatigue, but the pain or discomfort you are trying to eliminate should never worsen during these exercises. If this pain does worsen, stop and make certain you are following the directions exactly. If the pain is still present after adjustments, discontinue the exercise until you can discuss the trouble with your clinician. ° °STRENGTH Cervical Flexors, Isometric °· Face a wall, standing about 6 inches away. Place a small pillow, a ball about 6-8 inches in diameter, or a folded towel between your forehead and the wall. °· Slightly tuck your chin and gently push your forehead into the soft object. Push only with mild to moderate intensity, building up tension gradually. Keep your jaw and forehead relaxed. °· Hold 10 to 20 seconds. Keep your breathing relaxed. °· Release the tension slowly. Relax your neck muscles completely before you start the next repetition. °Repeat _____10_____ times. Complete this exercise _____2_____ times per day. ° °STRENGTH- Cervical Lateral Flexors, Isometric  °· Stand about 6 inches away from a wall. Place a small pillow, a ball about 6-8 inches in diameter, or a folded towel between the side of your head and the wall. °· Slightly tuck your chin and gently tilt your head into the soft object. Push only with mild to moderate intensity, building up tension gradually. Keep   your jaw and forehead relaxed. °· Hold 10 to 20 seconds. Keep your breathing relaxed. °· Release the tension slowly. Relax your neck muscles completely before you start the next repetition. °Repeat _____10_____ times. Complete this exercise ____2______ times per day. ° °STRENGTH  Cervical Extensors, Isometric  °· Stand about 6 inches away from a wall. Place a small pillow, a ball about 6-8 inches in diameter, or a folded towel between the back of your head and the wall. °· Slightly tuck your chin and gently  tilt your head back into the soft object. Push only with mild to moderate intensity, building up tension gradually. Keep your jaw and forehead relaxed. °· Hold 10 to 20 seconds. Keep your breathing relaxed. °· Release the tension slowly. Relax your neck muscles completely before you start the next repetition. °Repeat _____10_____ times. Complete this exercise _____2_____ times per day. ° °POSTURE AND BODY MECHANICS CONSIDERATIONS - Cervical Strain and Sprain °Keeping correct posture when sitting, standing or completing your activities will reduce the stress put on different body tissues, allowing injured tissues a chance to heal and limiting painful experiences. The following are general guidelines for improved posture. Your physician or physical therapist will provide you with any instructions specific to your needs. While reading these guidelines, remember: °· The exercises prescribed by your provider will help you have the flexibility and strength to maintain correct postures. °· The correct posture provides the optimal environment for your joints to work. All of your joints have less wear and tear when properly supported by a spine with good posture. This means you will experience a healthier, less painful body. °· Correct posture must be practiced with all of your activities, especially prolonged sitting and standing. Correct posture is as important when doing repetitive low-stress activities (typing) as it is when doing a single heavy-load activity (lifting). °PROLONGED STANDING WHILE SLIGHTLY LEANING FORWARD °When completing a task that requires you to lean forward while standing in one place for a long time, place either foot up on a stationary 2-4 inch high object to help maintain the best posture. When both feet are on the ground, the low back tends to lose its slight inward curve. If this curve flattens (or becomes too large), then the back and your other joints will experience too much stress, fatigue  more quickly and can cause pain.  °RESTING POSITIONS °Consider which positions are most painful for you when choosing a resting position. If you have pain with flexion-based activities (sitting, bending, stooping, squatting), choose a position that allows you to rest in a less flexed posture. You would want to avoid curling into a fetal position on your side. If your pain worsens with extension-based activities (prolonged standing, working overhead), avoid resting in an extended position such as sleeping on your stomach. Most people will find more comfort when they rest with their spine in a more neutral position, neither too rounded nor too arched. Lying on a non-sagging bed on your side with a pillow between your knees, or on your back with a pillow under your knees will often provide some relief. Keep in mind, being in any one position for a prolonged period of time, no matter how correct your posture, can still lead to stiffness. °WALKING °Walk with an upright posture. Your ears, shoulders and hips should all line-up. °OFFICE WORK °When working at a desk, create an environment that supports good, upright posture. Without extra support, muscles fatigue and lead to excessive strain on joints and other tissues. °  CHAIR: °· A chair should be able to slide under your desk when your back makes contact with the back of the chair. This allows you to work closely. °· The chair's height should allow your eyes to be level with the upper part of your monitor and your hands to be slightly lower than your elbows. °· Body position: °· Your feet should make contact with the floor. If this is not possible, use a foot rest. °· Keep your ears over your shoulders. This will reduce stress on your neck and low back. °Document Released: 05/16/2005 Document Revised: 08/08/2011 Document Reviewed: 08/28/2008 °[employer]® Patient Information ©2013 [employer]. ° ° °

## 2014-01-25 NOTE — ED Notes (Signed)
Declined W/C at D/C and was escorted to lobby by RN. 

## 2014-01-25 NOTE — ED Provider Notes (Signed)
Chief Complaint   Chief Complaint  Patient presents with  . Neck Pain    History of Present Illness   Benjamin Hunter is a 53 year old male with diabetes and HIV infection who has had a one-week history of left neck pain and muscle spasm. The patient states he picked his mother up, and this is when the pain began. He denies any radiation down the arm, numbness, tingling, or weakness. The pain is worse with movement of the neck. He denies any fever, chills, headache, chest pain, or shortness of breath. He's had the same thing before and states he responds well to muscle relaxers.  Review of Systems   Other than noted above, the patient denies any of the following symptoms: Constitutional:  No fever or chills. Neck:  No swelling, or adenopathy.   Cardiac:  No chest pain, tightness, or pressure. Respiratory:  No cough or dyspnea. M-S:  No joint pain, muscle pain, or back problems. Neuro:  No headache, muscle weakness, or paresthesias.  PMFSH   Past medical history, family history, social history, meds, and allergies were reviewed.  He's allergic to lisinopril. Current meds include Norvasc, aspirin, Lipitor, stride told, Glucotrol, Mobic, and Glucophage. He has hypertension, diabetes, and HIV infection. He is followed by Dr. Orvan Falconer. He was seen within the last couple of months. His most recent CD4 count and viral titer looked good.  Physical Examination    Vital signs:  BP 130/90  Pulse 76  Temp(Src) 98.1 F (36.7 C) (Oral)  Resp 12  Ht  (1.803 m)  Wt 183 lb (83.008 kg)  BMI 25.53 kg/m2  SpO2 100% General:  Alert, oriented and in no distress. Eye:  PERRL, full EOMs. ENT:  Pharynx clear, no oral lesions. Neck:  There is pain to palpation over the left trapezius ridge. The neck has a limited range of motion with pain.  Spurling's test was negative. Lungs:  No respiratory distress.  Breath sounds clear and equal bilaterally.  No wheezes, rales or rhonchi. Heart:   Regular rhythm.  No gallops, murmers, or rubs. Ext:  No upper extremity edema, pulses full.  Full ROM of joints with no joint or muscle pain to palpation. Neuro:  Alert and oriented times 3.  No focal muscle weakness.  DTRs symmetric.  Sensation intact to light touch. Skin: Clear, warm and dry.  No rash.  Good capillary refill.  Assessment   The encounter diagnosis was Cervical strain, initial encounter.  Plan    1.  Meds:  The following meds were prescribed:   Discharge Medication List as of 01/25/2014  2:11 PM    START taking these medications   Details  cyclobenzaprine (FLEXERIL) 5 MG tablet Take 1 tablet (5 mg total) by mouth 3 (three) times daily as needed for muscle spasms., Starting 01/25/2014, Until Discontinued, Print    HYDROcodone-acetaminophen (NORCO/VICODIN) 5-325 MG per tablet 1 to 2 tabs every 4 to 6 hours as needed for pain., Print    meloxicam (MOBIC) 15 MG tablet Take 1 tablet (15 mg total) by mouth daily., Starting 01/25/2014, Until Discontinued, Print        2.  Patient Education/Counseling:  The patient was given appropriate handouts, self care instructions, and instructed in symptomatic relief.    3.  Follow up:  The patient was told to follow up here if no better in 3 to 4 days, or sooner if becoming worse in any way, and given some red flag symptoms such as worsening pain or  new neurological symptoms which would prompt immediate return.       Reuben Likes, MD 01/25/14 2107

## 2014-01-27 ENCOUNTER — Other Ambulatory Visit: Payer: Self-pay | Admitting: Licensed Clinical Social Worker

## 2014-01-27 MED ORDER — CYCLOBENZAPRINE HCL 5 MG PO TABS: 5.0000 mg | ORAL_TABLET | Freq: Three times a day (TID) | ORAL | Status: DC | PRN

## 2014-02-09 ENCOUNTER — Emergency Department (HOSPITAL_COMMUNITY)
Admission: EM | Admit: 2014-02-09 | Discharge: 2014-02-09 | Disposition: A | Discharge status: Home / Self Care | Payer: No Typology Code available for payment source | Source: Home / Self Care | Attending: Emergency Medicine | Admitting: Emergency Medicine

## 2014-02-09 ENCOUNTER — Ambulatory Visit (HOSPITAL_COMMUNITY)
Admission: RE | Admit: 2014-02-09 | Discharge: 2014-02-09 | Disposition: A | Discharge status: Home / Self Care | Payer: No Typology Code available for payment source | Source: Ambulatory Visit | Attending: Emergency Medicine | Admitting: Emergency Medicine

## 2014-02-09 ENCOUNTER — Encounter (HOSPITAL_COMMUNITY): Payer: Self-pay | Admitting: Emergency Medicine

## 2014-02-09 DIAGNOSIS — B349 Viral infection, unspecified: Secondary | ICD-10-CM

## 2014-02-09 DIAGNOSIS — Z21 Asymptomatic human immunodeficiency virus [HIV] infection status: Secondary | ICD-10-CM | POA: Diagnosis not present

## 2014-02-09 DIAGNOSIS — R062 Wheezing: Secondary | ICD-10-CM | POA: Insufficient documentation

## 2014-02-09 DIAGNOSIS — Z2089 Contact with and (suspected) exposure to other communicable diseases: Secondary | ICD-10-CM

## 2014-02-09 DIAGNOSIS — Z20828 Contact with and (suspected) exposure to other viral communicable diseases: Secondary | ICD-10-CM

## 2014-02-09 DIAGNOSIS — B9789 Other viral agents as the cause of diseases classified elsewhere: Secondary | ICD-10-CM

## 2014-02-09 LAB — POCT URINALYSIS DIP (DEVICE)
Bilirubin Urine: NEGATIVE
Glucose, UA: NEGATIVE mg/dL
Hgb urine dipstick: NEGATIVE
Ketones, ur: NEGATIVE mg/dL
LEUKOCYTES UA: NEGATIVE
NITRITE: NEGATIVE
PH: 5 (ref 5.0–8.0)
PROTEIN: 30 mg/dL — AB
Specific Gravity, Urine: 1.015 (ref 1.005–1.030)
Urobilinogen, UA: 0.2 mg/dL (ref 0.0–1.0)

## 2014-02-09 NOTE — Discharge Instructions (Signed)

## 2014-02-09 NOTE — ED Notes (Signed)
C/O intermittent generalized abdominal cramping since yesterday with dull HA and nausea.  Denies v/d; denies fevers.  Has taken a previous Rx nausea med (unk name).  Last BM yesterday - states was normal.  Denies constipation.

## 2014-02-09 NOTE — ED Provider Notes (Signed)
CSN: [id number]     Arrival date & time 02/09/14  1102 History   None    Chief Complaint  Patient presents with  . Abdominal Pain   (Consider location/radiation/quality/duration/timing/severity/associated sxs/prior Treatment) HPI 53 year old HIV-positive male, on quadruple HAART, with CD4 count over 1200 and undetectable viral load, presents complaining of exposure to someone with pneumonia last week and subsequently developing abdominal cramping that has resolved, now with a slight headache in the top of his head and very mild dizziness. He is unaware of the specifics of his friends diagnosis but he thinks that he was diagnosed with pneumonia. He denies any cough, fever, chills, NVD, chest pain, shortness of breath. His abdominal pain was in the right side of his abdomen, now he feels just a little bit of soreness there.  Past Medical History  Diagnosis Date  . Diabetes mellitus   . HIV (human immunodeficiency virus infection)   . Hyperlipidemia   . Hypertension   . Arthritis   . Depression    History reviewed. No pertinent past surgical history. Family History  Problem Relation Age of Onset  . Hypertension Mother    History  Substance Use Topics  . Smoking status: Never Smoker   . Smokeless tobacco: Never Used  . Alcohol Use: Yes    Review of Systems  Gastrointestinal: Positive for abdominal pain. Negative for nausea, vomiting and diarrhea.  Neurological: Positive for dizziness and headaches.  All other systems reviewed and are negative.   Allergies  Lisinopril  Home Medications   Prior to Admission medications   Medication Sig Start Date End Date Taking? Authorizing Provider  amLODipine (NORVASC) 5 MG tablet Take 5 mg by mouth daily.   Yes Historical Provider, MD  aspirin 81 MG EC tablet Take 1 tablet (81 mg total) by mouth daily. Swallow whole. 09/20/12  Yes [name], MD  atorvastatin (LIPITOR) 40 MG tablet Take 40 mg by mouth daily. 07/31/12  Yes [name], MD   Blood Glucose Monitoring Suppl (WAVESENSE PRESTO PRO METER) DEVI by Does not apply route. 1 meter     Yes Historical Provider, MD  cyclobenzaprine (FLEXERIL) 5 MG tablet Take 1 tablet (5 mg total) by mouth 3 (three) times daily as needed for muscle spasms. 01/27/14  Yes [name], MD  elvitegravir-cobicistat-emtricitabine-tenofovir (STRIBILD) 150-150-200-300 MG TABS tablet Take 1 tablet by mouth daily with breakfast. 12/09/13  Yes Judyann Munson, MD  glipiZIDE (GLUCOTROL) 10 MG tablet Take 1 tablet (10 mg total) by mouth daily. 05/14/13  Yes [name], MD  glucose blood (WAVESENSE PRESTO TEST) test strip Use as directed 08/27/12  Yes [name], MD  HYDROcodone-acetaminophen (NORCO/VICODIN) 5-325 MG per tablet 1 to 2 tabs every 4 to 6 hours as needed for pain. 01/25/14  Yes Reuben Likes, MD  metFORMIN (GLUCOPHAGE) 1000 MG tablet Take 1 tablet (1,000 mg total) by mouth 2 (two) times daily with a meal. 09/20/13  Yes Deepak Advani, MD  meloxicam (MOBIC) 15 MG tablet Take 1 tablet (15 mg total) by mouth daily. 01/25/14   Reuben Likes, MD   BP 155/98  Pulse 77  Temp(Src) 98 F (36.7 C) (Oral)  Resp 12  SpO2 98% Physical Exam  Nursing note and vitals reviewed. Constitutional: He is oriented to person, place, and time. He appears well-developed and well-nourished. No distress.  HENT:  Head: Normocephalic.  Cardiovascular: Normal rate, regular rhythm, normal heart sounds and intact distal pulses.   Pulmonary/Chest: Effort normal. No respiratory distress. He has wheezes (  right lower lobe only). He has no rales. He exhibits no tenderness.  Abdominal: Soft. Normal appearance and bowel sounds are normal. He exhibits no distension and no mass. There is no hepatosplenomegaly. There is tenderness (minimal tenderness on the right upper quadrant and right lower quadrant). There is no rigidity, no rebound, no guarding, no CVA tenderness, no tenderness at McBurney's point and negative Murphy's sign.   Neurological: He is alert and oriented to person, place, and time. He has normal strength and normal reflexes. No cranial nerve deficit or sensory deficit. He exhibits normal muscle tone. He displays a negative Romberg sign. Coordination and gait normal.  Skin: Skin is warm and dry. No rash noted. He is not diaphoretic.  Psychiatric: He has a normal mood and affect. Judgment normal.    ED Course  Procedures (including critical care time) Labs Review Labs Reviewed  POCT URINALYSIS DIP (DEVICE) - Abnormal; Notable for the following:    Protein, ur 30 (*)    All other components within normal limits    Imaging Review Dg Chest 2 View  02/09/2014   CLINICAL DATA:  Wheezing.  Exposure to pneumonia.  HIV-positive.  EXAM: CHEST  2 VIEW  COMPARISON:  08/18/2012 and 06/12/2008  FINDINGS: The heart size and mediastinal contours are within normal limits. Both lungs are clear. Negative for airspace disease, interstitial abnormality, or pleural effusion. Negative for pneumothorax. Visualized bowel gas pattern is normal. The visualized skeletal structures are unremarkable.  IMPRESSION: No active cardiopulmonary disease.   Electronically Signed   By: Britta Mccreedy M.D.   On: 02/09/2014 13:26     MDM   1. Viral illness   2. Exposure to pneumonia    Most likely is recovering from a very mild case of some sort of viral gastritis. His vitals are normal and his physical exam is reassuring today. Will order outpatient chest x-ray to rule out pneumonia given the focal wheeze in the right lower lobe. I do not suspect he has contracted pneumonia.  Chest x-ray is normal  Graylon Good, PA-C 02/09/14 1528

## 2014-02-10 NOTE — ED Provider Notes (Signed)
Medical screening examination/treatment/procedure(s) were performed by non-physician practitioner and as supervising physician I was immediately available for consultation/collaboration.  Allycia Pitz, M.D.  Jon Lall C Ellanor Feuerstein, MD 02/10/14 0748 

## 2014-02-26 ENCOUNTER — Ambulatory Visit: Payer: No Typology Code available for payment source

## 2014-02-28 ENCOUNTER — Ambulatory Visit (INDEPENDENT_AMBULATORY_CARE_PROVIDER_SITE_OTHER): Payer: No Typology Code available for payment source | Admitting: *Deleted

## 2014-02-28 DIAGNOSIS — Z23 Encounter for immunization: Secondary | ICD-10-CM

## 2014-04-09 ENCOUNTER — Other Ambulatory Visit: Payer: Self-pay | Admitting: *Deleted

## 2014-04-09 DIAGNOSIS — E785 Hyperlipidemia, unspecified: Secondary | ICD-10-CM

## 2014-04-09 MED ORDER — ATORVASTATIN CALCIUM 40 MG PO TABS: 40.0000 mg | ORAL_TABLET | Freq: Every day | ORAL | Status: DC

## 2014-06-03 ENCOUNTER — Other Ambulatory Visit (INDEPENDENT_AMBULATORY_CARE_PROVIDER_SITE_OTHER): Payer: Medicare Other

## 2014-06-03 ENCOUNTER — Ambulatory Visit: Payer: Medicare Other | Attending: Internal Medicine | Admitting: Internal Medicine

## 2014-06-03 VITALS — BP 117/75 | HR 87 | Temp 97.7°F | Resp 20

## 2014-06-03 DIAGNOSIS — B20 Human immunodeficiency virus [HIV] disease: Secondary | ICD-10-CM

## 2014-06-03 DIAGNOSIS — E1165 Type 2 diabetes mellitus with hyperglycemia: Secondary | ICD-10-CM

## 2014-06-03 DIAGNOSIS — E119 Type 2 diabetes mellitus without complications: Secondary | ICD-10-CM | POA: Diagnosis present

## 2014-06-03 LAB — CBC
HEMATOCRIT: 38.6 % — AB (ref 39.0–52.0)
HEMOGLOBIN: 12.9 g/dL — AB (ref 13.0–17.0)
MCH: 30 pg (ref 26.0–34.0)
MCHC: 33.4 g/dL (ref 30.0–36.0)
MCV: 89.8 fL (ref 78.0–100.0)
MPV: 11.8 fL (ref 8.6–12.4)
Platelets: 183 10*3/uL (ref 150–400)
RBC: 4.3 MIL/uL (ref 4.22–5.81)
RDW: 13.2 % (ref 11.5–15.5)
WBC: 3.6 10*3/uL — AB (ref 4.0–10.5)

## 2014-06-03 LAB — LIPID PANEL
Cholesterol: 224 mg/dL — ABNORMAL HIGH (ref 0–200)
HDL: 34 mg/dL — ABNORMAL LOW (ref >39)
LDL Cholesterol: 146 mg/dL — ABNORMAL HIGH (ref 0–99)
TRIGLYCERIDES: 221 mg/dL — AB (ref <150)
Total CHOL/HDL Ratio: 6.6 Ratio
VLDL: 44 mg/dL — ABNORMAL HIGH (ref 0–40)

## 2014-06-03 LAB — COMPREHENSIVE METABOLIC PANEL
ALK PHOS: 93 U/L (ref 39–117)
ALT: 18 U/L (ref 0–53)
AST: 16 U/L (ref 0–37)
Albumin: 4.1 g/dL (ref 3.5–5.2)
BUN: 19 mg/dL (ref 6–23)
CO2: 21 mEq/L (ref 19–32)
Calcium: 9 mg/dL (ref 8.4–10.5)
Chloride: 99 mEq/L (ref 96–112)
Creat: 1.18 mg/dL (ref 0.50–1.35)
GLUCOSE: 362 mg/dL — AB (ref 70–99)
POTASSIUM: 4.1 meq/L (ref 3.5–5.3)
Sodium: 131 mEq/L — ABNORMAL LOW (ref 135–145)
TOTAL PROTEIN: 7.4 g/dL (ref 6.0–8.3)
Total Bilirubin: 0.7 mg/dL (ref 0.2–1.2)

## 2014-06-03 LAB — POCT URINALYSIS DIPSTICK
BILIRUBIN UA: NEGATIVE
Glucose, UA: 500
KETONES UA: NEGATIVE
LEUKOCYTES UA: NEGATIVE
NITRITE UA: NEGATIVE
Protein, UA: 100
Spec Grav, UA: 1.01
Urobilinogen, UA: 1
pH, UA: 5.5

## 2014-06-03 LAB — GLUCOSE, POCT (MANUAL RESULT ENTRY)
POC Glucose: 327 mg/dl — AB (ref 70–99)
POC Glucose: 308 mg/dl — AB (ref 70–99)

## 2014-06-03 LAB — POCT GLYCOSYLATED HEMOGLOBIN (HGB A1C): Hemoglobin A1C: 10.6

## 2014-06-03 LAB — RPR

## 2014-06-03 MED ORDER — INSULIN ASPART 100 UNIT/ML ~~LOC~~ SOLN
10.0000 [IU] | Freq: Once | SUBCUTANEOUS | Status: AC
  Administered 2014-06-03: 10 [IU] via SUBCUTANEOUS

## 2014-06-03 NOTE — Progress Notes (Signed)
Patient walked in stating fasting blood sugar this AM 425 States fasting am sugars have been ranging 235-485 over last 10 days due to holiday eating C/o feeling tired for last 10 days as well States he has been taking metformin 500 mg bid instead of 1000 mg bid for the last year due to diarrhea with 1000mg . Only tried 1000 mg for 2 days about 1 year ago. Requesting new glucometer and supplies  CBG 327 HGB A1c 10.6 U/A:  Glucose 500  Ketones negative  10 units novolog insulin given SQ per PCP Patient has appt to f/u with PCP on 06/09/14 PCP will address changes at that time  Repeat CBG 308 30 minutes after insulin administration  Patient states he will be much more carb conscious going forward Patient given literature on Diabetes and Exercise and Diabetes and Food. Patient states he has been to classes for Diabetic Nutrition and Exercise   Agree with RN's assessment and intervention  Doris Cheadleeepak Advani, MD

## 2014-06-03 NOTE — Patient Instructions (Signed)
Diabetes Mellitus and Food It is important for you to manage your blood sugar (glucose) level. Your blood glucose level can be greatly affected by what you eat. Eating healthier foods in the appropriate amounts throughout the day at about the same time each day will help you control your blood glucose level. It can also help slow or prevent worsening of your diabetes mellitus. Healthy eating may even help you improve the level of your blood pressure and reach or maintain a healthy weight.  HOW CAN FOOD AFFECT ME? Carbohydrates Carbohydrates affect your blood glucose level more than any other type of food. Your dietitian will help you determine how many carbohydrates to eat at each meal and teach you how to count carbohydrates. Counting carbohydrates is important to keep your blood glucose at a healthy level, especially if you are using insulin or taking certain medicines for diabetes mellitus. Alcohol Alcohol can cause sudden decreases in blood glucose (hypoglycemia), especially if you use insulin or take certain medicines for diabetes mellitus. Hypoglycemia can be a life-threatening condition. Symptoms of hypoglycemia (sleepiness, dizziness, and disorientation) are similar to symptoms of having too much alcohol.  If your health care provider has given you approval to drink alcohol, do so in moderation and use the following guidelines:  Women should not have more than one drink per day, and men should not have more than two drinks per day. One drink is equal to:  12 oz of beer.  5 oz of wine.  1 oz of hard liquor.  Do not drink on an empty stomach.  Keep yourself hydrated. Have water, diet soda, or unsweetened iced tea.  Regular soda, juice, and other mixers might contain a lot of carbohydrates and should be counted. WHAT FOODS ARE NOT RECOMMENDED? As you make food choices, it is important to remember that all foods are not the same. Some foods have fewer nutrients per serving than other  foods, even though they might have the same number of calories or carbohydrates. It is difficult to get your body what it needs when you eat foods with fewer nutrients. Examples of foods that you should avoid that are high in calories and carbohydrates but low in nutrients include:  Trans fats (most processed foods list trans fats on the Nutrition Facts label).  Regular soda.  Juice.  Candy.  Sweets, such as cake, pie, doughnuts, and cookies.  Fried foods. WHAT FOODS CAN I EAT? Have nutrient-rich foods, which will nourish your body and keep you healthy. The food you should eat also will depend on several factors, including:  The calories you need.  The medicines you take.  Your weight.  Your blood glucose level.  Your blood pressure level.  Your cholesterol level. You also should eat a variety of foods, including:  Protein, such as meat, poultry, fish, tofu, nuts, and seeds (lean animal proteins are best).  Fruits.  Vegetables.  Dairy products, such as milk, cheese, and yogurt (low fat is best).  Breads, grains, pasta, cereal, rice, and beans.  Fats such as olive oil, trans fat-free margarine, canola oil, avocado, and olives. DOES EVERYONE WITH DIABETES MELLITUS HAVE THE SAME MEAL PLAN? Because every person with diabetes mellitus is different, there is not one meal plan that works for everyone. It is very important that you meet with a dietitian who will help you create a meal plan that is just right for you. Document Released: 02/10/2005 Document Revised: 05/21/2013 Document Reviewed: 04/12/2013 Exit[employer] Patient Information 2015 [employer]. This   information is not intended to replace advice given to you by your health care provider. Make sure you discuss any questions you have with your health care provider. Diabetes and Exercise Exercising regularly is important. It is not just about losing weight. It has many health benefits, such as:  Improving your overall  fitness, flexibility, and endurance.  Increasing your bone density.  Helping with weight control.  Decreasing your body fat.  Increasing your muscle strength.  Reducing stress and tension.  Improving your overall health. People with diabetes who exercise gain additional benefits because exercise:  Reduces appetite.  Improves the body's use of blood sugar (glucose).  Helps lower or control blood glucose.  Decreases blood pressure.  Helps control blood lipids (such as cholesterol and triglycerides).  Improves the body's use of the hormone insulin by:  Increasing the body's insulin sensitivity.  Reducing the body's insulin needs.  Decreases the risk for heart disease because exercising:  Lowers cholesterol and triglycerides levels.  Increases the levels of good cholesterol (such as high-density lipoproteins [HDL]) in the body.  Lowers blood glucose levels. YOUR ACTIVITY PLAN  Choose an activity that you enjoy and set realistic goals. Your health care provider or diabetes educator can help you make an activity plan that works for you. Exercise regularly as directed by your health care provider. This includes:  Performing resistance training twice a week such as push-ups, sit-ups, lifting weights, or using resistance bands.  Performing 150 minutes of cardio exercises each week such as walking, running, or playing sports.  Staying active and spending no more than 90 minutes at one time being inactive. Even short bursts of exercise are good for you. Three 10-minute sessions spread throughout the day are just as beneficial as a single 30-minute session. Some exercise ideas include:  Taking the dog for a walk.  Taking the stairs instead of the elevator.  Dancing to your favorite song.  Doing an exercise video.  Doing your favorite exercise with a friend. RECOMMENDATIONS FOR EXERCISING WITH TYPE 1 OR TYPE 2 DIABETES   Check your blood glucose before exercising. If  blood glucose levels are greater than 240 mg/dL, check for urine ketones. Do not exercise if ketones are present.  Avoid injecting insulin into areas of the body that are going to be exercised. For example, avoid injecting insulin into:  The arms when playing tennis.  The legs when jogging.  Keep a record of:  Food intake before and after you exercise.  Expected peak times of insulin action.  Blood glucose levels before and after you exercise.  The type and amount of exercise you have done.  Review your records with your health care provider. Your health care provider will help you to develop guidelines for adjusting food intake and insulin amounts before and after exercising.  If you take insulin or oral hypoglycemic agents, watch for signs and symptoms of hypoglycemia. They include:  Dizziness.  Shaking.  Sweating.  Chills.  Confusion.  Drink plenty of water while you exercise to prevent dehydration or heat stroke. Body water is lost during exercise and must be replaced.  Talk to your health care provider before starting an exercise program to make sure it is safe for you. Remember, almost any type of activity is better than none. Document Released: 08/06/2003 Document Revised: 09/30/2013 Document Reviewed: 10/23/2012 Exit[employer] Patient Information 2015 [employer]. This information is not intended to replace advice given to you by your health care provider. Make sure you discuss any   questions you have with your health care provider.  

## 2014-06-04 ENCOUNTER — Other Ambulatory Visit: Payer: Self-pay

## 2014-06-04 LAB — T-HELPER CELL (CD4) - (RCID CLINIC ONLY)
CD4 % Helper T Cell: 37 % (ref 33–55)
CD4 T Cell Abs: 410 /uL (ref 400–2700)

## 2014-06-04 LAB — HIV-1 RNA QUANT-NO REFLEX-BLD: HIV 1 RNA Quant: <20 copies/mL (ref <20)

## 2014-06-06 ENCOUNTER — Other Ambulatory Visit: Payer: Self-pay | Admitting: *Deleted

## 2014-06-06 MED ORDER — GLUCOSE BLOOD VI STRP: ORAL_STRIP | Status: DC

## 2014-06-06 MED ORDER — ACCU-CHEK SOFTCLIX LANCET DEV MISC: Status: DC

## 2014-06-06 MED ORDER — ACCU-CHEK AVIVA PLUS W/DEVICE KIT: 1000.0000 mg | PACK | Freq: Two times a day (BID) | Status: DC

## 2014-06-09 ENCOUNTER — Encounter: Payer: Self-pay | Admitting: Internal Medicine

## 2014-06-09 ENCOUNTER — Ambulatory Visit: Payer: Medicare Other | Attending: Internal Medicine | Admitting: Internal Medicine

## 2014-06-09 VITALS — BP 133/66 | HR 86 | Temp 98.0°F | Resp 16 | Wt 194.0 lb

## 2014-06-09 DIAGNOSIS — Z791 Long term (current) use of non-steroidal anti-inflammatories (NSAID): Secondary | ICD-10-CM | POA: Insufficient documentation

## 2014-06-09 DIAGNOSIS — Z7982 Long term (current) use of aspirin: Secondary | ICD-10-CM | POA: Diagnosis not present

## 2014-06-09 DIAGNOSIS — I10 Essential (primary) hypertension: Secondary | ICD-10-CM | POA: Diagnosis not present

## 2014-06-09 DIAGNOSIS — B20 Human immunodeficiency virus [HIV] disease: Secondary | ICD-10-CM | POA: Insufficient documentation

## 2014-06-09 DIAGNOSIS — E1165 Type 2 diabetes mellitus with hyperglycemia: Secondary | ICD-10-CM | POA: Insufficient documentation

## 2014-06-09 DIAGNOSIS — E138 Other specified diabetes mellitus with unspecified complications: Secondary | ICD-10-CM

## 2014-06-09 DIAGNOSIS — E785 Hyperlipidemia, unspecified: Secondary | ICD-10-CM | POA: Insufficient documentation

## 2014-06-09 DIAGNOSIS — Z794 Long term (current) use of insulin: Secondary | ICD-10-CM | POA: Diagnosis not present

## 2014-06-09 LAB — POCT URINALYSIS DIPSTICK
Bilirubin, UA: NEGATIVE
GLUCOSE UA: 500
KETONES UA: NEGATIVE
LEUKOCYTES UA: NEGATIVE
NITRITE UA: NEGATIVE
PROTEIN UA: NEGATIVE
Urobilinogen, UA: 0.2
pH, UA: 5.5

## 2014-06-09 LAB — GLUCOSE, POCT (MANUAL RESULT ENTRY)
POC Glucose: 467 mg/dl — AB (ref 70–99)
POC Glucose: 397 mg/dl — AB (ref 70–99)

## 2014-06-09 MED ORDER — GLUCOSE BLOOD VI STRP: ORAL_STRIP | Status: DC

## 2014-06-09 MED ORDER — FREESTYLE SYSTEM KIT: 1.0000 | PACK | Status: DC | PRN

## 2014-06-09 MED ORDER — INSULIN ASPART 100 UNIT/ML ~~LOC~~ SOLN
20.0000 [IU] | Freq: Once | SUBCUTANEOUS | Status: AC
  Administered 2014-06-09: 20 [IU] via SUBCUTANEOUS

## 2014-06-09 MED ORDER — INSULIN PEN NEEDLE 31G X 8 MM MISC: Status: DC

## 2014-06-09 MED ORDER — GLUCOCOM LANCETS 28G MISC: Status: DC

## 2014-06-09 MED ORDER — INSULIN GLARGINE 100 UNIT/ML SOLOSTAR PEN: 10.0000 [IU] | PEN_INJECTOR | Freq: Every day | SUBCUTANEOUS | Status: DC

## 2014-06-09 NOTE — Progress Notes (Signed)
Patient here for follow up on his diabetes Presents today with elevated blood sugar Patient states he did have some soda this am

## 2014-06-09 NOTE — Progress Notes (Signed)
MRN: [id number] Name: Benjamin Hunter  Sex: male Age: 54 y.o. DOB: 23-Aug-1960  Allergies: Lisinopril  Chief Complaint  Patient presents with  . Follow-up    HPI: Patient is 54 y.o. male who has to of diabetes hypertension hyperlipidemia HIV following up with infectious disease Dr., comes today reported to have recently elevated blood sugar level, patient was also not taking full dose of metformin and recently started back, noticed his hemoglobin A1c has trended up, as per patient his fasting blood sugar is usually more than 200 mg/dL, Patient was given insulin and his blood sugar improved, denies any acute symptoms.  Past Medical History  Diagnosis Date  . Diabetes mellitus   . HIV (human immunodeficiency virus infection)   . Hyperlipidemia   . Hypertension   . Arthritis   . Depression     History reviewed. No pertinent past surgical history.    Medication List       This list is accurate as of: 06/09/14  1:16 PM.  Always use your most recent med list.               ACCU-CHEK AVIVA PLUS W/DEVICE Kit  1,000 mg by Does not apply route 2 (two) times daily.     accu-chek softclix lancets  Use as instructed     amLODipine 5 MG tablet  Commonly known as:  NORVASC  Take 5 mg by mouth daily.     aspirin 81 MG EC tablet  Take 1 tablet (81 mg total) by mouth daily. Swallow whole.     atorvastatin 40 MG tablet  Commonly known as:  LIPITOR  Take 1 tablet (40 mg total) by mouth daily.     cyclobenzaprine 5 MG tablet  Commonly known as:  FLEXERIL  Take 1 tablet (5 mg total) by mouth 3 (three) times daily as needed for muscle spasms.     elvitegravir-cobicistat-emtricitabine-tenofovir 150-150-200-300 MG Tabs tablet  Commonly known as:  STRIBILD  Take 1 tablet by mouth daily with breakfast.     glipiZIDE 10 MG tablet  Commonly known as:  GLUCOTROL  Take 1 tablet (10 mg total) by mouth daily.     glucose blood test strip  Commonly known as:  ACCU-CHEK AVIVA    Use as instructed     HYDROcodone-acetaminophen 5-325 MG per tablet  Commonly known as:  NORCO/VICODIN  1 to 2 tabs every 4 to 6 hours as needed for pain.     Insulin Glargine 100 UNIT/ML Solostar Pen  Commonly known as:  LANTUS  Inject 10 Units into the skin daily at 10 pm.     meloxicam 15 MG tablet  Commonly known as:  MOBIC  Take 1 tablet (15 mg total) by mouth daily.     metFORMIN 1000 MG tablet  Commonly known as:  GLUCOPHAGE  Take 1 tablet (1,000 mg total) by mouth 2 (two) times daily with a meal.        Meds ordered this encounter  Medications  . insulin aspart (novoLOG) injection 20 Units    Sig:   . Insulin Glargine (LANTUS) 100 UNIT/ML Solostar Pen    Sig: Inject 10 Units into the skin daily at 10 pm.    Dispense:  15 mL    Refill:  3    Immunization History  Administered Date(s) Administered  . Hepatitis B 08/22/2002, 10/13/2002, 03/17/2003  . Influenza Split 04/25/2011, 02/16/2012  . Influenza Whole 03/06/2008, 03/16/2009, 01/25/2010  . Influenza,inj,Quad PF,36+ Mos 05/02/2013, 02/28/2014  .  Pneumococcal Polysaccharide-23 11/26/2007, 10/11/2012    Family History  Problem Relation Age of Onset  . Hypertension Mother     History  Substance Use Topics  . Smoking status: Never Smoker   . Smokeless tobacco: Never Used  . Alcohol Use: Yes    Review of Systems   As noted in HPI  Filed Vitals:   06/09/14 1120  BP: 133/66  Pulse: 86  Temp: 98 F (36.7 C)  Resp: 16    Physical Exam  Physical Exam  Constitutional: No distress.  Eyes: EOM are normal. Pupils are equal, round, and reactive to light.  Cardiovascular: Normal rate and regular rhythm.   Pulmonary/Chest: Breath sounds normal. No respiratory distress. He has no wheezes. He has no rales.  Musculoskeletal: He exhibits no edema.    CBC    Component Value Date/Time   WBC 3.6* 06/03/2014 1152   RBC 4.30 06/03/2014 1152   HGB 12.9* 06/03/2014 1152   HCT 38.6* 06/03/2014 1152    PLT 183 06/03/2014 1152   MCV 89.8 06/03/2014 1152   LYMPHSABS 1.2 08/10/2012 1037   MONOABS 0.2 08/10/2012 1037   EOSABS 0.2 08/10/2012 1037   BASOSABS 0.0 08/10/2012 1037    CMP     Component Value Date/Time   NA 131* 06/03/2014 1152   K 4.1 06/03/2014 1152   CL 99 06/03/2014 1152   CO2 21 06/03/2014 1152   GLUCOSE 362* 06/03/2014 1152   BUN 19 06/03/2014 1152   CREATININE 1.18 06/03/2014 1152   CREATININE 1.02 12/08/2013 1655   CALCIUM 9.0 06/03/2014 1152   PROT 7.4 06/03/2014 1152   ALBUMIN 4.1 06/03/2014 1152   AST 16 06/03/2014 1152   ALT 18 06/03/2014 1152   ALKPHOS 93 06/03/2014 1152   BILITOT 0.7 06/03/2014 1152   GFRNONAA 81* 12/08/2013 1655   GFRNONAA 73 11/20/2013 0948   GFRAA >90 12/08/2013 1655   GFRAA 84 11/20/2013 0948    Lab Results  Component Value Date/Time   CHOL 224* 06/03/2014 11:52 AM    No components found for: HGA1C  Lab Results  Component Value Date/Time   AST 16 06/03/2014 11:52 AM    Assessment and Plan  Other specified diabetes mellitus with unspecified complications - Plan:  Results for orders placed or performed in visit on 06/09/14  Urinalysis Dipstick  Result Value Ref Range   Color, UA yellow    Clarity, UA clear    Glucose, UA 500    Bilirubin, UA neg    Ketones, UA neg    Spec Grav, UA <=1.005    Blood, UA trace    pH, UA 5.5    Protein, UA neg    Urobilinogen, UA 0.2    Nitrite, UA neg    Leukocytes, UA Negative   Glucose (CBG)  Result Value Ref Range   POC Glucose 467 (A) 70 - 99 mg/dl  Glucose (CBG)  Result Value Ref Range   POC Glucose 397 (A) 70 - 99 mg/dl   Patient has hyperglycemia,Urinalysis Dipstick negative for ketones, he is given insulin aspart (novoLOG) injection 20 Units, Glucose (CBG), have advised patient for diabetes meal planning, he will continue with metformin and Glucotrol, I have started patient on Lantus,Insulin Glargine (LANTUS) 100 UNIT/ML Solostar Pen, patient will come back in 2 weeks  for nurse visit/Glucose (CBG)check  Dyslipidemia As per patient recently when he had a blood work done he was not fasting, will get fasting lipid panel on next visit, continue with Lipitor 40  mg daily.  Essential hypertension Blood pressure is well controlled, continue with current meds and DASH diet.   Health Maintenance  -Vaccinations:  Up-to-date with flu shot and Pneumovax  Return in about 3 months (around 09/08/2014) for diabetes,  CBG check in 2 weeks/Nurse Visit.  Lorayne Marek, MD

## 2014-06-09 NOTE — Patient Instructions (Signed)
Fat and Cholesterol Control Diet Fat and cholesterol levels in your blood and organs are influenced by your diet. High levels of fat and cholesterol may lead to diseases of the heart, small and large blood vessels, gallbladder, liver, and pancreas. CONTROLLING FAT AND CHOLESTEROL WITH DIET Although exercise and lifestyle factors are important, your diet is key. That is because certain foods are known to raise cholesterol and others to lower it. The goal is to balance foods for their effect on cholesterol and more importantly, to replace saturated and trans fat with other types of fat, such as monounsaturated fat, polyunsaturated fat, and omega-3 fatty acids. On average, a person should consume no more than 15 to 17 g of saturated fat daily. Saturated and trans fats are considered "bad" fats, and they will raise LDL cholesterol. Saturated fats are primarily found in animal products such as meats, butter, and cream. However, that does not mean you need to give up all your favorite foods. Today, there are good tasting, low-fat, low-cholesterol substitutes for most of the things you like to eat. Choose low-fat or nonfat alternatives. Choose round or loin cuts of red meat. These types of cuts are lowest in fat and cholesterol. Chicken (without the skin), fish, veal, and ground turkey breast are great choices. Eliminate fatty meats, such as hot dogs and salami. Even shellfish have little or no saturated fat. Have a 3 oz (85 g) portion when you eat lean meat, poultry, or fish. Trans fats are also called "partially hydrogenated oils." They are oils that have been scientifically manipulated so that they are solid at room temperature resulting in a longer shelf life and improved taste and texture of foods in which they are added. Trans fats are found in stick margarine, some tub margarines, cookies, crackers, and baked goods.  When baking and cooking, oils are a great substitute for butter. The monounsaturated oils are  especially beneficial since it is believed they lower LDL and raise HDL. The oils you should avoid entirely are saturated tropical oils, such as coconut and palm.  Remember to eat a lot from food groups that are naturally free of saturated and trans fat, including fish, fruit, vegetables, beans, grains (barley, rice, couscous, bulgur wheat), and pasta (without cream sauces).  IDENTIFYING FOODS THAT LOWER FAT AND CHOLESTEROL  Soluble fiber may lower your cholesterol. This type of fiber is found in fruits such as apples, vegetables such as broccoli, potatoes, and carrots, legumes such as beans, peas, and lentils, and grains such as barley. Foods fortified with plant sterols (phytosterol) may also lower cholesterol. You should eat at least 2 g per day of these foods for a cholesterol lowering effect.  Read package labels to identify low-saturated fats, trans fat free, and low-fat foods at the supermarket. Select cheeses that have only 2 to 3 g saturated fat per ounce. Use a heart-healthy tub margarine that is free of trans fats or partially hydrogenated oil. When buying baked goods (cookies, crackers), avoid partially hydrogenated oils. Breads and muffins should be made from whole grains (whole-wheat or whole oat flour, instead of "flour" or "enriched flour"). Buy non-creamy canned soups with reduced salt and no added fats.  FOOD PREPARATION TECHNIQUES  Never deep-fry. If you must fry, either stir-fry, which uses very little fat, or use non-stick cooking sprays. When possible, broil, bake, or roast meats, and steam vegetables. Instead of putting butter or margarine on vegetables, use lemon and herbs, applesauce, and cinnamon (for squash and sweet potatoes). Use nonfat   yogurt, salsa, and low-fat dressings for salads.  LOW-SATURATED FAT / LOW-FAT FOOD SUBSTITUTES Meats / Saturated Fat (g)  Avoid: Steak, marbled (3 oz/85 g) / 11 g  Choose: Steak, lean (3 oz/85 g) / 4 g  Avoid: Hamburger (3 oz/85 g) / 7  g  Choose: Hamburger, lean (3 oz/85 g) / 5 g  Avoid: Ham (3 oz/85 g) / 6 g  Choose: Ham, lean cut (3 oz/85 g) / 2.4 g  Avoid: Chicken, with skin, dark meat (3 oz/85 g) / 4 g  Choose: Chicken, skin removed, dark meat (3 oz/85 g) / 2 g  Avoid: Chicken, with skin, light meat (3 oz/85 g) / 2.5 g  Choose: Chicken, skin removed, light meat (3 oz/85 g) / 1 g Dairy / Saturated Fat (g)  Avoid: Whole milk (1 cup) / 5 g  Choose: Low-fat milk, 2% (1 cup) / 3 g  Choose: Low-fat milk, 1% (1 cup) / 1.5 g  Choose: Skim milk (1 cup) / 0.3 g  Avoid: Hard cheese (1 oz/28 g) / 6 g  Choose: Skim milk cheese (1 oz/28 g) / 2 to 3 g  Avoid: Cottage cheese, 4% fat (1 cup) / 6.5 g  Choose: Low-fat cottage cheese, 1% fat (1 cup) / 1.5 g  Avoid: Ice cream (1 cup) / 9 g  Choose: Sherbet (1 cup) / 2.5 g  Choose: Nonfat frozen yogurt (1 cup) / 0.3 g  Choose: Frozen fruit bar / trace  Avoid: Whipped cream (1 tbs) / 3.5 g  Choose: Nondairy whipped topping (1 tbs) / 1 g Condiments / Saturated Fat (g)  Avoid: Mayonnaise (1 tbs) / 2 g  Choose: Low-fat mayonnaise (1 tbs) / 1 g  Avoid: Butter (1 tbs) / 7 g  Choose: Extra light margarine (1 tbs) / 1 g  Avoid: Coconut oil (1 tbs) / 11.8 g  Choose: Olive oil (1 tbs) / 1.8 g  Choose: Corn oil (1 tbs) / 1.7 g  Choose: Safflower oil (1 tbs) / 1.2 g  Choose: Sunflower oil (1 tbs) / 1.4 g  Choose: Soybean oil (1 tbs) / 2.4 g  Choose: Canola oil (1 tbs) / 1 g Document Released: 05/16/2005 Document Revised: 09/10/2012 Document Reviewed: 08/14/2013 Exit[employer] Patient Information 2015 [employer]. This information is not intended to replace advice given to you by your health care provider. Make sure you discuss any questions you have with your health care provider. Diabetes Mellitus and Food It is important for you to manage your blood sugar (glucose) level. Your blood glucose level can be greatly affected by what you eat. Eating healthier  foods in the appropriate amounts throughout the day at about the same time each day will help you control your blood glucose level. It can also help slow or prevent worsening of your diabetes mellitus. Healthy eating may even help you improve the level of your blood pressure and reach or maintain a healthy weight.  HOW CAN FOOD AFFECT ME? Carbohydrates Carbohydrates affect your blood glucose level more than any other type of food. Your dietitian will help you determine how many carbohydrates to eat at each meal and teach you how to count carbohydrates. Counting carbohydrates is important to keep your blood glucose at a healthy level, especially if you are using insulin or taking certain medicines for diabetes mellitus. Alcohol Alcohol can cause sudden decreases in blood glucose (hypoglycemia), especially if you use insulin or take certain medicines for diabetes mellitus. Hypoglycemia can be a life-threatening   condition. Symptoms of hypoglycemia (sleepiness, dizziness, and disorientation) are similar to symptoms of having too much alcohol.  If your health care provider has given you approval to drink alcohol, do so in moderation and use the following guidelines:  Women should not have more than one drink per day, and men should not have more than two drinks per day. One drink is equal to:  12 oz of beer.  5 oz of wine.  1 oz of hard liquor.  Do not drink on an empty stomach.  Keep yourself hydrated. Have water, diet soda, or unsweetened iced tea.  Regular soda, juice, and other mixers might contain a lot of carbohydrates and should be counted. WHAT FOODS ARE NOT RECOMMENDED? As you make food choices, it is important to remember that all foods are not the same. Some foods have fewer nutrients per serving than other foods, even though they might have the same number of calories or carbohydrates. It is difficult to get your body what it needs when you eat foods with fewer nutrients. Examples of  foods that you should avoid that are high in calories and carbohydrates but low in nutrients include:  Trans fats (most processed foods list trans fats on the Nutrition Facts label).  Regular soda.  Juice.  Candy.  Sweets, such as cake, pie, doughnuts, and cookies.  Fried foods. WHAT FOODS CAN I EAT? Have nutrient-rich foods, which will nourish your body and keep you healthy. The food you should eat also will depend on several factors, including:  The calories you need.  The medicines you take.  Your weight.  Your blood glucose level.  Your blood pressure level.  Your cholesterol level. You also should eat a variety of foods, including:  Protein, such as meat, poultry, fish, tofu, nuts, and seeds (lean animal proteins are best).  Fruits.  Vegetables.  Dairy products, such as milk, cheese, and yogurt (low fat is best).  Breads, grains, pasta, cereal, rice, and beans.  Fats such as olive oil, trans fat-free margarine, canola oil, avocado, and olives. DOES EVERYONE WITH DIABETES MELLITUS HAVE THE SAME MEAL PLAN? Because every person with diabetes mellitus is different, there is not one meal plan that works for everyone. It is very important that you meet with a dietitian who will help you create a meal plan that is just right for you. Document Released: 02/10/2005 Document Revised: 05/21/2013 Document Reviewed: 04/12/2013 Exit[employer] Patient Information 2015 [employer]. This information is not intended to replace advice given to you by your health care provider. Make sure you discuss any questions you have with your health care provider.  

## 2014-06-10 ENCOUNTER — Telehealth: Payer: Self-pay | Admitting: Internal Medicine

## 2014-06-10 NOTE — Telephone Encounter (Signed)
Patient would like to speak to nurse about his glucose monitor, patient states that he was not able to get his monitor due to his insurance. Please f/u with pt.

## 2014-06-11 ENCOUNTER — Encounter (HOSPITAL_COMMUNITY): Payer: Self-pay

## 2014-06-11 ENCOUNTER — Emergency Department (HOSPITAL_COMMUNITY)
Admission: EM | Admit: 2014-06-11 | Discharge: 2014-06-11 | Disposition: A | Discharge status: Home / Self Care | Payer: Medicare Other | Source: Home / Self Care | Attending: Emergency Medicine | Admitting: Emergency Medicine

## 2014-06-11 DIAGNOSIS — J019 Acute sinusitis, unspecified: Secondary | ICD-10-CM | POA: Diagnosis not present

## 2014-06-11 LAB — POCT RAPID STREP A: Streptococcus, Group A Screen (Direct): NEGATIVE

## 2014-06-11 MED ORDER — TRAMADOL HCL 50 MG PO TABS: ORAL_TABLET | ORAL | Status: DC

## 2014-06-11 MED ORDER — CEFDINIR 300 MG PO CAPS: 300.0000 mg | ORAL_CAPSULE | Freq: Two times a day (BID) | ORAL | Status: DC

## 2014-06-11 NOTE — ED Notes (Signed)
C/o sincus congestion and sore throat x couple of days. NAD. States his secretions are clear/white

## 2014-06-11 NOTE — ED Provider Notes (Signed)
   Chief Complaint   Sore Throat   History of Present Illness   Benjamin Hunter is a 54 year old HIV-positive, diabetic patient was had one-week history of sore throat, postnasal drip, nasal congestion with yellow drainage, and cough productive of clear sputum. He denies any fever, chills, sweats, headache, ear pain, stiff neck, difficulty breathing, chest pain, or GI symptoms.  Review of Systems   Other than as noted above, the patient denies any of the following symptoms: Systemic:  No fevers, chills, sweats, or myalgias. Eye:  No redness or discharge. ENT:  No ear pain, headache, nasal congestion, drainage, sinus pressure, or sore throat. Neck:  No neck pain, stiffness, or swollen glands. Lungs:  No cough, sputum production, hemoptysis, wheezing, chest tightness, shortness of breath or chest pain. GI:  No abdominal pain, nausea, vomiting or diarrhea.  PMFSH   Past medical history, family history, social history, meds, and allergies were reviewed. He is on Stribild for his HIV infection. His last CD4 count, just 8 days ago was 410, and his viral titer was 20. He also has diabetes, hypertension, and hyperlipidemia. He currently takes amlodipine, aspirin, Lipitor, glipizide, Lantus, and metformin. He is allergic to lisinopril.  Physical exam   Vital signs:  BP 132/77 mmHg  Pulse 89  Temp(Src) 98.1 F (36.7 C) (Oral)  Resp 16  SpO2 97% General:  Alert and oriented.  In no distress.  Skin warm and dry. Eye:  No conjunctival injection or drainage. Lids were normal. ENT:  TMs and canals were normal, without erythema or inflammation.  Nasal mucosa was clear and uncongested, without drainage.  Mucous membranes were moist.  Pharynx was clear with no exudate or drainage.  There were no oral ulcerations or lesions. Neck:  Supple, no adenopathy, tenderness or mass. Lungs:  No respiratory distress.  Lungs were clear to auscultation, without wheezes, rales or rhonchi.  Breath sounds were  clear and equal bilaterally.  Heart:  Regular rhythm, without gallops, murmers or rubs. Skin:  Clear, warm, and dry, without rash or lesions.  Labs   Results for orders placed or performed during the hospital encounter of 06/11/14  POCT rapid strep A [facility])  Result Value Ref Range   Streptococcus, Group A Screen (Direct) NEGATIVE NEGATIVE    Assessment     The encounter diagnosis was Acute sinusitis, recurrence not specified, unspecified location.  Plan    1.  Meds:  The following meds were prescribed:   Discharge Medication List as of 06/11/2014  3:22 PM    START taking these medications   Details  cefdinir (OMNICEF) 300 MG capsule Take 1 capsule (300 mg total) by mouth 2 (two) times daily., Starting 06/11/2014, Until Discontinued, Normal    traMADol (ULTRAM) 50 MG tablet 1 to 2 tabs every 8 hours as needed for cough, Print        2.  Patient Education/Counseling:  The patient was given appropriate handouts, self care instructions, and instructed in symptomatic relief.  Instructed to get extra fluids and extra rest.    3.  Follow up:  The patient was told to follow up here if no better in 3 to 4 days, or sooner if becoming worse in any way, and given some red flag symptoms such as increasing fever, difficulty breathing, chest pain, or persistent vomiting which would prompt immediate return.       Reuben Likesavid C Brookelin Felber, MD 06/11/14 [phone]

## 2014-06-11 NOTE — Discharge Instructions (Signed)

## 2014-06-14 LAB — CULTURE, GROUP A STREP

## 2014-06-19 ENCOUNTER — Ambulatory Visit: Payer: Self-pay | Admitting: Internal Medicine

## 2014-07-08 ENCOUNTER — Ambulatory Visit: Payer: Self-pay | Admitting: Internal Medicine

## 2014-07-15 ENCOUNTER — Other Ambulatory Visit: Payer: Self-pay | Admitting: Internal Medicine

## 2014-07-17 ENCOUNTER — Other Ambulatory Visit: Payer: Self-pay | Admitting: Internal Medicine

## 2014-08-11 ENCOUNTER — Encounter: Payer: Self-pay | Admitting: Internal Medicine

## 2014-08-11 ENCOUNTER — Ambulatory Visit (INDEPENDENT_AMBULATORY_CARE_PROVIDER_SITE_OTHER): Payer: Medicare Other | Admitting: Internal Medicine

## 2014-08-11 DIAGNOSIS — B20 Human immunodeficiency virus [HIV] disease: Secondary | ICD-10-CM | POA: Diagnosis not present

## 2014-08-11 MED ORDER — ELVITEG-COBIC-EMTRICIT-TENOFAF 150-150-200-10 MG PO TABS: 1.0000 | ORAL_TABLET | Freq: Every day | ORAL | Status: DC

## 2014-08-11 NOTE — Progress Notes (Signed)
Patient ID: Benjamin Hunter, male   DOB: [birth year], 54 y.o.   MRN: [id number]          Patient Active Problem List   Diagnosis Date Noted  . Human immunodeficiency virus (HIV) disease 07/17/2008    Priority: High  . Seasonal allergies 09/20/2011    Priority: Medium  . Polyarthritis 09/06/2011    Priority: Medium  . DM 03/16/2009    Priority: Medium  . HYPERLIPIDEMIA 07/17/2008    Priority: Medium  . Hypertension 07/17/2008    Priority: Medium  . Diabetes 06/17/2013  . Asymptomatic HIV infection 06/17/2013  . Dyslipidemia 06/17/2013  . HTN (hypertension) 06/17/2013  . Depression 01/14/2013  . Rash and nonspecific skin eruption 10/11/2012  . Recurrent low back pain 10/11/2012  . Left facial numbness 08/20/2012    Patient's Medications  New Prescriptions   ELVITEGRAVIR-COBICISTAT-EMTRICITABINE-TENOFOVIR (GENVOYA) 150-150-200-10 MG TABS TABLET    Take 1 tablet by mouth daily with breakfast.  Previous Medications   AMLODIPINE (NORVASC) 5 MG TABLET    Take 5 mg by mouth daily.   ASPIRIN 81 MG EC TABLET    Take 1 tablet (81 mg total) by mouth daily. Swallow whole.   ATORVASTATIN (LIPITOR) 40 MG TABLET    Take 1 tablet (40 mg total) by mouth daily.   BLOOD GLUCOSE MONITORING SUPPL (ACCU-CHEK AVIVA PLUS) W/DEVICE KIT    1,000 mg by Does not apply route 2 (two) times daily.   CYCLOBENZAPRINE (FLEXERIL) 5 MG TABLET    Take 1 tablet (5 mg total) by mouth 3 (three) times daily as needed for muscle spasms.   GLIPIZIDE (GLUCOTROL) 10 MG TABLET    Take 1 tablet (10 mg total) by mouth daily.   GLIPIZIDE (GLUCOTROL) 10 MG TABLET    TAKE 1 TABLET BY MOUTH DAILY   GLUCOCOM LANCETS MISC    Check blood sugar TID & QHS diagnosis 250.00   GLUCOSE BLOOD (ACCU-CHEK AVIVA) TEST STRIP    Use as instructed   GLUCOSE BLOOD (CHOICE DM FORA G20 TEST STRIPS) TEST STRIP    Use as instructed   GLUCOSE MONITORING KIT (FREESTYLE) MONITORING KIT    1 each by Does not apply route as needed for other. Check  blood sugar TID & QHS diagnosis 250.00   HYDROCODONE-ACETAMINOPHEN (NORCO/VICODIN) 5-325 MG PER TABLET    1 to 2 tabs every 4 to 6 hours as needed for pain.   INSULIN GLARGINE (LANTUS) 100 UNIT/ML SOLOSTAR PEN    Inject 10 Units into the skin daily at 10 pm.   INSULIN PEN NEEDLE 31G X 8 MM MISC    Check blood sugar TID & QHS 250.00   LANCET DEVICES (ACCU-CHEK SOFTCLIX) LANCETS    Use as instructed   MELOXICAM (MOBIC) 15 MG TABLET    Take 1 tablet (15 mg total) by mouth daily.   METFORMIN (GLUCOPHAGE) 1000 MG TABLET    Take 1 tablet (1,000 mg total) by mouth 2 (two) times daily with a meal.   TRAMADOL (ULTRAM) 50 MG TABLET    1 to 2 tabs every 8 hours as needed for cough  Modified Medications   No medications on file  Discontinued Medications   CEFDINIR (OMNICEF) 300 MG CAPSULE    Take 1 capsule (300 mg total) by mouth 2 (two) times daily.   ELVITEGRAVIR-COBICISTAT-EMTRICITABINE-TENOFOVIR (STRIBILD) 150-150-200-300 MG TABS TABLET    Take 1 tablet by mouth daily with breakfast.    Subjective: Benjamin Hunter is in for his routine HIV follow-up visit. He  continues to take Stribild correctly and has not missed any doses. He tolerates it well. He still struggling to help care for his mother who has advancing dementia. He says that he is now on insulin but his blood sugars are still running in the 200-300 range daily. He does not get regular exercise. He's been concerned about a spot on his right heel and recently pulled a scab off. He has some pain in the bottom of his right foot when he is walking. He does not have a follow-up visit scheduled with his primary care provider.  Review of Systems: Constitutional: negative Eyes: negative Ears, nose, mouth, throat, and face: negative Respiratory: negative Cardiovascular: negative Gastrointestinal: negative Genitourinary:negative  Past Medical History  Diagnosis Date  . Diabetes mellitus   . HIV (human immunodeficiency virus infection)   . Hyperlipidemia    . Hypertension   . Arthritis   . Depression     History  Substance Use Topics  . Smoking status: Never Smoker   . Smokeless tobacco: Never Used  . Alcohol Use: Yes    Family History  Problem Relation Age of Onset  . Hypertension Mother     Allergies  Allergen Reactions  . Lisinopril Rash    Palmar rash     Objective: Temp: 97.2 F (36.2 C) (03/14 1340) Temp Source: Oral (03/14 1340) BP: 130/75 mmHg (03/14 1340) Pulse Rate: 85 (03/14 1340) Body mass index is 26.79 kg/(m^2).  General: He is well dressed and in good spirits Oral: No oropharyngeal lesions Skin: No rash Lungs: Clear Cor: Regular S1 and S2 with no murmurs Abdomen: Soft and nontender Joints and extremities: He has a very small, hyperpigmented macule on his right medial heel. There is no ulcer or other sign of infection. He has flat feet. His feet are warm and well perfused. Neuro: Alert with normal speech and conversation  Lab Results Lab Results  Component Value Date   WBC 3.6* 06/03/2014   HGB 12.9* 06/03/2014   HCT 38.6* 06/03/2014   MCV 89.8 06/03/2014   PLT 183 06/03/2014    Lab Results  Component Value Date   CREATININE 1.18 06/03/2014   BUN 19 06/03/2014   NA 131* 06/03/2014   K 4.1 06/03/2014   CL 99 06/03/2014   CO2 21 06/03/2014    Lab Results  Component Value Date   ALT 18 06/03/2014   AST 16 06/03/2014   ALKPHOS 93 06/03/2014   BILITOT 0.7 06/03/2014    Lab Results  Component Value Date   CHOL 224* 06/03/2014   HDL 34* 06/03/2014   LDLCALC 146* 06/03/2014   TRIG 221* 06/03/2014   CHOLHDL 6.6 06/03/2014    Lab Results HIV 1 RNA QUANT (copies/mL)  Date Value  06/03/2014 <20  11/20/2013 <20  07/31/2013 <20   CD4 T CELL ABS (/uL)  Date Value  06/03/2014 410  11/20/2013 1230  07/31/2013 670     Assessment: His HIV infection remains under excellent control. I will change him to the new preparation of Stribild called Genvoya.  He does not yet have optimal  glycemic and lipid control. He is under significant stress taking care of his mother and does not feel he is able to do everything he could to take care of himself. I encouraged him to try to get regular exercise and to set up a follow-up visit with his primary care provider soon.  I instructed him on safe and regular foot care. 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  Plan: 1. Change Stribild to Genvoya 2. Follow-up here after lab work in [place] months   Michel Bickers, MD [facility] [place] [phone] pager   [phone] cell 08/11/2014, 1:58 PM

## 2014-09-12 ENCOUNTER — Other Ambulatory Visit: Payer: Self-pay | Admitting: Internal Medicine

## 2014-10-07 ENCOUNTER — Other Ambulatory Visit: Payer: Medicare Other

## 2014-10-15 ENCOUNTER — Other Ambulatory Visit: Payer: Self-pay | Admitting: *Deleted

## 2014-10-15 DIAGNOSIS — B20 Human immunodeficiency virus [HIV] disease: Secondary | ICD-10-CM

## 2014-10-15 MED ORDER — ELVITEG-COBIC-EMTRICIT-TENOFAF 150-150-200-10 MG PO TABS: 1.0000 | ORAL_TABLET | Freq: Every day | ORAL | Status: DC

## 2014-10-15 NOTE — Telephone Encounter (Signed)
ADAP Application 

## 2014-11-10 ENCOUNTER — Encounter: Payer: Self-pay | Admitting: Internal Medicine

## 2014-11-10 ENCOUNTER — Ambulatory Visit: Payer: Medicare Other | Attending: Internal Medicine | Admitting: Internal Medicine

## 2014-11-10 VITALS — BP 134/85 | HR 78 | Temp 98.0°F | Resp 16 | Wt 200.0 lb

## 2014-11-10 DIAGNOSIS — Z794 Long term (current) use of insulin: Secondary | ICD-10-CM | POA: Insufficient documentation

## 2014-11-10 DIAGNOSIS — E139 Other specified diabetes mellitus without complications: Secondary | ICD-10-CM | POA: Insufficient documentation

## 2014-11-10 DIAGNOSIS — M25511 Pain in right shoulder: Secondary | ICD-10-CM | POA: Diagnosis not present

## 2014-11-10 DIAGNOSIS — E785 Hyperlipidemia, unspecified: Secondary | ICD-10-CM | POA: Diagnosis not present

## 2014-11-10 DIAGNOSIS — I10 Essential (primary) hypertension: Secondary | ICD-10-CM | POA: Diagnosis not present

## 2014-11-10 LAB — POCT GLYCOSYLATED HEMOGLOBIN (HGB A1C): Hemoglobin A1C: 6.8

## 2014-11-10 LAB — GLUCOSE, POCT (MANUAL RESULT ENTRY): POC Glucose: 156 mg/dl — AB (ref 70–99)

## 2014-11-10 MED ORDER — CYCLOBENZAPRINE HCL 5 MG PO TABS: 5.0000 mg | ORAL_TABLET | Freq: Every day | ORAL | Status: DC

## 2014-11-10 MED ORDER — METHYLPREDNISOLONE 4 MG PO TBPK: ORAL_TABLET | ORAL | Status: DC

## 2014-11-10 MED ORDER — TRAMADOL HCL 50 MG PO TABS: 50.0000 mg | ORAL_TABLET | Freq: Two times a day (BID) | ORAL | Status: DC | PRN

## 2014-11-10 NOTE — Progress Notes (Signed)
Patient complains of right shoulder pain and right elbow pain Patient states he hurt it about three weeks ago while helping his mom by lifting her Patient states at times you can hear a "pop" when he bends his elbow Patient states his right arm is very tender

## 2014-11-10 NOTE — Patient Instructions (Signed)
Diabetes Mellitus and Food It is important for you to manage your blood sugar (glucose) level. Your blood glucose level can be greatly affected by what you eat. Eating healthier foods in the appropriate amounts throughout the day at about the same time each day will help you control your blood glucose level. It can also help slow or prevent worsening of your diabetes mellitus. Healthy eating may even help you improve the level of your blood pressure and reach or maintain a healthy weight.  HOW CAN FOOD AFFECT ME? Carbohydrates Carbohydrates affect your blood glucose level more than any other type of food. Your dietitian will help you determine how many carbohydrates to eat at each meal and teach you how to count carbohydrates. Counting carbohydrates is important to keep your blood glucose at a healthy level, especially if you are using insulin or taking certain medicines for diabetes mellitus. Alcohol Alcohol can cause sudden decreases in blood glucose (hypoglycemia), especially if you use insulin or take certain medicines for diabetes mellitus. Hypoglycemia can be a life-threatening condition. Symptoms of hypoglycemia (sleepiness, dizziness, and disorientation) are similar to symptoms of having too much alcohol.  If your health care provider has given you approval to drink alcohol, do so in moderation and use the following guidelines:  Women should not have more than one drink per day, and men should not have more than two drinks per day. One drink is equal to:  12 oz of beer.  5 oz of wine.  1 oz of hard liquor.  Do not drink on an empty stomach.  Keep yourself hydrated. Have water, diet soda, or unsweetened iced tea.  Regular soda, juice, and other mixers might contain a lot of carbohydrates and should be counted. WHAT FOODS ARE NOT RECOMMENDED? As you make food choices, it is important to remember that all foods are not the same. Some foods have fewer nutrients per serving than other  foods, even though they might have the same number of calories or carbohydrates. It is difficult to get your body what it needs when you eat foods with fewer nutrients. Examples of foods that you should avoid that are high in calories and carbohydrates but low in nutrients include:  Trans fats (most processed foods list trans fats on the Nutrition Facts label).  Regular soda.  Juice.  Candy.  Sweets, such as cake, pie, doughnuts, and cookies.  Fried foods. WHAT FOODS CAN I EAT? Have nutrient-rich foods, which will nourish your body and keep you healthy. The food you should eat also will depend on several factors, including:  The calories you need.  The medicines you take.  Your weight.  Your blood glucose level.  Your blood pressure level.  Your cholesterol level. You also should eat a variety of foods, including:  Protein, such as meat, poultry, fish, tofu, nuts, and seeds (lean animal proteins are best).  Fruits.  Vegetables.  Dairy products, such as milk, cheese, and yogurt (low fat is best).  Breads, grains, pasta, cereal, rice, and beans.  Fats such as olive oil, trans fat-free margarine, canola oil, avocado, and olives. DOES EVERYONE WITH DIABETES MELLITUS HAVE THE SAME MEAL PLAN? Because every person with diabetes mellitus is different, there is not one meal plan that works for everyone. It is very important that you meet with a dietitian who will help you create a meal plan that is just right for you. Document Released: 02/10/2005 Document Revised: 05/21/2013 Document Reviewed: 04/12/2013 Exit[employer] Patient Information 2015 [employer]. This   information is not intended to replace advice given to you by your health care provider. Make sure you discuss any questions you have with your health care provider.  

## 2014-11-10 NOTE — Progress Notes (Signed)
MRN: [id number] Name: Benjamin Hunter  Sex: male Age: 54 y.o. DOB: 10-27-1960  Allergies: Lisinopril  Chief Complaint  Patient presents with  . Shoulder Pain    HPI: Patient is 54 y.o. male who has history of hypertension diabetes hyperlipidemia, comes today for followup, on the last visit I started patient on Lantus 10 units and he continued Glucotrol and metformin, his hemoglobin A1c has trended down and is now less than 7%,patient denies any hypoglycemic symptoms as per patient is fasting sugar is usually more than 110 mg a deciliter sometimes up to 200 mg a deciliter, today he is also complaining of right shoulder pain which started 3 weeks ago as he was helping his mother and moving denies any recent fall, denies any fever chills.patient also has HIV and following up with ID.  Past Medical History  Diagnosis Date  . Diabetes mellitus   . HIV (human immunodeficiency virus infection)   . Hyperlipidemia   . Hypertension   . Arthritis   . Depression     History reviewed. No pertinent past surgical history.    Medication List       This list is accurate as of: 11/10/14  3:15 PM.  Always use your most recent med list.               ACCU-CHEK AVIVA PLUS W/DEVICE Kit  1,000 mg by Does not apply route 2 (two) times daily.     glucose monitoring kit monitoring kit  1 each by Does not apply route as needed for other. Check blood sugar TID & QHS diagnosis 250.00     accu-chek softclix lancets  Use as instructed     amLODipine 5 MG tablet  Commonly known as:  NORVASC  Take 5 mg by mouth daily.     aspirin 81 MG EC tablet  Take 1 tablet (81 mg total) by mouth daily. Swallow whole.     atorvastatin 40 MG tablet  Commonly known as:  LIPITOR  Take 1 tablet (40 mg total) by mouth daily.     cyclobenzaprine 5 MG tablet  Commonly known as:  FLEXERIL  Take 1 tablet (5 mg total) by mouth at bedtime.     elvitegravir-cobicistat-emtricitabine-tenofovir 150-150-200-10  MG Tabs tablet  Commonly known as:  GENVOYA  Take 1 tablet by mouth daily with breakfast.     glipiZIDE 10 MG tablet  Commonly known as:  GLUCOTROL  Take 1 tablet (10 mg total) by mouth daily.     glipiZIDE 10 MG tablet  Commonly known as:  GLUCOTROL  TAKE 1 TABLET BY MOUTH DAILY     GlucoCom Lancets Misc  Check blood sugar TID & QHS diagnosis 250.00     glucose blood test strip  Commonly known as:  ACCU-CHEK AVIVA  Use as instructed     glucose blood test strip  Commonly known as:  CHOICE DM FORA G20 TEST STRIPS  Use as instructed     HYDROcodone-acetaminophen 5-325 MG per tablet  Commonly known as:  NORCO/VICODIN  1 to 2 tabs every 4 to 6 hours as needed for pain.     Insulin Glargine 100 UNIT/ML Solostar Pen  Commonly known as:  LANTUS  Inject 10 Units into the skin daily at 10 pm.     Insulin Pen Needle 31G X 8 MM Misc  Check blood sugar TID & QHS 250.00     meloxicam 15 MG tablet  Commonly known as:  MOBIC  Take 1  tablet (15 mg total) by mouth daily.     metFORMIN 1000 MG tablet  Commonly known as:  GLUCOPHAGE  Take 1 tablet (1,000 mg total) by mouth 2 (two) times daily with a meal.     methylPREDNISolone 4 MG Tbpk tablet  Commonly known as:  MEDROL DOSEPAK  follow package directions     traMADol 50 MG tablet  Commonly known as:  ULTRAM  Take 1 tablet (50 mg total) by mouth every 12 (twelve) hours as needed for moderate pain.        Meds ordered this encounter  Medications  . cyclobenzaprine (FLEXERIL) 5 MG tablet    Sig: Take 1 tablet (5 mg total) by mouth at bedtime.    Dispense:  30 tablet    Refill:  1  . methylPREDNISolone (MEDROL DOSEPAK) 4 MG TBPK tablet    Sig: follow package directions    Dispense:  21 tablet    Refill:  0  . traMADol (ULTRAM) 50 MG tablet    Sig: Take 1 tablet (50 mg total) by mouth every 12 (twelve) hours as needed for moderate pain.    Dispense:  30 tablet    Refill:  0    Immunization History  Administered  Date(s) Administered  . Hepatitis B 08/22/2002, 10/13/2002, 03/17/2003  . Influenza Split 04/25/2011, 02/16/2012  . Influenza Whole 03/06/2008, 03/16/2009, 01/25/2010  . Influenza,inj,Quad PF,36+ Mos 05/02/2013, 02/28/2014  . Pneumococcal Polysaccharide-23 11/26/2007, 10/11/2012    Family History  Problem Relation Age of Onset  . Hypertension Mother     History  Substance Use Topics  . Smoking status: Never Smoker   . Smokeless tobacco: Never Used  . Alcohol Use: Yes    Review of Systems   As noted in HPI  Filed Vitals:   11/10/14 1431  BP: 134/85  Pulse: 78  Temp: 98 F (36.7 C)  Resp: 16    Physical Exam  Physical Exam  Constitutional: No distress.  Eyes: EOM are normal. Pupils are equal, round, and reactive to light.  Cardiovascular: Normal rate and regular rhythm.   Pulmonary/Chest: Breath sounds normal. No respiratory distress. He has no wheezes. He has no rales.  Musculoskeletal:  Right shoulder some tenderness anteriorly, some limitation in full range of motion strength both upper extremities is, DTR 2+    CBC    Component Value Date/Time   WBC 3.6* 06/03/2014 1152   RBC 4.30 06/03/2014 1152   HGB 12.9* 06/03/2014 1152   HCT 38.6* 06/03/2014 1152   PLT 183 06/03/2014 1152   MCV 89.8 06/03/2014 1152   LYMPHSABS 1.2 08/10/2012 1037   MONOABS 0.2 08/10/2012 1037   EOSABS 0.2 08/10/2012 1037   BASOSABS 0.0 08/10/2012 1037    CMP     Component Value Date/Time   NA 131* 06/03/2014 1152   K 4.1 06/03/2014 1152   CL 99 06/03/2014 1152   CO2 21 06/03/2014 1152   GLUCOSE 362* 06/03/2014 1152   BUN 19 06/03/2014 1152   CREATININE 1.18 06/03/2014 1152   CREATININE 1.02 12/08/2013 1655   CALCIUM 9.0 06/03/2014 1152   PROT 7.4 06/03/2014 1152   ALBUMIN 4.1 06/03/2014 1152   AST 16 06/03/2014 1152   ALT 18 06/03/2014 1152   ALKPHOS 93 06/03/2014 1152   BILITOT 0.7 06/03/2014 1152   GFRNONAA 81* 12/08/2013 1655   GFRNONAA 73 11/20/2013 0948    GFRAA >90 12/08/2013 1655   GFRAA 84 11/20/2013 0948    Lab Results  Component Value  Date/Time   CHOL 224* 06/03/2014 11:52 AM    Lab Results  Component Value Date/Time   HGBA1C 6.80 11/10/2014 02:32 PM   HGBA1C 5.3 12/31/2012 09:22 AM    Lab Results  Component Value Date/Time   AST 16 06/03/2014 11:52 AM    Assessment and Plan  Other specified diabetes mellitus without complications - Plan:  Results for orders placed or performed in visit on 11/10/14  Glucose (CBG)  Result Value Ref Range   POC Glucose 156.0 (A) 70 - 99 mg/dl  HgB A1c  Result Value Ref Range   Hemoglobin A1C 6.80     HgB A1c has trended down, patient will continue with metformin and Glucotrol, I have advised patient to increase 2 units of insulin now he will take Lantus 12 units each bedtime, diabetes meal planning, advise patient to keep the fingerstick log, repeat A1c in 3 months  Dyslipidemia Patient is currently on Lipitor, will need to check fasting lipid panel.  Essential hypertension Blood pressure is controlled continue with current meds  Right shoulder pain - Plan: cyclobenzaprine (FLEXERIL) 5 MG tablet, methylPREDNISolone (MEDROL DOSEPAK) 4 MG TBPK tablet, traMADol (ULTRAM) 50 MG tablet, If symptomatically not improved consider getting an x-ray and physical therapy.    Return in about 3 months (around 02/10/2015), or if symptoms worsen or fail to improve.   This note has been created with Surveyor, quantity. Any transcriptional errors are unintentional.    Lorayne Marek, MD

## 2014-11-24 ENCOUNTER — Other Ambulatory Visit: Payer: Self-pay

## 2014-12-03 ENCOUNTER — Ambulatory Visit: Payer: Medicare Other

## 2014-12-04 ENCOUNTER — Other Ambulatory Visit: Payer: Self-pay | Admitting: Licensed Clinical Social Worker

## 2014-12-04 DIAGNOSIS — B20 Human immunodeficiency virus [HIV] disease: Secondary | ICD-10-CM

## 2014-12-04 MED ORDER — ELVITEG-COBIC-EMTRICIT-TENOFAF 150-150-200-10 MG PO TABS: 1.0000 | ORAL_TABLET | Freq: Every day | ORAL | Status: DC

## 2014-12-28 ENCOUNTER — Other Ambulatory Visit: Payer: Self-pay | Admitting: Internal Medicine

## 2014-12-29 NOTE — Telephone Encounter (Signed)
Patient called to request a med refill for Metformin , please f/u with pt.

## 2014-12-30 ENCOUNTER — Other Ambulatory Visit: Payer: Self-pay | Admitting: *Deleted

## 2014-12-30 DIAGNOSIS — E785 Hyperlipidemia, unspecified: Secondary | ICD-10-CM

## 2014-12-30 NOTE — Telephone Encounter (Signed)
A question about the pt's Lipitor 40 mg and the Genvoya "popped up" when the refill was being done for the Lipitor.  Will send to [facility], Pharmacist, per Dr. Orvan Falconer for resolution.

## 2014-12-31 NOTE — Telephone Encounter (Signed)
Left pt a message that Genvoya refill is being worked on.  Spoke with the pharmacy to let them know we were working on Genvoya/Lipitor/DM medications.

## 2015-01-07 NOTE — Telephone Encounter (Signed)
Pt has not heard from Dr. Orpah Cobb about possible changes in cholesterol management.

## 2015-01-10 ENCOUNTER — Other Ambulatory Visit: Payer: Self-pay | Admitting: Internal Medicine

## 2015-01-13 ENCOUNTER — Telehealth: Payer: Self-pay | Admitting: *Deleted

## 2015-01-13 ENCOUNTER — Other Ambulatory Visit: Payer: Self-pay | Admitting: Pharmacist Clinician (PhC)/ Clinical Pharmacy Specialist

## 2015-01-13 DIAGNOSIS — E785 Hyperlipidemia, unspecified: Secondary | ICD-10-CM

## 2015-01-13 MED ORDER — ATORVASTATIN CALCIUM 20 MG PO TABS: 20.0000 mg | ORAL_TABLET | Freq: Every day | ORAL | Status: DC

## 2015-01-13 MED ORDER — EZETIMIBE 10 MG PO TABS: 10.0000 mg | ORAL_TABLET | Freq: Every day | ORAL | Status: DC

## 2015-01-13 MED ORDER — FENOFIBRATE 48 MG PO TABS: 48.0000 mg | ORAL_TABLET | Freq: Every day | ORAL | Status: DC

## 2015-01-13 NOTE — Progress Notes (Signed)
Benjamin Hunter has had some issue with his lipids so he is on Lipitor  for it. He was recently started on Genovoya so the cobicistat would be an issue with this dose. We are going to reduce his Lipitor to  qday and keep it there. We are also going to add zetia and tricor to help with his LDL and TG. Both Rxs have been sent to [facility]. Spoke to him directly on the phone and explained it to him.

## 2015-01-13 NOTE — Telephone Encounter (Signed)
Wanting clarification of Lipitor dose while taking ARV.  Dr. Orpah Cobb has not taken over prescribing Lipitor at this time.  Dr Orpah Cobb is prescribing DMII medications.

## 2015-01-14 ENCOUNTER — Other Ambulatory Visit: Payer: Self-pay | Admitting: Internal Medicine

## 2015-01-14 ENCOUNTER — Other Ambulatory Visit: Payer: Self-pay

## 2015-01-14 DIAGNOSIS — I10 Essential (primary) hypertension: Secondary | ICD-10-CM

## 2015-01-14 MED ORDER — AMLODIPINE BESYLATE 5 MG PO TABS: 5.0000 mg | ORAL_TABLET | Freq: Every day | ORAL | Status: DC

## 2015-01-16 ENCOUNTER — Telehealth: Payer: Self-pay | Admitting: *Deleted

## 2015-01-16 NOTE — Telephone Encounter (Signed)
Requesting name of OTC treatment for "Athletes foot."

## 2015-01-19 NOTE — Telephone Encounter (Signed)
Called Benjamin Hunter back about Athlete's foot treatment cream. Told him to go to [facility] to get a tube on clotrimazole cream and apply BID x 4 wks. He understood and will go get it.

## 2015-02-09 ENCOUNTER — Other Ambulatory Visit: Payer: Medicare Other

## 2015-02-10 ENCOUNTER — Other Ambulatory Visit: Payer: Medicare Other

## 2015-02-10 DIAGNOSIS — B20 Human immunodeficiency virus [HIV] disease: Secondary | ICD-10-CM

## 2015-02-10 DIAGNOSIS — E785 Hyperlipidemia, unspecified: Secondary | ICD-10-CM

## 2015-02-10 LAB — LIPID PANEL
Cholesterol: 211 mg/dL — ABNORMAL HIGH (ref 125–200)
HDL: 55 mg/dL (ref >=40)
LDL CALC: 99 mg/dL (ref <130)
Total CHOL/HDL Ratio: 3.8 Ratio (ref <=5.0)
Triglycerides: 287 mg/dL — ABNORMAL HIGH (ref <150)
VLDL: 57 mg/dL — ABNORMAL HIGH (ref <30)

## 2015-02-10 LAB — COMPREHENSIVE METABOLIC PANEL
ALK PHOS: 73 U/L (ref 40–115)
ALT: 11 U/L (ref 9–46)
AST: 15 U/L (ref 10–35)
Albumin: 4.1 g/dL (ref 3.6–5.1)
BUN: 20 mg/dL (ref 7–25)
CO2: 19 mmol/L — AB (ref 20–31)
CREATININE: 1.22 mg/dL (ref 0.70–1.33)
Calcium: 9.5 mg/dL (ref 8.6–10.3)
Chloride: 101 mmol/L (ref 98–110)
GLUCOSE: 280 mg/dL — AB (ref 65–99)
Potassium: 4.1 mmol/L (ref 3.5–5.3)
SODIUM: 134 mmol/L — AB (ref 135–146)
Total Bilirubin: 0.6 mg/dL (ref 0.2–1.2)
Total Protein: 7.4 g/dL (ref 6.1–8.1)

## 2015-02-11 LAB — T-HELPER CELL (CD4) - (RCID CLINIC ONLY)
CD4 T CELL HELPER: 39 % (ref 33–55)
CD4 T Cell Abs: 510 /uL (ref 400–2700)

## 2015-02-11 LAB — HIV-1 RNA QUANT-NO REFLEX-BLD: HIV-1 RNA Quant, Log: <1.3 {Log} (ref <1.30)

## 2015-02-17 ENCOUNTER — Other Ambulatory Visit: Payer: Self-pay | Admitting: *Deleted

## 2015-02-17 MED ORDER — ATORVASTATIN CALCIUM 20 MG PO TABS: 20.0000 mg | ORAL_TABLET | Freq: Every day | ORAL | Status: DC

## 2015-02-20 ENCOUNTER — Telehealth: Payer: Self-pay

## 2015-02-20 MED ORDER — FENOFIBRATE 48 MG PO TABS: 48.0000 mg | ORAL_TABLET | Freq: Every day | ORAL | Status: DC

## 2015-02-20 NOTE — Telephone Encounter (Signed)
Patient called stating he was having  A problem getting his fenofibrate refilled Prescription was written by Dr Orvan Falconer 01/13/15 with eleven refills Pharmacy is stating the prescription was closed for what ever reason New prescription faxed to [facility] on [address] Pharmacist is aware

## 2015-02-23 ENCOUNTER — Encounter: Payer: Self-pay | Admitting: Pharmacist Clinician (PhC)/ Clinical Pharmacy Specialist

## 2015-02-24 ENCOUNTER — Encounter: Payer: Self-pay | Admitting: Internal Medicine

## 2015-02-24 ENCOUNTER — Ambulatory Visit (INDEPENDENT_AMBULATORY_CARE_PROVIDER_SITE_OTHER): Payer: Medicare Other | Admitting: Internal Medicine

## 2015-02-24 VITALS — BP 109/77 | HR 80 | Temp 98.2°F | Wt 184.5 lb

## 2015-02-24 DIAGNOSIS — E785 Hyperlipidemia, unspecified: Secondary | ICD-10-CM

## 2015-02-24 DIAGNOSIS — Z79899 Other long term (current) drug therapy: Secondary | ICD-10-CM

## 2015-02-24 DIAGNOSIS — Z23 Encounter for immunization: Secondary | ICD-10-CM

## 2015-02-24 DIAGNOSIS — B20 Human immunodeficiency virus [HIV] disease: Secondary | ICD-10-CM | POA: Diagnosis not present

## 2015-02-24 DIAGNOSIS — I10 Essential (primary) hypertension: Secondary | ICD-10-CM

## 2015-02-24 NOTE — Assessment & Plan Note (Signed)
His diabetes mellitus has come under better control. His last hemoglobin A1c was down to 6.8. His weight loss is helping.

## 2015-02-24 NOTE — Assessment & Plan Note (Signed)
Both his HDL and LDL are now within goal range.

## 2015-02-24 NOTE — Assessment & Plan Note (Signed)
His blood pressure has improved coincident with weight loss.

## 2015-02-24 NOTE — Progress Notes (Signed)
Patient ID: Benjamin Hunter, male   DOB: April 26, 1961, 54 y.o.   MRN: [id number]          Patient Active Problem List   Diagnosis Date Noted  . Human immunodeficiency virus (HIV) disease 07/17/2008    Priority: High  . Seasonal allergies 09/20/2011    Priority: Medium  . Polyarthritis 09/06/2011    Priority: Medium  . Diabetes mellitus 03/16/2009    Priority: Medium  . Hyperlipidemia 07/17/2008    Priority: Medium  . Hypertension 07/17/2008    Priority: Medium  . Diabetes 06/17/2013  . Asymptomatic HIV infection 06/17/2013  . Dyslipidemia 06/17/2013  . HTN (hypertension) 06/17/2013  . Depression 01/14/2013  . Rash and nonspecific skin eruption 10/11/2012  . Recurrent low back pain 10/11/2012  . Left facial numbness 08/20/2012    Patient's Medications  New Prescriptions   No medications on file  Previous Medications   AMLODIPINE (NORVASC) 5 MG TABLET    Take 1 tablet (5 mg total) by mouth daily.   ASPIRIN 81 MG EC TABLET    Take 1 tablet (81 mg total) by mouth daily. Swallow whole.   ATORVASTATIN (LIPITOR) 20 MG TABLET    Take 1 tablet (20 mg total) by mouth daily.   BLOOD GLUCOSE MONITORING SUPPL (ACCU-CHEK AVIVA PLUS) W/DEVICE KIT    1,000 mg by Does not apply route 2 (two) times daily.   CYCLOBENZAPRINE (FLEXERIL) 5 MG TABLET    Take 1 tablet (5 mg total) by mouth at bedtime.   ELVITEGRAVIR-COBICISTAT-EMTRICITABINE-TENOFOVIR (GENVOYA) 150-150-200-10 MG TABS TABLET    Take 1 tablet by mouth daily with breakfast.   EZETIMIBE (ZETIA) 10 MG TABLET    Take 1 tablet (10 mg total) by mouth daily.   FENOFIBRATE (TRICOR) 48 MG TABLET    Take 1 tablet (48 mg total) by mouth daily.   GLIPIZIDE (GLUCOTROL) 10 MG TABLET    Take 1 tablet (10 mg total) by mouth daily.   GLIPIZIDE (GLUCOTROL) 10 MG TABLET    TAKE 1 TABLET BY MOUTH DAILY   GLUCOCOM LANCETS MISC    Check blood sugar TID & QHS diagnosis 250.00   GLUCOSE BLOOD (ACCU-CHEK AVIVA) TEST STRIP    Use as instructed   GLUCOSE  BLOOD (CHOICE DM FORA G20 TEST STRIPS) TEST STRIP    Use as instructed   GLUCOSE MONITORING KIT (FREESTYLE) MONITORING KIT    1 each by Does not apply route as needed for other. Check blood sugar TID & QHS diagnosis 250.00   HYDROCODONE-ACETAMINOPHEN (NORCO/VICODIN) 5-325 MG PER TABLET    1 to 2 tabs every 4 to 6 hours as needed for pain.   INSULIN GLARGINE (LANTUS) 100 UNIT/ML SOLOSTAR PEN    Inject 10 Units into the skin daily at 10 pm.   INSULIN PEN NEEDLE 31G X 8 MM MISC    Check blood sugar TID & QHS 250.00   LANCET DEVICES (ACCU-CHEK SOFTCLIX) LANCETS    Use as instructed   MELOXICAM (MOBIC) 15 MG TABLET    Take 1 tablet (15 mg total) by mouth daily.   METFORMIN (GLUCOPHAGE) 1000 MG TABLET    TAKE 1 TABLET BY MOUTH TWICE DAILY WITH A MEAL   METHYLPREDNISOLONE (MEDROL DOSEPAK) 4 MG TBPK TABLET    follow package directions   TRAMADOL (ULTRAM) 50 MG TABLET    Take 1 tablet (50 mg total) by mouth every 12 (twelve) hours as needed for moderate pain.  Modified Medications   No medications on file  Discontinued Medications   No medications on file    Subjective: Chris Cripps for his routine HIV follow-up visit. He recalls missing only 2 doses of his Genvoya since his last visit 6 months ago. That occurred when he got distracted taking care of his elderly mother. He takes it correctly each morning with breakfast. He has been trying to eat a healthier diet with less fried food and has lost weight. His blood sugars have been pretty consistently around 125 each morning. He is still having some right shoulder pain but continues to work on physical therapy at home.   Review of Systems: Constitutional: positive for weight loss, negative for anorexia, chills, fevers and sweats Eyes: negative Ears, nose, mouth, throat, and face: negative Respiratory: negative Cardiovascular: negative Gastrointestinal: negative Genitourinary:negative Integument/breast: negative Musculoskeletal:positive for chronic,  mild right shoulder pain with range of motion Behavioral/Psych: negative for anxiety and depression  Past Medical History  Diagnosis Date  . Diabetes mellitus   . HIV (human immunodeficiency virus infection)   . Hyperlipidemia   . Hypertension   . Arthritis   . Depression     Social History  Substance Use Topics  . Smoking status: Never Smoker   . Smokeless tobacco: Never Used  . Alcohol Use: Yes    Family History  Problem Relation Age of Onset  . Hypertension Mother     Allergies  Allergen Reactions  . Lisinopril Rash    Palmar rash     Objective:  Filed Vitals:   02/24/15 1000  BP: 109/77  Pulse: 80  Temp: 98.2 F (36.8 C)  Weight: 184 lb 8 oz (83.689 kg)   Body mass index is 25.74 kg/(m^2).  General: His weight is down 16 pounds Oral: No oropharyngeal lesions. Some missing teeth Skin: No rash Lungs: Clear Cor: Regular S1 and S2 with no murmurs Abdomen: Soft and nontender Joints and extremities: No warmth or swelling in his right shoulder. No crepitus with range of motion. Neuro: Alert with normal speech and conversation Mood: Normal. He does not appear anxious or depressed  Lab Results Lab Results  Component Value Date   WBC 3.6* 06/03/2014   HGB 12.9* 06/03/2014   HCT 38.6* 06/03/2014   MCV 89.8 06/03/2014   PLT 183 06/03/2014    Lab Results  Component Value Date   CREATININE 1.22 02/10/2015   BUN 20 02/10/2015   NA 134* 02/10/2015   K 4.1 02/10/2015   CL 101 02/10/2015   CO2 19* 02/10/2015    Lab Results  Component Value Date   ALT 11 02/10/2015   AST 15 02/10/2015   ALKPHOS 73 02/10/2015   BILITOT 0.6 02/10/2015    Lab Results  Component Value Date   CHOL 211* 02/10/2015   HDL 55 02/10/2015   LDLCALC 99 02/10/2015   TRIG 287* 02/10/2015   CHOLHDL 3.8 02/10/2015    Lab Results HIV 1 RNA QUANT (copies/mL)  Date Value  02/10/2015 <20  06/03/2014 <20  11/20/2013 <20   CD4 T CELL ABS (/uL)  Date Value  02/10/2015 510    06/03/2014 410  11/20/2013 1230     Problem List Items Addressed This Visit      High   Human immunodeficiency virus (HIV) disease    His HIV infection is under excellent control. I will continue Genvoya and see him after blood work in 6 months.      Relevant Orders   T-helper cell (CD4)- ([facility] clinic only)   HIV 1 RNA quant-no  reflex-bld   CBC   Comprehensive metabolic panel   Lipid panel   RPR     Medium   Hyperlipidemia    Both his HDL and LDL are now within goal range.      Hypertension    His blood pressure has improved coincident with weight loss.       Other Visit Diagnoses    Encounter for long-term (current) use of medications    -  Primary    Relevant Orders    Lipid panel         Michel Bickers, MD [facility] [place] [phone] pager   [phone] cell 02/24/2015, 10:24 AM

## 2015-02-24 NOTE — Assessment & Plan Note (Signed)
His HIV infection is under excellent control. I will continue Genvoya and see him after blood work in 6 months.

## 2015-03-14 ENCOUNTER — Other Ambulatory Visit: Payer: Self-pay | Admitting: Internal Medicine

## 2015-03-18 ENCOUNTER — Encounter (HOSPITAL_COMMUNITY): Payer: Self-pay | Admitting: Emergency Medicine

## 2015-03-18 ENCOUNTER — Telehealth: Payer: Self-pay | Admitting: *Deleted

## 2015-03-18 ENCOUNTER — Emergency Department (INDEPENDENT_AMBULATORY_CARE_PROVIDER_SITE_OTHER)
Admission: EM | Admit: 2015-03-18 | Discharge: 2015-03-18 | Disposition: A | Discharge status: Home / Self Care | Payer: Medicare Other | Source: Home / Self Care | Attending: Family Medicine | Admitting: Family Medicine

## 2015-03-18 ENCOUNTER — Telehealth: Payer: Self-pay | Admitting: Internal Medicine

## 2015-03-18 DIAGNOSIS — R739 Hyperglycemia, unspecified: Secondary | ICD-10-CM

## 2015-03-18 LAB — GLUCOSE, CAPILLARY: GLUCOSE-CAPILLARY: 381 mg/dL — AB (ref 65–99)

## 2015-03-18 MED ORDER — INSULIN ASPART 100 UNIT/ML ~~LOC~~ SOLN
SUBCUTANEOUS | Status: AC
  Filled 2015-03-18: qty 1

## 2015-03-18 MED ORDER — INSULIN ASPART 100 UNIT/ML ~~LOC~~ SOLN
2.0000 [IU] | Freq: Once | SUBCUTANEOUS | Status: AC
  Administered 2015-03-18: 2 [IU] via SUBCUTANEOUS

## 2015-03-18 NOTE — ED Provider Notes (Signed)
CSN: [id number]     Arrival date & time 03/18/15  1428 History   First MD Initiated Contact with Patient 03/18/15 1621     No chief complaint on file.  (Consider location/radiation/quality/duration/timing/severity/associated sxs/prior Treatment) The history is provided by the patient. No language interpreter was used.  patient presents with concern for hyperglycemia over the past 2 to 3 days, coincides with increased intake of soda.  He usually checks his CBG int he morning and readings in the 250-range. On Monday 10/17 was 567, yesterday evening 450.  Today in the 380-range at [facility].  He has been taking his oral DM meds but not the Lantus insulin which had been prescribed in January at his PCP office.  He did use a dose of Lantus 10 units yesterday evening, but the vial has been opened for more than a month.   ROS: Denies fevers or chills, no cough, no sputum production, no shortness of breath, no chest pain, no abdominal pain, no dysuria or polyuria, no diarrhea, no skin wounds or abscesses.  Denies pain except for mild dull bitemporal headache without photophobia or N/V and that resolves with a single aspirin tablet.   Past Medical History  Diagnosis Date  . Diabetes mellitus   . HIV (human immunodeficiency virus infection) ([name])   . Hyperlipidemia   . Hypertension   . Arthritis   . Depression    No past surgical history on file. Family History  Problem Relation Age of Onset  . Hypertension Mother    Social History  Substance Use Topics  . Smoking status: Never Smoker   . Smokeless tobacco: Never Used  . Alcohol Use: Yes    Review of Systems  Allergies  Lisinopril  Home Medications   Prior to Admission medications   Medication Sig Start Date End Date Taking? Authorizing Provider  amLODipine (NORVASC) 5 MG tablet Take 1 tablet (5 mg total) by mouth daily. 01/14/15   Truman Hayward, MD  aspirin 81 MG EC tablet Take 1 tablet (81 mg total) by mouth daily. Swallow whole.  09/20/12   Michel Bickers, MD  atorvastatin (LIPITOR) 20 MG tablet Take 1 tablet (20 mg total) by mouth daily. 02/17/15   Michel Bickers, MD  Blood Glucose Monitoring Suppl (ACCU-CHEK AVIVA PLUS) W/DEVICE KIT 1,000 mg by Does not apply route 2 (two) times daily. 06/06/14   Lorayne Marek, MD  cyclobenzaprine (FLEXERIL) 5 MG tablet Take 1 tablet (5 mg total) by mouth at bedtime. 11/10/14   Lorayne Marek, MD  elvitegravir-cobicistat-emtricitabine-tenofovir (GENVOYA) 150-150-200-10 MG TABS tablet Take 1 tablet by mouth daily with breakfast. 12/04/14   Truman Hayward, MD  ezetimibe (ZETIA) 10 MG tablet Take 1 tablet (10 mg total) by mouth daily. 01/13/15   Michel Bickers, MD  fenofibrate (TRICOR) 48 MG tablet Take 1 tablet (48 mg total) by mouth daily. 02/20/15   Tresa Garter, MD  glipiZIDE (GLUCOTROL) 10 MG tablet Take 1 tablet (10 mg total) by mouth daily. 05/14/13   Michel Bickers, MD  glipiZIDE (GLUCOTROL) 10 MG tablet TAKE 1 TABLET BY MOUTH DAILY 09/12/14   Carlyle Basques, MD  GlucoCom Lancets MISC Check blood sugar TID & QHS diagnosis 250.00 06/09/14   Lorayne Marek, MD  glucose blood (ACCU-CHEK AVIVA) test strip Use as instructed 06/06/14   Lorayne Marek, MD  glucose blood (CHOICE DM FORA G20 TEST STRIPS) test strip Use as instructed 06/09/14   Lorayne Marek, MD  glucose monitoring kit (FREESTYLE) monitoring kit 1 each  by Does not apply route as needed for other. Check blood sugar TID & QHS diagnosis 250.00 06/09/14   Lorayne Marek, MD  HYDROcodone-acetaminophen (NORCO/VICODIN) 5-325 MG per tablet 1 to 2 tabs every 4 to 6 hours as needed for pain. 01/25/14   Harden Mo, MD  Insulin Glargine (LANTUS) 100 UNIT/ML Solostar Pen Inject 10 Units into the skin daily at 10 pm. 06/09/14   Lorayne Marek, MD  Insulin Pen Needle 31G X 8 MM MISC Check blood sugar TID & QHS 250.00 06/09/14   Lorayne Marek, MD  Lancet Devices (ACCU-CHEK [facility]) lancets Use as instructed 06/06/14   Lorayne Marek, MD  meloxicam (MOBIC)  15 MG tablet Take 1 tablet (15 mg total) by mouth daily. 01/25/14   Harden Mo, MD  metFORMIN (GLUCOPHAGE) 1000 MG tablet TAKE 1 TABLET BY MOUTH TWICE DAILY WITH A MEAL 12/29/14   Lorayne Marek, MD  methylPREDNISolone (MEDROL DOSEPAK) 4 MG TBPK tablet follow package directions 11/10/14   Lorayne Marek, MD  traMADol (ULTRAM) 50 MG tablet Take 1 tablet (50 mg total) by mouth every 12 (twelve) hours as needed for moderate pain. 11/10/14   Lorayne Marek, MD   Meds Ordered and Administered this Visit   Medications  insulin aspart (novoLOG) injection 2 Units (not administered)    BP 108/72 mmHg  Pulse 91  Temp(Src) 98.1 F (36.7 C) (Oral)  Resp 18  SpO2 98% No data found.   Physical Exam  Constitutional: He is oriented to person, place, and time. He appears well-developed and well-nourished. No distress.  Extremely well appearing, no apparent distress  HENT:  Head: Normocephalic and atraumatic.  Right Ear: External ear normal.  Left Ear: External ear normal.  Nose: Nose normal.  Mouth/Throat: Oropharynx is clear and moist. No oropharyngeal exudate.  No frontal or maxillary sinus tenderness. No tenderness over temples with palpation. Full active ROM neck.   Eyes: Conjunctivae and EOM are normal. Pupils are equal, round, and reactive to light. Right eye exhibits no discharge. Left eye exhibits no discharge. No scleral icterus.  Neck: Neck supple. No JVD present. No tracheal deviation present. No thyromegaly present.  Cardiovascular: Normal rate, regular rhythm and normal heart sounds.   Pulmonary/Chest: Effort normal and breath sounds normal. No stridor. No respiratory distress. He has no wheezes. He has no rales. He exhibits no tenderness.  Abdominal: Soft. Bowel sounds are normal. He exhibits no distension and no mass. There is no tenderness. There is no rebound and no guarding.  Lymphadenopathy:    He has no cervical adenopathy.  Neurological: He is alert and oriented to person, place,  and time. He exhibits normal muscle tone.  Skin: He is not diaphoretic.  Axillary no abscesses or adenopathy noted.    ED Course  Procedures (including critical care time)  Labs Review Labs Reviewed  GLUCOSE, CAPILLARY - Abnormal; Notable for the following:    Glucose-Capillary 381 (*)    All other components within normal limits    Imaging Review No results found.   Visual Acuity Review  Right Eye Distance:   Left Eye Distance:   Bilateral Distance:    Right Eye Near:   Left Eye Near:    Bilateral Near:         MDM   1. Hyperglycemia    Patient with DM2, recent rise in home CBG readings since he began drinking more sodas than usual. Nothing in ROS or exam to suggest infectious etiology to rise in glucose.  Patient  appears quite well and not concerning for DKA.  He insists upon receiving rapid-acting insulin in the [facility] today, given Novolog 2 units subcutaneously.  Instructed to resume using Lantus 10 units at bedtime, which he had not been using regularly. In addition, to continue using his metformin, to hold his sulfonylurea while on insulin.  Will make appointment with [facility] and Wellness for routine management.     Willeen Niece, MD 03/18/15 [phone]

## 2015-03-18 NOTE — ED Notes (Addendum)
C/o headache and high blood sugar.  cbg per patient 468 last night and 365 this morning

## 2015-03-18 NOTE — ED Notes (Signed)
Patient concerned for elevated cbg.  Patient concerned for headache for 3 days.  Patient concerned for blurry vision and dizziness.  Patient reports in the past when he has felt this way,he reports it is usually because sugar is elevated, and  he gets injectable insulin to bring down sugar quicker-per patient.  Patient reports he is unable to get appt at his pcp. And specific physician at [facility] has left and not sure who is assuming his care there.  Dr Orvan Falconercampbell continues to manage hiv status

## 2015-03-18 NOTE — Discharge Instructions (Signed)
It was a pleasure to see you today.   For your elevated glucose levels, you received 2 units of fast-acting insulin (Novolog) today.   I ask that you resume taking the Lantus insulin 10 units at bedtime.  Continue taking your metformin as you have been doing. HOLD YOUR GLIPIZIDE NOW THAT YOU ARE TAKING THE LANTUS INSULIN.  Please refrain from drinking more sodas.  Please continue to check your glucose in the mornings.  [facility] for your diabetes.

## 2015-03-18 NOTE — Telephone Encounter (Signed)
Patient called asking for advice.  His has a headache, feels dizzy, and states his blood sugar has been elevated for a "couple of days" ("567 yesterday, now closer to 400").  He called asking if he could come here for injectable insulin, stating he is unable to be seen by [facility] (his PCP Dr. Orpah CobbAdvani has left, he would first need to establish care with a new PCP).  Per protocol, RN advised patient to go to Urgent Care or the ED for evaluation and treatment.  He states he is unable to do so, as he has to get home and feed his mother.  He states no one else is available to help him with his duties at home, he must take care of her first and then "will figure something out."  RN encouraged the patient to please seek care for his hyperglycemia as soon as he possibly can.  Patient again stated he would "Figure something out." RN contacted [facility], asked for their triage nurse to contact the patient for further assistance.  Per triage, this call will be marked urgent. Andree CossHowell, Searra Carnathan M, RN

## 2015-03-18 NOTE — Telephone Encounter (Signed)
Marcelino DusterMichelle from Infectious Disease called stating that pt. Called them to see if they could give him insulin because his blood sugar is 567. Marcelino DusterMichelle advised him to go the the ED or Urgent Care but he stated he is the only one to take care of his mother. He is taking his medication but it is not helping. Please f/u with pt. For advise.

## 2015-03-20 ENCOUNTER — Telehealth: Payer: Self-pay

## 2015-03-20 NOTE — Telephone Encounter (Signed)
Returned patient phone call Patient not available Left message with patient's mom  To have him return our call

## 2015-04-14 DIAGNOSIS — E119 Type 2 diabetes mellitus without complications: Secondary | ICD-10-CM | POA: Diagnosis not present

## 2015-04-14 LAB — HM DIABETES EYE EXAM

## 2015-04-22 ENCOUNTER — Telehealth: Payer: Self-pay | Admitting: Internal Medicine

## 2015-04-22 ENCOUNTER — Other Ambulatory Visit: Payer: Self-pay | Admitting: Internal Medicine

## 2015-04-22 NOTE — Telephone Encounter (Signed)
Pt requesting refill on glipizide. Please f/u with pt, [facility] [address].  Pt also asking to know who will be his new PCP.

## 2015-04-24 ENCOUNTER — Other Ambulatory Visit: Payer: Self-pay | Admitting: Internal Medicine

## 2015-04-27 ENCOUNTER — Telehealth: Payer: Self-pay | Admitting: *Deleted

## 2015-04-27 NOTE — Telephone Encounter (Signed)
It looks like [facility]

## 2015-04-27 NOTE — Telephone Encounter (Signed)
Patient is requesting refill of glipizide. Dr Orpah CobbAdvani is listed as his PCP, but it appears [facility] is [facility] a [facility].  Please advise if this patient is still under your care, as Dr. Orpah CobbAdvani is not available via [facility]. Andree CossHowell, Lido Maske M, RN

## 2015-04-28 ENCOUNTER — Other Ambulatory Visit: Payer: Self-pay

## 2015-04-28 ENCOUNTER — Telehealth: Payer: Self-pay

## 2015-04-28 NOTE — Telephone Encounter (Signed)
Patient had called about his glipizide  [facility]15 This was noted by Dr Lovina Reachampbells office

## 2015-04-28 NOTE — Telephone Encounter (Signed)
Patient called nurse, patient verified date of birth. Patient is requesting refill on glipizide. Patient reports vision is blurry since stopping glipizide. Patient is currently at eye doctor.  Nurse reviewed chart. Per Urgent care OV note on 03/18/15 "I ask that you resume taking the Lantus insulin 10 units at bedtime.  Continue taking your metformin as you have been doing. HOLD YOUR GLIPIZIDE NOW THAT YOU ARE TAKING THE LANTUS INSULIN" Nurse explained note to patient.  Patient needs appointment for DM.  Appointment made for Thursday, December 1 at 2pm with Dr. Mauricio PoBreen.

## 2015-04-28 NOTE — Telephone Encounter (Signed)
Contacted [facility] to request the refill, as it had not yet been authorized.  They sent it today.

## 2015-04-30 ENCOUNTER — Ambulatory Visit: Payer: Medicare Other | Attending: Family Medicine | Admitting: Family Medicine

## 2015-04-30 ENCOUNTER — Other Ambulatory Visit: Payer: Self-pay | Admitting: Internal Medicine

## 2015-04-30 ENCOUNTER — Encounter: Payer: Self-pay | Admitting: Family Medicine

## 2015-04-30 VITALS — BP 123/77 | HR 73 | Temp 98.4°F | Resp 16 | Ht 71.0 in | Wt 187.0 lb

## 2015-04-30 DIAGNOSIS — E119 Type 2 diabetes mellitus without complications: Secondary | ICD-10-CM | POA: Insufficient documentation

## 2015-04-30 DIAGNOSIS — B20 Human immunodeficiency virus [HIV] disease: Secondary | ICD-10-CM | POA: Diagnosis not present

## 2015-04-30 LAB — POCT GLYCOSYLATED HEMOGLOBIN (HGB A1C): Hemoglobin A1C: 9.1

## 2015-04-30 LAB — GLUCOSE, POCT (MANUAL RESULT ENTRY): POC GLUCOSE: 154 mg/dL — AB (ref 70–99)

## 2015-04-30 MED ORDER — GLIPIZIDE 10 MG PO TABS: 10.0000 mg | ORAL_TABLET | Freq: Every day | ORAL | Status: DC

## 2015-04-30 NOTE — Progress Notes (Signed)
   Subjective:    Patient ID: Benjamin GoodpastureKenneth Vuncannon, male    DOB: [birth year], 54 y.o.   MRN: [id number]  HPI Patient comes in for follow up of DM.  Also with complaint that he has had blurred vision for the past couple of weeks, since right after his eye doctor appt (Dr Harriette BouillonMcFarland on [address]).  Denies floaters or flashes of light, does have blurriness.  Says he was told his eyes were doing well.   Has [facility]. Home CBGs in the 130-200 range. Is using Lantus 10units daily; has held his glipizide 10mg  daily since starting Lantus.  Is continuing to use metformin twice daily.  Denies polyuria or polydipsia, no weight changes, feels well.    HIV followed by Dr Orvan Falconerampbell, has undetectable viral load at last visit.    Review of Systems No fevers or chills, no weight changes, no cough, did get a flu shot this season.     Objective:   Physical Exam  Well appearing, no distress  HEENT moist mucus membranes. Clear oropharynx. No cervical adenopathy. PERRL EOMI.  Neck supple COR Regular S1S2  PULM Clear bilaterally no rales or wheezes      Assessment & Plan:

## 2015-04-30 NOTE — Progress Notes (Signed)
Pt's here for diabetes f/up. Pt states that he's having some issues with vision. Pt reports that he having blurry vision for x2wk.  He states that he's had an I exam around 2-3 wks ago. Patient denies pain today.  Pt requesting refill of Glipizide  Patient looking to est. care with PCP.

## 2015-04-30 NOTE — Patient Instructions (Signed)
It was a pleasure to see you today.   For the blurred vision, I ask that you sign a release-of-information form for records from Dr Harriette BouillonMcFarland on [address].   For the diabetes, we are HOLDING THE LANTUS INSULIN.   RESTART THE GLIPIZIDE 10mg  tablets, ONE TABLET BY MOUTH IN THE MORNING WITH BREAKFAST.  CONTINUE THE METFORMIN 1000mg  ONE TABLET TWICE DAILY.   Return to this office in one month WITH YOUR GLUCOMETER to see how your home sugar readings are doing.

## 2015-04-30 NOTE — Assessment & Plan Note (Signed)
CBGs relatively well controlled. To return to glipizide 10mg  daily, keep metformin 1000mg  bid.  Hold Lantus for now while going back on sulfonylurea.

## 2015-05-01 ENCOUNTER — Other Ambulatory Visit: Payer: Self-pay | Admitting: *Deleted

## 2015-05-01 DIAGNOSIS — B20 Human immunodeficiency virus [HIV] disease: Secondary | ICD-10-CM

## 2015-05-01 LAB — MICROALBUMIN / CREATININE URINE RATIO
CREATININE, URINE: 149 mg/dL (ref 20–370)
MICROALB UR: 21.3 mg/dL
Microalb Creat Ratio: 143 mcg/mg creat — ABNORMAL HIGH (ref <30)

## 2015-05-01 MED ORDER — ELVITEG-COBIC-EMTRICIT-TENOFAF 150-150-200-10 MG PO TABS: 1.0000 | ORAL_TABLET | Freq: Every day | ORAL | Status: DC

## 2015-05-04 ENCOUNTER — Telehealth: Payer: Self-pay | Admitting: *Deleted

## 2015-05-04 NOTE — Telephone Encounter (Signed)
  Spoke with patient, verified name and date of birth.  Gave results of elevated A1c and transferred [facility] the front who made him a follow up appointment.

## 2015-05-04 NOTE — Telephone Encounter (Signed)
-----   Message from Jaclyn ShaggyEnobong Amao, MD sent at 05/04/2015  9:40 AM EST ----- He saw Dr Mauricio PoBreen with Hba1c of 9.1, needs a follow up visit for management of DM in 3 weeks

## 2015-05-13 ENCOUNTER — Encounter: Payer: Self-pay | Admitting: Family Medicine

## 2015-05-13 ENCOUNTER — Ambulatory Visit: Payer: Medicare Other | Attending: Family Medicine | Admitting: Family Medicine

## 2015-05-13 VITALS — BP 113/72 | HR 73 | Temp 97.9°F | Resp 18 | Ht 71.0 in | Wt 189.0 lb

## 2015-05-13 DIAGNOSIS — I10 Essential (primary) hypertension: Secondary | ICD-10-CM

## 2015-05-13 DIAGNOSIS — E119 Type 2 diabetes mellitus without complications: Secondary | ICD-10-CM | POA: Diagnosis not present

## 2015-05-13 DIAGNOSIS — Z888 Allergy status to other drugs, medicaments and biological substances status: Secondary | ICD-10-CM | POA: Diagnosis not present

## 2015-05-13 DIAGNOSIS — E1165 Type 2 diabetes mellitus with hyperglycemia: Secondary | ICD-10-CM | POA: Diagnosis not present

## 2015-05-13 DIAGNOSIS — F329 Major depressive disorder, single episode, unspecified: Secondary | ICD-10-CM | POA: Insufficient documentation

## 2015-05-13 DIAGNOSIS — Z794 Long term (current) use of insulin: Secondary | ICD-10-CM | POA: Insufficient documentation

## 2015-05-13 DIAGNOSIS — B20 Human immunodeficiency virus [HIV] disease: Secondary | ICD-10-CM | POA: Diagnosis not present

## 2015-05-13 DIAGNOSIS — E785 Hyperlipidemia, unspecified: Secondary | ICD-10-CM | POA: Insufficient documentation

## 2015-05-13 DIAGNOSIS — H538 Other visual disturbances: Secondary | ICD-10-CM

## 2015-05-13 DIAGNOSIS — Z79899 Other long term (current) drug therapy: Secondary | ICD-10-CM | POA: Diagnosis not present

## 2015-05-13 DIAGNOSIS — Z7982 Long term (current) use of aspirin: Secondary | ICD-10-CM | POA: Insufficient documentation

## 2015-05-13 DIAGNOSIS — M25511 Pain in right shoulder: Secondary | ICD-10-CM

## 2015-05-13 DIAGNOSIS — Z7984 Long term (current) use of oral hypoglycemic drugs: Secondary | ICD-10-CM | POA: Insufficient documentation

## 2015-05-13 LAB — GLUCOSE, POCT (MANUAL RESULT ENTRY): POC GLUCOSE: 94 mg/dL (ref 70–99)

## 2015-05-13 NOTE — Patient Instructions (Signed)

## 2015-05-13 NOTE — Progress Notes (Signed)
CC: Right shoulder pain  HPI: Benjamin Hunter is a 54 y.o. male with a history of uncontrolled type 2 diabetes mellitus, hypertension, HIV who comes into the clinic complaining of right shoulder pain for the last 1 year with reduced range of motion as a result of frequently having to pick up his disabled mom.  He has been taking meloxicam with no much relief in symptoms and has had no shoulder imaging done because he cannot afford to pay the co-pay associated with imaging and refuses any studies at this time but is requesting "a compression to apply to his right shoulder"  He also has had blurry vision which he complained of that his last office visit and states he had seen his ophthalmologist a month ago with no ophthalmic pathology identified (he does wear corrective lenses)  A review of his chart his A1c has trended up from 6.1 in 10/2014 to 9.1 recently. At his last visit his Lantus was discontinued and he was placed on glipizide with resulting improvement in his blood sugars evidenced by his CBG today of 94  Patient has No headache, No chest pain, No abdominal pain - No Nausea, No new weakness tingling or numbness, No Cough - SOB.  Allergies  Allergen Reactions  . Lisinopril Rash    Palmar rash    Past Medical History  Diagnosis Date  . Diabetes mellitus   . HIV (human immunodeficiency virus infection) ([place])   . Hyperlipidemia   . Hypertension   . Arthritis   . Depression    Current Outpatient Prescriptions on File Prior to Visit  Medication Sig Dispense Refill  . amLODipine (NORVASC) 5 MG tablet Take 1 tablet (5 mg total) by mouth daily. 30 tablet 6  . aspirin 81 MG EC tablet Take 1 tablet (81 mg total) by mouth daily. Swallow whole. 30 tablet 12  . atorvastatin (LIPITOR) 20 MG tablet Take 1 tablet (20 mg total) by mouth daily. 30 tablet 11  . Blood Glucose Monitoring Suppl (ACCU-CHEK AVIVA PLUS) W/DEVICE KIT 1,000 mg by Does not apply route 2 (two) times daily. 1 kit 0  .  cyclobenzaprine (FLEXERIL) 5 MG tablet Take 1 tablet (5 mg total) by mouth at bedtime. 30 tablet 1  . elvitegravir-cobicistat-emtricitabine-tenofovir (GENVOYA) 150-150-200-10 MG TABS tablet Take 1 tablet by mouth daily with breakfast. 30 tablet 11  . ezetimibe (ZETIA) 10 MG tablet Take 1 tablet (10 mg total) by mouth daily. 30 tablet 11  . fenofibrate (TRICOR) 48 MG tablet Take 1 tablet (48 mg total) by mouth daily. 30 tablet 2  . glipiZIDE (GLUCOTROL) 10 MG tablet Take 1 tablet (10 mg total) by mouth daily before breakfast. 30 tablet 3  . GlucoCom Lancets MISC Check blood sugar TID & QHS diagnosis 250.00 100 each 0  . glucose blood (ACCU-CHEK AVIVA) test strip Use as instructed 100 each 12  . glucose blood (CHOICE DM FORA G20 TEST STRIPS) test strip Use as instructed 100 each 12  . glucose monitoring kit (FREESTYLE) monitoring kit 1 each by Does not apply route as needed for other. Check blood sugar TID & QHS diagnosis 250.00 1 each 0  . HYDROcodone-acetaminophen (NORCO/VICODIN) 5-325 MG per tablet 1 to 2 tabs every 4 to 6 hours as needed for pain. 20 tablet 0  . Insulin Pen Needle 31G X 8 MM MISC Check blood sugar TID & QHS 250.00 100 each 2  . Lancet Devices (ACCU-CHEK SOFTCLIX) lancets Use as instructed 1 each 0  . meloxicam (  MOBIC) 15 MG tablet Take 1 tablet (15 mg total) by mouth daily. 15 tablet 0  . metFORMIN (GLUCOPHAGE) 1000 MG tablet TAKE 1 TABLET BY MOUTH TWICE DAILY WITH A MEAL 60 tablet 0  . traMADol (ULTRAM) 50 MG tablet Take 1 tablet (50 mg total) by mouth every 12 (twelve) hours as needed for moderate pain. 30 tablet 0   No current facility-administered medications on file prior to visit.   Family History  Problem Relation Age of Onset  . Hypertension Mother    Social History   Social History  . Marital Status: Single    Spouse Name: N/A  . Number of Children: N/A  . Years of Education: N/A   Occupational History  . Not on file.   Social History Main Topics  .  Smoking status: Never Smoker   . Smokeless tobacco: Never Used  . Alcohol Use: Yes  . Drug Use: No  . Sexual Activity: Not on file     Comment: declined condoms today   Other Topics Concern  . Not on file   Social History Narrative    Review of Systems: Constitutional: Negative for fever, chills, diaphoresis, activity change, appetite change and fatigue. HENT: Negative for ear pain, nosebleeds, congestion, facial swelling, rhinorrhea, neck pain, neck stiffness and ear discharge.  Eyes: see hpi Respiratory: Negative for cough, choking, chest tightness, shortness of breath, wheezing and stridor.  Cardiovascular: Negative for chest pain, palpitations and leg swelling. Gastrointestinal: Negative for abdominal distention. Genitourinary: Negative for dysuria, urgency, frequency, hematuria, flank pain, decreased urine volume, difficulty urinating and dyspareunia.  Musculoskeletal: see hpi Neurological: Negative for dizziness, tremors, seizures, syncope, facial asymmetry, speech difficulty, weakness, light-headedness, numbness and headaches.  Hematological: Negative for adenopathy. Does not bruise/bleed easily. Psychiatric/Behavioral: Negative for hallucinations, behavioral problems, confusion, dysphoric mood, decreased concentration and agitation.    Objective:   Filed Vitals:   05/13/15 1543  BP: 113/72  Pulse: 73  Temp: 97.9 F (36.6 C)  Resp: 18    Physical Exam: Constitutional: Patient appears well-developed and well-nourished. No distress. HENT: Normocephalic, atraumatic, External right and left ear normal. Oropharynx is clear and moist.  Eyes: Conjunctivae and EOM are normal. PERRLA, no scleral icterus; corrected visual acuity is 20/25 in each eye.  Neck: Normal ROM. No JVD. No tracheal deviation. No thyromegaly. CVS: RRR, S1/S2 +, no murmurs, no gallops, no carotid bruit.  Pulmonary: Effort and breath sounds normal, no stridor, rhonchi, wheezes, rales.  Abdominal: Soft.  BS +,  no distension, tenderness, rebound or guarding.  Musculoskeletal: Range of motion of right shoulder limited to 90, tenderness on palpation of anterior shoulder  Lymphadenopathy: No lymphadenopathy noted, cervical, inguinal or axillary Neuro: Alert. Normal reflexes, muscle tone coordination. No cranial nerve deficit. Skin: Skin is warm and dry. No rash noted. Not diaphoretic. No erythema. No pallor. Psychiatric: Normal mood and affect. Behavior, judgment, thought content normal.  Lab Results  Component Value Date   WBC 3.6* 06/03/2014   HGB 12.9* 06/03/2014   HCT 38.6* 06/03/2014   MCV 89.8 06/03/2014   PLT 183 06/03/2014   Lab Results  Component Value Date   CREATININE 1.22 02/10/2015   BUN 20 02/10/2015   NA 134* 02/10/2015   K 4.1 02/10/2015   CL 101 02/10/2015   CO2 19* 02/10/2015    Lab Results  Component Value Date   HGBA1C 9.10 04/30/2015   Lipid Panel     Component Value Date/Time   CHOL 211* 02/10/2015 1225   TRIG  287* 02/10/2015 1225   HDL 55 02/10/2015 1225   CHOLHDL 3.8 02/10/2015 1225   VLDL 57* 02/10/2015 1225   LDLCALC 99 02/10/2015 1225       Assessment and plan:  Uncontrolled type 2 diabetes mellitus with microalbuminuria: A1c of 9.1 continue medications  Right shoulder pain/ frozen shoulder I have explained to him that I will need to order an x-ray and subsequently an MRI to evaluate but he is resistant at this time. He might even be a good candidate for cortisone injection. Continue NSAIDs and tramadol as needed.  Blurry vision: Could be secondary to poor glycemic control and is expected to improve with better control of DM  HIV: CD4 count of 1166 Continue antiretroviral therapy and follow-up with infectious disease.      Arnoldo Morale, [facility] [phone] 05/13/2015, 4:56 PM

## 2015-05-13 NOTE — Progress Notes (Signed)
Patient here for DM f/up.   Patient c/o of right shoulder off and on rated at 7/10 describes as sharp pain from elbow radiating to the rotator cuff for a year now.   Patients reports having roblems with heavy lifting.  Patient still having blurred vision since last visit.  Patient would like some sort of compression for your arm/shoulder.

## 2015-05-23 ENCOUNTER — Emergency Department (INDEPENDENT_AMBULATORY_CARE_PROVIDER_SITE_OTHER)
Admission: EM | Admit: 2015-05-23 | Discharge: 2015-05-23 | Disposition: A | Discharge status: Home / Self Care | Payer: Medicare Other | Source: Home / Self Care | Attending: Emergency Medicine | Admitting: Emergency Medicine

## 2015-05-23 ENCOUNTER — Encounter (HOSPITAL_COMMUNITY): Payer: Self-pay

## 2015-05-23 DIAGNOSIS — J0140 Acute pansinusitis, unspecified: Secondary | ICD-10-CM

## 2015-05-23 MED ORDER — AMOXICILLIN-POT CLAVULANATE 875-125 MG PO TABS: 1.0000 | ORAL_TABLET | Freq: Two times a day (BID) | ORAL | Status: DC

## 2015-05-23 MED ORDER — IBUPROFEN 800 MG PO TABS: 800.0000 mg | ORAL_TABLET | Freq: Three times a day (TID) | ORAL | Status: DC | PRN

## 2015-05-23 MED ORDER — FLUTICASONE PROPIONATE 50 MCG/ACT NA SUSP: 2.0000 | Freq: Every day | NASAL | Status: DC

## 2015-05-23 NOTE — Discharge Instructions (Signed)
Take the medication as written. Return if you get worse, have a fever >100.4, or for any concerns. You may take 800 mg of motrin with 1 gram of tylenol up to 3 times a day as needed for pain. This is an effective combination for pain.  Most sinus infections are viral and do not need antibiotics unless you have a high fever, have had this for 10 day, or you get better and then get sick again. Use a neti pot or the NeilMed sinus rinse as often as you want to to reduce nasal congestion. Follow the directions on the box.   Go to www.goodrx.com to look up your medications. This will give you a list of where you can find your prescriptions at the most affordable prices.

## 2015-05-23 NOTE — ED Provider Notes (Signed)
SUBJECTIVE:  Benjamin Hunter is a 54 y.o. male who presents with nasal congestion, frontal headache, sinus pressure occasional nonproductive cough, lightheadedness when going from sitting to standing for one week. States that he got better and then got worse. He has been taking aspirin and Tussionex. He denies sore throat, postnasal drip, neck stiffness, neck pain, wheezing, chest pain, shortness of breath, ear pain, abdominal pain, diarrhea. No vertigo. States that his upper teeth do not hurt. States that his appetite is okay. He has taken antipyretic in the past 4-6 hours. Past medical history of HIV, CD4 500-700, viral load undetectable, diabetes, states his glucose has been running within normal limits, sinusitis, hypertension. No history of asthma, emphysema, COPD. No tobacco use. Patient got his flu shot this year.  Past Medical History:   Diabetes mellitus                                            HIV (human immunodeficiency virus infection) (*              Hyperlipidemia                                               Hypertension                                                 Arthritis                                                    Depression                                                  History reviewed. No pertinent surgical history.  Review of patient's family history indicates:   Hypertension                   Mother                     Smoking Status: Never Smoker                     Smokeless Status: Never Used                       Alcohol Use: Yes            No current facility-administered medications for this encounter. Current outpatient prescriptions: .  amLODipine (NORVASC) 5 MG tablet, Take 1 tablet (5 mg total) by mouth daily., Disp: 30 tablet, Rfl: 6.  aspirin 81 MG EC tablet, Take 1 tablet (81 mg total) by mouth daily. Swallow whole., Disp: 30 tablet, Rfl: 12.  atorvastatin (LIPITOR) 20 MG tablet, Take 1 tablet (20 mg total) by mouth daily., Disp: 30  tablet, Rfl: 11.  Blood Glucose Monitoring Suppl (ACCU-CHEK AVIVA PLUS)  W/DEVICE KIT, 1,000 mg by Does not apply route 2 (two) times daily., Disp: 1 kit, Rfl: 0.  cyclobenzaprine (FLEXERIL) 5 MG tablet, Take 1 tablet (5 mg total) by mouth at bedtime., Disp: 30 tablet, Rfl: 1.  elvitegravir-cobicistat-emtricitabine-tenofovir (GENVOYA) 150-150-200-10 MG TABS tablet, Take 1 tablet by mouth daily with breakfast., Disp: 30 tablet, Rfl: 11.  ezetimibe (ZETIA) 10 MG tablet, Take 1 tablet (10 mg total) by mouth daily., Disp: 30 tablet, Rfl: 11.  fenofibrate (TRICOR) 48 MG tablet, Take 1 tablet (48 mg total) by mouth daily., Disp: 30 tablet, Rfl: 2.  glipiZIDE (GLUCOTROL) 10 MG tablet, Take 1 tablet (10 mg total) by mouth daily before breakfast., Disp: 30 tablet, Rfl: 3.  GlucoCom Lancets MISC, Check blood sugar TID & QHS diagnosis 250.00, Disp: 100 each, Rfl: 0.  glucose blood (ACCU-CHEK AVIVA) test strip, Use as instructed, Disp: 100 each, Rfl: 12.  glucose blood (CHOICE DM FORA G20 TEST STRIPS) test strip, Use as instructed, Disp: 100 each, Rfl: 12.  glucose monitoring kit (FREESTYLE) monitoring kit, 1 each by Does not apply route as needed for other. Check blood sugar TID & QHS diagnosis 250.00, Disp: 1 each, Rfl: 0.  HYDROcodone-acetaminophen (NORCO/VICODIN) 5-325 MG per tablet, 1 to 2 tabs every 4 to 6 hours as needed for pain., Disp: 20 tablet, Rfl: 0.  Insulin Pen Needle 31G X 8 MM MISC, Check blood sugar TID & QHS 250.00, Disp: 100 each, Rfl: 2.  Lancet Devices (ACCU-CHEK SOFTCLIX) lancets, Use as instructed, Disp: 1 each, Rfl: 0.  meloxicam (MOBIC) 15 MG tablet, Take 1 tablet (15 mg total) by mouth daily., Disp: 15 tablet, Rfl: 0.  metFORMIN (GLUCOPHAGE) 1000 MG tablet, TAKE 1 TABLET BY MOUTH TWICE DAILY WITH A MEAL, Disp: 60 tablet, Rfl: 0.  traMADol (ULTRAM) 50 MG tablet, Take 1 tablet (50 mg total) by mouth every 12 (twelve) hours as needed for moderate pain., Disp: 30 tablet, Rfl: 0   -- Lisinopril -- Rash    --  Palmar rash   ROS  As noted in HPI.   Physical Exam  BP 147/87 mmHg  Pulse 81  Temp(Src) 98.4 F (36.9 C) (Oral)  Resp 16  SpO2 100%  Constitutional: Well developed, well nourished, no acute distress Eyes:  EOMI, conjunctiva normal bilaterally HENT: Normocephalic, atraumatic,mucus membranes moist. TMs normal bilaterally. + / - clear / purulent nasal congestion.  red turbinates. +  maxillary, frontal sinus tenderness. Oropharynx normal  - postnasal drip.  Respiratory: Normal inspiratory effort Cardiovascular: Normal rate GI: nondistended skin: No rash, skin intact Musculoskeletal: no deformities Neurologic: Alert & oriented x 3, no focal neuro deficits Psychiatric: Speech and behavior appropriate   ED Course   Medications - No data to display  No orders of the defined types were placed in this encounter.    No results found for this or any previous visit (from the past 24 hour(s)). No results found.  ED Clinical Impression  Acute pansinusitis  ED Assessment/Plan  No evidence of otitis, meningitis, pneumonia or intra-abdominal process. Presentation was consistent with a sinusitis.  Pt with indications for abx- given history of double sickening. Will start augmentin,  in addition to nasal steriods, mucinex, 100 mg of ibuprofen 3 times a day, saline nasal irrigation, increase fluids, tylenol/motrin prn pain. Discussed MDM and plan with pt. Discussed sn/sx that should prompt return to the UC or ED. Pt agrees with plan and will f/u with PMD prn.    *This clinic note was created using Dragon  dictation software. Therefore, there may be occasional mistakes despite careful proofreading.   Melynda Ripple, MD 05/23/15 [phone]

## 2015-05-23 NOTE — ED Notes (Signed)
Pt having sinus issues and congestion for 1 week Pt alert and oriented

## 2015-07-08 ENCOUNTER — Encounter (HOSPITAL_COMMUNITY): Payer: Self-pay | Admitting: *Deleted

## 2015-07-08 ENCOUNTER — Emergency Department (INDEPENDENT_AMBULATORY_CARE_PROVIDER_SITE_OTHER)
Admission: EM | Admit: 2015-07-08 | Discharge: 2015-07-08 | Disposition: A | Discharge status: Home / Self Care | Payer: Medicare Other | Source: Home / Self Care | Attending: Family Medicine | Admitting: Family Medicine

## 2015-07-08 DIAGNOSIS — K2920 Alcoholic gastritis without bleeding: Secondary | ICD-10-CM | POA: Diagnosis not present

## 2015-07-08 MED ORDER — ONDANSETRON 4 MG PO TBDP
4.0000 mg | ORAL_TABLET | Freq: Once | ORAL | Status: AC
  Administered 2015-07-08: 4 mg via ORAL

## 2015-07-08 MED ORDER — ONDANSETRON 4 MG PO TBDP
ORAL_TABLET | ORAL | Status: AC
  Filled 2015-07-08: qty 1

## 2015-07-08 MED ORDER — GI COCKTAIL ~~LOC~~
ORAL | Status: AC
  Filled 2015-07-08: qty 30

## 2015-07-08 MED ORDER — RANITIDINE HCL 150 MG PO TABS: 150.0000 mg | ORAL_TABLET | Freq: Two times a day (BID) | ORAL | Status: DC

## 2015-07-08 MED ORDER — GI COCKTAIL ~~LOC~~
30.0000 mL | Freq: Once | ORAL | Status: AC
  Administered 2015-07-08: 30 mL via ORAL

## 2015-07-08 NOTE — ED Provider Notes (Signed)
CSN: [id number]     Arrival date & time 07/08/15  1447 History   First MD Initiated Contact with Patient 07/08/15 [id number]     Chief Complaint  Patient presents with  . Emesis   (Consider location/radiation/quality/duration/timing/severity/associated sxs/prior Treatment) Patient is a 55 y.o. male presenting with vomiting. The history is provided by the patient.  Emesis Severity:  Mild Duration:  2 days (sun and mon, none since, took Pepto Bismol,) Quality:  Stomach contents Progression:  Improving Chronicity:  New Relieved by:  Nothing Worsened by:  Nothing tried Ineffective treatments:  None tried Associated symptoms: no abdominal pain and no diarrhea   Associated symptoms comment:  Did party after football game on sun, sx after Risk factors: suspect food intake     Past Medical History  Diagnosis Date  . Diabetes mellitus   . HIV (human immunodeficiency virus infection) ([place])   . Hyperlipidemia   . Hypertension   . Arthritis   . Depression    History reviewed. No pertinent past surgical history. Family History  Problem Relation Age of Onset  . Hypertension Mother    Social History  Substance Use Topics  . Smoking status: Never Smoker   . Smokeless tobacco: Never Used  . Alcohol Use: Yes    Review of Systems  Respiratory: Negative.   Cardiovascular: Negative.   Gastrointestinal: Positive for nausea and vomiting. Negative for abdominal pain, diarrhea, constipation, blood in stool and rectal pain.  Genitourinary: Negative.   Musculoskeletal: Negative.   Skin: Negative.   All other systems reviewed and are negative.   Allergies  Lisinopril  Home Medications   Prior to Admission medications   Medication Sig Start Date End Date Taking? Authorizing Provider  amLODipine (NORVASC) 5 MG tablet Take 1 tablet (5 mg total) by mouth daily. 01/14/15   Truman Hayward, MD  amoxicillin-clavulanate (AUGMENTIN) 875-125 MG tablet Take 1 tablet by mouth 2 (two) times daily. X  7 days 05/23/15   Melynda Ripple, MD  aspirin 81 MG EC tablet Take 1 tablet (81 mg total) by mouth daily. Swallow whole. 09/20/12   Michel Bickers, MD  atorvastatin (LIPITOR) 20 MG tablet Take 1 tablet (20 mg total) by mouth daily. 02/17/15   Michel Bickers, MD  Blood Glucose Monitoring Suppl (ACCU-CHEK AVIVA PLUS) W/DEVICE KIT 1,000 mg by Does not apply route 2 (two) times daily. 06/06/14   Lorayne Marek, MD  cyclobenzaprine (FLEXERIL) 5 MG tablet Take 1 tablet (5 mg total) by mouth at bedtime. 11/10/14   Lorayne Marek, MD  elvitegravir-cobicistat-emtricitabine-tenofovir (GENVOYA) 150-150-200-10 MG TABS tablet Take 1 tablet by mouth daily with breakfast. 05/01/15   Michel Bickers, MD  ezetimibe (ZETIA) 10 MG tablet Take 1 tablet (10 mg total) by mouth daily. 01/13/15   Michel Bickers, MD  fenofibrate (TRICOR) 48 MG tablet Take 1 tablet (48 mg total) by mouth daily. 02/20/15   Tresa Garter, MD  fluticasone (FLONASE) 50 MCG/ACT nasal spray Place 2 sprays into both nostrils daily. 05/23/15   Melynda Ripple, MD  glipiZIDE (GLUCOTROL) 10 MG tablet Take 1 tablet (10 mg total) by mouth daily before breakfast. 04/30/15   Willeen Niece, MD  GlucoCom Lancets MISC Check blood sugar TID & QHS diagnosis 250.00 06/09/14   Lorayne Marek, MD  glucose blood (ACCU-CHEK AVIVA) test strip Use as instructed 06/06/14   Lorayne Marek, MD  glucose blood (CHOICE DM FORA G20 TEST STRIPS) test strip Use as instructed 06/09/14   Lorayne Marek, MD  ibuprofen (ADVIL,MOTRIN)  800 MG tablet Take 1 tablet (800 mg total) by mouth every 8 (eight) hours as needed. 05/23/15   Melynda Ripple, MD  Insulin Pen Needle 31G X 8 MM MISC Check blood sugar TID & QHS 250.00 06/09/14   Lorayne Marek, MD  Lancet Devices (ACCU-CHEK [facility]) lancets Use as instructed 06/06/14   Lorayne Marek, MD  metFORMIN (GLUCOPHAGE) 1000 MG tablet TAKE 1 TABLET BY MOUTH TWICE DAILY WITH A MEAL 04/09/15   Josalyn Funches, MD  ranitidine (ZANTAC) 150 MG tablet Take 1  tablet (150 mg total) by mouth 2 (two) times daily. 07/08/15   Billy Fischer, MD   Meds Ordered and Administered this Visit   Medications  gi cocktail (Maalox,Lidocaine,Donnatal) (30 mLs Oral Given 07/08/15 1642)  ondansetron (ZOFRAN-ODT) disintegrating tablet 4 mg (4 mg Oral Given 07/08/15 1642)    BP 131/86 mmHg  Pulse 91  Temp(Src) 98.3 F (36.8 C) (Oral)  Resp 16  SpO2 99% No data found.   Physical Exam  Constitutional: He is oriented to person, place, and time. He appears well-developed and well-nourished. No distress.  Neck: Normal range of motion. Neck supple.  Pulmonary/Chest: Effort normal and breath sounds normal.  Abdominal: Soft. Bowel sounds are normal. He exhibits no distension and no mass. There is no tenderness. There is no rebound and no guarding.  Lymphadenopathy:    He has no cervical adenopathy.  Neurological: He is alert and oriented to person, place, and time.  Skin: Skin is warm and dry.  Nursing note and vitals reviewed.   ED Course  Procedures (including critical care time)  Labs Review Labs Reviewed - No data to display  Imaging Review No results found.   Visual Acuity Review  Right Eye Distance:   Left Eye Distance:   Bilateral Distance:    Right Eye Near:   Left Eye Near:    Bilateral Near:         MDM   1. 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

## 2015-07-08 NOTE — ED Notes (Signed)
Pt  Reports    abd  Pain with diarrhea      X  5  Days    Vomited  Earlier       Now  Has  Subsided       Pt  Sitting  Upright  On  Exam  Table        Speaking  In complete    sentances

## 2015-07-24 ENCOUNTER — Ambulatory Visit (INDEPENDENT_AMBULATORY_CARE_PROVIDER_SITE_OTHER): Payer: Medicare Other | Admitting: Physician Assistant

## 2015-07-24 VITALS — BP 110/72 | HR 89 | Temp 98.3°F | Resp 16 | Ht 71.0 in | Wt 196.0 lb

## 2015-07-24 DIAGNOSIS — J069 Acute upper respiratory infection, unspecified: Secondary | ICD-10-CM

## 2015-07-24 DIAGNOSIS — B9789 Other viral agents as the cause of diseases classified elsewhere: Principal | ICD-10-CM

## 2015-07-24 MED ORDER — HYDROCOD POLST-CPM POLST ER 10-8 MG/5ML PO SUER: 5.0000 mL | Freq: Two times a day (BID) | ORAL | Status: DC | PRN

## 2015-07-24 MED ORDER — MUCINEX DM MAXIMUM STRENGTH 60-1200 MG PO TB12: 1.0000 | ORAL_TABLET | Freq: Two times a day (BID) | ORAL | Status: DC

## 2015-07-24 NOTE — Progress Notes (Signed)
Benjamin Hunter  MRN: [id number] DOB: [birth year]  Subjective:  Pt presents to clinic for a week h/o cold symptoms.  He started with congestion and cough and sore throat - he has about the same symptoms now that he did when it started.  He is having some green nasal rhinorrhea and coughing up the same colored stuff.  He feels like his cough is mostly from his throat.  Viral load undetected at last visit - 01/2015 CD4 510  Home treatments - cold preps Sick contacts - none  Patient Active Problem List   Diagnosis Date Noted  . Diabetes (HCC) 06/17/2013  . Asymptomatic HIV infection (HCC) 06/17/2013  . Dyslipidemia 06/17/2013  . HTN (hypertension) 06/17/2013  . Depression 01/14/2013  . Rash and nonspecific skin eruption 10/11/2012  . Recurrent low back pain 10/11/2012  . Left facial numbness 08/20/2012  . Seasonal allergies 09/20/2011  . Polyarthritis 09/06/2011  . Diabetes mellitus (HCC) 03/16/2009  . Human immunodeficiency virus (HIV) disease (HCC) 07/17/2008  . Hyperlipidemia 07/17/2008  . Hypertension 07/17/2008    Current Outpatient Prescriptions on File Prior to Visit  Medication Sig Dispense Refill  . amLODipine (NORVASC) 5 MG tablet Take 1 tablet (5 mg total) by mouth daily. 30 tablet 6  . atorvastatin (LIPITOR) 20 MG tablet Take 1 tablet (20 mg total) by mouth daily. 30 tablet 11  . elvitegravir-cobicistat-emtricitabine-tenofovir (GENVOYA) 150-150-200-10 MG TABS tablet Take 1 tablet by mouth daily with breakfast. 30 tablet 11  . ezetimibe (ZETIA) 10 MG tablet Take 1 tablet (10 mg total) by mouth daily. 30 tablet 11  . fenofibrate (TRICOR) 48 MG tablet Take 1 tablet (48 mg total) by mouth daily. 30 tablet 2  . glipiZIDE (GLUCOTROL) 10 MG tablet Take 1 tablet (10 mg total) by mouth daily before breakfast. 30 tablet 3  . glucose blood (ACCU-CHEK AVIVA) test strip Use as instructed 100 each 12  . Lancet Devices (ACCU-CHEK SOFTCLIX) lancets Use as instructed 1 each 0  .  metFORMIN (GLUCOPHAGE) 1000 MG tablet TAKE 1 TABLET BY MOUTH TWICE DAILY WITH A MEAL 60 tablet 0  . fluticasone (FLONASE) 50 MCG/ACT nasal spray Place 2 sprays into both nostrils daily. (Patient not taking: Reported on 07/24/2015) 16 g 0  . ranitidine (ZANTAC) 150 MG tablet Take 1 tablet (150 mg total) by mouth 2 (two) times daily. (Patient not taking: Reported on 07/24/2015) 60 tablet 0   No current facility-administered medications on file prior to visit.    Allergies  Allergen Reactions  . Lisinopril Rash    Palmar rash     Review of Systems  Constitutional: Negative for fever and chills.  HENT: Positive for congestion, postnasal drip, rhinorrhea (green) and sore throat (worse at night).   Respiratory: Positive for cough (green).   Gastrointestinal: Negative.   Musculoskeletal: Negative for myalgias.  Allergic/Immunologic: Negative for environmental allergies.  Neurological: Negative for headaches.   Objective:  BP 110/72 mmHg  Pulse 89  Temp(Src) 98.3 F (36.8 C)  Resp 16  Ht  (1.803 m)  Wt 196 lb (88.905 kg)  BMI 27.35 kg/m2  SpO2 98%  Physical Exam  Constitutional: He is oriented to person, place, and time and well-developed, well-nourished, and in no distress.  HENT:  Head: Normocephalic and atraumatic.  Right Ear: Hearing, tympanic membrane, external ear and ear canal normal.  Left Ear: Hearing, tympanic membrane, external ear and ear canal normal.  Nose: Nose normal.  Mouth/Throat: Uvula is midline, oropharynx is clear and moist and  mucous membranes are normal.  Eyes: Conjunctivae are normal.  Neck: Normal range of motion.  Cardiovascular: Normal rate, regular rhythm and normal heart sounds.   Pulmonary/Chest: Effort normal and breath sounds normal. He has no wheezes.  Lymphadenopathy:       Head (right side): No tonsillar adenopathy present.       Head (left side): No tonsillar adenopathy present.    He has no cervical adenopathy.       Right: No  supraclavicular adenopathy present.       Left: No supraclavicular adenopathy present.  Neurological: He is alert and oriented to person, place, and time. Gait normal.  Skin: Skin is warm and dry.  Psychiatric: Mood, memory, affect and judgment normal.    Assessment and Plan :  Viral URI with cough - Plan: Dextromethorphan-Guaifenesin (MUCINEX DM MAXIMUM STRENGTH) 60-1200 MG TB12, chlorpheniramine-HYDROcodone (TUSSIONEX PENNKINETIC ER) 10-8 MG/5ML SUER, Care order/instruction   Symptomatic treatment d/w pt.  This is still likely viral at this point and we need to change his medications slightly to help with the congestion - if he is not getting better within the next 5-7 days he will call for an abx.  Benny Lennert PA-C  [facility] 07/24/2015 9:54 AM

## 2015-07-24 NOTE — Patient Instructions (Signed)
Please push fluids.  Tylenol and Motrin for fever and body aches.    A humidifier can help especially when the air is dry -if you do not have a humidifier you can boil a pot of water on the stove in your home to help with the dry air.  

## 2015-08-10 ENCOUNTER — Other Ambulatory Visit: Payer: Medicare Other

## 2015-08-10 DIAGNOSIS — Z79899 Other long term (current) drug therapy: Secondary | ICD-10-CM

## 2015-08-10 DIAGNOSIS — B20 Human immunodeficiency virus [HIV] disease: Secondary | ICD-10-CM | POA: Diagnosis not present

## 2015-08-10 LAB — COMPREHENSIVE METABOLIC PANEL
ALBUMIN: 3.9 g/dL (ref 3.6–5.1)
ALT: 15 U/L (ref 9–46)
AST: 15 U/L (ref 10–35)
Alkaline Phosphatase: 63 U/L (ref 40–115)
BILIRUBIN TOTAL: 0.3 mg/dL (ref 0.2–1.2)
BUN: 14 mg/dL (ref 7–25)
CHLORIDE: 106 mmol/L (ref 98–110)
CO2: 23 mmol/L (ref 20–31)
CREATININE: 1.08 mg/dL (ref 0.70–1.33)
Calcium: 9.4 mg/dL (ref 8.6–10.3)
GLUCOSE: 171 mg/dL — AB (ref 65–99)
Potassium: 4.2 mmol/L (ref 3.5–5.3)
SODIUM: 138 mmol/L (ref 135–146)
Total Protein: 7.4 g/dL (ref 6.1–8.1)

## 2015-08-10 LAB — LIPID PANEL
Cholesterol: 235 mg/dL — ABNORMAL HIGH (ref 125–200)
HDL: 46 mg/dL (ref >=40)
LDL CALC: 127 mg/dL (ref <130)
Total CHOL/HDL Ratio: 5.1 Ratio — ABNORMAL HIGH (ref <=5.0)
Triglycerides: 312 mg/dL — ABNORMAL HIGH (ref <150)
VLDL: 62 mg/dL — AB (ref <30)

## 2015-08-10 LAB — CBC
HCT: 39 % (ref 39.0–52.0)
HEMOGLOBIN: 13.3 g/dL (ref 13.0–17.0)
MCH: 30.5 pg (ref 26.0–34.0)
MCHC: 34.1 g/dL (ref 30.0–36.0)
MCV: 89.4 fL (ref 78.0–100.0)
MPV: 11.4 fL (ref 8.6–12.4)
PLATELETS: 184 10*3/uL (ref 150–400)
RBC: 4.36 MIL/uL (ref 4.22–5.81)
RDW: 13.2 % (ref 11.5–15.5)
WBC: 3.9 10*3/uL — AB (ref 4.0–10.5)

## 2015-08-11 DIAGNOSIS — Z79899 Other long term (current) drug therapy: Secondary | ICD-10-CM | POA: Diagnosis not present

## 2015-08-11 LAB — HIV-1 RNA QUANT-NO REFLEX-BLD
HIV 1 RNA Quant: <20 copies/mL (ref <20)
HIV-1 RNA Quant, Log: <1.3 Log copies/mL (ref <1.30)

## 2015-08-11 LAB — T-HELPER CELL (CD4) - (RCID CLINIC ONLY)
CD4 % Helper T Cell: 39 % (ref 33–55)
CD4 T Cell Abs: 540 /uL (ref 400–2700)

## 2015-08-11 LAB — RPR

## 2015-08-24 ENCOUNTER — Ambulatory Visit: Payer: Medicare Other | Admitting: Internal Medicine

## 2015-09-02 ENCOUNTER — Ambulatory Visit (INDEPENDENT_AMBULATORY_CARE_PROVIDER_SITE_OTHER): Payer: Medicare Other | Admitting: Internal Medicine

## 2015-09-02 ENCOUNTER — Encounter: Payer: Self-pay | Admitting: Internal Medicine

## 2015-09-02 VITALS — BP 119/77 | HR 74 | Temp 98.0°F | Wt 202.0 lb

## 2015-09-02 DIAGNOSIS — E785 Hyperlipidemia, unspecified: Secondary | ICD-10-CM | POA: Diagnosis not present

## 2015-09-02 DIAGNOSIS — I10 Essential (primary) hypertension: Secondary | ICD-10-CM

## 2015-09-02 DIAGNOSIS — E0800 Diabetes mellitus due to underlying condition with hyperosmolarity without nonketotic hyperglycemic-hyperosmolar coma (NKHHC): Secondary | ICD-10-CM

## 2015-09-02 DIAGNOSIS — B20 Human immunodeficiency virus [HIV] disease: Secondary | ICD-10-CM

## 2015-09-02 LAB — HEMOGLOBIN A1C
Hgb A1c MFr Bld: 5.9 % — ABNORMAL HIGH (ref <5.7)
Mean Plasma Glucose: 123 mg/dL

## 2015-09-02 MED ORDER — FLUTICASONE PROPIONATE 50 MCG/ACT NA SUSP: 2.0000 | Freq: Every day | NASAL | Status: DC

## 2015-09-02 NOTE — Assessment & Plan Note (Signed)
His hemoglobin A1c was elevated at 9.14 months ago and he has gained weight since that time. He is eating more fast food. I asked him to consider trying to start cooking healthier meals at home. I also asked him to schedule a follow-up visit with his primary care provider.

## 2015-09-02 NOTE — Assessment & Plan Note (Signed)
His HIV infection remains under excellent control. He will continue Genvoya and follow-up after lab work in 6 months. 

## 2015-09-02 NOTE — Progress Notes (Signed)
Patient Active Problem List   Diagnosis Date Noted  . Human immunodeficiency virus (HIV) disease (HCC) 07/17/2008    Priority: High  . Seasonal allergies 09/20/2011    Priority: Medium  . Polyarthritis 09/06/2011    Priority: Medium  . Diabetes mellitus (HCC) 03/16/2009    Priority: Medium  . Hyperlipidemia 07/17/2008    Priority: Medium  . Hypertension 07/17/2008    Priority: Medium  . Depression 01/14/2013  . Rash and nonspecific skin eruption 10/11/2012  . Recurrent low back pain 10/11/2012  . Left facial numbness 08/20/2012    Patient's Medications  New Prescriptions   No medications on file  Previous Medications   AMLODIPINE (NORVASC) 5 MG TABLET    Take 1 tablet (5 mg total) by mouth daily.   ATORVASTATIN (LIPITOR) 20 MG TABLET    Take 1 tablet (20 mg total) by mouth daily.   ELVITEGRAVIR-COBICISTAT-EMTRICITABINE-TENOFOVIR (GENVOYA) 150-150-200-10 MG TABS TABLET    Take 1 tablet by mouth daily with breakfast.   EZETIMIBE (ZETIA) 10 MG TABLET    Take 1 tablet (10 mg total) by mouth daily.   FENOFIBRATE (TRICOR) 48 MG TABLET    Take 1 tablet (48 mg total) by mouth daily.   GLIPIZIDE (GLUCOTROL) 10 MG TABLET    Take 1 tablet (10 mg total) by mouth daily before breakfast.   GLUCOSE BLOOD (ACCU-CHEK AVIVA) TEST STRIP    Use as instructed   LANCET DEVICES (ACCU-CHEK SOFTCLIX) LANCETS    Use as instructed   METFORMIN (GLUCOPHAGE) 1000 MG TABLET    TAKE 1 TABLET BY MOUTH TWICE DAILY WITH A MEAL   RANITIDINE (ZANTAC) 150 MG TABLET    Take 1 tablet (150 mg total) by mouth 2 (two) times daily.  Modified Medications   Modified Medication Previous Medication   FLUTICASONE (FLONASE) 50 MCG/ACT NASAL SPRAY fluticasone (FLONASE) 50 MCG/ACT nasal spray      Place 2 sprays into both nostrils daily.    Place 2 sprays into both nostrils daily.   FLUTICASONE (FLONASE) 50 MCG/ACT NASAL SPRAY fluticasone (FLONASE) 50 MCG/ACT nasal spray      Place 2 sprays into both nostrils  daily.    Place 2 sprays into both nostrils daily.  Discontinued Medications   CHLORPHENIRAMINE-HYDROCODONE (TUSSIONEX PENNKINETIC ER) 10-8 MG/5ML SUER    Take 5 mLs by mouth 2 (two) times daily as needed for cough.   DEXTROMETHORPHAN-GUAIFENESIN (MUCINEX DM MAXIMUM STRENGTH) 60-1200 MG TB12    Take 1 tablet by mouth 2 (two) times daily.    Subjective: Cowan is in for his routine HIV follow-up visit. His mother died in Jun 26, 2022 and he is busy trying to settle her estate. Since she died he is eating much more fast food. He does not have a car so he is walking a lot more but he has gained weight. He states his blood sugars have been running about 115 each morning. He is only missed one dose of his Genvoya in the past 6 months. He takes it each morning with breakfast and has no side effects. His arthritis is not very active at this time. He has not seen his primary care provider recently. He has not been sexually active since his last visit.  Review of Systems: Review of Systems  Constitutional: Negative for fever, chills, weight loss, malaise/fatigue and diaphoresis.  HENT: Positive for congestion. Negative for sore throat.   Respiratory: Negative for cough, sputum production and shortness of breath.  Cardiovascular: Negative for chest pain.  Gastrointestinal: Negative for nausea, vomiting and diarrhea.  Genitourinary: Negative for dysuria and frequency.  Musculoskeletal: Positive for joint pain. Negative for myalgias.  Skin: Negative for rash.  Neurological: Negative for dizziness and headaches.  Psychiatric/Behavioral: Negative for depression and substance abuse. The patient is not nervous/anxious.     Past Medical History  Diagnosis Date  . Diabetes mellitus   . HIV (human immunodeficiency virus infection) (HCC)   . Hyperlipidemia   . Hypertension   . Arthritis   . Depression     Social History  Substance Use Topics  . Smoking status: Never Smoker   . Smokeless tobacco: Never  Used  . Alcohol Use: Yes    Family History  Problem Relation Age of Onset  . Hypertension Mother     Allergies  Allergen Reactions  . Lisinopril Rash    Palmar rash     Objective:  Filed Vitals:   09/02/15 1342  BP: 119/77  Pulse: 74  Temp: 98 F (36.7 C)  TempSrc: Oral  Weight: 202 lb (91.627 kg)   Body mass index is 28.19 kg/(m^2).  Physical Exam  Constitutional: He is oriented to person, place, and time.  He is smiling and in good spirits. He has gained about 16 pounds in the past 3-4 months.  HENT:  Mouth/Throat: No oropharyngeal exudate.  Eyes: Conjunctivae are normal.  Cardiovascular: Normal rate and regular rhythm.   No murmur heard. Pulmonary/Chest: Breath sounds normal.  Abdominal: Soft. He exhibits no mass. There is no tenderness.  Musculoskeletal: Normal range of motion. He exhibits no edema or tenderness.  Neurological: He is alert and oriented to person, place, and time.  Skin: No rash noted.  Psychiatric: Mood and affect normal.    Lab Results Lab Results  Component Value Date   WBC 3.9* 08/10/2015   HGB 13.3 08/10/2015   HCT 39.0 08/10/2015   MCV 89.4 08/10/2015   PLT 184 08/10/2015    Lab Results  Component Value Date   CREATININE 1.08 08/10/2015   BUN 14 08/10/2015   NA 138 08/10/2015   K 4.2 08/10/2015   CL 106 08/10/2015   CO2 23 08/10/2015    Lab Results  Component Value Date   ALT 15 08/10/2015   AST 15 08/10/2015   ALKPHOS 63 08/10/2015   BILITOT 0.3 08/10/2015    Lab Results  Component Value Date   CHOL 235* 08/10/2015   HDL 46 08/10/2015   LDLCALC 127 08/10/2015   TRIG 312* 08/10/2015   CHOLHDL 5.1* 08/10/2015    Lab Results HIV 1 RNA QUANT (copies/mL)  Date Value  08/10/2015 <20  02/10/2015 <20  06/03/2014 <20   CD4 T CELL ABS (/uL)  Date Value  08/10/2015 540  02/10/2015 510  06/03/2014 410      Problem List Items Addressed This Visit      High   Human immunodeficiency virus (HIV) disease (HCC)      His HIV infection remains under excellent control. He will continue Genvoya and follow-up after lab work in 6 months.      Relevant Orders   T-helper cell (CD4)- ([facility] clinic only)   HIV 1 RNA quant-no reflex-bld   Comprehensive metabolic panel     Medium   Diabetes mellitus (HCC)    His hemoglobin A1c was elevated at 9.14 months ago and he has gained weight since that time. He is eating more fast food. I asked him to consider trying to start  cooking healthier meals at home. I also asked him to schedule a follow-up visit with his primary care provider.      Relevant Orders   Hemoglobin A1c   Hyperlipidemia - Primary    His LDL and HDL cholesterol are both normal but his triglycerides remain elevated. Again, talked to him about his dietary habits.      Hypertension    His blood pressure is at goal.           Cliffton AstersJohn Yohance Hathorne, MD [facility] [phone] pager   [phone] cell 09/02/2015, 2:15 PM

## 2015-09-02 NOTE — Assessment & Plan Note (Signed)
His LDL and HDL cholesterol are both normal but his triglycerides remain elevated. Again, talked to him about his dietary habits.

## 2015-09-02 NOTE — Assessment & Plan Note (Signed)
His blood pressure is at goal. 

## 2015-09-03 ENCOUNTER — Ambulatory Visit: Payer: Medicare Other | Admitting: Internal Medicine

## 2015-09-07 ENCOUNTER — Telehealth: Payer: Self-pay

## 2015-09-07 NOTE — Telephone Encounter (Signed)
Called patient to notify him of appointment at [facility] on April 26th at 2:45pm with Dr. Venetia NightAmao. Dr. Orpah CobbAdvani no longer is a provider there at [facility]. Patient requested that an appointment be made because he has not seen his primary care provder in a while. Rejeana Brockandace Murray, LPN

## 2015-09-14 ENCOUNTER — Other Ambulatory Visit: Payer: Self-pay | Admitting: Family Medicine

## 2015-10-05 ENCOUNTER — Ambulatory Visit: Payer: Medicare Other | Admitting: Family Medicine

## 2015-10-18 DIAGNOSIS — R21 Rash and other nonspecific skin eruption: Secondary | ICD-10-CM | POA: Diagnosis not present

## 2015-11-17 ENCOUNTER — Encounter: Payer: Self-pay | Admitting: Family Medicine

## 2015-11-17 ENCOUNTER — Telehealth: Payer: Self-pay

## 2015-11-17 ENCOUNTER — Ambulatory Visit: Payer: Medicare Other | Attending: Family Medicine | Admitting: Family Medicine

## 2015-11-17 VITALS — BP 114/81 | HR 92 | Temp 97.9°F | Resp 14 | Ht 71.0 in | Wt 190.2 lb

## 2015-11-17 DIAGNOSIS — E785 Hyperlipidemia, unspecified: Secondary | ICD-10-CM | POA: Diagnosis not present

## 2015-11-17 DIAGNOSIS — I10 Essential (primary) hypertension: Secondary | ICD-10-CM

## 2015-11-17 DIAGNOSIS — B20 Human immunodeficiency virus [HIV] disease: Secondary | ICD-10-CM

## 2015-11-17 DIAGNOSIS — E119 Type 2 diabetes mellitus without complications: Secondary | ICD-10-CM

## 2015-11-17 LAB — GLUCOSE, POCT (MANUAL RESULT ENTRY)
POC GLUCOSE: 449 mg/dL — AB (ref 70–99)
POC Glucose: 381 mg/dl — AB (ref 70–99)

## 2015-11-17 MED ORDER — ATORVASTATIN CALCIUM 20 MG PO TABS: 20.0000 mg | ORAL_TABLET | Freq: Every day | ORAL | Status: DC

## 2015-11-17 MED ORDER — INSULIN GLARGINE 100 UNIT/ML SOLOSTAR PEN: 10.0000 [IU] | PEN_INJECTOR | Freq: Every day | SUBCUTANEOUS | Status: DC

## 2015-11-17 MED ORDER — INSULIN ASPART 100 UNIT/ML ~~LOC~~ SOLN
10.0000 [IU] | Freq: Once | SUBCUTANEOUS | Status: AC
  Administered 2015-11-17: 10 [IU] via SUBCUTANEOUS

## 2015-11-17 MED ORDER — GLUCOSE BLOOD VI STRP: ORAL_STRIP | Status: DC

## 2015-11-17 MED ORDER — AMLODIPINE BESYLATE 5 MG PO TABS: 5.0000 mg | ORAL_TABLET | Freq: Every day | ORAL | Status: DC

## 2015-11-17 MED ORDER — FENOFIBRATE 48 MG PO TABS: 48.0000 mg | ORAL_TABLET | Freq: Every day | ORAL | Status: DC

## 2015-11-17 MED ORDER — GLIPIZIDE 10 MG PO TABS: 10.0000 mg | ORAL_TABLET | Freq: Two times a day (BID) | ORAL | Status: DC

## 2015-11-17 NOTE — Telephone Encounter (Signed)
Patient called triage requesting LPN call and get an appointment to [facility] because he was running out of diabetic supplies. Called patient to let him know [facility] not [facility] any [facility] until July and he will have to call June 30th to get an appointment. Did notify patient that [facility] does have some cancellation today (06/20) but he would have to call and inquire about the times. Gave [facility] number to [facility]. Rejeana Brockandace Murray, LPN

## 2015-11-17 NOTE — Progress Notes (Signed)
Subjective:  Patient ID: Benjamin Hunter, male    DOB: 04/25/1961  Age: 55 y.o. MRN: [id number]  CC: Diabetes   HPI Benjamin GoodpastureKenneth Ibanez with a history of Type 2 diabetes mellitus (A1c 5.9), hyperlipidmia hypertension, HIV (on antiretroviral therapy, managed by Infectious disease) who comes into the clinic for a follow up visit.  He had complained of right shoulder pain at his last visit which he states has improved significantly with his use of a shoulder brace. He lost his mom 5 months ago and has been busy tidying up.   He complains of abdominal bloating and diarrhea with metformin which led to him taking it once daily rather than twice daily as prescribed. He would like to come off metformin because he has heard "he could affect your kidneys" even though I have reassured him.  Patient has No headache, No chest pain, No abdominal pain - No Nausea, No new weakness tingling or numbness, No Cough - SOB.  Outpatient Prescriptions Prior to Visit  Medication Sig Dispense Refill  . elvitegravir-cobicistat-emtricitabine-tenofovir (GENVOYA) 150-150-200-10 MG TABS tablet Take 1 tablet by mouth daily with breakfast. 30 tablet 11  . ezetimibe (ZETIA) 10 MG tablet Take 1 tablet (10 mg total) by mouth daily. 30 tablet 11  . fluticasone (FLONASE) 50 MCG/ACT nasal spray Place 2 sprays into both nostrils daily. 16 g 11  . fluticasone (FLONASE) 50 MCG/ACT nasal spray Place 2 sprays into both nostrils daily. 16 g 0  . Lancet Devices (ACCU-CHEK SOFTCLIX) lancets Use as instructed 1 each 0  . ranitidine (ZANTAC) 150 MG tablet Take 1 tablet (150 mg total) by mouth 2 (two) times daily. 60 tablet 0  . amLODipine (NORVASC) 5 MG tablet Take 1 tablet (5 mg total) by mouth daily. 30 tablet 6  . atorvastatin (LIPITOR) 20 MG tablet Take 1 tablet (20 mg total) by mouth daily. 30 tablet 11  . fenofibrate (TRICOR) 48 MG tablet Take 1 tablet (48 mg total) by mouth daily. 30 tablet 2  . glipiZIDE (GLUCOTROL) 10 MG tablet  Take 1 tablet (10 mg total) by mouth daily before breakfast. 30 tablet 3  . glucose blood (ACCU-CHEK AVIVA) test strip Use as instructed 100 each 12  . metFORMIN (GLUCOPHAGE) 1000 MG tablet TAKE 1 TABLET BY MOUTH TWICE DAILY WITH A MEAL 60 tablet 0   No facility-administered medications prior to visit.    ROS Review of Systems  Constitutional: Negative for activity change and appetite change.  HENT: Negative for sinus pressure and sore throat.   Eyes: Negative for visual disturbance.  Respiratory: Negative for cough, chest tightness and shortness of breath.   Cardiovascular: Negative for chest pain and leg swelling.  Gastrointestinal: Negative for abdominal pain, diarrhea, constipation and abdominal distention.  Endocrine: Negative.   Genitourinary: Negative for dysuria.  Musculoskeletal:       Mild R shoulder pain  Skin: Negative for rash.  Allergic/Immunologic: Negative.   Neurological: Negative for weakness, light-headedness and numbness.  Psychiatric/Behavioral: Negative for suicidal ideas and dysphoric mood.    Objective:  BP 114/81 mmHg  Pulse 92  Temp(Src) 97.9 F (36.6 C) (Oral)  Resp 14  Ht 5\' 11"  (1.803 m)  Wt 190 lb 3.2 oz (86.274 kg)  BMI 26.54 kg/m2  SpO2 98%  BP/Weight 11/17/2015 09/02/2015 07/24/2015  Systolic BP 114 119 110  Diastolic BP 81 77 72  Wt. (Lbs) 190.2 202 196  BMI 26.54 28.19 27.35      Physical Exam  Constitutional: He is oriented  to person, place, and time. He appears well-developed and well-nourished.  Cardiovascular: Normal rate, normal heart sounds and intact distal pulses.   No murmur heard. Pulmonary/Chest: Effort normal and breath sounds normal. He has no wheezes. He has no rales. He exhibits no tenderness.  Abdominal: Soft. Bowel sounds are normal. He exhibits no distension and no mass. There is no tenderness.  Musculoskeletal: He exhibits no tenderness.  Normal appearance of right shoulder Abduction limited to 120  Neurological:  He is alert and oriented to person, place, and time.     Assessment & Plan:   1. Type 2 diabetes mellitus without complication, unspecified long term insulin use status (HCC) A1c of 5.9 from 08/2015 A1c is likely to be elevated due to noncompliance with medications and dietary indiscretion. NovoLog 10 units administered in the clinic for CBG of 449; patient observed for 30 minutes and CBG repeated Increased glipizide to twice a day dosing Discontinued metformin due to GI side effects We started Lantus 10 units at bedtime (previously discontinued due to patient request) We'll review blood sugar log at next visit - glipiZIDE (GLUCOTROL) 10 MG tablet; Take 1 tablet (10 mg total) by mouth 2 (two) times daily before a meal.  Dispense: 60 tablet; Refill: 3 - Glucose (CBG)  2. Essential hypertension, benign Controlled - amLODipine (NORVASC) 5 MG tablet; Take 1 tablet (5 mg total) by mouth daily.  Dispense: 30 tablet; Refill: 6  3. Hyperlipidemia Uncontrolled Currently on just 20 mg of Lipitor due to interaction with antiretroviral therapy Due for a lipid panel but he informs me this will be done at his visit with infectious disease on 11/26/15  4 Human immunodeficiency virus (HIV) disease (HCC) Currently on antiretroviral therapy Closely followed by infectious disease   Meds ordered this encounter  Medications  . glipiZIDE (GLUCOTROL) 10 MG tablet    Sig: Take 1 tablet (10 mg total) by mouth 2 (two) times daily before a meal.    Dispense:  60 tablet    Refill:  3  . glucose blood (ACCU-CHEK AVIVA) test strip    Sig: Use as instructed    Dispense:  100 each    Refill:  12  . amLODipine (NORVASC) 5 MG tablet    Sig: Take 1 tablet (5 mg total) by mouth daily.    Dispense:  30 tablet    Refill:  6  . atorvastatin (LIPITOR) 20 MG tablet    Sig: Take 1 tablet (20 mg total) by mouth daily.    Dispense:  30 tablet    Refill:  11  . fenofibrate (TRICOR) 48 MG tablet    Sig: Take 1  tablet (48 mg total) by mouth daily.    Dispense:  30 tablet    Refill:  2  . Insulin Glargine (LANTUS SOLOSTAR) 100 UNIT/ML Solostar Pen    Sig: Inject 10 Units into the skin daily at 10 pm.    Dispense:  5 pen    Refill:  3    Discontinue metformin    Follow-up: For follow-up of diabetes mellitus   Jaclyn Shaggy MD

## 2015-11-17 NOTE — Progress Notes (Signed)
Pt here for diabetes.  Pt denies pain today.  Pt needs medication refills and test strips.

## 2015-11-25 ENCOUNTER — Other Ambulatory Visit: Payer: Medicare Other

## 2015-11-25 DIAGNOSIS — B20 Human immunodeficiency virus [HIV] disease: Secondary | ICD-10-CM

## 2015-11-25 LAB — COMPREHENSIVE METABOLIC PANEL
ALBUMIN: 4 g/dL (ref 3.6–5.1)
ALK PHOS: 85 U/L (ref 40–115)
ALT: 12 U/L (ref 9–46)
AST: 14 U/L (ref 10–35)
BILIRUBIN TOTAL: 0.6 mg/dL (ref 0.2–1.2)
BUN: 20 mg/dL (ref 7–25)
CALCIUM: 9.3 mg/dL (ref 8.6–10.3)
CO2: 22 mmol/L (ref 20–31)
Chloride: 98 mmol/L (ref 98–110)
Creat: 1.25 mg/dL (ref 0.70–1.33)
GLUCOSE: 437 mg/dL — AB (ref 65–99)
POTASSIUM: 4.3 mmol/L (ref 3.5–5.3)
Sodium: 128 mmol/L — ABNORMAL LOW (ref 135–146)
TOTAL PROTEIN: 7.6 g/dL (ref 6.1–8.1)

## 2015-11-26 ENCOUNTER — Telehealth: Payer: Self-pay

## 2015-11-26 ENCOUNTER — Other Ambulatory Visit: Payer: Medicare Other

## 2015-11-26 LAB — T-HELPER CELL (CD4) - (RCID CLINIC ONLY)
CD4 T CELL HELPER: 40 % (ref 33–55)
CD4 T Cell Abs: 460 /uL (ref 400–2700)

## 2015-11-26 LAB — HIV-1 RNA QUANT-NO REFLEX-BLD
HIV 1 RNA Quant: <20 copies/mL (ref <20)
HIV-1 RNA Quant, Log: <1.3 Log copies/mL (ref <1.30)

## 2015-11-26 NOTE — Telephone Encounter (Signed)
Spoke with patient, advised him to get back in with PCP.  Patient states he has been drinking soda, that his am blood sugars have been 300's. He states he will call once they open.  He states he knows what to do to at home to correct his sugars.  Will 'cc his pcp.

## 2015-11-26 NOTE — Telephone Encounter (Addendum)
Lab alert reported:  Glucose 437  Repeated and verified.  Labs completed yesterday and ran routine.   Left message on pateint's voice mail to call the office as soon as possible related to medications.

## 2015-11-26 NOTE — Telephone Encounter (Signed)
I note that Benjamin Hunter saw his primary care provider on 11/17/2015. His metformin was stopped and he was started on Lantus insulin. When Benjamin Hunter calls back please instruct him to contact his primary care provider's office to let them know that his blood sugars are still running too high.

## 2015-11-26 NOTE — Telephone Encounter (Deleted)
Left message on pateint's voice mail to call the office as soon as possible related to medications.   Laurell Josephsammy K King, RN

## 2015-12-07 ENCOUNTER — Other Ambulatory Visit: Payer: Self-pay | Admitting: Pharmacist

## 2015-12-07 DIAGNOSIS — E119 Type 2 diabetes mellitus without complications: Secondary | ICD-10-CM

## 2015-12-07 DIAGNOSIS — B20 Human immunodeficiency virus [HIV] disease: Secondary | ICD-10-CM

## 2015-12-07 DIAGNOSIS — I10 Essential (primary) hypertension: Secondary | ICD-10-CM

## 2015-12-07 DIAGNOSIS — E785 Hyperlipidemia, unspecified: Secondary | ICD-10-CM

## 2015-12-07 MED ORDER — ACCU-CHEK SOFTCLIX LANCET DEV MISC: Status: AC

## 2015-12-07 MED ORDER — GLUCOSE BLOOD VI STRP: ORAL_STRIP | Status: DC

## 2015-12-07 MED ORDER — ACCU-CHEK SOFTCLIX LANCETS MISC: Status: DC

## 2015-12-09 ENCOUNTER — Ambulatory Visit: Payer: Medicare Other | Attending: Family Medicine | Admitting: Family Medicine

## 2015-12-09 ENCOUNTER — Encounter: Payer: Self-pay | Admitting: Family Medicine

## 2015-12-09 DIAGNOSIS — B20 Human immunodeficiency virus [HIV] disease: Secondary | ICD-10-CM

## 2015-12-09 DIAGNOSIS — E119 Type 2 diabetes mellitus without complications: Secondary | ICD-10-CM | POA: Diagnosis not present

## 2015-12-09 LAB — POCT GLYCOSYLATED HEMOGLOBIN (HGB A1C): Hemoglobin A1C: 8.9

## 2015-12-09 LAB — GLUCOSE, POCT (MANUAL RESULT ENTRY): POC Glucose: 152 mg/dl — AB (ref 70–99)

## 2015-12-09 MED ORDER — INSULIN GLARGINE 100 UNIT/ML SOLOSTAR PEN: 15.0000 [IU] | PEN_INJECTOR | Freq: Every day | SUBCUTANEOUS | Status: DC

## 2015-12-09 NOTE — Progress Notes (Signed)
Subjective:  Patient ID: Benjamin Hunter, male    DOB: 28-Jul-1960  Age: 55 y.o. MRN: [id number]  CC: Diabetes   HPI Benjamin Hunter is a 55 year old male with a history of hypertension, type 2 diabetes mellitus (A1c 8.9), HIV who comes into the clinic because he was found to have a blood sugar of 437 on blood work ordered by infectious disease.  He informs me he had been binge eating, drinking a lot of sodas and eating cookies due to being under stress from moving and from the passing of his mom. He reports that he has rectified his eating habits and his fasting sugar this morning was 139. He promises to do better on it here with a diabetic diet. He has no other complaints.  Outpatient Prescriptions Prior to Visit  Medication Sig Dispense Refill  . ACCU-CHEK SOFTCLIX LANCETS lancets Use as instructed for 3 times daily testing of blood sugar 100 each 12  . amLODipine (NORVASC) 5 MG tablet Take 1 tablet (5 mg total) by mouth daily. 30 tablet 6  . atorvastatin (LIPITOR) 20 MG tablet Take 1 tablet (20 mg total) by mouth daily. 30 tablet 11  . elvitegravir-cobicistat-emtricitabine-tenofovir (GENVOYA) 150-150-200-10 MG TABS tablet Take 1 tablet by mouth daily with breakfast. 30 tablet 11  . ezetimibe (ZETIA) 10 MG tablet Take 1 tablet (10 mg total) by mouth daily. 30 tablet 11  . fenofibrate (TRICOR) 48 MG tablet Take 1 tablet (48 mg total) by mouth daily. 30 tablet 2  . fluticasone (FLONASE) 50 MCG/ACT nasal spray Place 2 sprays into both nostrils daily. 16 g 11  . glipiZIDE (GLUCOTROL) 10 MG tablet Take 1 tablet (10 mg total) by mouth 2 (two) times daily before a meal. 60 tablet 3  . glucose blood (ACCU-CHEK AVIVA) test strip Use as instructed for 3 times daily testing of blood sugar 100 each 12  . Lancet Devices (ACCU-CHEK SOFTCLIX) lancets Use as instructed for 3 times daily testing of blood sugar. 1 each 0  . Insulin Glargine (LANTUS SOLOSTAR) 100 UNIT/ML Solostar Pen Inject 10 Units into  the skin daily at 10 pm. 5 pen 3  . ranitidine (ZANTAC) 150 MG tablet Take 1 tablet (150 mg total) by mouth 2 (two) times daily. (Patient not taking: Reported on 12/09/2015) 60 tablet 0  . fluticasone (FLONASE) 50 MCG/ACT nasal spray Place 2 sprays into both nostrils daily. 16 g 0   No facility-administered medications prior to visit.    ROS Review of Systems  Objective:  BP 120/77 mmHg  Pulse 83  Temp(Src) 97.9 F (36.6 C) (Oral)  Ht  (1.803 m)  Wt 190 lb 9.6 oz (86.456 kg)  BMI 26.60 kg/m2  SpO2 100%  BP/Weight 12/09/2015 11/17/2015 09/02/2015  Systolic BP 120 114 119  Diastolic BP 77 81 77  Wt. (Lbs) 190.6 190.2 202  BMI 26.6 26.54 28.19      Physical Exam  Constitutional: He is oriented to person, place, and time. He appears well-developed and well-nourished.  Cardiovascular: Normal rate, normal heart sounds and intact distal pulses.   No murmur heard. Pulmonary/Chest: Effort normal and breath sounds normal. He has no wheezes. He has no rales. He exhibits no tenderness.  Abdominal: Soft. Bowel sounds are normal. He exhibits no distension and no mass. There is no tenderness.  Musculoskeletal: Normal range of motion.  Neurological: He is alert and oriented to person, place, and time.       Lab Results  Component Value Date  HGBA1C 8.9 12/09/2015    Assessment & Plan:   1. Type 2 diabetes mellitus without complication, unspecified long term insulin use status (HCC) A1c 8.9 This is due to noncompliance. Lantus increased to 15 units at bedtime - POCT glucose (manual entry) - Insulin Glargine (LANTUS SOLOSTAR) 100 UNIT/ML Solostar Pen; Inject 15 Units into the skin daily at 10 pm.  Dispense: 5 pen; Refill: 3 - POCT glycosylated hemoglobin (Hb A1C)  2. Human immunodeficiency virus (HIV) disease (HCC) Currently on antiretrovirals therapy Keep appointment with infectious disease.   Meds ordered this encounter  Medications  . Insulin Glargine (LANTUS  SOLOSTAR) 100 UNIT/ML Solostar Pen    Sig: Inject 15 Units into the skin daily at 10 pm.    Dispense:  5 pen    Refill:  3    Discontinue previous dose    Follow-up: Return in about 3 months (around 03/10/2016) for Follow-up on diabetes mellitus.   Jaclyn ShaggyEnobong Amao MD

## 2015-12-09 NOTE — Patient Instructions (Signed)
Diabetes Mellitus and Food It is important for you to manage your blood sugar (glucose) level. Your blood glucose level can be greatly affected by what you eat. Eating healthier foods in the appropriate amounts throughout the day at about the same time each day will help you control your blood glucose level. It can also help slow or prevent worsening of your diabetes mellitus. Healthy eating may even help you improve the level of your blood pressure and reach or maintain a healthy weight.  General recommendations for healthful eating and cooking habits include:  Eating meals and snacks regularly. Avoid going long periods of time without eating to lose weight.  Eating a diet that consists mainly of plant-based foods, such as fruits, vegetables, nuts, legumes, and whole grains.  Using low-heat cooking methods, such as baking, instead of high-heat cooking methods, such as deep frying. Work with your dietitian to make sure you understand how to use the Nutrition Facts information on food labels. HOW CAN FOOD AFFECT ME? Carbohydrates Carbohydrates affect your blood glucose level more than any other type of food. Your dietitian will help you determine how many carbohydrates to eat at each meal and teach you how to count carbohydrates. Counting carbohydrates is important to keep your blood glucose at a healthy level, especially if you are using insulin or taking certain medicines for diabetes mellitus. Alcohol Alcohol can cause sudden decreases in blood glucose (hypoglycemia), especially if you use insulin or take certain medicines for diabetes mellitus. Hypoglycemia can be a life-threatening condition. Symptoms of hypoglycemia (sleepiness, dizziness, and disorientation) are similar to symptoms of having too much alcohol.  If your health care provider has given you approval to drink alcohol, do so in moderation and use the following guidelines:  Women should not have more than one drink per day, and men  should not have more than two drinks per day. One drink is equal to:  12 oz of beer.  5 oz of wine.  1 oz of hard liquor.  Do not drink on an empty stomach.  Keep yourself hydrated. Have water, diet soda, or unsweetened iced tea.  Regular soda, juice, and other mixers might contain a lot of carbohydrates and should be counted. WHAT FOODS ARE NOT RECOMMENDED? As you make food choices, it is important to remember that all foods are not the same. Some foods have fewer nutrients per serving than other foods, even though they might have the same number of calories or carbohydrates. It is difficult to get your body what it needs when you eat foods with fewer nutrients. Examples of foods that you should avoid that are high in calories and carbohydrates but low in nutrients include:  Trans fats (most processed foods list trans fats on the Nutrition Facts label).  Regular soda.  Juice.  Candy.  Sweets, such as cake, pie, doughnuts, and cookies.  Fried foods. WHAT FOODS CAN I EAT? Eat nutrient-rich foods, which will nourish your body and keep you healthy. The food you should eat also will depend on several factors, including:  The calories you need.  The medicines you take.  Your weight.  Your blood glucose level.  Your blood pressure level.  Your cholesterol level. You should eat a variety of foods, including:  Protein.  Lean cuts of meat.  Proteins low in saturated fats, such as fish, egg whites, and beans. Avoid processed meats.  Fruits and vegetables.  Fruits and vegetables that may help control blood glucose levels, such as apples, mangoes, and   yams.  Dairy products.  Choose fat-free or low-fat dairy products, such as milk, yogurt, and cheese.  Grains, bread, pasta, and rice.  Choose whole grain products, such as multigrain bread, whole oats, and brown rice. These foods may help control blood pressure.  Fats.  Foods containing healthful fats, such as nuts,  avocado, olive oil, canola oil, and fish. DOES EVERYONE WITH DIABETES MELLITUS HAVE THE SAME MEAL PLAN? Because every person with diabetes mellitus is different, there is not one meal plan that works for everyone. It is very important that you meet with a dietitian who will help you create a meal plan that is just right for you.   This information is not intended to replace advice given to you by your health care provider. Make sure you discuss any questions you have with your health care provider.   Document Released: 02/10/2005 Document Revised: 06/06/2014 Document Reviewed: 04/12/2013 Elsevier Interactive Patient Education 2016 [employer]  

## 2015-12-16 ENCOUNTER — Ambulatory Visit (INDEPENDENT_AMBULATORY_CARE_PROVIDER_SITE_OTHER): Payer: Medicare Other | Admitting: Internal Medicine

## 2015-12-16 ENCOUNTER — Encounter: Payer: Self-pay | Admitting: Internal Medicine

## 2015-12-16 ENCOUNTER — Ambulatory Visit: Payer: Medicare Other | Admitting: *Deleted

## 2015-12-16 VITALS — BP 116/63 | HR 75 | Temp 97.4°F | Wt 190.0 lb

## 2015-12-16 DIAGNOSIS — F1910 Other psychoactive substance abuse, uncomplicated: Secondary | ICD-10-CM

## 2015-12-16 DIAGNOSIS — B20 Human immunodeficiency virus [HIV] disease: Secondary | ICD-10-CM

## 2015-12-16 DIAGNOSIS — Z79899 Other long term (current) drug therapy: Secondary | ICD-10-CM

## 2015-12-16 NOTE — Progress Notes (Signed)
Patient Active Problem List   Diagnosis Date Noted  . Human immunodeficiency virus (HIV) disease (HCC) 07/17/2008    Priority: High  . Seasonal allergies 09/20/2011    Priority: Medium  . Polyarthritis 09/06/2011    Priority: Medium  . Diabetes mellitus (HCC) 03/16/2009    Priority: Medium  . Hyperlipidemia 07/17/2008    Priority: Medium  . Hypertension 07/17/2008    Priority: Medium  . Depression 01/14/2013  . Rash and nonspecific skin eruption 10/11/2012  . Recurrent low back pain 10/11/2012  . Left facial numbness 08/20/2012    Patient's Medications  New Prescriptions   No medications on file  Previous Medications   ACCU-CHEK SOFTCLIX LANCETS LANCETS    Use as instructed for 3 times daily testing of blood sugar   AMLODIPINE (NORVASC) 5 MG TABLET    Take 1 tablet (5 mg total) by mouth daily.   ATORVASTATIN (LIPITOR) 20 MG TABLET    Take 1 tablet (20 mg total) by mouth daily.   ELVITEGRAVIR-COBICISTAT-EMTRICITABINE-TENOFOVIR (GENVOYA) 150-150-200-10 MG TABS TABLET    Take 1 tablet by mouth daily with breakfast.   EZETIMIBE (ZETIA) 10 MG TABLET    Take 1 tablet (10 mg total) by mouth daily.   FENOFIBRATE (TRICOR) 48 MG TABLET    Take 1 tablet (48 mg total) by mouth daily.   FLUTICASONE (FLONASE) 50 MCG/ACT NASAL SPRAY    Place 2 sprays into both nostrils daily.   GLIPIZIDE (GLUCOTROL) 10 MG TABLET    Take 1 tablet (10 mg total) by mouth 2 (two) times daily before a meal.   GLUCOSE BLOOD (ACCU-CHEK AVIVA) TEST STRIP    Use as instructed for 3 times daily testing of blood sugar   INSULIN GLARGINE (LANTUS SOLOSTAR) 100 UNIT/ML SOLOSTAR PEN    Inject 15 Units into the skin daily at 10 pm.   LANCET DEVICES (ACCU-CHEK SOFTCLIX) LANCETS    Use as instructed for 3 times daily testing of blood sugar.   RANITIDINE (ZANTAC) 150 MG TABLET    Take 1 tablet (150 mg total) by mouth 2 (two) times daily.  Modified Medications   No medications on file  Discontinued Medications   No medications on file    Subjective: Benjamin Hunter is in for his routine HIV follow-up visit. As usual he never misses a single dose of his Genvoya. He states that he now has a house and is living alone and finds it much less stressful than it when he was living with his nephew. He is drinking fewer sodas and his blood sugar has come under much better control in the past few weeks. His arthritis is under good control and he is working to get his cholesterol under better control.   Review of Systems: Review of Systems  Constitutional: Negative for fever, chills, weight loss, malaise/fatigue and diaphoresis.  Respiratory: Negative for cough, sputum production and shortness of breath.   Cardiovascular: Negative for chest pain.  Musculoskeletal: Negative for myalgias and joint pain.  Skin: Negative for rash.  Neurological: Negative for headaches.    Past Medical History  Diagnosis Date  . Diabetes mellitus   . HIV (human immunodeficiency virus infection) (HCC)   . Hyperlipidemia   . Hypertension   . Arthritis   . Depression     Social History  Substance Use Topics  . Smoking status: Never Smoker   . Smokeless tobacco: Never Used  . Alcohol Use: Yes     Comment: occasional  Family History  Problem Relation Age of Onset  . Hypertension Mother     Allergies  Allergen Reactions  . Lisinopril Rash    Palmar rash     Objective:  Filed Vitals:   12/16/15 1348  BP: 116/63  Pulse: 75  Temp: 97.4 F (36.3 C)  TempSrc: Oral  Weight: 190 lb (86.183 kg)   Body mass index is 26.51 kg/(m^2).  Physical Exam  Constitutional: He is oriented to person, place, and time. No distress.  He is in good spirits as usual.  Cardiovascular: Normal rate and regular rhythm.   No murmur heard. Pulmonary/Chest: Effort normal and breath sounds normal.  Musculoskeletal: Normal range of motion. He exhibits no edema or tenderness.  Neurological: He is alert and oriented to person, place, and  time.  Skin: No rash noted.  Psychiatric: Mood and affect normal.    Lab Results Lab Results  Component Value Date   WBC 3.9* 08/10/2015   HGB 13.3 08/10/2015   HCT 39.0 08/10/2015   MCV 89.4 08/10/2015   PLT 184 08/10/2015    Lab Results  Component Value Date   CREATININE 1.25 11/25/2015   BUN 20 11/25/2015   NA 128* 11/25/2015   K 4.3 11/25/2015   CL 98 11/25/2015   CO2 22 11/25/2015    Lab Results  Component Value Date   ALT 12 11/25/2015   AST 14 11/25/2015   ALKPHOS 85 11/25/2015   BILITOT 0.6 11/25/2015    Lab Results  Component Value Date   CHOL 235* 08/10/2015   HDL 46 08/10/2015   LDLCALC 127 08/10/2015   TRIG 312* 08/10/2015   CHOLHDL 5.1* 08/10/2015   HIV 1 RNA QUANT (copies/mL)  Date Value  11/25/2015 <20  08/10/2015 <20  02/10/2015 <20   CD4 T CELL ABS (/uL)  Date Value  11/25/2015 460  08/10/2015 540  02/10/2015 510     Problem List Items Addressed This Visit      High   Human immunodeficiency virus (HIV) disease (HCC)   Relevant Orders   T-helper cell (CD4)- ([facility] clinic only)   HIV 1 RNA quant-no reflex-bld   CBC   Comprehensive metabolic panel   Lipid panel   RPR    Other Visit Diagnoses    Encounter for long-term (current) use of medications    -  Primary    Relevant Orders    Lipid panel         Cliffton AstersJohn Aislinn Feliz, MD [facility] [phone] pager   [phone] cell 12/16/2015, 2:11 PM

## 2015-12-16 NOTE — Assessment & Plan Note (Signed)
His HIV infection remains under excellent control. He will continue Genvoya and follow-up after lab work in 6 months. 

## 2015-12-16 NOTE — BH Specialist Note (Signed)
Counselor met with Benjamin Hunter today in the exam room for a warm hand off. Patient was oriented times four with good affect. Counselor introduced self and shared with patient about counseling services that were offered to him. Patient was alert and somewhat talkative.  Patient indicated that he did not need any services right now however, after saying that he inquired about the counseling services such as times, counselors, and place.  Counselor recommended that patient make an appointment when he checked out today and or he could call counselor directly to set up an appointment.  Patient said he would think about it. Counselor provided support for patient accordingly.   Rolena Infante MA, LPC [facility]

## 2015-12-17 ENCOUNTER — Ambulatory Visit: Payer: Medicare Other | Admitting: Internal Medicine

## 2016-02-12 ENCOUNTER — Other Ambulatory Visit: Payer: Self-pay | Admitting: *Deleted

## 2016-02-12 DIAGNOSIS — E785 Hyperlipidemia, unspecified: Secondary | ICD-10-CM

## 2016-02-12 MED ORDER — EZETIMIBE 10 MG PO TABS: 10.0000 mg | ORAL_TABLET | Freq: Every day | ORAL | 11 refills | Status: DC

## 2016-02-17 ENCOUNTER — Ambulatory Visit (INDEPENDENT_AMBULATORY_CARE_PROVIDER_SITE_OTHER): Payer: Medicare Other

## 2016-02-17 DIAGNOSIS — Z23 Encounter for immunization: Secondary | ICD-10-CM

## 2016-02-24 ENCOUNTER — Encounter: Payer: Self-pay | Admitting: Family Medicine

## 2016-02-24 ENCOUNTER — Ambulatory Visit: Payer: Medicare Other | Attending: Family Medicine | Admitting: Family Medicine

## 2016-02-24 ENCOUNTER — Other Ambulatory Visit: Payer: Self-pay

## 2016-02-24 VITALS — BP 107/68 | HR 78 | Temp 97.8°F | Ht 71.0 in | Wt 204.4 lb

## 2016-02-24 DIAGNOSIS — M1990 Unspecified osteoarthritis, unspecified site: Secondary | ICD-10-CM | POA: Insufficient documentation

## 2016-02-24 DIAGNOSIS — R0789 Other chest pain: Secondary | ICD-10-CM

## 2016-02-24 DIAGNOSIS — E0800 Diabetes mellitus due to underlying condition with hyperosmolarity without nonketotic hyperglycemic-hyperosmolar coma (NKHHC): Secondary | ICD-10-CM

## 2016-02-24 DIAGNOSIS — B20 Human immunodeficiency virus [HIV] disease: Secondary | ICD-10-CM | POA: Diagnosis not present

## 2016-02-24 DIAGNOSIS — E785 Hyperlipidemia, unspecified: Secondary | ICD-10-CM | POA: Insufficient documentation

## 2016-02-24 DIAGNOSIS — F329 Major depressive disorder, single episode, unspecified: Secondary | ICD-10-CM | POA: Insufficient documentation

## 2016-02-24 DIAGNOSIS — Z794 Long term (current) use of insulin: Secondary | ICD-10-CM | POA: Insufficient documentation

## 2016-02-24 DIAGNOSIS — I10 Essential (primary) hypertension: Secondary | ICD-10-CM | POA: Diagnosis not present

## 2016-02-24 DIAGNOSIS — B079 Viral wart, unspecified: Secondary | ICD-10-CM

## 2016-02-24 DIAGNOSIS — M62830 Muscle spasm of back: Secondary | ICD-10-CM

## 2016-02-24 DIAGNOSIS — I440 Atrioventricular block, first degree: Secondary | ICD-10-CM | POA: Diagnosis not present

## 2016-02-24 DIAGNOSIS — I8393 Asymptomatic varicose veins of bilateral lower extremities: Secondary | ICD-10-CM | POA: Diagnosis not present

## 2016-02-24 DIAGNOSIS — I8390 Asymptomatic varicose veins of unspecified lower extremity: Secondary | ICD-10-CM

## 2016-02-24 LAB — GLUCOSE, POCT (MANUAL RESULT ENTRY): POC Glucose: 55 mg/dl — AB (ref 70–99)

## 2016-02-24 MED ORDER — INSULIN PEN NEEDLE 31G X 5 MM MISC: 1.0000 | Freq: Every day | 5 refills | Status: DC

## 2016-02-24 NOTE — Progress Notes (Signed)
Patients states needs a refill on  [employer] pen needles  Patients wants Dr. To look at veins on leg and wart on right thumb.

## 2016-02-24 NOTE — Patient Instructions (Signed)
Varicose Veins Varicose veins are veins that have become enlarged and twisted. They are usually seen in the legs but can occur in other parts of the body as well. CAUSES This condition is the result of valves in the veins not working properly. Valves in the veins help to return blood from the leg to the heart. If these valves are damaged, blood flows backward and backs up into the veins in the leg near the skin. This causes the veins to become larger. RISK FACTORS People who are on their feet a lot, who are pregnant, or who are overweight are more likely to develop varicose veins. SIGNS AND SYMPTOMS  Bulging, twisted-appearing, bluish veins, most commonly found on the legs.  Leg pain or a feeling of heaviness. These symptoms may be worse at the end of the day.  Leg swelling.  Changes in skin color. DIAGNOSIS A health care provider can usually diagnose varicose veins by examining your legs. Your health care provider may also recommend an ultrasound of your leg veins. TREATMENT Most varicose veins can be treated at home.However, other treatments are available for people who have persistent symptoms or want to improve the cosmetic appearance of the varicose veins. These treatment options include:  Sclerotherapy. A solution is injected into the vein to close it off.  Laser treatment. A laser is used to heat the vein to close it off.  Radiofrequency vein ablation. An electrical current produced by radio waves is used to close off the vein.  Phlebectomy. The vein is surgically removed through small incisions made over the varicose vein.  Vein ligation and stripping. The vein is surgically removed through incisions made over the varicose vein after the vein has been tied (ligated). HOME CARE INSTRUCTIONS  Do not stand or sit in one position for long periods of time. Do not sit with your legs crossed. Rest with your legs raised during the day.  Wear compression stockings as directed by your  health care provider. These stockings help to prevent blood clots and reduce swelling in your legs.  Do not wear other tight, encircling garments around your legs, pelvis, or waist.  Walk as much as possible to increase blood flow.  Raise the foot of your bed at night with 2-inch blocks.  If you get a cut in the skin over the vein and the vein bleeds, lie down with your leg raised and press on it with a clean cloth until the bleeding stops. Then place a bandage (dressing) on the cut. See your health care provider if it continues to bleed. SEEK MEDICAL CARE IF:  The skin around your ankle starts to break down.  You have pain, redness, tenderness, or hard swelling in your leg over a vein.  You are uncomfortable because of leg pain.   This information is not intended to replace advice given to you by your health care provider. Make sure you discuss any questions you have with your health care provider.   Document Released: 02/23/2005 Document Revised: 06/06/2014 Document Reviewed: 10/01/2013 Elsevier Interactive Patient Education 2016 [employer]  

## 2016-02-25 NOTE — Progress Notes (Signed)
Subjective:  Patient ID: Benjamin Hunter, male    DOB: 1960/12/01  Age: 55 y.o. MRN: [id number]  CC: wart, varicose veins  HPI Benjamin GoodpastureKenneth Hunter is a 55 year old male with a history of hypertension, type 2 diabetes mellitus (A1c 8.9), HIV who comes into the clinic for a follow-up visit.  He complains of a chronic wart on his right thumb which is not tender but he would like to have excised. He denies change in size or shape and wart does not bleed. He also has varicose veins in his legs which do not hurt and would like to know if they are dangerous.  Noticed occasional right-sided chest pain when he walks which is usually about 1 mile a day but denies shortness of breath or wheezing and this feels like a pinch.  Has been compliant with a diabetic diet and his medications and is requesting refills of pen needles. He reports his blood sugars has been in the 80-120 range and he denies hypoglycemia. Closely followed by infectious disease.   Past Medical History:  Diagnosis Date  . Arthritis   . Depression   . Diabetes mellitus   . HIV (human immunodeficiency virus infection) (HCC)   . Hyperlipidemia   . Hypertension     No past surgical history on file.   Outpatient Medications Prior to Visit  Medication Sig Dispense Refill  . ACCU-CHEK SOFTCLIX LANCETS lancets Use as instructed for 3 times daily testing of blood sugar 100 each 12  . amLODipine (NORVASC) 5 MG tablet Take 1 tablet (5 mg total) by mouth daily. 30 tablet 6  . atorvastatin (LIPITOR) 20 MG tablet Take 1 tablet (20 mg total) by mouth daily. 30 tablet 11  . elvitegravir-cobicistat-emtricitabine-tenofovir (GENVOYA) 150-150-200-10 MG TABS tablet Take 1 tablet by mouth daily with breakfast. 30 tablet 11  . ezetimibe (ZETIA) 10 MG tablet Take 1 tablet (10 mg total) by mouth daily. 30 tablet 11  . fenofibrate (TRICOR) 48 MG tablet Take 1 tablet (48 mg total) by mouth daily. 30 tablet 2  . fluticasone (FLONASE) 50 MCG/ACT nasal  spray Place 2 sprays into both nostrils daily. 16 g 11  . glipiZIDE (GLUCOTROL) 10 MG tablet Take 1 tablet (10 mg total) by mouth 2 (two) times daily before a meal. 60 tablet 3  . glucose blood (ACCU-CHEK AVIVA) test strip Use as instructed for 3 times daily testing of blood sugar 100 each 12  . Insulin Glargine (LANTUS SOLOSTAR) 100 UNIT/ML Solostar Pen Inject 15 Units into the skin daily at 10 pm. 5 pen 3  . Lancet Devices (ACCU-CHEK SOFTCLIX) lancets Use as instructed for 3 times daily testing of blood sugar. 1 each 0  . ranitidine (ZANTAC) 150 MG tablet Take 1 tablet (150 mg total) by mouth 2 (two) times daily. 60 tablet 0   No facility-administered medications prior to visit.     ROS Review of Systems  Constitutional: Negative for activity change and appetite change.  HENT: Negative for sinus pressure and sore throat.   Eyes: Negative for visual disturbance.  Respiratory: Negative for cough, chest tightness and shortness of breath.   Cardiovascular: Positive for chest pain. Negative for leg swelling.  Gastrointestinal: Negative for abdominal distention, abdominal pain, constipation and diarrhea.  Endocrine: Negative.   Genitourinary: Negative for dysuria.  Musculoskeletal: Negative for joint swelling and myalgias.  Skin: Positive for rash.  Allergic/Immunologic: Negative.   Neurological: Negative for weakness, light-headedness and numbness.  Psychiatric/Behavioral: Negative for dysphoric mood and suicidal ideas.  Objective:  BP 107/68 (BP Location: Right Arm, Patient Position: Sitting, Cuff Size: Normal)   Pulse 78   Temp 97.8 F (36.6 C) (Oral)   Ht 5\' 11"  (1.803 m)   Wt 204 lb 6.4 oz (92.7 kg)   SpO2 98%   BMI 28.51 kg/m   BP/Weight 02/24/2016 12/16/2015 12/09/2015  Systolic BP 107 116 120  Diastolic BP 68 63 77  Wt. (Lbs) 204.4 190 190.6  BMI 28.51 26.51 26.6       Physical Exam  Constitutional: He is oriented to person, place, and time. He appears  well-developed and well-nourished.  Cardiovascular: Normal rate, normal heart sounds and intact distal pulses.   No murmur heard. Pulmonary/Chest: Effort normal and breath sounds normal. He has no wheezes. He has no rales. He exhibits no tenderness.  Abdominal: Soft. Bowel sounds are normal. He exhibits no distension and no mass. There is no tenderness.  Musculoskeletal: Normal range of motion.  Varicose veins bilaterally  Neurological: He is alert and oriented to person, place, and time.  Skin:  Right hand with wart on the medial aspect of right hand, which is not tender     Lab Results  Component Value Date   HGBA1C 8.9 12/09/2015    Assessment & Plan:   1. Diabetes mellitus due to underlying condition with hyperosmolarity without coma, without long-term current use of insulin (HCC) Uncontrolled with A1c of 8.9. Blood sugar is improving so I will make no changes to regimen Diabetic visit at next office visit - Glucose (CBG) - Insulin Pen Needle 31G X 5 MM MISC; 1 each by Does not apply route at bedtime.  Dispense: 30 each; Refill: 5  2. Other chest pain Likely musculoskeletal EKG reveals first-degree AV block-compared to previous EKG on this finding is not new  3. Wart Will need excision - Ambulatory referral to Dermatology  4. Varicose vein of leg Patient reassured He is not taking surgical intervention at this time  5. Muscle spasm of back Discussed stretching exercises Apply heat Use OTC NSAIDs his symptoms persist  6. Essential hypertension Controlled   Meds ordered this encounter  Medications  . Insulin Pen Needle 31G X 5 MM MISC    Sig: 1 each by Does not apply route at bedtime.    Dispense:  30 each    Refill:  5    Follow-up: Return in about 3 months (around 05/25/2016) for Diabetes mellitus.   Jaclyn Shaggy MD

## 2016-03-09 ENCOUNTER — Encounter: Payer: Self-pay | Admitting: Internal Medicine

## 2016-03-26 ENCOUNTER — Other Ambulatory Visit: Payer: Self-pay | Admitting: Family Medicine

## 2016-03-26 DIAGNOSIS — B20 Human immunodeficiency virus [HIV] disease: Secondary | ICD-10-CM

## 2016-03-26 DIAGNOSIS — I10 Essential (primary) hypertension: Secondary | ICD-10-CM

## 2016-03-26 DIAGNOSIS — E785 Hyperlipidemia, unspecified: Secondary | ICD-10-CM

## 2016-03-26 DIAGNOSIS — E119 Type 2 diabetes mellitus without complications: Secondary | ICD-10-CM

## 2016-04-28 ENCOUNTER — Other Ambulatory Visit: Payer: Medicare Other

## 2016-04-28 DIAGNOSIS — Z79899 Other long term (current) drug therapy: Secondary | ICD-10-CM

## 2016-04-28 DIAGNOSIS — B20 Human immunodeficiency virus [HIV] disease: Secondary | ICD-10-CM | POA: Diagnosis not present

## 2016-04-28 LAB — COMPREHENSIVE METABOLIC PANEL
ALK PHOS: 55 U/L (ref 40–115)
ALT: 18 U/L (ref 9–46)
AST: 19 U/L (ref 10–35)
Albumin: 4 g/dL (ref 3.6–5.1)
BILIRUBIN TOTAL: 0.5 mg/dL (ref 0.2–1.2)
BUN: 17 mg/dL (ref 7–25)
CALCIUM: 9.2 mg/dL (ref 8.6–10.3)
CO2: 24 mmol/L (ref 20–31)
Chloride: 108 mmol/L (ref 98–110)
Creat: 1.19 mg/dL (ref 0.70–1.33)
GLUCOSE: 66 mg/dL (ref 65–99)
Potassium: 3.9 mmol/L (ref 3.5–5.3)
Sodium: 139 mmol/L (ref 135–146)
Total Protein: 7.4 g/dL (ref 6.1–8.1)

## 2016-04-28 LAB — CBC
HCT: 37 % — ABNORMAL LOW (ref 38.5–50.0)
Hemoglobin: 12.2 g/dL — ABNORMAL LOW (ref 13.2–17.1)
MCH: 29.9 pg (ref 27.0–33.0)
MCHC: 33 g/dL (ref 32.0–36.0)
MCV: 90.7 fL (ref 80.0–100.0)
MPV: 11.5 fL (ref 7.5–12.5)
PLATELETS: 171 10*3/uL (ref 140–400)
RBC: 4.08 MIL/uL — ABNORMAL LOW (ref 4.20–5.80)
RDW: 13.1 % (ref 11.0–15.0)
WBC: 4.1 10*3/uL (ref 3.8–10.8)

## 2016-04-28 LAB — LIPID PANEL
CHOL/HDL RATIO: 3.9 ratio (ref <5.0)
Cholesterol: 169 mg/dL (ref <200)
HDL: 43 mg/dL (ref >40)
LDL CALC: 105 mg/dL — AB (ref <100)
Triglycerides: 107 mg/dL (ref <150)
VLDL: 21 mg/dL (ref <30)

## 2016-04-29 LAB — RPR

## 2016-04-29 LAB — T-HELPER CELL (CD4) - (RCID CLINIC ONLY)
CD4 T CELL HELPER: 38 % (ref 33–55)
CD4 T Cell Abs: 660 /uL (ref 400–2700)

## 2016-05-02 LAB — HIV-1 RNA QUANT-NO REFLEX-BLD

## 2016-05-06 ENCOUNTER — Other Ambulatory Visit: Payer: Self-pay | Admitting: Internal Medicine

## 2016-05-06 DIAGNOSIS — B20 Human immunodeficiency virus [HIV] disease: Secondary | ICD-10-CM

## 2016-05-06 MED ORDER — ELVITEG-COBIC-EMTRICIT-TENOFAF 150-150-200-10 MG PO TABS: 1.0000 | ORAL_TABLET | Freq: Every day | ORAL | 11 refills | Status: DC

## 2016-05-11 ENCOUNTER — Encounter: Payer: Self-pay | Admitting: Internal Medicine

## 2016-05-11 ENCOUNTER — Ambulatory Visit (INDEPENDENT_AMBULATORY_CARE_PROVIDER_SITE_OTHER): Payer: Medicare Other | Admitting: Internal Medicine

## 2016-05-11 ENCOUNTER — Other Ambulatory Visit: Payer: Self-pay | Admitting: Family Medicine

## 2016-05-11 DIAGNOSIS — E881 Lipodystrophy, not elsewhere classified: Secondary | ICD-10-CM

## 2016-05-11 DIAGNOSIS — M130 Polyarthritis, unspecified: Secondary | ICD-10-CM

## 2016-05-11 DIAGNOSIS — B20 Human immunodeficiency virus [HIV] disease: Secondary | ICD-10-CM | POA: Diagnosis present

## 2016-05-11 DIAGNOSIS — E119 Type 2 diabetes mellitus without complications: Secondary | ICD-10-CM

## 2016-05-11 DIAGNOSIS — E785 Hyperlipidemia, unspecified: Secondary | ICD-10-CM

## 2016-05-11 DIAGNOSIS — E0800 Diabetes mellitus due to underlying condition with hyperosmolarity without nonketotic hyperglycemic-hyperosmolar coma (NKHHC): Secondary | ICD-10-CM

## 2016-05-11 DIAGNOSIS — I10 Essential (primary) hypertension: Secondary | ICD-10-CM

## 2016-05-11 NOTE — Assessment & Plan Note (Signed)
His lipids are under much better control.

## 2016-05-11 NOTE — Assessment & Plan Note (Signed)
His infection remains under excellent long-term control. He will continue Genvoya in follow-up after lab work in 6 months.

## 2016-05-11 NOTE — Assessment & Plan Note (Signed)
His polyarthritis is in remission. He only rarely needs to take NSAIDs.

## 2016-05-11 NOTE — Assessment & Plan Note (Signed)
His A1c is still above goal as of last July. I encouraged him to continue to work on lifestyle modification and get a follow-up visit with his primary care provider.

## 2016-05-11 NOTE — Progress Notes (Signed)
Patient Active Problem List   Diagnosis Date Noted  . Human immunodeficiency virus (HIV) disease (HCC) 07/17/2008    Priority: High  . 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 Seasonal allergies 09/20/2011    Priority: Medium  . Polyarthritis 09/06/2011    Priority: Medium  . Diabetes mellitus (HCC) 03/16/2009    Priority: Medium  . Hyperlipidemia 07/17/2008    Priority: Medium  . Hypertension 07/17/2008    Priority: Medium  . 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 Depression 01/14/2013  . Rash and nonspecific skin eruption 10/11/2012  . Recurrent low back pain 10/11/2012  . Left facial numbness 08/20/2012    Patient's Medications  New Prescriptions   No medications on file  Previous Medications   ACCU-CHEK SOFTCLIX LANCETS LANCETS    Use as instructed for 3 times daily testing of blood sugar   AMLODIPINE (NORVASC) 5 MG TABLET    Take 1 tablet (5 mg total) by mouth daily.   ATORVASTATIN (LIPITOR) 20 MG TABLET    Take 1 tablet (20 mg total) by mouth daily.   ELVITEGRAVIR-COBICISTAT-EMTRICITABINE-TENOFOVIR (GENVOYA) 150-150-200-10 MG TABS TABLET    Take 1 tablet by mouth daily with breakfast.   EZETIMIBE (ZETIA) 10 MG TABLET    Take 1 tablet (10 mg total) by mouth daily.   FENOFIBRATE (TRICOR) 48 MG TABLET    TAKE 1 TABLET(48 MG) BY MOUTH DAILY   FLUTICASONE (FLONASE) 50 MCG/ACT NASAL SPRAY    Place 2 sprays into both nostrils daily.   GLIPIZIDE (GLUCOTROL) 10 MG TABLET    Take 1 tablet (10 mg total) by mouth 2 (two) times daily before a meal.   GLUCOSE BLOOD (ACCU-CHEK AVIVA) TEST STRIP    Use as instructed for 3 times daily testing of blood sugar   INSULIN GLARGINE (LANTUS SOLOSTAR) 100 UNIT/ML SOLOSTAR PEN    Inject 15 Units into the skin daily at 10 pm.   INSULIN PEN NEEDLE 31G X 5 MM MISC    1 each by Does not apply route at bedtime.   LANCET DEVICES (ACCU-CHEK SOFTCLIX) LANCETS    Use as instructed for 3 times daily testing of blood sugar.   RANITIDINE  (ZANTAC) 150 MG TABLET    Take 1 tablet (150 mg total) by mouth 2 (two) times daily.  Modified Medications   No medications on file  Discontinued Medications   No medications on file    Subjective: Benjamin Hunter is in for his routine HIV follow-up visit. He recalls missing only 1 or 2 doses of his Genvoya when he forgot to take it before leaving home in the morning. He wasn't sure if he should take it later in the afternoon. He is concerned about his belly fat and some swelling under his arms. He says he is working hard on his diet and trying to cut out fried foods. He states that his blood sugars have been running between 100-150. He does not know what his last hemoglobin A1c is.   Review of Systems: Review of Systems  Constitutional: Negative for chills, diaphoresis, fever, malaise/fatigue and weight loss.  HENT: Negative for sore throat.   Respiratory: Negative for cough, sputum production and shortness of breath.   Cardiovascular: Negative for chest pain.  Gastrointestinal: Negative for abdominal pain, diarrhea, heartburn, nausea and vomiting.       As noted in history of present illness.  Genitourinary: Negative for dysuria and frequency.  Musculoskeletal: Negative for joint  pain and myalgias.  Skin: Negative for rash.  Neurological: Negative for dizziness and headaches.  Psychiatric/Behavioral: Negative for depression and substance abuse. The patient is not nervous/anxious.     Past Medical History:  Diagnosis Date  . Arthritis   . Depression   . Diabetes mellitus   . HIV (human immunodeficiency virus infection) (HCC)   . Hyperlipidemia   . Hypertension     Social History  Substance Use Topics  . Smoking status: Never Smoker  . Smokeless tobacco: Never Used  . Alcohol use Yes     Comment: occasional     Family History  Problem Relation Age of Onset  . Hypertension Mother     Allergies  Allergen Reactions  . Lisinopril Rash    Palmar rash      Objective:  Vitals:   05/11/16 1355  BP: 122/82  Pulse: 70  Temp: 98.5 F (36.9 C)  TempSrc: Oral  Weight: 199 lb (90.3 kg)  Height: 5\' 11"  (1.803 m)   Body mass index is 27.75 kg/m.  Physical Exam  Constitutional: He is oriented to person, place, and time.  He is in good spirits as usual.  HENT:  Mouth/Throat: No oropharyngeal exudate.  Eyes: Conjunctivae are normal.  Cardiovascular: Normal rate and regular rhythm.   No murmur heard. Pulmonary/Chest: Effort normal and breath sounds normal. He has no wheezes. He has no rales.  Both axilla are prominent with fatty deposits. No firm masses or adenopathy are palpable.  Abdominal: Soft. He exhibits no mass. There is no tenderness.  He has central adiposity.  Musculoskeletal: Normal range of motion.  Neurological: He is alert and oriented to person, place, and time.  Skin: No rash noted.  Psychiatric: Mood and affect normal.    Lab Results Lab Results  Component Value Date   WBC 4.1 04/28/2016   HGB 12.2 (L) 04/28/2016   HCT 37.0 (L) 04/28/2016   MCV 90.7 04/28/2016   PLT 171 04/28/2016    Lab Results  Component Value Date   CREATININE 1.19 04/28/2016   BUN 17 04/28/2016   NA 139 04/28/2016   K 3.9 04/28/2016   CL 108 04/28/2016   CO2 24 04/28/2016    Lab Results  Component Value Date   ALT 18 04/28/2016   AST 19 04/28/2016   ALKPHOS 55 04/28/2016   BILITOT 0.5 04/28/2016    Lab Results  Component Value Date   CHOL 169 04/28/2016   HDL 43 04/28/2016   LDLCALC 105 (H) 04/28/2016   TRIG 107 04/28/2016   CHOLHDL 3.9 04/28/2016   HIV 1 RNA Quant (copies/mL)  Date Value  04/28/2016 <20  11/25/2015 <20  08/10/2015 <20   CD4 T Cell Abs (/uL)  Date Value  04/28/2016 660  11/25/2015 460  08/10/2015 540     Problem List Items Addressed This Visit      High   Human immunodeficiency virus (HIV) disease (HCC)    His infection remains under excellent long-term control. He will continue Genvoya in  follow-up after lab work in 6 months.      Relevant Orders   T-helper cell (CD4)- ([facility] clinic only)   HIV 1 RNA quant-no reflex-bld   CBC   Comprehensive metabolic panel   RPR     Medium   Diabetes mellitus (HCC)    His A1c is still above goal as of last July. I encouraged him to continue to work on lifestyle modification and get a follow-up visit with his primary  care provider.      Hyperlipidemia    His lipids are under much better control.      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      Polyarthritis    His polyarthritis is in remission. He only rarely needs to take NSAIDs.           [name], MD [facility] [phone] pager   [phone] cell 05/11/2016, 2:27 PM

## 2016-05-11 NOTE — Assessment & Plan Note (Signed)
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

## 2016-06-01 ENCOUNTER — Other Ambulatory Visit: Payer: Medicare Other

## 2016-06-07 ENCOUNTER — Other Ambulatory Visit: Payer: Self-pay | Admitting: Family Medicine

## 2016-06-07 DIAGNOSIS — E119 Type 2 diabetes mellitus without complications: Secondary | ICD-10-CM

## 2016-06-07 DIAGNOSIS — B20 Human immunodeficiency virus [HIV] disease: Secondary | ICD-10-CM

## 2016-06-07 DIAGNOSIS — I10 Essential (primary) hypertension: Secondary | ICD-10-CM

## 2016-06-07 DIAGNOSIS — E785 Hyperlipidemia, unspecified: Secondary | ICD-10-CM

## 2016-06-15 ENCOUNTER — Ambulatory Visit: Payer: Medicare Other | Admitting: Internal Medicine

## 2016-06-20 ENCOUNTER — Ambulatory Visit: Payer: Medicare Other | Attending: Family Medicine | Admitting: Family Medicine

## 2016-06-20 ENCOUNTER — Encounter: Payer: Self-pay | Admitting: Family Medicine

## 2016-06-20 VITALS — BP 118/77 | HR 88 | Temp 98.0°F | Ht 71.0 in | Wt 205.0 lb

## 2016-06-20 DIAGNOSIS — E7800 Pure hypercholesterolemia, unspecified: Secondary | ICD-10-CM | POA: Diagnosis not present

## 2016-06-20 DIAGNOSIS — Z5189 Encounter for other specified aftercare: Secondary | ICD-10-CM | POA: Diagnosis not present

## 2016-06-20 DIAGNOSIS — Z79899 Other long term (current) drug therapy: Secondary | ICD-10-CM | POA: Diagnosis not present

## 2016-06-20 DIAGNOSIS — E119 Type 2 diabetes mellitus without complications: Secondary | ICD-10-CM | POA: Insufficient documentation

## 2016-06-20 DIAGNOSIS — Z794 Long term (current) use of insulin: Secondary | ICD-10-CM | POA: Insufficient documentation

## 2016-06-20 DIAGNOSIS — B20 Human immunodeficiency virus [HIV] disease: Secondary | ICD-10-CM | POA: Insufficient documentation

## 2016-06-20 DIAGNOSIS — I10 Essential (primary) hypertension: Secondary | ICD-10-CM | POA: Insufficient documentation

## 2016-06-20 LAB — POCT GLYCOSYLATED HEMOGLOBIN (HGB A1C): Hemoglobin A1C: 6.6

## 2016-06-20 LAB — GLUCOSE, POCT (MANUAL RESULT ENTRY): POC GLUCOSE: 202 mg/dL — AB (ref 70–99)

## 2016-06-20 MED ORDER — INSULIN GLARGINE 100 UNIT/ML SOLOSTAR PEN: 10.0000 [IU] | PEN_INJECTOR | Freq: Every day | SUBCUTANEOUS | 3 refills | Status: DC

## 2016-06-20 MED ORDER — ATORVASTATIN CALCIUM 20 MG PO TABS: 20.0000 mg | ORAL_TABLET | Freq: Every day | ORAL | 1 refills | Status: DC

## 2016-06-20 MED ORDER — GLIPIZIDE 10 MG PO TABS: ORAL_TABLET | ORAL | 1 refills | Status: DC

## 2016-06-20 MED ORDER — FENOFIBRATE 48 MG PO TABS: ORAL_TABLET | ORAL | 1 refills | Status: DC

## 2016-06-20 MED ORDER — AMLODIPINE BESYLATE 5 MG PO TABS: ORAL_TABLET | ORAL | 1 refills | Status: DC

## 2016-06-20 NOTE — Progress Notes (Signed)
Taking lantus 10 units nightly

## 2016-06-20 NOTE — Progress Notes (Signed)
Subjective:  Patient ID: Benjamin Hunter, male    DOB: 08/12/1960  Age: 56 y.o. MRN: [id number]  CC: Diabetes   HPI Benjamin Hunter is a 56 year old male with a history of hypertension, type 2 diabetes mellitus (A1c 6.6), HIV who comes into the clinic for a follow-up visit.  He has been compliant with his medications and had to reduce Lantus from 15 units to 10 units due to low fasting sugars. He has had episodes of hypoglycemia of 60 during the day. Denies hypoglycemia, numbness in extremities.  He has been compliant with his antihypertensive and his statin and low-sodium diet. Does not exercise but is planning on joining a gym.  He is closely followed by infectious disease and is currently on antiretrovirals therapy.    Past Medical History:  Diagnosis Date  . Arthritis   . Depression   . Diabetes mellitus   . HIV (human immunodeficiency virus infection) (HCC)   . Hyperlipidemia   . Hypertension     History reviewed. No pertinent surgical history.  Allergies  Allergen Reactions  . Lisinopril Rash    Palmar rash      Outpatient Medications Prior to Visit  Medication Sig Dispense Refill  . ACCU-CHEK SOFTCLIX LANCETS lancets Use as instructed for 3 times daily testing of blood sugar 100 each 12  . elvitegravir-cobicistat-emtricitabine-tenofovir (GENVOYA) 150-150-200-10 MG TABS tablet Take 1 tablet by mouth daily with breakfast. 30 tablet 11  . ezetimibe (ZETIA) 10 MG tablet Take 1 tablet (10 mg total) by mouth daily. 30 tablet 11  . fluticasone (FLONASE) 50 MCG/ACT nasal spray Place 2 sprays into both nostrils daily. 16 g 11  . glucose blood (ACCU-CHEK AVIVA) test strip Use as instructed for 3 times daily testing of blood sugar 100 each 12  . Insulin Pen Needle 31G X 5 MM MISC 1 each by Does not apply route at bedtime. 30 each 5  . Lancet Devices (ACCU-CHEK SOFTCLIX) lancets Use as instructed for 3 times daily testing of blood sugar. 1 each 0  . amLODipine (NORVASC)  5 MG tablet TAKE 1 TABLET(5 MG) BY MOUTH DAILY 90 tablet 0  . atorvastatin (LIPITOR) 20 MG tablet Take 1 tablet (20 mg total) by mouth daily. 30 tablet 11  . fenofibrate (TRICOR) 48 MG tablet TAKE 1 TABLET(48 MG) BY MOUTH DAILY 90 tablet 0  . glipiZIDE (GLUCOTROL) 10 MG tablet TAKE 1 TABLET(10 MG) BY MOUTH TWICE DAILY BEFORE A MEAL 180 tablet 0  . Insulin Glargine (LANTUS SOLOSTAR) 100 UNIT/ML Solostar Pen Inject 15 Units into the skin daily at 10 pm. 5 pen 3  . ranitidine (ZANTAC) 150 MG tablet Take 1 tablet (150 mg total) by mouth 2 (two) times daily. (Patient not taking: Reported on 06/20/2016) 60 tablet 0   No facility-administered medications prior to visit.     ROS Review of Systems  Constitutional: Negative for activity change and appetite change.  HENT: Negative for sinus pressure and sore throat.   Eyes: Negative for visual disturbance.  Respiratory: Negative for cough, chest tightness and shortness of breath.   Cardiovascular: Negative for chest pain and leg swelling.  Gastrointestinal: Negative for abdominal distention, abdominal pain, constipation and diarrhea.  Endocrine: Negative.   Genitourinary: Negative for dysuria.  Musculoskeletal: Negative for joint swelling and myalgias.  Skin: Negative for rash.  Allergic/Immunologic: Negative.   Neurological: Negative for weakness, light-headedness and numbness.  Psychiatric/Behavioral: Negative for dysphoric mood and suicidal ideas.    Objective:  BP 118/77 (BP Location: Right  Arm, Patient Position: Sitting, Cuff Size: Large)   Pulse 88   Temp 98 F (36.7 C) (Oral)   Ht 5\' 11"  (1.803 m)   Wt 205 lb (93 kg)   SpO2 99%   BMI 28.59 kg/m   BP/Weight 06/20/2016 05/11/2016 02/24/2016  Systolic BP 118 122 107  Diastolic BP 77 82 68  Wt. (Lbs) 205 199 204.4  BMI 28.59 27.75 28.51      Physical Exam  Constitutional: He is oriented to person, place, and time. He appears well-developed and well-nourished.  Cardiovascular:  Normal rate, normal heart sounds and intact distal pulses.   No murmur heard. Pulmonary/Chest: Effort normal and breath sounds normal. He has no wheezes. He has no rales. He exhibits no tenderness.  Abdominal: Soft. Bowel sounds are normal. He exhibits no distension and no mass. There is no tenderness.  Musculoskeletal: Normal range of motion.  Neurological: He is alert and oriented to person, place, and time.     Lab Results  Component Value Date   HGBA1C 6.6 06/20/2016    Lipid Panel     Component Value Date/Time   CHOL 169 04/28/2016 1050   TRIG 107 04/28/2016 1050   HDL 43 04/28/2016 1050   CHOLHDL 3.9 04/28/2016 1050   VLDL 21 04/28/2016 1050   LDLCALC 105 (H) 04/28/2016 1050    CMP Latest Ref Rng & Units 04/28/2016 11/25/2015 08/10/2015  Glucose 65 - 99 mg/dL 66 161(W) 960(A)  BUN 7 - 25 mg/dL 17 20 14   Creatinine 0.70 - 1.33 mg/dL 5.40 9.81 1.91  Sodium 135 - 146 mmol/L 139 128(L) 138  Potassium 3.5 - 5.3 mmol/L 3.9 4.3 4.2  Chloride 98 - 110 mmol/L 108 98 106  CO2 20 - 31 mmol/L 24 22 23   Calcium 8.6 - 10.3 mg/dL 9.2 9.3 9.4  Total Protein 6.1 - 8.1 g/dL 7.4 7.6 7.4  Total Bilirubin 0.2 - 1.2 mg/dL 0.5 0.6 0.3  Alkaline Phos 40 - 115 U/L 55 85 63  AST 10 - 35 U/L 19 14 15   ALT 9 - 46 U/L 18 12 15     Assessment & Plan:   1. Type 2 diabetes mellitus without complication, with long-term current use of insulin (HCC) Improved significantly with A1c of 6.6 which is down from 8.9 previously Reduced dose of Lantus from 15 units to 10 units and the patient educated to reduce by 2 units in the event of hypoglycemia Commended on improvement and efforts on lifestyle modification. - Glucose (CBG) - HgB A1c - Microalbumin / creatinine urine ratio - glipiZIDE (GLUCOTROL) 10 MG tablet; TAKE 1 TABLET(10 MG) BY MOUTH TWICE DAILY BEFORE A MEAL  Dispense: 180 tablet; Refill: 1 - Insulin Glargine (LANTUS SOLOSTAR) 100 UNIT/ML Solostar Pen; Inject 10 Units into the skin daily at 10  pm.  Dispense: 5 pen; Refill: 3  2. Essential hypertension, benign Controlled - amLODipine (NORVASC) 5 MG tablet; TAKE 1 TABLET(5 MG) BY MOUTH DAILY  Dispense: 90 tablet; Refill: 1  3. Pure hypercholesterolemia LDL is 105 which is slightly above goal of <100 - fenofibrate (TRICOR) 48 MG tablet; TAKE 1 TABLET(48 MG) BY MOUTH DAILY  Dispense: 90 tablet; Refill: 1 - atorvastatin (LIPITOR) 20 MG tablet; Take 1 tablet (20 mg total) by mouth daily.  Dispense: 90 tablet; Refill: 1  4. Human immunodeficiency virus (HIV) disease (HCC) Currently on ART Keep close follow-up with infectious disease.   Meds ordered this encounter  Medications  . glipiZIDE (GLUCOTROL) 10 MG tablet  Sig: TAKE 1 TABLET(10 MG) BY MOUTH TWICE DAILY BEFORE A MEAL    Dispense:  180 tablet    Refill:  1    **Patient requests 90 days supply**  . fenofibrate (TRICOR) 48 MG tablet    Sig: TAKE 1 TABLET(48 MG) BY MOUTH DAILY    Dispense:  90 tablet    Refill:  1  . atorvastatin (LIPITOR) 20 MG tablet    Sig: Take 1 tablet (20 mg total) by mouth daily.    Dispense:  90 tablet    Refill:  1  . amLODipine (NORVASC) 5 MG tablet    Sig: TAKE 1 TABLET(5 MG) BY MOUTH DAILY    Dispense:  90 tablet    Refill:  1    **Patient requests 90 days supply**  . Insulin Glargine (LANTUS SOLOSTAR) 100 UNIT/ML Solostar Pen    Sig: Inject 10 Units into the skin daily at 10 pm.    Dispense:  5 pen    Refill:  3    Discontinue previous dose    Follow-up: Return in about 3 months (around 09/18/2016) for Follow-up on diabetes mellitus.   Jaclyn ShaggyEnobong Amao MD

## 2016-06-21 ENCOUNTER — Other Ambulatory Visit: Payer: Self-pay | Admitting: Family Medicine

## 2016-06-21 LAB — MICROALBUMIN / CREATININE URINE RATIO
CREATININE, URINE: 112 mg/dL (ref 20–370)
MICROALB UR: 10 mg/dL
Microalb Creat Ratio: 89 mcg/mg creat — ABNORMAL HIGH (ref <30)

## 2016-07-02 ENCOUNTER — Encounter (HOSPITAL_COMMUNITY): Payer: Self-pay | Admitting: Family Medicine

## 2016-07-02 ENCOUNTER — Ambulatory Visit (HOSPITAL_COMMUNITY)
Admission: EM | Admit: 2016-07-02 | Discharge: 2016-07-02 | Disposition: A | Discharge status: Home / Self Care | Payer: Medicare Other | Attending: Family Medicine | Admitting: Family Medicine

## 2016-07-02 DIAGNOSIS — J069 Acute upper respiratory infection, unspecified: Secondary | ICD-10-CM | POA: Diagnosis not present

## 2016-07-02 DIAGNOSIS — B9789 Other viral agents as the cause of diseases classified elsewhere: Secondary | ICD-10-CM

## 2016-07-02 MED ORDER — IPRATROPIUM BROMIDE 0.06 % NA SOLN: 2.0000 | Freq: Four times a day (QID) | NASAL | 12 refills | Status: DC

## 2016-07-02 NOTE — ED Triage Notes (Signed)
Pt here for sinus congestion x 3 days.

## 2016-07-02 NOTE — Discharge Instructions (Signed)
You most likely have a viral URI, I advise rest, plenty of fluids and management of symptoms with over the counter medicines. For symptoms you may take Tylenol as needed every 4-6 hours for body aches or fever, not to exceed 4,000 mg a day, Take mucinex or mucinex DM ever 12 hours with a full glass of water, you may use an inhaled steroid such as Flonase, 2 sprays each nostril once a day for congestion, or an antihistamine such as Claritin or Zyrtec once a day. In addition to these therapies, I have sent a prescription to your pharmacy for ipratropium nasal spray, do 2 sprays up to 4 times a day as needed for runny nose.   Should your symptoms worsen or fail to resolve, follow up with your primary care provider or return to clinic.

## 2016-07-02 NOTE — ED Provider Notes (Signed)
CSN: [id number]     Arrival date & time 07/02/16  1202 History   First MD Initiated Contact with Patient 07/02/16 1216     Chief Complaint  Patient presents with  . Facial Pain   (Consider location/radiation/quality/duration/timing/severity/associated sxs/prior Treatment) 3 day history of congestion. No fever, nausea, vomiting, diarrhea, muscle aches, or body aches. No sore throat, no ear pain or pressure. No appetite change, weakness, or fatigue. Has been taking guaifenesin at home. Also denies cough.    The history is provided by the patient.    Past Medical History:  Diagnosis Date  . Arthritis   . Depression   . Diabetes mellitus   . HIV (human immunodeficiency virus infection) (HCC)   . Hyperlipidemia   . Hypertension    History reviewed. No pertinent surgical history. Family History  Problem Relation Age of Onset  . Hypertension Mother    Social History  Substance Use Topics  . Smoking status: Never Smoker  . Smokeless tobacco: Never Used  . Alcohol use 0.6 - 1.2 oz/week    1 - 2 Cans of beer per week     Comment: occasional     Review of Systems  Reason unable to perform ROS: as covered in HPI.  All other systems reviewed and are negative.   Allergies  Lisinopril  Home Medications   Prior to Admission medications   Medication Sig Start Date End Date Taking? Authorizing Provider  ACCU-CHEK SOFTCLIX LANCETS lancets Use as instructed for 3 times daily testing of blood sugar 12/07/15   Jaclyn ShaggyEnobong Amao, MD  amLODipine (NORVASC) 5 MG tablet TAKE 1 TABLET(5 MG) BY MOUTH DAILY 06/20/16   Jaclyn ShaggyEnobong Amao, MD  atorvastatin (LIPITOR) 20 MG tablet Take 1 tablet (20 mg total) by mouth daily. 06/20/16   Jaclyn ShaggyEnobong Amao, MD  elvitegravir-cobicistat-emtricitabine-tenofovir (GENVOYA) 150-150-200-10 MG TABS tablet Take 1 tablet by mouth daily with breakfast. 05/06/16   Cliffton AstersJohn Campbell, MD  ezetimibe (ZETIA) 10 MG tablet Take 1 tablet (10 mg total) by mouth daily. 02/12/16   Cliffton AstersJohn Campbell, MD   fenofibrate (TRICOR) 48 MG tablet TAKE 1 TABLET(48 MG) BY MOUTH DAILY 06/20/16   Jaclyn ShaggyEnobong Amao, MD  fluticasone (FLONASE) 50 MCG/ACT nasal spray Place 2 sprays into both nostrils daily. 09/02/15   Cliffton AstersJohn Campbell, MD  glipiZIDE (GLUCOTROL) 10 MG tablet TAKE 1 TABLET(10 MG) BY MOUTH TWICE DAILY BEFORE A MEAL 06/20/16   Jaclyn ShaggyEnobong Amao, MD  glucose blood (ACCU-CHEK AVIVA) test strip Use as instructed for 3 times daily testing of blood sugar 12/07/15   Jaclyn ShaggyEnobong Amao, MD  Insulin Glargine (LANTUS SOLOSTAR) 100 UNIT/ML Solostar Pen Inject 10 Units into the skin daily at 10 pm. 06/20/16   Jaclyn ShaggyEnobong Amao, MD  Insulin Pen Needle 31G X 5 MM MISC 1 each by Does not apply route at bedtime. 02/24/16   Jaclyn ShaggyEnobong Amao, MD  ipratropium (ATROVENT) 0.06 % nasal spray Place 2 sprays into both nostrils 4 (four) times daily. 07/02/16   Dorena BodoLawrence Alahni Varone, NP  Lancet Devices [facility](ACCU-CHEK SOFTCLIX) lancets Use as instructed for 3 times daily testing of blood sugar. 12/07/15   Jaclyn ShaggyEnobong Amao, MD  ranitidine (ZANTAC) 150 MG tablet Take 1 tablet (150 mg total) by mouth 2 (two) times daily. Patient not taking: Reported on 06/20/2016 07/08/15   Linna HoffJames D Kindl, MD   Meds Ordered and Administered this Visit  Medications - No data to display  BP 134/80   Pulse 78   Temp 97.9 F (36.6 C)   Resp 18   SpO2  100%  No data found.   Physical Exam  Constitutional: He is oriented to person, place, and time. He appears well-developed and well-nourished. No distress.  HENT:  Head: Normocephalic and atraumatic.  Right Ear: Tympanic membrane and external ear normal.  Left Ear: Tympanic membrane and external ear normal.  Nose: Nose normal. Right sinus exhibits no maxillary sinus tenderness and no frontal sinus tenderness. Left sinus exhibits no maxillary sinus tenderness and no frontal sinus tenderness.  Mouth/Throat: Uvula is midline and oropharynx is clear and moist. No oropharyngeal exudate.  Eyes: Pupils are equal, round, and reactive to light.  Neck:  Normal range of motion. Neck supple. No JVD present.  Cardiovascular: Normal rate and regular rhythm.   Pulmonary/Chest: Effort normal and breath sounds normal. No respiratory distress. He has no wheezes.  Abdominal: Soft. Bowel sounds are normal.  Lymphadenopathy:       Head (right side): No submental, no submandibular and no tonsillar adenopathy present.       Head (left side): No submental, no submandibular and no tonsillar adenopathy present.    He has no cervical adenopathy.  Neurological: He is alert and oriented to person, place, and time.  Skin: Skin is warm and dry. Capillary refill takes less than 2 seconds. No rash noted. He is not diaphoretic. No erythema.  Psychiatric: He has a normal mood and affect.  Nursing note and vitals reviewed.   Urgent Care Course     Procedures (including critical care time)  Labs Review Labs Reviewed - No data to display  Imaging Review No results found.   Visual Acuity Review  Right Eye Distance:   Left Eye Distance:   Bilateral Distance:    Right Eye Near:   Left Eye Near:    Bilateral Near:         MDM   1. Viral URI   You most likely have a viral URI, I advise rest, plenty of fluids and management of symptoms with over the counter medicines. For symptoms you may take Tylenol as needed every 4-6 hours for body aches or fever, not to exceed 4,000 mg a day, Take mucinex or mucinex DM ever 12 hours with a full glass of water, you may use an inhaled steroid such as Flonase, 2 sprays each nostril once a day for congestion, or an antihistamine such as Claritin or Zyrtec once a day. In addition to these therapies, I have sent a prescription to your pharmacy for ipratropium nasal spray, do 2 sprays up to 4 times a day as needed for runny nose.   Should your symptoms worsen or fail to resolve, follow up with your primary care provider or return to clinic.      Dorena Bodo, NP 07/02/16 1231

## 2016-07-12 ENCOUNTER — Encounter (HOSPITAL_COMMUNITY): Payer: Self-pay | Admitting: Emergency Medicine

## 2016-07-12 ENCOUNTER — Ambulatory Visit (HOSPITAL_COMMUNITY)
Admission: EM | Admit: 2016-07-12 | Discharge: 2016-07-12 | Disposition: A | Discharge status: Home / Self Care | Payer: Medicare Other | Attending: Family Medicine | Admitting: Family Medicine

## 2016-07-12 DIAGNOSIS — G44209 Tension-type headache, unspecified, not intractable: Secondary | ICD-10-CM | POA: Diagnosis not present

## 2016-07-12 MED ORDER — METHYLPREDNISOLONE 4 MG PO TBPK: ORAL_TABLET | ORAL | 0 refills | Status: DC

## 2016-07-12 MED ORDER — CYCLOBENZAPRINE HCL 10 MG PO TABS: 10.0000 mg | ORAL_TABLET | Freq: Every day | ORAL | 0 refills | Status: DC

## 2016-07-12 MED ORDER — KETOROLAC TROMETHAMINE 60 MG/2ML IM SOLN
60.0000 mg | Freq: Once | INTRAMUSCULAR | Status: AC
  Administered 2016-07-12: 60 mg via INTRAMUSCULAR

## 2016-07-12 MED ORDER — KETOROLAC TROMETHAMINE 60 MG/2ML IM SOLN
INTRAMUSCULAR | Status: AC
  Filled 2016-07-12: qty 2

## 2016-07-12 NOTE — ED Triage Notes (Signed)
Pt. Stated, I've had a headache for 3 days and Im not sure if its my BP or sinus,. I was just sick last week, and need to follow up, I had a respiratory virus.

## 2016-07-12 NOTE — ED Provider Notes (Signed)
CSN: [id number]     Arrival date & time 07/12/16  1921 History   None    Chief Complaint  Patient presents with  . Headache  . Follow-up   (Consider location/radiation/quality/duration/timing/severity/associated sxs/prior Treatment) Patient c/o tension quality headaches for 3 days.     The history is provided by the patient.  Headache  Pain location:  Generalized Radiates to:  Does not radiate Severity currently:  7/10 Severity at highest:  9/10 Onset quality:  Gradual Duration:  3 days Timing:  Constant Progression:  Worsening Chronicity:  New Similar to prior headaches: no   Relieved by:  None tried Worsened by:  Nothing   Past Medical History:  Diagnosis Date  . Arthritis   . Depression   . Diabetes mellitus   . HIV (human immunodeficiency virus infection) (HCC)   . Hyperlipidemia   . Hypertension    History reviewed. No pertinent surgical history. Family History  Problem Relation Age of Onset  . Hypertension Mother    Social History  Substance Use Topics  . Smoking status: Never Smoker  . Smokeless tobacco: Never Used  . Alcohol use 0.6 - 1.2 oz/week    1 - 2 Cans of beer per week     Comment: occasional     Review of Systems  Constitutional: Negative.   HENT: Negative.   Eyes: Negative.   Respiratory: Negative.   Cardiovascular: Negative.   Gastrointestinal: Negative.   Endocrine: Negative.   Genitourinary: Negative.   Musculoskeletal: Negative.   Allergic/Immunologic: Negative.   Neurological: Positive for headaches.  Hematological: Negative.   Psychiatric/Behavioral: Negative.     Allergies  Lisinopril  Home Medications   Prior to Admission medications   Medication Sig Start Date End Date Taking? Authorizing Provider  ACCU-CHEK SOFTCLIX LANCETS lancets Use as instructed for 3 times daily testing of blood sugar 12/07/15   Jaclyn ShaggyEnobong Amao, MD  amLODipine (NORVASC) 5 MG tablet TAKE 1 TABLET(5 MG) BY MOUTH DAILY 06/20/16   Jaclyn ShaggyEnobong Amao, MD   atorvastatin (LIPITOR) 20 MG tablet Take 1 tablet (20 mg total) by mouth daily. 06/20/16   Jaclyn ShaggyEnobong Amao, MD  cyclobenzaprine (FLEXERIL) 10 MG tablet Take 1 tablet (10 mg total) by mouth at bedtime. 07/12/16   Deatra CanterWilliam J Nalaya Wojdyla, FNP  elvitegravir-cobicistat-emtricitabine-tenofovir (GENVOYA) 150-150-200-10 MG TABS tablet Take 1 tablet by mouth daily with breakfast. 05/06/16   Cliffton AstersJohn Campbell, MD  ezetimibe (ZETIA) 10 MG tablet Take 1 tablet (10 mg total) by mouth daily. 02/12/16   Cliffton AstersJohn Campbell, MD  fenofibrate (TRICOR) 48 MG tablet TAKE 1 TABLET(48 MG) BY MOUTH DAILY 06/20/16   Jaclyn ShaggyEnobong Amao, MD  fluticasone (FLONASE) 50 MCG/ACT nasal spray Place 2 sprays into both nostrils daily. 09/02/15   Cliffton AstersJohn Campbell, MD  glipiZIDE (GLUCOTROL) 10 MG tablet TAKE 1 TABLET(10 MG) BY MOUTH TWICE DAILY BEFORE A MEAL 06/20/16   Jaclyn ShaggyEnobong Amao, MD  glucose blood (ACCU-CHEK AVIVA) test strip Use as instructed for 3 times daily testing of blood sugar 12/07/15   Jaclyn ShaggyEnobong Amao, MD  Insulin Glargine (LANTUS SOLOSTAR) 100 UNIT/ML Solostar Pen Inject 10 Units into the skin daily at 10 pm. 06/20/16   Jaclyn ShaggyEnobong Amao, MD  Insulin Pen Needle 31G X 5 MM MISC 1 each by Does not apply route at bedtime. 02/24/16   Jaclyn ShaggyEnobong Amao, MD  ipratropium (ATROVENT) 0.06 % nasal spray Place 2 sprays into both nostrils 4 (four) times daily. 07/02/16   Dorena BodoLawrence Kennard, NP  Lancet Devices [facility](ACCU-CHEK SOFTCLIX) lancets Use as instructed for 3 times  daily testing of blood sugar. 12/07/15   Jaclyn Shaggy, MD  methylPREDNISolone (MEDROL DOSEPAK) 4 MG TBPK tablet Take 6-5-4-3-2-1 po qd 07/12/16   Deatra Canter, FNP  ranitidine (ZANTAC) 150 MG tablet Take 1 tablet (150 mg total) by mouth 2 (two) times daily. Patient not taking: Reported on 06/20/2016 07/08/15   Linna Hoff, MD   Meds Ordered and Administered this Visit   Medications  ketorolac (TORADOL) injection 60 mg (not administered)    BP 131/84 (BP Location: Left Arm)   Pulse (!) 8   Temp 98.3 F (36.8 C)  (Oral)   Resp (!) 6   Ht 5\' 11"  (1.803 m)   Wt 205 lb (93 kg)   SpO2 99%   BMI 28.59 kg/m  No data found.   Physical Exam  Constitutional: He is oriented to person, place, and time. He appears well-developed and well-nourished.  HENT:  Head: Normocephalic.  Right Ear: External ear normal.  Left Ear: External ear normal.  Mouth/Throat: Oropharynx is clear and moist.  Eyes: Conjunctivae and EOM are normal. Pupils are equal, round, and reactive to light.  Neck: Normal range of motion. Neck supple.  Cardiovascular: Normal rate, regular rhythm and normal heart sounds.   Pulmonary/Chest: Effort normal and breath sounds normal.  Neurological: He is alert and oriented to person, place, and time.  Nursing note and vitals reviewed.   Urgent Care Course     Procedures (including critical care time)  Labs Review Labs Reviewed - No data to display  Imaging Review No results found.   Visual Acuity Review  Right Eye Distance:   Left Eye Distance:   Bilateral Distance:    Right Eye Near:   Left Eye Near:    Bilateral Near:         MDM   1. Tension headache    Medrol dose pack as directed #21 4mg  Flexeril 10mg  one po qhs prn #10 Toradol 60mg  IM now      Deatra Canter, FNP 07/12/16 2054

## 2016-07-18 ENCOUNTER — Encounter: Payer: Self-pay | Admitting: Internal Medicine

## 2016-07-19 ENCOUNTER — Telehealth: Payer: Self-pay | Admitting: Family Medicine

## 2016-07-19 NOTE — Telephone Encounter (Signed)
Patient called the office to speak with nurse regarding his sugar levels and medication that he was prescribed during his visit to the Urgent Care. Please follow up.  Thank you.

## 2016-07-20 NOTE — Telephone Encounter (Signed)
Writer spoke with patient this evening and patient was confused about when to take the lantus stating that he has always taken it during the day and after speaking with Dr. Venetia NightAmao earlier he took the 14 units of lantus after getting off the telephone with her. Writer repeated Dr. Jen MowAmao's instructions to take the lantus in the evening 14 units times 5 days.  Patient will start taking it tomorrow evening.  His BS this afternoon was 293.

## 2016-07-20 NOTE — Telephone Encounter (Signed)
I informed the patient to Increase Lantus to 14 units daily at bedtime for the next 5 days then resume regular dose of 10 units.

## 2016-07-20 NOTE — Telephone Encounter (Signed)
Patient called [facility] nurse triage for advice regarding his blood sugars. He finished a medrol dosepack on Sunday, but his blood sugars are still elevated. They have been as high as 500, but have dropped into the 400's-300's.  Last night he was 316, this morning he was 298.   He called [facility], is still waiting for a call back to see if he should add additional units to his regular dose of insulin to bring his sugars to the normal level.  Patient reports that he has improved his glucose control - he normally stays 90-115.   RN advised patient that I would send this message to his PCP for advice. Patient's call back number is [phone]

## 2016-09-13 ENCOUNTER — Ambulatory Visit: Payer: Medicare Other | Attending: Family Medicine | Admitting: Family Medicine

## 2016-09-13 ENCOUNTER — Encounter: Payer: Self-pay | Admitting: Family Medicine

## 2016-09-13 VITALS — BP 105/66 | HR 79 | Temp 98.0°F | Resp 18 | Ht 71.0 in | Wt 206.0 lb

## 2016-09-13 DIAGNOSIS — F329 Major depressive disorder, single episode, unspecified: Secondary | ICD-10-CM | POA: Diagnosis not present

## 2016-09-13 DIAGNOSIS — E7800 Pure hypercholesterolemia, unspecified: Secondary | ICD-10-CM | POA: Diagnosis not present

## 2016-09-13 DIAGNOSIS — B20 Human immunodeficiency virus [HIV] disease: Secondary | ICD-10-CM | POA: Diagnosis not present

## 2016-09-13 DIAGNOSIS — I10 Essential (primary) hypertension: Secondary | ICD-10-CM | POA: Diagnosis not present

## 2016-09-13 DIAGNOSIS — Z794 Long term (current) use of insulin: Secondary | ICD-10-CM | POA: Diagnosis not present

## 2016-09-13 DIAGNOSIS — E785 Hyperlipidemia, unspecified: Secondary | ICD-10-CM | POA: Insufficient documentation

## 2016-09-13 DIAGNOSIS — Z79899 Other long term (current) drug therapy: Secondary | ICD-10-CM | POA: Insufficient documentation

## 2016-09-13 DIAGNOSIS — Z0001 Encounter for general adult medical examination with abnormal findings: Secondary | ICD-10-CM | POA: Diagnosis not present

## 2016-09-13 DIAGNOSIS — E119 Type 2 diabetes mellitus without complications: Secondary | ICD-10-CM | POA: Diagnosis not present

## 2016-09-13 LAB — GLUCOSE, POCT (MANUAL RESULT ENTRY): POC Glucose: 173 mg/dl — AB (ref 70–99)

## 2016-09-13 LAB — POCT GLYCOSYLATED HEMOGLOBIN (HGB A1C): Hemoglobin A1C: 9

## 2016-09-13 MED ORDER — AMLODIPINE BESYLATE 5 MG PO TABS: ORAL_TABLET | ORAL | 1 refills | Status: DC

## 2016-09-13 MED ORDER — ATORVASTATIN CALCIUM 20 MG PO TABS: 20.0000 mg | ORAL_TABLET | Freq: Every day | ORAL | 1 refills | Status: DC

## 2016-09-13 MED ORDER — FLUTICASONE PROPIONATE 50 MCG/ACT NA SUSP: 2.0000 | Freq: Every day | NASAL | 3 refills | Status: DC

## 2016-09-13 MED ORDER — GLIPIZIDE 10 MG PO TABS: ORAL_TABLET | ORAL | 1 refills | Status: DC

## 2016-09-13 MED ORDER — INSULIN GLARGINE 100 UNIT/ML SOLOSTAR PEN: 15.0000 [IU] | PEN_INJECTOR | Freq: Every day | SUBCUTANEOUS | 3 refills | Status: DC

## 2016-09-13 MED ORDER — INSULIN GLARGINE 100 UNIT/ML SOLOSTAR PEN: 10.0000 [IU] | PEN_INJECTOR | Freq: Every day | SUBCUTANEOUS | 3 refills | Status: DC

## 2016-09-13 NOTE — Progress Notes (Signed)
Subjective:  Patient ID: Benjamin Hunter, male    DOB: [birth year]  Age: 56 y.o. MRN: [id number]  CC: Diabetes   HPI Benjamin Hunter is a 56 year old male with a history of hypertension, type 2 diabetes mellitus (A1c 9.0 up from 6.6), HIV who comes into the clinic for a follow-up visit.  He has been compliant with his medications but endorses ingestion of  "dark chocolate because this is good for his heart". Denies hypoglycemia, numbness in extremities.  He has been compliant with his antihypertensive and his statin and low-sodium diet. Does not exercise but is planning on joining a gym.  He is closely followed by infectious disease and is currently on antiretrovirals therapy.  Past Medical History:  Diagnosis Date  . Arthritis   . Depression   . Diabetes mellitus   . HIV (human immunodeficiency virus infection) (HCC)   . Hyperlipidemia   . Hypertension     No past surgical history on file.  Allergies  Allergen Reactions  . Lisinopril Rash    Palmar rash      Outpatient Medications Prior to Visit  Medication Sig Dispense Refill  . ACCU-CHEK SOFTCLIX LANCETS lancets Use as instructed for 3 times daily testing of blood sugar 100 each 12  . cyclobenzaprine (FLEXERIL) 10 MG tablet Take 1 tablet (10 mg total) by mouth at bedtime. 10 tablet 0  . elvitegravir-cobicistat-emtricitabine-tenofovir (GENVOYA) 150-150-200-10 MG TABS tablet Take 1 tablet by mouth daily with breakfast. 30 tablet 11  . ezetimibe (ZETIA) 10 MG tablet Take 1 tablet (10 mg total) by mouth daily. 30 tablet 11  . fenofibrate (TRICOR) 48 MG tablet TAKE 1 TABLET(48 MG) BY MOUTH DAILY 90 tablet 1  . glucose blood (ACCU-CHEK AVIVA) test strip Use as instructed for 3 times daily testing of blood sugar 100 each 12  . Insulin Pen Needle 31G X 5 MM MISC 1 each by Does not apply route at bedtime. 30 each 5  . ipratropium (ATROVENT) 0.06 % nasal spray Place 2 sprays into both nostrils 4 (four) times daily. 15 mL 12    . Lancet Devices (ACCU-CHEK SOFTCLIX) lancets Use as instructed for 3 times daily testing of blood sugar. 1 each 0  . ranitidine (ZANTAC) 150 MG tablet Take 1 tablet (150 mg total) by mouth 2 (two) times daily. (Patient not taking: Reported on 06/20/2016) 60 tablet 0  . amLODipine (NORVASC) 5 MG tablet TAKE 1 TABLET(5 MG) BY MOUTH DAILY 90 tablet 1  . atorvastatin (LIPITOR) 20 MG tablet Take 1 tablet (20 mg total) by mouth daily. 90 tablet 1  . fluticasone (FLONASE) 50 MCG/ACT nasal spray Place 2 sprays into both nostrils daily. 16 g 11  . glipiZIDE (GLUCOTROL) 10 MG tablet TAKE 1 TABLET(10 MG) BY MOUTH TWICE DAILY BEFORE A MEAL 180 tablet 1  . Insulin Glargine (LANTUS SOLOSTAR) 100 UNIT/ML Solostar Pen Inject 10 Units into the skin daily at 10 pm. 5 pen 3  . methylPREDNISolone (MEDROL DOSEPAK) 4 MG TBPK tablet Take 6-5-4-3-2-1 po qd 21 tablet 0   No facility-administered medications prior to visit.     ROS Review of Systems  Constitutional: Negative for activity change and appetite change.  HENT: Negative for sinus pressure and sore throat.   Eyes: Negative for visual disturbance.  Respiratory: Negative for cough, chest tightness and shortness of breath.   Cardiovascular: Negative for chest pain and leg swelling.  Gastrointestinal: Negative for abdominal distention, abdominal pain, constipation and diarrhea.  Endocrine: Negative.   Genitourinary: Negative  for dysuria.  Musculoskeletal: Negative for joint swelling and myalgias.  Skin: Negative for rash.  Allergic/Immunologic: Negative.   Neurological: Negative for weakness, light-headedness and numbness.  Psychiatric/Behavioral: Negative for dysphoric mood and suicidal ideas.    Objective:  BP 105/66 (BP Location: Right Arm, Patient Position: Sitting, Cuff Size: Normal)   Pulse 79   Temp 98 F (36.7 C) (Oral)   Resp 18   Ht  (1.803 m)   Wt 206 lb (93.4 kg)   SpO2 100%   BMI 28.73 kg/m   BP/Weight 09/13/2016 07/12/2016  07/02/2016  Systolic BP 105 131 134  Diastolic BP 66 84 80  Wt. (Lbs) 206 205 -  BMI 28.73 28.59 -      Physical Exam  Constitutional: He is oriented to person, place, and time. He appears well-developed and well-nourished.  Cardiovascular: Normal rate, normal heart sounds and intact distal pulses.   No murmur heard. Pulmonary/Chest: Effort normal and breath sounds normal. He has no wheezes. He has no rales. He exhibits no tenderness.  Abdominal: Soft. Bowel sounds are normal. He exhibits no distension and no mass. There is no tenderness.  Musculoskeletal: Normal range of motion.  Neurological: He is alert and oriented to person, place, and time.  Skin: Skin is warm and dry.  Psychiatric: He has a normal mood and affect.     CMP Latest Ref Rng & Units 04/28/2016 11/25/2015 08/10/2015  Glucose 65 - 99 mg/dL 66 161(W) 960(A)  BUN 7 - 25 mg/dL Creatinine 0.70 - 1.33 mg/dL 5.40 9.81 1.91  Sodium 135 - 146 mmol/L 139 128(L) 138  Potassium 3.5 - 5.3 mmol/L 3.9 4.3 4.2  Chloride 98 - 110 mmol/L 108 98 106  CO2 20 - 31 mmol/L Calcium 8.6 - 10.3 mg/dL 9.2 9.3 9.4  Total Protein 6.1 - 8.1 g/dL 7.4 7.6 7.4  Total Bilirubin 0.2 - 1.2 mg/dL 0.5 0.6 0.3  Alkaline Phos 40 - 115 U/L 55 85 63  AST 10 - 35 U/L ALT 9 - 46 U/L Lipid Panel     Component Value Date/Time   CHOL 169 04/28/2016 1050   TRIG 107 04/28/2016 1050   HDL 43 04/28/2016 1050   CHOLHDL 3.9 04/28/2016 1050   VLDL 21 04/28/2016 1050   LDLCALC 105 (H) 04/28/2016 1050    Lab Results  Component Value Date   HGBA1C 9.0 09/13/2016    Assessment & Plan:   1. Type 2 diabetes mellitus without complication, with long-term current use of insulin (HCC) Uncontrolled with A1c of 9.0 due to dietary indiscretion Increased dose of Lantus Stressed importance of complying with diabetic diet - POCT A1C - Glucose (CBG) - Microalbumin/Creatinine Ratio, Urine - glipiZIDE (GLUCOTROL) 10 MG  tablet; TAKE 1 TABLET(10 MG) BY MOUTH TWICE DAILY BEFORE A MEAL  Dispense: 180 tablet; Refill: 1 - Insulin Glargine (LANTUS SOLOSTAR) 100 UNIT/ML Solostar Pen; Inject 15 Units into the skin daily at 10 pm.  Dispense: 5 pen; Refill: 3  2. Pure hypercholesterolemia Continue statin, fibrate and Zetia - atorvastatin (LIPITOR) 20 MG tablet; Take 1 tablet (20 mg total) by mouth daily.  Dispense: 90 tablet; Refill: 1  3. Essential hypertension, benign Controlled - amLODipine (NORVASC) 5 MG tablet; TAKE 1 TABLET(5 MG) BY MOUTH DAILY  Dispense: 90 tablet; Refill: 1  4. Human immunodeficiency virus (HIV) disease (HCC) Continue antiretroviral therapy Keep appointment with infectious disease.  Meds ordered this encounter  Medications  . DISCONTD: Insulin Glargine (LANTUS SOLOSTAR) 100 UNIT/ML Solostar Pen    Sig: Inject 10 Units into the skin daily at 10 pm.    Dispense:  5 pen    Refill:  3    Discontinue previous dose  . glipiZIDE (GLUCOTROL) 10 MG tablet    Sig: TAKE 1 TABLET(10 MG) BY MOUTH TWICE DAILY BEFORE A MEAL    Dispense:  180 tablet    Refill:  1    **Patient requests 90 days supply**  . fluticasone (FLONASE) 50 MCG/ACT nasal spray    Sig: Place 2 sprays into both nostrils daily.    Dispense:  16 g    Refill:  3  . atorvastatin (LIPITOR) 20 MG tablet    Sig: Take 1 tablet (20 mg total) by mouth daily.    Dispense:  90 tablet    Refill:  1  . amLODipine (NORVASC) 5 MG tablet    Sig: TAKE 1 TABLET(5 MG) BY MOUTH DAILY    Dispense:  90 tablet    Refill:  1    **Patient requests 90 days supply**  . Insulin Glargine (LANTUS SOLOSTAR) 100 UNIT/ML Solostar Pen    Sig: Inject 15 Units into the skin daily at 10 pm.    Dispense:  5 pen    Refill:  3    Discontinue 10 units    Follow-up:  3months  Jaclyn Shaggy MD

## 2016-09-13 NOTE — Progress Notes (Signed)
Patient is here for DM FU  Patient denies pain at this time.  Patient has taken medication today. Patient has eaten today.   

## 2016-09-14 LAB — MICROALBUMIN / CREATININE URINE RATIO
CREATININE, UR: 148.7 mg/dL
MICROALB/CREAT RATIO: 41 mg/g{creat} — AB (ref 0.0–30.0)
MICROALBUM., U, RANDOM: 61 ug/mL

## 2016-09-19 ENCOUNTER — Ambulatory Visit: Payer: Medicare Other | Admitting: Family Medicine

## 2016-10-27 ENCOUNTER — Other Ambulatory Visit: Payer: Medicare Other

## 2016-10-27 DIAGNOSIS — B20 Human immunodeficiency virus [HIV] disease: Secondary | ICD-10-CM | POA: Diagnosis not present

## 2016-10-27 LAB — COMPREHENSIVE METABOLIC PANEL
ALBUMIN: 4.2 g/dL (ref 3.6–5.1)
ALT: 15 U/L (ref 9–46)
AST: 16 U/L (ref 10–35)
Alkaline Phosphatase: 69 U/L (ref 40–115)
BUN: 14 mg/dL (ref 7–25)
CHLORIDE: 105 mmol/L (ref 98–110)
CO2: 25 mmol/L (ref 20–31)
CREATININE: 1.36 mg/dL — AB (ref 0.70–1.33)
Calcium: 9.5 mg/dL (ref 8.6–10.3)
Glucose, Bld: 55 mg/dL — ABNORMAL LOW (ref 65–99)
Potassium: 4.2 mmol/L (ref 3.5–5.3)
SODIUM: 139 mmol/L (ref 135–146)
Total Bilirubin: 0.4 mg/dL (ref 0.2–1.2)
Total Protein: 7.4 g/dL (ref 6.1–8.1)

## 2016-10-27 LAB — CBC
HCT: 39.8 % (ref 38.5–50.0)
HEMOGLOBIN: 12.7 g/dL — AB (ref 13.2–17.1)
MCH: 29.1 pg (ref 27.0–33.0)
MCHC: 31.9 g/dL — AB (ref 32.0–36.0)
MCV: 91.3 fL (ref 80.0–100.0)
MPV: 11.1 fL (ref 7.5–12.5)
Platelets: 199 10*3/uL (ref 140–400)
RBC: 4.36 MIL/uL (ref 4.20–5.80)
RDW: 13.4 % (ref 11.0–15.0)
WBC: 4 10*3/uL (ref 3.8–10.8)

## 2016-10-28 LAB — T-HELPER CELL (CD4) - (RCID CLINIC ONLY)
CD4 T CELL HELPER: 43 % (ref 33–55)
CD4 T Cell Abs: 720 /uL (ref 400–2700)

## 2016-10-28 LAB — RPR

## 2016-10-29 LAB — HIV-1 RNA QUANT-NO REFLEX-BLD
HIV 1 RNA QUANT: NOT DETECTED {copies}/mL
HIV-1 RNA Quant, Log: <1.3 Log copies/mL

## 2016-11-10 ENCOUNTER — Ambulatory Visit: Payer: Medicare Other | Admitting: Internal Medicine

## 2016-11-16 ENCOUNTER — Other Ambulatory Visit: Payer: Self-pay | Admitting: Family Medicine

## 2016-11-16 MED ORDER — ALCOHOL SWABS PADS: 1.0000 | MEDICATED_PAD | Freq: Three times a day (TID) | 11 refills | Status: DC

## 2016-12-13 ENCOUNTER — Ambulatory Visit: Payer: Medicare Other | Attending: Family Medicine | Admitting: Family Medicine

## 2016-12-13 ENCOUNTER — Ambulatory Visit: Payer: Medicare Other | Admitting: Internal Medicine

## 2016-12-13 ENCOUNTER — Ambulatory Visit (INDEPENDENT_AMBULATORY_CARE_PROVIDER_SITE_OTHER): Payer: Medicare Other | Admitting: Internal Medicine

## 2016-12-13 ENCOUNTER — Encounter: Payer: Self-pay | Admitting: Internal Medicine

## 2016-12-13 ENCOUNTER — Encounter: Payer: Self-pay | Admitting: Family Medicine

## 2016-12-13 VITALS — BP 111/69 | HR 73 | Temp 98.6°F | Resp 18 | Ht 71.0 in | Wt 203.0 lb

## 2016-12-13 DIAGNOSIS — E785 Hyperlipidemia, unspecified: Secondary | ICD-10-CM

## 2016-12-13 DIAGNOSIS — B20 Human immunodeficiency virus [HIV] disease: Secondary | ICD-10-CM

## 2016-12-13 DIAGNOSIS — Z794 Long term (current) use of insulin: Secondary | ICD-10-CM | POA: Diagnosis not present

## 2016-12-13 DIAGNOSIS — J302 Other seasonal allergic rhinitis: Secondary | ICD-10-CM | POA: Diagnosis not present

## 2016-12-13 DIAGNOSIS — E7800 Pure hypercholesterolemia, unspecified: Secondary | ICD-10-CM | POA: Insufficient documentation

## 2016-12-13 DIAGNOSIS — I10 Essential (primary) hypertension: Secondary | ICD-10-CM

## 2016-12-13 DIAGNOSIS — F329 Major depressive disorder, single episode, unspecified: Secondary | ICD-10-CM | POA: Insufficient documentation

## 2016-12-13 DIAGNOSIS — E119 Type 2 diabetes mellitus without complications: Secondary | ICD-10-CM | POA: Diagnosis not present

## 2016-12-13 DIAGNOSIS — I2510 Atherosclerotic heart disease of native coronary artery without angina pectoris: Secondary | ICD-10-CM | POA: Diagnosis not present

## 2016-12-13 DIAGNOSIS — M1990 Unspecified osteoarthritis, unspecified site: Secondary | ICD-10-CM | POA: Insufficient documentation

## 2016-12-13 DIAGNOSIS — E784 Other hyperlipidemia: Secondary | ICD-10-CM

## 2016-12-13 DIAGNOSIS — J3089 Other allergic rhinitis: Secondary | ICD-10-CM | POA: Diagnosis not present

## 2016-12-13 DIAGNOSIS — M130 Polyarthritis, unspecified: Secondary | ICD-10-CM

## 2016-12-13 DIAGNOSIS — Z0000 Encounter for general adult medical examination without abnormal findings: Secondary | ICD-10-CM

## 2016-12-13 DIAGNOSIS — E7849 Other hyperlipidemia: Secondary | ICD-10-CM

## 2016-12-13 LAB — GLUCOSE, POCT (MANUAL RESULT ENTRY): POC GLUCOSE: 89 mg/dL (ref 70–99)

## 2016-12-13 LAB — POCT GLYCOSYLATED HEMOGLOBIN (HGB A1C): Hemoglobin A1C: 5.4

## 2016-12-13 MED ORDER — NYSTATIN 100000 UNIT/ML MT SUSP: 5.0000 mL | Freq: Four times a day (QID) | OROMUCOSAL | 1 refills | Status: DC

## 2016-12-13 MED ORDER — INSULIN GLARGINE 100 UNIT/ML SOLOSTAR PEN: 10.0000 [IU] | PEN_INJECTOR | Freq: Every day | SUBCUTANEOUS | 3 refills | Status: DC

## 2016-12-13 NOTE — Progress Notes (Signed)
Patient Active Problem List   Diagnosis Date Noted  . Human immunodeficiency virus (HIV) disease (HCC) 07/17/2008    Priority: High  . 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 Seasonal allergies 09/20/2011    Priority: Medium  . Polyarthritis 09/06/2011    Priority: Medium  . Diabetes mellitus (HCC) 03/16/2009    Priority: Medium  . Hyperlipidemia 07/17/2008    Priority: Medium  . Hypertension 07/17/2008    Priority: Medium  . 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 Depression 01/14/2013  . Rash and nonspecific skin eruption 10/11/2012  . Recurrent low back pain 10/11/2012  . Left facial numbness 08/20/2012    Patient's Medications  New Prescriptions   No medications on file  Previous Medications   ACCU-CHEK SOFTCLIX LANCETS LANCETS    Use as instructed for 3 times daily testing of blood sugar   ALCOHOL SWABS PADS    1 each by Does not apply route 3 (three) times daily before meals.   AMLODIPINE (NORVASC) 5 MG TABLET    TAKE 1 TABLET(5 MG) BY MOUTH DAILY   ATORVASTATIN (LIPITOR) 20 MG TABLET    Take 1 tablet (20 mg total) by mouth daily.   CYCLOBENZAPRINE (FLEXERIL) 10 MG TABLET    Take 1 tablet (10 mg total) by mouth at bedtime.   ELVITEGRAVIR-COBICISTAT-EMTRICITABINE-TENOFOVIR (GENVOYA) 150-150-200-10 MG TABS TABLET    Take 1 tablet by mouth daily with breakfast.   EZETIMIBE (ZETIA) 10 MG TABLET    Take 1 tablet (10 mg total) by mouth daily.   FENOFIBRATE (TRICOR) 48 MG TABLET    TAKE 1 TABLET(48 MG) BY MOUTH DAILY   FLUTICASONE (FLONASE) 50 MCG/ACT NASAL SPRAY    Place 2 sprays into both nostrils daily.   GLIPIZIDE (GLUCOTROL) 10 MG TABLET    TAKE 1 TABLET(10 MG) BY MOUTH TWICE DAILY BEFORE A MEAL   GLUCOSE BLOOD (ACCU-CHEK AVIVA) TEST STRIP    Use as instructed for 3 times daily testing of blood sugar   INSULIN GLARGINE (LANTUS SOLOSTAR) 100 UNIT/ML SOLOSTAR PEN    Inject 10 Units into the skin daily at 10 pm.   INSULIN PEN NEEDLE 31G X 5 MM MISC    1 each  by Does not apply route at bedtime.   IPRATROPIUM (ATROVENT) 0.06 % NASAL SPRAY    Place 2 sprays into both nostrils 4 (four) times daily.   LANCET DEVICES (ACCU-CHEK SOFTCLIX) LANCETS    Use as instructed for 3 times daily testing of blood sugar.   NYSTATIN (MYCOSTATIN) 100000 UNIT/ML SUSPENSION    Take 5 mLs (500,000 Units total) by mouth 4 (four) times daily. Prn oral thrush  Modified Medications   No medications on file  Discontinued Medications   RANITIDINE (ZANTAC) 150 MG TABLET    Take 1 tablet (150 mg total) by mouth 2 (two) times daily.     Subjective: Benjamin Hunter is in for his routine HIV follow-up visit. He has had no problems obtaining, taking or tolerating his Genvoya. He denies missing any doses. He is also taking all of his other routine medications. He gets regular exercise walking every day. He is pleased to tell me that his hemoglobin A1c has come down significantly. It was 9 and April but today was down to 5.4. He tells me that he has been bothered by thrush recently with a white coating on his tongue. He was prescribed nystatin this morning when he saw his primary care provider.  Review of Systems: Review of Systems  Constitutional: Negative for chills, diaphoresis, fever, malaise/fatigue and weight loss.  HENT: Negative for sore throat.   Respiratory: Negative for cough, sputum production and shortness of breath.   Cardiovascular: Negative for chest pain.  Gastrointestinal: Negative for abdominal pain, diarrhea, heartburn, nausea and vomiting.  Genitourinary: Negative for dysuria and frequency.  Musculoskeletal: Negative for joint pain and myalgias.  Skin: Negative for rash.  Neurological: Negative for dizziness and headaches.  Psychiatric/Behavioral: Negative for depression and substance abuse. The patient is not nervous/anxious.     Past Medical History:  Diagnosis Date  . Arthritis   . Depression   . Diabetes mellitus   . HIV (human immunodeficiency virus  infection) (HCC)   . Hyperlipidemia   . Hypertension     Social History  Substance Use Topics  . Smoking status: Never Smoker  . Smokeless tobacco: Never Used  . Alcohol use 0.6 - 1.2 oz/week    1 - 2 Cans of beer per week     Comment: occasional     Family History  Problem Relation Age of Onset  . Hypertension Mother     Allergies  Allergen Reactions  . Lisinopril Rash    Palmar rash     Objective:  Vitals:   12/13/16 1121  BP: 122/77  Pulse: 73  Temp: 98 F (36.7 C)  TempSrc: Oral  Weight: 200 lb (90.7 kg)  Height: 5\' 11"  (1.803 m)   Body mass index is 27.89 kg/m.  Physical Exam  Constitutional: He is oriented to person, place, and time.  He is in good spirits.  HENT:  Mouth/Throat: No oropharyngeal exudate.  Eyes: Conjunctivae are normal.  Cardiovascular: Normal rate and regular rhythm.   No murmur heard. Pulmonary/Chest: Effort normal and breath sounds normal.  Abdominal: Soft. He exhibits no mass. There is no tenderness.  Musculoskeletal: Normal range of motion. He exhibits no edema or tenderness.  Neurological: He is alert and oriented to person, place, and time.  Skin: No rash noted.  He has a wart at the base of his right thumb.  Psychiatric: Mood and affect normal.    Lab Results Lab Results  Component Value Date   WBC 4.0 10/27/2016   HGB 12.7 (L) 10/27/2016   HCT 39.8 10/27/2016   MCV 91.3 10/27/2016   PLT 199 10/27/2016    Lab Results  Component Value Date   CREATININE 1.36 (H) 10/27/2016   BUN 14 10/27/2016   NA 139 10/27/2016   K 4.2 10/27/2016   CL 105 10/27/2016   CO2 25 10/27/2016    Lab Results  Component Value Date   ALT 15 10/27/2016   AST 16 10/27/2016   ALKPHOS 69 10/27/2016   BILITOT 0.4 10/27/2016    Lab Results  Component Value Date   CHOL 169 04/28/2016   HDL 43 04/28/2016   LDLCALC 105 (H) 04/28/2016   TRIG 107 04/28/2016   CHOLHDL 3.9 04/28/2016   Lab Results  Component Value Date   LABRPR NON  REAC 10/27/2016   HIV 1 RNA Quant (copies/mL)  Date Value  10/27/2016 <20 NOT DETECTED  04/28/2016 <20  11/25/2015 <20   CD4 T Cell Abs (/uL)  Date Value  10/27/2016 720  04/28/2016 660  11/25/2015 460     Problem List Items Addressed This Visit      High   Human immunodeficiency virus (HIV) disease (HCC)    His infection is under excellent, long-term control. His viral load  has been consistently undetectable and his CD4 count has been in the normal range for many years. He is not at risk for thrush or other opportunistic infections. I think the white coating on his tongue is simply a coated tongue and not thrush. He does not need nystatin. He will continue Genvoya and follow-up after lab work in one year.        Medium   Hyperlipidemia    His lipid profile has improved. His HDL and triglycerides are normal. His last LDL was only slightly elevated at 105.      Polyarthritis    His polyarthritis is in remission.           Benjamin Asters, MD [facility] [place] [facility] [phone] pager   [phone] cell 12/13/2016, 12:07 PM

## 2016-12-13 NOTE — Assessment & Plan Note (Signed)
His polyarthritis is in remission.

## 2016-12-13 NOTE — Assessment & Plan Note (Addendum)
His infection is under excellent, long-term control. His viral load has been consistently undetectable and his CD4 count has been in the normal range for many years. He is not at risk for thrush or other opportunistic infections. I think the white coating on his tongue is simply a coated tongue and not thrush. He does not need nystatin. He will continue Genvoya and follow-up after lab work in one year.

## 2016-12-13 NOTE — Assessment & Plan Note (Signed)
His lipid profile has improved. His HDL and triglycerides are normal. His last LDL was only slightly elevated at 105.

## 2016-12-13 NOTE — Patient Instructions (Signed)
Oral Thrush, Adult Oral thrush is an infection in your mouth and throat. It causes white patches on your tongue and in your mouth. Follow these instructions at home: Helping with soreness  To lessen your pain: ? Drink cold liquids, like water and iced tea. ? Eat frozen ice pops or frozen juices. ? Eat foods that are easy to swallow, like gelatin and ice cream. ? Drink from a straw if the patches in your mouth are painful. General instructions   Take or use over-the-counter and prescription medicines only as told by your doctor. Medicine for oral thrush may be something to swallow, or it may be something to put on the infected area.  Eat plain yogurt that has live cultures in it. Read the label to make sure.  If you wear dentures: ? Take out your dentures before you go to bed. ? Brush them well. ? Soak them in a denture cleaner.  Rinse your mouth with warm salt-water many times a day. To make the salt-water mixture, completely dissolve 1/2-1 teaspoon of salt in 1 cup of warm water. Contact a doctor if:  Your problems are getting worse.  Your problems do not get better in less than 7 days with treatment.  Your infection is spreading. This may show as white patches on the skin outside of your mouth.  You are nursing your baby and you have redness and pain in the nipples. This information is not intended to replace advice given to you by your health care provider. Make sure you discuss any questions you have with your health care provider. Document Released: 08/10/2009 Document Revised: 02/08/2016 Document Reviewed: 02/08/2016 Elsevier Interactive Patient Education  2017 [employer] Inc.  

## 2016-12-13 NOTE — Progress Notes (Signed)
CC: Follow up visit  HPI: He is a 56 year old male with a history of hypertension, type 2 diabetes mellitus (A1c 5.4 down from 9.0), HIV (on antiretrovirals therapy, CD4 count of 720, undetectable viral load) who comes into the clinic for a follow-up visit.  Compliant with Lantus but has noticed periods of hypoglycemia with sugars in the 50s otherwise his fasting sugars are in the 109 range Denies visual complaints, numbness in extremities.  He has been compliant with his antihypertensive and his statin and low-sodium diet. Does not exercise but is planning on joining a gym.  He is closely followed by infectious disease.  Patient has No headache, No chest pain, No abdominal pain - No Nausea, No Cough - SOB.    He complains of occasional oral thrush and has to scrape his tongue for symptomatic relief.  Allergies  Allergen Reactions  . Lisinopril Rash    Palmar rash    Past Medical History:  Diagnosis Date  . Arthritis   . Depression   . Diabetes mellitus   . HIV (human immunodeficiency virus infection) (HCC)   . Hyperlipidemia   . Hypertension    Current Outpatient Prescriptions on File Prior to Visit  Medication Sig Dispense Refill  . ACCU-CHEK SOFTCLIX LANCETS lancets Use as instructed for 3 times daily testing of blood sugar 100 each 12  . Alcohol Swabs PADS 1 each by Does not apply route 3 (three) times daily before meals. 90 each 11  . amLODipine (NORVASC) 5 MG tablet TAKE 1 TABLET(5 MG) BY MOUTH DAILY 90 tablet 1  . atorvastatin (LIPITOR) 20 MG tablet Take 1 tablet (20 mg total) by mouth daily. 90 tablet 1  . cyclobenzaprine (FLEXERIL) 10 MG tablet Take 1 tablet (10 mg total) by mouth at bedtime. 10 tablet 0  . elvitegravir-cobicistat-emtricitabine-tenofovir (GENVOYA) 150-150-200-10 MG TABS tablet Take 1 tablet by mouth daily with breakfast. 30 tablet 11  . ezetimibe (ZETIA) 10 MG tablet Take 1 tablet (10 mg total) by mouth daily. 30 tablet 11  . fenofibrate (TRICOR)  48 MG tablet TAKE 1 TABLET(48 MG) BY MOUTH DAILY 90 tablet 1  . fluticasone (FLONASE) 50 MCG/ACT nasal spray Place 2 sprays into both nostrils daily. 16 g 3  . glipiZIDE (GLUCOTROL) 10 MG tablet TAKE 1 TABLET(10 MG) BY MOUTH TWICE DAILY BEFORE A MEAL 180 tablet 1  . glucose blood (ACCU-CHEK AVIVA) test strip Use as instructed for 3 times daily testing of blood sugar 100 each 12  . Insulin Pen Needle 31G X 5 MM MISC 1 each by Does not apply route at bedtime. 30 each 5  . ipratropium (ATROVENT) 0.06 % nasal spray Place 2 sprays into both nostrils 4 (four) times daily. 15 mL 12  . Lancet Devices (ACCU-CHEK SOFTCLIX) lancets Use as instructed for 3 times daily testing of blood sugar. 1 each 0  . ranitidine (ZANTAC) 150 MG tablet Take 1 tablet (150 mg total) by mouth 2 (two) times daily. (Patient not taking: Reported on 06/20/2016) 60 tablet 0   No current facility-administered medications on file prior to visit.    Family History  Problem Relation Age of Onset  . Hypertension Mother    Social History   Social History  . Marital status: Single    Spouse name: N/A  . Number of children: N/A  . Years of education: N/A   Occupational History  . Not on file.   Social History Main Topics  . Smoking status: Never Smoker  . Smokeless  tobacco: Never Used  . Alcohol use 0.6 - 1.2 oz/week    1 - 2 Cans of beer per week     Comment: occasional   . Drug use: No  . Sexual activity: Not on file     Comment: declined condoms today   Other Topics Concern  . Not on file   Social History Narrative  . No narrative on file    Review of Systems: Constitutional: Negative for fever, chills, diaphoresis, activity change, appetite change and fatigue. HENT: Negative for ear pain, nosebleeds, congestion, facial swelling, rhinorrhea, neck pain, neck stiffness and ear discharge.  Eyes: Negative for pain, discharge, redness, itching and visual disturbance. Respiratory: Negative for cough, choking, chest  tightness, shortness of breath, wheezing and stridor.  Cardiovascular: Negative for chest pain, palpitations and leg swelling. Gastrointestinal: Negative for abdominal distention. Genitourinary: Negative for dysuria, urgency, frequency, hematuria, flank pain, decreased urine volume, difficulty urinating and dyspareunia.  Musculoskeletal: Negative for back pain, joint swelling, arthralgias and gait problem. Neurological: Negative for dizziness, tremors, seizures, syncope, facial asymmetry, speech difficulty, weakness, light-headedness, numbness and headaches.  Hematological: Negative for adenopathy. Does not bruise/bleed easily. Psychiatric/Behavioral: Negative for hallucinations, behavioral problems, confusion, dysphoric mood, decreased concentration and agitation.    Objective:   Vitals:   12/13/16 0827  BP: 111/69  Pulse: 73  Resp: 18  Temp: 98.6 F (37 C)    Physical Exam: Constitutional: Patient appears well-developed and well-nourished. No distress. HENT: Normocephalic, atraumatic, External right and left ear normal. Oropharynx is clear and moist.  Eyes: Conjunctivae and EOM are normal. PERRLA, no scleral icterus. Neck: Normal ROM. Neck supple. No JVD. No tracheal deviation. No thyromegaly. CVS: RRR, S1/S2 +, no murmurs, no gallops, no carotid bruit.  Pulmonary: Effort and breath sounds normal, no stridor, rhonchi, wheezes, rales.  Abdominal: Soft. BS +,  no distension, tenderness, rebound or guarding.  Musculoskeletal: Normal range of motion. No edema and no tenderness.  Lymphadenopathy: No lymphadenopathy noted, cervical, inguinal or axillary Neuro: Alert. Normal reflexes, muscle tone coordination. No cranial nerve deficit. Skin: Skin is warm and dry. No rash noted. Not diaphoretic. No erythema. No pallor. Psychiatric: Normal mood and affect. Behavior, judgment, thought content normal.  Lab Results  Component Value Date   WBC 4.0 10/27/2016   HGB 12.7 (L) 10/27/2016    HCT 39.8 10/27/2016   MCV 91.3 10/27/2016   PLT 199 10/27/2016   Lab Results  Component Value Date   CREATININE 1.36 (H) 10/27/2016   BUN 14 10/27/2016   NA 139 10/27/2016   K 4.2 10/27/2016   CL 105 10/27/2016   CO2 25 10/27/2016    Lab Results  Component Value Date   HGBA1C 5.4 12/13/2016   Lipid Panel     Component Value Date/Time   CHOL 169 04/28/2016 1050   TRIG 107 04/28/2016 1050   HDL 43 04/28/2016 1050   CHOLHDL 3.9 04/28/2016 1050   VLDL 21 04/28/2016 1050   LDLCALC 105 (H) 04/28/2016 1050   The 10-year ASCVD risk score Denman George DC Jr., et al., 2013) is: 19.7%   Values used to calculate the score:     Age: 56 years     Sex: Male     Is Non-Hispanic African American: Yes     Diabetic: Yes     Tobacco smoker: No     Systolic Blood Pressure: 122 mmHg     Is BP treated: Yes     HDL Cholesterol: 43 mg/dL  Total Cholesterol: 169 mg/dL      Assessment and plan:  1. Type 2 diabetes mellitus without complication, with long-term current use of insulin (HCC) Controlled with A1c of 5.4 complicated by episodes of hypoglycemia Decrease Lantus dose to 10 units due to hypoglycemia He has been educated on self adjusting Lantus dose - decrease by 2 units in the event of hypoglycemia  Stressed importance of complying with diabetic diet - POCT A1C - Glucose (CBG) - Microalbumin/Creatinine Ratio, Urine - glipiZIDE (GLUCOTROL) 10 MG tablet; TAKE 1 TABLET(10 MG) BY MOUTH TWICE DAILY BEFORE A MEAL  Dispense: 180 tablet; Refill: 1 - Insulin Glargine (LANTUS SOLOSTAR) 100 UNIT/ML Solostar Pen; Inject 10 Units into the skin daily at 10 pm.  Dispense: 5 pen; Refill: 3  2. Pure hypercholesterolemia Continue statin, fibrate and Zetia 10 yr ASCVD risk of 19.7%; will consider raising dose of statin at nextvisit - atorvastatin (LIPITOR) 20 MG tablet; Take 1 tablet (20 mg total) by mouth daily.  Dispense: 90 tablet; Refill: 1  3. Essential hypertension, benign Controlled -  amLODipine (NORVASC) 5 MG tablet; TAKE 1 TABLET(5 MG) BY MOUTH DAILY  Dispense: 90 tablet; Refill: 1  4. Human immunodeficiency virus (HIV) disease (HCC) Continue antiretroviral therapy Intermittent nystatin as needed for oral thrush Keep appointment with infectious disease.  5. Healthcare maintenance Declines colonoscopy, ophthalmology referral due to finances  6. Seasonal allergies    Continue Heloise Purpura, MD. [facility] [phone] 12/13/2016, 8:59 AM

## 2016-12-13 NOTE — Assessment & Plan Note (Signed)
His diabetes has come under much better control.

## 2016-12-15 ENCOUNTER — Ambulatory Visit: Payer: Medicare Other | Admitting: Internal Medicine

## 2016-12-24 ENCOUNTER — Other Ambulatory Visit: Payer: Self-pay | Admitting: Family Medicine

## 2016-12-24 ENCOUNTER — Other Ambulatory Visit: Payer: Self-pay | Admitting: Internal Medicine

## 2016-12-24 DIAGNOSIS — E7800 Pure hypercholesterolemia, unspecified: Secondary | ICD-10-CM

## 2016-12-24 DIAGNOSIS — E785 Hyperlipidemia, unspecified: Secondary | ICD-10-CM

## 2016-12-30 ENCOUNTER — Other Ambulatory Visit: Payer: Self-pay | Admitting: Family Medicine

## 2016-12-30 DIAGNOSIS — I10 Essential (primary) hypertension: Secondary | ICD-10-CM

## 2016-12-30 DIAGNOSIS — B20 Human immunodeficiency virus [HIV] disease: Secondary | ICD-10-CM

## 2016-12-30 DIAGNOSIS — E119 Type 2 diabetes mellitus without complications: Secondary | ICD-10-CM

## 2016-12-30 DIAGNOSIS — E785 Hyperlipidemia, unspecified: Secondary | ICD-10-CM

## 2017-01-06 ENCOUNTER — Ambulatory Visit (HOSPITAL_COMMUNITY)
Admission: EM | Admit: 2017-01-06 | Discharge: 2017-01-06 | Disposition: A | Discharge status: Home / Self Care | Payer: Medicare Other | Attending: Family Medicine | Admitting: Family Medicine

## 2017-01-06 ENCOUNTER — Encounter (HOSPITAL_COMMUNITY): Payer: Self-pay | Admitting: Emergency Medicine

## 2017-01-06 DIAGNOSIS — G44209 Tension-type headache, unspecified, not intractable: Secondary | ICD-10-CM

## 2017-01-06 DIAGNOSIS — M542 Cervicalgia: Secondary | ICD-10-CM

## 2017-01-06 MED ORDER — CYCLOBENZAPRINE HCL 10 MG PO TABS: 10.0000 mg | ORAL_TABLET | Freq: Every day | ORAL | 0 refills | Status: DC

## 2017-01-06 MED ORDER — NAPROXEN 500 MG PO TABS: 500.0000 mg | ORAL_TABLET | Freq: Two times a day (BID) | ORAL | 0 refills | Status: DC

## 2017-01-06 NOTE — ED Provider Notes (Signed)
  [facility]   [id number] 01/06/17 Arrival Time: 1001  ASSESSMENT & PLAN:  1. Tension-type headache, not intractable, unspecified chronicity pattern   2. Cervicalgia     Meds ordered this encounter  Medications  . naproxen (NAPROSYN) 500 MG tablet    Sig: Take 1 tablet (500 mg total) by mouth 2 (two) times daily with a meal.    Dispense:  14 tablet    Refill:  0    Order Specific Question:   Supervising Provider    Answer:   Eustace MooreMURRAY, LAURA W [[id number]]  . DISCONTD: cyclobenzaprine (FLEXERIL) 10 MG tablet    Sig: Take 1 tablet (10 mg total) by mouth at bedtime.    Dispense:  10 tablet    Refill:  0    Order Specific Question:   Supervising Provider    Answer:   Eustace MooreMURRAY, LAURA W [[id number]]  . cyclobenzaprine (FLEXERIL) 10 MG tablet    Sig: Take 1 tablet (10 mg total) by mouth at bedtime.    Dispense:  10 tablet    Refill:  0    Order Specific Question:   Supervising Provider    Answer:   Eustace MooreMURRAY, LAURA W [[id number]]   Discussed taking tylenol otc Reviewed expectations re: course of current medical issues. Questions answered. Outlined signs and symptoms indicating need for more acute intervention. Patient verbalized understanding. After Visit Summary given.   SUBJECTIVE:  Benjamin Hunter is a 56 y.o. male who presents with complaint of   ROS: As per HPI.   OBJECTIVE:  Vitals:   01/06/17 1011  BP: 124/78  Pulse: 86  Resp: 16  Temp: 98.7 F (37.1 C)  TempSrc: Oral  SpO2: 97%     General appearance: alert; no distress HEENT: normocephalic; atraumatic; conjunctivae normal; TMs normal; nasal mucosa normal; oral mucosa normal Neck: supple Lungs: clear to auscultation bilaterally Heart: regular rate and rhythm Back: no CVA tenderness MS - TTP right cervical paraspinous muscles Extremities: no cyanosis or edema; symmetrical with no gross deformities Skin: warm and dry Neurologic: normal symmetric reflexes; normal gait Psychological:  alert and cooperative;  normal mood and affect  Results for orders placed or performed in visit on 12/13/16  POCT A1C  Result Value Ref Range   Hemoglobin A1C 5.4   Glucose (CBG)  Result Value Ref Range   POC Glucose 89 70 - 99 mg/dl    Labs Reviewed - No data to display  No results found.  Allergies  Allergen Reactions  . Lisinopril Rash    Palmar rash     PMHx, SurgHx, SocialHx, Medications, and Allergies were reviewed in the Visit Navigator and updated as appropriate.      Benjamin Hunter, Benjamin J, FNP 01/06/17 1048

## 2017-01-06 NOTE — Discharge Instructions (Signed)
Take tylenol otc as directed.  You have a headache related to neck strain and do not see infection or pneumonia

## 2017-01-06 NOTE — ED Triage Notes (Addendum)
[facility] a [facility] and fever last night.

## 2017-01-09 ENCOUNTER — Ambulatory Visit (HOSPITAL_COMMUNITY)
Admission: EM | Admit: 2017-01-09 | Discharge: 2017-01-09 | Disposition: A | Discharge status: Home / Self Care | Payer: Medicare Other | Attending: Family Medicine | Admitting: Family Medicine

## 2017-01-09 ENCOUNTER — Encounter (HOSPITAL_COMMUNITY): Payer: Self-pay | Admitting: Emergency Medicine

## 2017-01-09 DIAGNOSIS — J029 Acute pharyngitis, unspecified: Secondary | ICD-10-CM | POA: Diagnosis not present

## 2017-01-09 NOTE — ED Provider Notes (Signed)
  [facility]   [id number] 01/09/17 Arrival Time: 1046  ASSESSMENT & PLAN:  1. Sore throat   2. Pharyngitis, unspecified etiology     No orders of the defined types were placed in this encounter.   Reviewed expectations re: course of current medical issues. Questions answered. Outlined signs and symptoms indicating need for more acute intervention. Patient verbalized understanding. After Visit Summary given.   SUBJECTIVE:  Carilyn GoodpastureKenneth Spradlin is a 56 y.o. male who presents with complaint of   ROS: As per HPI.   OBJECTIVE:  Vitals:   01/09/17 1129  BP: 115/77  Pulse: 70  Resp: 18  Temp: 98.4 F (36.9 C)  TempSrc: Oral  SpO2: 97%     General appearance: alert; no distress HEENT: normocephalic; atraumatic; conjunctivae normal; TMs normal; nasal mucosa normal; oral mucosa normal. Tonsils 2 plus and erythematous Neck: supple Lungs: clear to auscultation bilaterally Heart: regular rate and rhythm Neurologic: normal symmetric reflexes; normal gait Psychological:  alert and cooperative; normal mood and affect  Results for orders placed or performed in visit on 12/13/16  POCT A1C  Result Value Ref Range   Hemoglobin A1C 5.4   Glucose (CBG)  Result Value Ref Range   POC Glucose 89 70 - 99 mg/dl    Labs Reviewed - No data to display  No results found.  Allergies  Allergen Reactions  . Lisinopril Rash    Palmar rash     PMHx, SurgHx, SocialHx, Medications, and Allergies were reviewed in the Visit Navigator and updated as appropriate.      Deatra CanterOxford, Kimeka Badour J, [place]FNP 01/09/17 [phone]

## 2017-01-09 NOTE — ED Triage Notes (Signed)
Patient has a sore throat that started last night.  Patient seen 8/10 for headache and fever.  Patient concerned related to medication he received at that time are maybe air conditioner per patient

## 2017-01-09 NOTE — Discharge Instructions (Signed)
USE atrovent or ipratropium nasal spray along with taking warm salt water gargles as necessary for throat discomfort.

## 2017-01-12 ENCOUNTER — Ambulatory Visit (HOSPITAL_COMMUNITY)
Admission: EM | Admit: 2017-01-12 | Discharge: 2017-01-12 | Disposition: A | Discharge status: Home / Self Care | Payer: Medicare Other | Attending: Family Medicine | Admitting: Family Medicine

## 2017-01-12 ENCOUNTER — Ambulatory Visit (INDEPENDENT_AMBULATORY_CARE_PROVIDER_SITE_OTHER): Payer: Medicare Other

## 2017-01-12 ENCOUNTER — Encounter (HOSPITAL_COMMUNITY): Payer: Self-pay | Admitting: Nurse Practitioner

## 2017-01-12 DIAGNOSIS — J209 Acute bronchitis, unspecified: Secondary | ICD-10-CM | POA: Diagnosis not present

## 2017-01-12 DIAGNOSIS — J029 Acute pharyngitis, unspecified: Secondary | ICD-10-CM | POA: Insufficient documentation

## 2017-01-12 DIAGNOSIS — Z79899 Other long term (current) drug therapy: Secondary | ICD-10-CM | POA: Diagnosis not present

## 2017-01-12 DIAGNOSIS — Z888 Allergy status to other drugs, medicaments and biological substances status: Secondary | ICD-10-CM | POA: Insufficient documentation

## 2017-01-12 DIAGNOSIS — R05 Cough: Secondary | ICD-10-CM | POA: Diagnosis not present

## 2017-01-12 LAB — POCT RAPID STREP A: Streptococcus, Group A Screen (Direct): NEGATIVE

## 2017-01-12 MED ORDER — BENZONATATE 100 MG PO CAPS: 100.0000 mg | ORAL_CAPSULE | Freq: Three times a day (TID) | ORAL | 0 refills | Status: DC

## 2017-01-12 MED ORDER — AZITHROMYCIN 250 MG PO TABS
250.0000 mg | ORAL_TABLET | Freq: Every day | ORAL | 0 refills | Status: DC
Start: 2017-01-12 — End: 2017-03-13

## 2017-01-12 MED ORDER — PREDNISONE 50 MG PO TABS: ORAL_TABLET | ORAL | 0 refills | Status: DC

## 2017-01-12 NOTE — Discharge Instructions (Signed)
I am treating you for bronchitis. I have prescribed Azithromycin. Take 2 tablets today, then 1 tablet daily till finished. I have also prescribed a steroid called prednisone. Take one tablet daily with food. For cough, I have prescribed a medication called Tessalon. Take 1 tablet every 8 hours as needed for your cough. Should your symptoms fail to improve or worsen, follow up with your primary care provider, or return to clinic.  °

## 2017-01-12 NOTE — ED Provider Notes (Signed)
[facility]   [id number] 01/12/17 Arrival Time: 1215   SUBJECTIVE:  Benjamin Hunter is a 56 y.o. male who presents to the urgent care with complaint of sore throat, and cough, is been ongoing for 7 days. He was originally seen here on 01/06/2017, diagnosed with viral pharyngitis. Symptoms have been unrelieved with saltwater mouth washes, and over-the-counter Mucinex. States cough has worsened, is productive, worse at night, and has had wheezing at night as well. No known history of asthma or other lung issues. He does have a past history of HIV, who reports his CD4 counts have been normal, and his infection is been kept under control.  ROS: As per HPI, remainder of ROS negative.   OBJECTIVE:  Vitals:   01/12/17 1238 01/12/17 1239  BP: 108/66   Pulse: 82   Resp: 16   Temp: 98.4 F (36.9 C)   TempSrc: Oral   SpO2: 100%   Weight:  200 lb (90.7 kg)  Height:  5\' 11"  (1.803 m)     General appearance: alert; no distress HEENT: normocephalic; atraumatic; conjunctivae normal; TMs pearly gray, non-erythemic, nonbulging. Oropharynx, tonsils +2, erythema, and there is tonsillar exudate present. Turbinates patent with no epistaxis Neck: No cervical lymphadenopathy Lungs: Rhonchi heard left lower field, along with expiratory wheeze throughout the remainder of the fields, wheezes cleared with coughing. Heart: regular rate and rhythm Abdomen: soft, non-tender; bowel sounds normal; no masses or organomegaly; no guarding or rebound tenderness Musculoskeletal/extremities: Pulses +2 bilaterally, grossly symmetric, no dependent edema  Skin: warm and dry Neurologic: Grossly normal Psychological:  alert and cooperative; normal mood and affect     ASSESSMENT & PLAN:  1. Acute bronchitis, unspecified organism     Meds ordered this encounter  Medications  . azithromycin (ZITHROMAX) 250 MG tablet    Sig: Take 1 tablet (250 mg total) by mouth daily. Take first 2 tablets together,  then 1 every day until finished.    Dispense:  6 tablet    Refill:  0    Order Specific Question:   Supervising Provider    Answer:   Mardella LaymanHAGLER, BRIAN [id number]]  . benzonatate (TESSALON) 100 MG capsule    Sig: Take 1 capsule (100 mg total) by mouth every 8 (eight) hours.    Dispense:  21 capsule    Refill:  0    Order Specific Question:   Supervising Provider    Answer:   Mardella LaymanHAGLER, BRIAN [id number]]  . predniSONE (DELTASONE) 50 MG tablet    Sig: Take 1 tablet daily with food    Dispense:  5 tablet    Refill:  0    Order Specific Question:   Supervising Provider    Answer:   Mardella LaymanHAGLER, BRIAN [[id number]]    Reviewed expectations re: course of current medical issues. Questions answered. Outlined signs and symptoms indicating need for more acute intervention. Patient verbalized understanding. After Visit Summary given.    Procedures:     Results for orders placed or performed during the hospital encounter of 01/12/17  POCT rapid strep A [facility]  Result Value Ref Range   Streptococcus, Group A Screen (Direct) NEGATIVE NEGATIVE    Labs Reviewed  CULTURE, GROUP A STREP [facility])  POCT RAPID STREP A    Dg Chest 2 View  Result Date: 01/12/2017 CLINICAL DATA:  Cough for several days EXAM: CHEST  2 VIEW COMPARISON:  02/09/2014 FINDINGS: The heart size and mediastinal contours are within normal limits. Both lungs are clear. The visualized skeletal  structures are unremarkable. IMPRESSION: No active cardiopulmonary disease. Electronically Signed   By: Alcide Clever M.D.   On: 01/12/2017 13:39    Allergies  Allergen Reactions  . Lisinopril Rash    Palmar rash     PMHx, SurgHx, SocialHx, Medications, and Allergies were reviewed in the Visit Navigator and updated as appropriate.       Dorena Bodo, NP 01/12/17 1410

## 2017-01-12 NOTE — ED Triage Notes (Addendum)
Pt presents with c/o sore throat. The sore throat began on Sunday. He was seen here on Monday for the same complaint. Hes been taking over the counter mucous medication since with no improvement. He reports fevers on Sunday and Monday but none yesterday. He reprots congestion, productive cough.

## 2017-01-15 LAB — CULTURE, GROUP A STREP (THRC)

## 2017-01-18 ENCOUNTER — Encounter (HOSPITAL_COMMUNITY): Payer: Self-pay | Admitting: Emergency Medicine

## 2017-01-18 ENCOUNTER — Ambulatory Visit (HOSPITAL_COMMUNITY)
Admission: EM | Admit: 2017-01-18 | Discharge: 2017-01-18 | Disposition: A | Discharge status: Home / Self Care | Payer: Medicare Other | Attending: Family Medicine | Admitting: Family Medicine

## 2017-01-18 DIAGNOSIS — J40 Bronchitis, not specified as acute or chronic: Secondary | ICD-10-CM | POA: Diagnosis not present

## 2017-01-18 NOTE — Discharge Instructions (Signed)
You may follow up with your primary care doctor as needed.

## 2017-01-18 NOTE — ED Provider Notes (Signed)
  [facility]   [id number] 01/18/17 Arrival Time: 1003  ASSESSMENT & PLAN:  1. Bronchitis    Feeling much better. No further treatment necessary.  Will f/u as needed with PCP. Patient verbalized understanding. After Visit Summary given.   SUBJECTIVE:  Benjamin Hunter is a 56 y.o. male who presents for follow up. Diagnosed at last visit with bronchitis. Much better. No wheezing. Very mild dry cough. Sporadic. No fevers.  ROS: As per HPI.   OBJECTIVE:  Vitals:   01/18/17 1041  BP: 112/70  Pulse: 72  Temp: 98.3 F (36.8 C)  TempSrc: Oral  SpO2: 100%     General appearance: alert; no distress Lungs: clear to auscultation bilaterally Skin: warm and dry Psychological: alert and cooperative; normal mood and affect  Results for orders placed or performed during the hospital encounter of 01/12/17  Culture, group A strep  Result Value Ref Range   Specimen Description THROAT    Special Requests NONE    Culture NO GROUP A STREP (S.PYOGENES) ISOLATED    Report Status 01/15/2017 FINAL   POCT rapid strep A [facility])  Result Value Ref Range   Streptococcus, Group A Screen (Direct) NEGATIVE NEGATIVE     Allergies  Allergen Reactions  . Lisinopril Rash    Palmar rash         Mardella Layman, MD 01/18/17 1113

## 2017-01-18 NOTE — ED Triage Notes (Signed)
Pt here for recheck of bronchitis.  He states he is feeling better, but still has a slight cough, but the cough is better than it was.

## 2017-02-21 ENCOUNTER — Other Ambulatory Visit: Payer: Self-pay | Admitting: Pharmacist

## 2017-02-21 MED ORDER — BASAGLAR KWIKPEN 100 UNIT/ML ~~LOC~~ SOPN: 10.0000 [IU] | PEN_INJECTOR | Freq: Every day | SUBCUTANEOUS | 0 refills | Status: DC

## 2017-02-22 ENCOUNTER — Telehealth: Payer: Self-pay | Admitting: *Deleted

## 2017-02-22 NOTE — Telephone Encounter (Signed)
We had decreased his Lantus dose to prevent hypoglycemia at his last visit. Please encourage adherence with a diabetic diet, lifestyle modifications to prevent high sugars. I will see him at his upcoming visit with me.

## 2017-02-22 NOTE — Telephone Encounter (Signed)
Patient called stating his sugars have been a little more elevated. His fasting sugar this morning was 230. He states he took his medication, ate regular oatmeal with honey. His blood sugar rose to 278. He also states that he has had more sweets/cookies and sodas lately. RN advised him to drink water, exercise, work on following his dietary guidelines more closely, and to record his sugar results so he can bring them for follow up to his PCP. He has an appointment with Dr Venetia Night on 10/17. Patient verbalized understanding, agreement. Andree Coss, RN

## 2017-03-13 ENCOUNTER — Ambulatory Visit: Payer: Medicare Other | Attending: Family Medicine | Admitting: Family Medicine

## 2017-03-13 ENCOUNTER — Encounter: Payer: Self-pay | Admitting: Family Medicine

## 2017-03-13 VITALS — BP 119/84 | HR 84 | Temp 98.4°F | Resp 16 | Wt 203.8 lb

## 2017-03-13 DIAGNOSIS — F329 Major depressive disorder, single episode, unspecified: Secondary | ICD-10-CM | POA: Insufficient documentation

## 2017-03-13 DIAGNOSIS — Z23 Encounter for immunization: Secondary | ICD-10-CM | POA: Insufficient documentation

## 2017-03-13 DIAGNOSIS — Z794 Long term (current) use of insulin: Secondary | ICD-10-CM | POA: Insufficient documentation

## 2017-03-13 DIAGNOSIS — Z79899 Other long term (current) drug therapy: Secondary | ICD-10-CM | POA: Diagnosis not present

## 2017-03-13 DIAGNOSIS — I10 Essential (primary) hypertension: Secondary | ICD-10-CM | POA: Insufficient documentation

## 2017-03-13 DIAGNOSIS — B20 Human immunodeficiency virus [HIV] disease: Secondary | ICD-10-CM | POA: Insufficient documentation

## 2017-03-13 DIAGNOSIS — E119 Type 2 diabetes mellitus without complications: Secondary | ICD-10-CM | POA: Diagnosis not present

## 2017-03-13 DIAGNOSIS — E785 Hyperlipidemia, unspecified: Secondary | ICD-10-CM | POA: Diagnosis not present

## 2017-03-13 DIAGNOSIS — E7800 Pure hypercholesterolemia, unspecified: Secondary | ICD-10-CM | POA: Diagnosis not present

## 2017-03-13 LAB — POCT GLYCOSYLATED HEMOGLOBIN (HGB A1C): Hemoglobin A1C: 8.3

## 2017-03-13 LAB — GLUCOSE, POCT (MANUAL RESULT ENTRY)
POC Glucose: 298 mg/dl — AB (ref 70–99)
POC Glucose: 298 mg/dl — AB (ref 70–99)

## 2017-03-13 MED ORDER — BASAGLAR KWIKPEN 100 UNIT/ML ~~LOC~~ SOPN: 15.0000 [IU] | PEN_INJECTOR | Freq: Every day | SUBCUTANEOUS | 3 refills | Status: DC

## 2017-03-13 MED ORDER — ATORVASTATIN CALCIUM 20 MG PO TABS: 20.0000 mg | ORAL_TABLET | Freq: Every day | ORAL | 1 refills | Status: DC

## 2017-03-13 MED ORDER — EZETIMIBE 10 MG PO TABS: ORAL_TABLET | ORAL | 1 refills | Status: DC

## 2017-03-13 MED ORDER — FLUTICASONE PROPIONATE 50 MCG/ACT NA SUSP: 2.0000 | Freq: Every day | NASAL | 3 refills | Status: DC

## 2017-03-13 MED ORDER — FENOFIBRATE 48 MG PO TABS: ORAL_TABLET | ORAL | 1 refills | Status: DC

## 2017-03-13 MED ORDER — GLIPIZIDE 10 MG PO TABS: ORAL_TABLET | ORAL | 1 refills | Status: DC

## 2017-03-13 MED ORDER — AMLODIPINE BESYLATE 5 MG PO TABS: ORAL_TABLET | ORAL | 1 refills | Status: DC

## 2017-03-13 NOTE — Patient Instructions (Signed)
Diabetes Mellitus and Food It is important for you to manage your blood sugar (glucose) level. Your blood glucose level can be greatly affected by what you eat. Eating healthier foods in the appropriate amounts throughout the day at about the same time each day will help you control your blood glucose level. It can also help slow or prevent worsening of your diabetes mellitus. Healthy eating may even help you improve the level of your blood pressure and reach or maintain a healthy weight. General recommendations for healthful eating and cooking habits include:  Eating meals and snacks regularly. Avoid going long periods of time without eating to lose weight.  Eating a diet that consists mainly of plant-based foods, such as fruits, vegetables, nuts, legumes, and whole grains.  Using low-heat cooking methods, such as baking, instead of high-heat cooking methods, such as deep frying.  Work with your dietitian to make sure you understand how to use the Nutrition Facts information on food labels. How can food affect me? Carbohydrates Carbohydrates affect your blood glucose level more than any other type of food. Your dietitian will help you determine how many carbohydrates to eat at each meal and teach you how to count carbohydrates. Counting carbohydrates is important to keep your blood glucose at a healthy level, especially if you are using insulin or taking certain medicines for diabetes mellitus. Alcohol Alcohol can cause sudden decreases in blood glucose (hypoglycemia), especially if you use insulin or take certain medicines for diabetes mellitus. Hypoglycemia can be a life-threatening condition. Symptoms of hypoglycemia (sleepiness, dizziness, and disorientation) are similar to symptoms of having too much alcohol. If your health care provider has given you approval to drink alcohol, do so in moderation and use the following guidelines:  Women should not have more than one drink per day, and men  should not have more than two drinks per day. One drink is equal to: ? 12 oz of beer. ? 5 oz of wine. ? 1 oz of hard liquor.  Do not drink on an empty stomach.  Keep yourself hydrated. Have water, diet soda, or unsweetened iced tea.  Regular soda, juice, and other mixers might contain a lot of carbohydrates and should be counted.  What foods are not recommended? As you make food choices, it is important to remember that all foods are not the same. Some foods have fewer nutrients per serving than other foods, even though they might have the same number of calories or carbohydrates. It is difficult to get your body what it needs when you eat foods with fewer nutrients. Examples of foods that you should avoid that are high in calories and carbohydrates but low in nutrients include:  Trans fats (most processed foods list trans fats on the Nutrition Facts label).  Regular soda.  Juice.  Candy.  Sweets, such as cake, pie, doughnuts, and cookies.  Fried foods.  What foods can I eat? Eat nutrient-rich foods, which will nourish your body and keep you healthy. The food you should eat also will depend on several factors, including:  The calories you need.  The medicines you take.  Your weight.  Your blood glucose level.  Your blood pressure level.  Your cholesterol level.  You should eat a variety of foods, including:  Protein. ? Lean cuts of meat. ? Proteins low in saturated fats, such as fish, egg whites, and beans. Avoid processed meats.  Fruits and vegetables. ? Fruits and vegetables that may help control blood glucose levels, such as apples,   mangoes, and yams.  Dairy products. ? Choose fat-free or low-fat dairy products, such as milk, yogurt, and cheese.  Grains, bread, pasta, and rice. ? Choose whole grain products, such as multigrain bread, whole oats, and brown rice. These foods may help control blood pressure.  Fats. ? Foods containing healthful fats, such as  nuts, avocado, olive oil, canola oil, and fish.  Does everyone with diabetes mellitus have the same meal plan? Because every person with diabetes mellitus is different, there is not one meal plan that works for everyone. It is very important that you meet with a dietitian who will help you create a meal plan that is just right for you. This information is not intended to replace advice given to you by your health care provider. Make sure you discuss any questions you have with your health care provider. Document Released: 02/10/2005 Document Revised: 10/22/2015 Document Reviewed: 04/12/2013 Elsevier Interactive Patient Education  2017 Elsevier Inc.  

## 2017-03-13 NOTE — Progress Notes (Signed)
Subjective:  Patient ID: Benjamin Hunter, male    DOB: Dec 07, 1960  Age: 56 y.o. MRN: [id number]  CC: Diabetes  HPI Benjamin Hunter is a 56 year old male with a history of hypertension, type 2 diabetes mellitus (A1c 8.3 which is up from 5.4), HIV (on antiretrovirals therapy, CD4 count of 720, undetectable viral load) who comes into the clinic for a follow-up visit.  He endorses indulging in cookies and sweets with resulting elevation in his blood sugars to the 250-375 range and he has been taking 10 units of insulin. He denies numbness in extremities, hypoglycemia and does not exercise regularly. He brings in a list of the foods he eats to discuss which are more appropriate for his diabetes.  He has been compliant with his statin and gemfibrozil for his hyperlipidemia and denies myalgias. His blood pressure is normal and he has been compliant with his amlodipine; denies dizziness, chest pains or shortness of breath.  For his infectious disease he follows with Dr. Bridget Hartshorn.  Past Medical History:  Diagnosis Date  . Arthritis   . Depression   . Diabetes mellitus   . HIV (human immunodeficiency virus infection) ([place])   . Hyperlipidemia   . Hypertension     No past surgical history on file.  Allergies  Allergen Reactions  . Lisinopril Rash    Palmar rash      Outpatient Medications Prior to Visit  Medication Sig Dispense Refill  . ACCU-CHEK SOFTCLIX LANCETS lancets Use as instructed for 3 times daily testing of blood sugar 100 each 12  . Alcohol Swabs PADS 1 each by Does not apply route 3 (three) times daily before meals. 90 each 11  . cyclobenzaprine (FLEXERIL) 10 MG tablet Take 1 tablet (10 mg total) by mouth at bedtime. 10 tablet 0  . cyclobenzaprine (FLEXERIL) 10 MG tablet Take 1 tablet (10 mg total) by mouth at bedtime. 10 tablet 0  . elvitegravir-cobicistat-emtricitabine-tenofovir (GENVOYA) 150-150-200-10 MG TABS tablet Take 1 tablet by mouth daily with breakfast. 30  tablet 11  . glucose blood (ACCU-CHEK AVIVA PLUS) test strip Use as directed for 3 times daily testing of blood sugar. E11.9 300 each 3  . glucose blood (ACCU-CHEK AVIVA) test strip Use as instructed for 3 times daily testing of blood sugar 100 each 12  . Insulin Pen Needle 31G X 5 MM MISC 1 each by Does not apply route at bedtime. 30 each 5  . Lancet Devices (ACCU-CHEK SOFTCLIX) lancets Use as instructed for 3 times daily testing of blood sugar. 1 each 0  . naproxen (NAPROSYN) 500 MG tablet Take 1 tablet (500 mg total) by mouth 2 (two) times daily with a meal. 14 tablet 0  . amLODipine (NORVASC) 5 MG tablet TAKE 1 TABLET(5 MG) BY MOUTH DAILY 90 tablet 1  . atorvastatin (LIPITOR) 20 MG tablet Take 1 tablet (20 mg total) by mouth daily. 90 tablet 1  . ezetimibe (ZETIA) 10 MG tablet TAKE 1 TABLET(10 MG) BY MOUTH DAILY 30 tablet 5  . fenofibrate (TRICOR) 48 MG tablet TAKE 1 TABLET(48 MG) BY MOUTH DAILY 90 tablet 0  . fluticasone (FLONASE) 50 MCG/ACT nasal spray Place 2 sprays into both nostrils daily. 16 g 3  . glipiZIDE (GLUCOTROL) 10 MG tablet TAKE 1 TABLET(10 MG) BY MOUTH TWICE DAILY BEFORE A MEAL 180 tablet 1  . Insulin Glargine (BASAGLAR KWIKPEN) 100 UNIT/ML SOPN Inject 0.1 mLs (10 Units total) into the skin at bedtime. 15 mL 0  . azithromycin (ZITHROMAX) 250 MG tablet  Take 1 tablet (250 mg total) by mouth daily. Take first 2 tablets together, then 1 every day until finished. (Patient not taking: Reported on 03/13/2017) 6 tablet 0  . benzonatate (TESSALON) 100 MG capsule Take 1 capsule (100 mg total) by mouth every 8 (eight) hours. (Patient not taking: Reported on 03/13/2017) 21 capsule 0  . ipratropium (ATROVENT) 0.06 % nasal spray Place 2 sprays into both nostrils 4 (four) times daily. (Patient not taking: Reported on 03/13/2017) 15 mL 12  . nystatin (MYCOSTATIN) 100000 UNIT/ML suspension Take 5 mLs (500,000 Units total) by mouth 4 (four) times daily. Prn oral thrush (Patient not taking: Reported  on 03/13/2017) 60 mL 1  . predniSONE (DELTASONE) 50 MG tablet Take 1 tablet daily with food (Patient not taking: Reported on 03/13/2017) 5 tablet 0   No facility-administered medications prior to visit.     ROS Review of Systems  Constitutional: Negative for activity change and appetite change.  HENT: Negative for sinus pressure and sore throat.   Eyes: Negative for visual disturbance.  Respiratory: Negative for cough, chest tightness and shortness of breath.   Cardiovascular: Negative for chest pain and leg swelling.  Gastrointestinal: Negative for abdominal distention, abdominal pain, constipation and diarrhea.  Endocrine: Negative.   Genitourinary: Negative for dysuria.  Musculoskeletal: Negative for joint swelling and myalgias.  Skin: Negative for rash.  Allergic/Immunologic: Negative.   Neurological: Negative for weakness, light-headedness and numbness.  Psychiatric/Behavioral: Negative for dysphoric mood and suicidal ideas.    Objective:  BP 119/84 (BP Location: Left Arm, Patient Position: Sitting, Cuff Size: Large)   Pulse 84   Temp 98.4 F (36.9 C) (Oral)   Resp 16   Wt 203 lb 12.8 oz (92.4 kg)   SpO2 98%   BMI 28.42 kg/m   BP/Weight 03/13/2017 01/18/2017 06/21/4823  Systolic BP 003 704 888  Diastolic BP 84 70 66  Wt. (Lbs) 203.8 - 200  BMI 28.42 - 27.89      Physical Exam  Constitutional: He is oriented to person, place, and time. He appears well-developed and well-nourished.  Neck: No JVD present.  Cardiovascular: Normal rate, normal heart sounds and intact distal pulses.   No murmur heard. Pulmonary/Chest: Effort normal and breath sounds normal. He has no wheezes. He has no rales. He exhibits no tenderness.  Abdominal: Soft. Bowel sounds are normal. He exhibits no distension and no mass. There is no tenderness.  Musculoskeletal: Normal range of motion.  Neurological: He is alert and oriented to person, place, and time.  Skin: Skin is warm and dry.    Psychiatric: He has a normal mood and affect.     CMP Latest Ref Rng & Units 10/27/2016 04/28/2016 11/25/2015  Glucose 65 - 99 mg/dL 55(L) 66 437(H)  BUN 7 - 25 mg/dL _0 Creatinine 0.70 - 1.33 mg/dL 1.36(H) 1.19 1.25  Sodium 135 - 146 mmol/L 139 139 128(L)  Potassium 3.5 - 5.3 mmol/L 4.2 3.9 4.3  Chloride 98 - 110 mmol/L 105 108 98  CO2 20 - 31 mmol/L _1 Calcium 8.6 - 10.3 mg/dL 9.5 9.2 9.3  Total Protein 6.1 - 8.1 g/dL 7.4 7.4 7.6  Total Bilirubin 0.2 - 1.2 mg/dL 0.4 0.5 0.6  Alkaline Phos 40 - 115 U/L 69 55 85  AST 10 - 35 U/L _2 ALT 9 - 46 U/L _3 Lipid Panel     Component Value Date/Time   CHOL 169  04/28/2016 1050   TRIG 107 04/28/2016 1050   HDL 43 04/28/2016 1050   CHOLHDL 3.9 04/28/2016 1050   VLDL 21 04/28/2016 1050   LDLCALC 105 (H) 04/28/2016 1050       Lab Results  Component Value Date   HGBA1C 8.3 03/13/2017    Assessment & Plan:   1. Type 2 diabetes mellitus without complication, with long-term current use of insulin (HCC) Uncontrolled with A1c of 8.3 which has increased from 5.4 Increased lantus dose Emphasized need to comply with diabetic diet, Exercise, lifestyle modification We have discussed his meals and he would need to cut out his sweets and desserts and juices - POCT glucose (manual entry) - POCT glycosylated hemoglobin (Hb A1C) - Glucose (CBG) - CMP14+EGFR; Future - Microalbumin/Creatinine Ratio, Urine; Future - glipiZIDE (GLUCOTROL) 10 MG tablet; TAKE 1 TABLET(10 MG) BY MOUTH TWICE DAILY BEFORE A MEAL  Dispense: 180 tablet; Refill: 1  2. Essential hypertension, benign Controlled Low sodium, DASH diet - CMP14+EGFR; Future - amLODipine (NORVASC) 5 MG tablet; TAKE 1 TABLET(5 MG) BY MOUTH DAILY  Dispense: 90 tablet; Refill: 1  3. Pure hypercholesterolemia We'll check lipid panel and adjust statin as indicated - Lipid panel; Future - ezetimibe (ZETIA) 10 MG tablet; TAKE 1 TABLET(10 MG) BY MOUTH DAILY   Dispense: 90 tablet; Refill: 1 - fenofibrate (TRICOR) 48 MG tablet; TAKE 1 TABLET(48 MG) BY MOUTH DAILY  Dispense: 90 tablet; Refill: 1 - atorvastatin (LIPITOR) 20 MG tablet; Take 1 tablet (20 mg total) by mouth daily.  Dispense: 90 tablet; Refill: 1  4. Need for influenza vaccination - Flu Vaccine QUAD 36+ mos IM   Meds ordered this encounter  Medications  . amLODipine (NORVASC) 5 MG tablet    Sig: TAKE 1 TABLET(5 MG) BY MOUTH DAILY    Dispense:  90 tablet    Refill:  1    **Patient requests 90 days supply**  . glipiZIDE (GLUCOTROL) 10 MG tablet    Sig: TAKE 1 TABLET(10 MG) BY MOUTH TWICE DAILY BEFORE A MEAL    Dispense:  180 tablet    Refill:  1    **Patient requests 90 days supply**  . ezetimibe (ZETIA) 10 MG tablet    Sig: TAKE 1 TABLET(10 MG) BY MOUTH DAILY    Dispense:  90 tablet    Refill:  1  . fenofibrate (TRICOR) 48 MG tablet    Sig: TAKE 1 TABLET(48 MG) BY MOUTH DAILY    Dispense:  90 tablet    Refill:  1  . Insulin Glargine (BASAGLAR KWIKPEN) 100 UNIT/ML SOPN    Sig: Inject 0.15 mLs (15 Units total) into the skin at bedtime.    Dispense:  15 mL    Refill:  3    Discontinue previous dose  . atorvastatin (LIPITOR) 20 MG tablet    Sig: Take 1 tablet (20 mg total) by mouth daily.    Dispense:  90 tablet    Refill:  1  . fluticasone (FLONASE) 50 MCG/ACT nasal spray    Sig: Place 2 sprays into both nostrils daily.    Dispense:  16 g    Refill:  3    Follow-up: Return in about 3 months (around 06/13/2017) for follow up on Diabetic diet.   Arnoldo Morale MD

## 2017-03-13 NOTE — Progress Notes (Signed)
Concerns with elevated blood sugar

## 2017-03-15 ENCOUNTER — Ambulatory Visit: Payer: Medicare Other | Attending: Family Medicine

## 2017-03-15 ENCOUNTER — Ambulatory Visit: Payer: Medicare Other | Admitting: Family Medicine

## 2017-03-15 DIAGNOSIS — Z794 Long term (current) use of insulin: Secondary | ICD-10-CM | POA: Diagnosis not present

## 2017-03-15 DIAGNOSIS — E7800 Pure hypercholesterolemia, unspecified: Secondary | ICD-10-CM | POA: Insufficient documentation

## 2017-03-15 DIAGNOSIS — E119 Type 2 diabetes mellitus without complications: Secondary | ICD-10-CM | POA: Diagnosis not present

## 2017-03-15 DIAGNOSIS — I10 Essential (primary) hypertension: Secondary | ICD-10-CM

## 2017-03-15 NOTE — Progress Notes (Signed)
Patient here for lab visit  

## 2017-03-16 ENCOUNTER — Other Ambulatory Visit: Payer: Medicare Other

## 2017-03-16 LAB — CMP14+EGFR
ALBUMIN: 4.6 g/dL (ref 3.5–5.5)
ALT: 26 IU/L (ref 0–44)
AST: 18 IU/L (ref 0–40)
Albumin/Globulin Ratio: 1.4 (ref 1.2–2.2)
Alkaline Phosphatase: 82 IU/L (ref 39–117)
BUN / CREAT RATIO: 15 (ref 9–20)
BUN: 18 mg/dL (ref 6–24)
Bilirubin Total: 0.5 mg/dL (ref 0.0–1.2)
CALCIUM: 9.9 mg/dL (ref 8.7–10.2)
CO2: 23 mmol/L (ref 20–29)
Chloride: 100 mmol/L (ref 96–106)
Creatinine, Ser: 1.19 mg/dL (ref 0.76–1.27)
GFR, EST AFRICAN AMERICAN: 78 mL/min/{1.73_m2} (ref >59)
GFR, EST NON AFRICAN AMERICAN: 67 mL/min/{1.73_m2} (ref >59)
GLUCOSE: 233 mg/dL — AB (ref 65–99)
Globulin, Total: 3.4 g/dL (ref 1.5–4.5)
Potassium: 4.5 mmol/L (ref 3.5–5.2)
Sodium: 137 mmol/L (ref 134–144)
TOTAL PROTEIN: 8 g/dL (ref 6.0–8.5)

## 2017-03-16 LAB — MICROALBUMIN / CREATININE URINE RATIO
CREATININE, UR: 181.8 mg/dL
Microalb/Creat Ratio: 41.6 mg/g creat — ABNORMAL HIGH (ref 0.0–30.0)
Microalbumin, Urine: 75.7 ug/mL

## 2017-03-16 LAB — LIPID PANEL
CHOL/HDL RATIO: 3.8 ratio (ref 0.0–5.0)
Cholesterol, Total: 191 mg/dL (ref 100–199)
HDL: 50 mg/dL (ref >39)
LDL CALC: 121 mg/dL — AB (ref 0–99)
Triglycerides: 101 mg/dL (ref 0–149)
VLDL CHOLESTEROL CAL: 20 mg/dL (ref 5–40)

## 2017-03-17 ENCOUNTER — Other Ambulatory Visit: Payer: Self-pay | Admitting: Family Medicine

## 2017-03-17 DIAGNOSIS — E7800 Pure hypercholesterolemia, unspecified: Secondary | ICD-10-CM

## 2017-03-17 MED ORDER — ATORVASTATIN CALCIUM 40 MG PO TABS: 40.0000 mg | ORAL_TABLET | Freq: Every day | ORAL | 3 refills | Status: DC

## 2017-04-18 ENCOUNTER — Other Ambulatory Visit: Payer: Self-pay | Admitting: Family Medicine

## 2017-04-18 DIAGNOSIS — E0800 Diabetes mellitus due to underlying condition with hyperosmolarity without nonketotic hyperglycemic-hyperosmolar coma (NKHHC): Secondary | ICD-10-CM

## 2017-05-20 ENCOUNTER — Other Ambulatory Visit: Payer: Self-pay | Admitting: Internal Medicine

## 2017-05-20 DIAGNOSIS — B20 Human immunodeficiency virus [HIV] disease: Secondary | ICD-10-CM

## 2017-05-24 ENCOUNTER — Telehealth: Payer: Self-pay | Admitting: *Deleted

## 2017-05-24 ENCOUNTER — Other Ambulatory Visit: Payer: Self-pay | Admitting: *Deleted

## 2017-05-24 DIAGNOSIS — B20 Human immunodeficiency virus [HIV] disease: Secondary | ICD-10-CM

## 2017-05-24 MED ORDER — BICTEGRAVIR-EMTRICITAB-TENOFOV 50-200-25 MG PO TABS: 1.0000 | ORAL_TABLET | Freq: Every day | ORAL | 5 refills | Status: DC

## 2017-05-24 NOTE — Telephone Encounter (Signed)
Patient did not renew his SPAP. He will come tomorrow to start the process, may need to meet with [place] for medication assistance.

## 2017-05-24 NOTE — Telephone Encounter (Signed)
Patient asking for refill of Genvoya. Drug-drug interaction flag present between Genvoya and atorvastatin 40 mg (ordered 03/13/17 by PCP). Please advise. Andree CossHowell, Barnes Florek M, RN

## 2017-05-24 NOTE — Telephone Encounter (Signed)
Please change Genvoya to [employer].

## 2017-05-25 ENCOUNTER — Ambulatory Visit: Payer: Medicare Other

## 2017-05-26 ENCOUNTER — Encounter: Payer: Self-pay | Admitting: Internal Medicine

## 2017-05-31 ENCOUNTER — Ambulatory Visit: Payer: Medicare Other

## 2017-06-01 ENCOUNTER — Ambulatory Visit: Payer: Medicare Other

## 2017-06-02 ENCOUNTER — Encounter: Payer: Self-pay | Admitting: Internal Medicine

## 2017-06-13 ENCOUNTER — Ambulatory Visit: Payer: Medicare Other | Admitting: Family Medicine

## 2017-06-21 ENCOUNTER — Ambulatory Visit (HOSPITAL_COMMUNITY)
Admission: EM | Admit: 2017-06-21 | Discharge: 2017-06-21 | Disposition: A | Discharge status: Home / Self Care | Payer: Medicare Other | Attending: Family Medicine | Admitting: Family Medicine

## 2017-06-21 ENCOUNTER — Encounter (HOSPITAL_COMMUNITY): Payer: Self-pay | Admitting: Emergency Medicine

## 2017-06-21 DIAGNOSIS — J019 Acute sinusitis, unspecified: Secondary | ICD-10-CM

## 2017-06-21 MED ORDER — AMOXICILLIN-POT CLAVULANATE 875-125 MG PO TABS: 1.0000 | ORAL_TABLET | Freq: Two times a day (BID) | ORAL | 0 refills | Status: DC

## 2017-06-21 NOTE — ED Provider Notes (Signed)
[facility]    CSN: [id number] Arrival date & time: 06/21/17  1549     History   Chief Complaint Chief Complaint  Patient presents with  . URI    HPI Carilyn GoodpastureKenneth Pilley is a 57 y.o. male.   57 year old male, with history of diabetes, HIV, hyperlipidemia, hypertension, presenting today with cold symptoms.  Patient states that he has had nasal congestion, postnasal drip, sore throat and headache for the past 5 days.  States that his symptoms are gradually worsening.  He said no fever, chills, neck pain or stiffness, cough, chest pain or shortness of breath.  Denies any known recent sick contacts.   The history is provided by the patient.  URI  Presenting symptoms: congestion, facial pain, rhinorrhea and sore throat   Presenting symptoms: no cough, no ear pain, no fatigue and no fever   Severity:  Moderate Onset quality:  Gradual Duration:  5 days Timing:  Constant Progression:  Worsening Chronicity:  New Relieved by:  Nothing Worsened by:  Nothing Ineffective treatments:  None tried Associated symptoms: sinus pain   Associated symptoms: no arthralgias, no headaches, no myalgias, no neck pain, no sneezing, no swollen glands and no wheezing   Risk factors: not elderly, no chronic cardiac disease, no chronic kidney disease, no chronic respiratory disease, no immunosuppression, no recent illness, no recent travel and no sick contacts     Past Medical History:  Diagnosis Date  . Arthritis   . Depression   . Diabetes mellitus   . HIV (human immunodeficiency virus infection) (HCC)   . Hyperlipidemia   . Hypertension     Patient Active Problem List   Diagnosis Date Noted  . 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 Depression 01/14/2013  . Rash and nonspecific skin eruption 10/11/2012  . Recurrent low back pain 10/11/2012  . Left facial numbness 08/20/2012  . Seasonal allergies 09/20/2011  . Polyarthritis 09/06/2011  . Diabetes mellitus  (HCC) 03/16/2009  . Human immunodeficiency virus (HIV) disease (HCC) 07/17/2008  . Hyperlipidemia 07/17/2008  . Hypertension 07/17/2008    History reviewed. No pertinent surgical history.     Home Medications    Prior to Admission medications   Medication Sig Start Date End Date Taking? Authorizing Provider  ACCU-CHEK SOFTCLIX LANCETS lancets Use as instructed for 3 times daily testing of blood sugar 12/07/15  Yes Amao, Enobong, MD  amLODipine (NORVASC) 5 MG tablet TAKE 1 TABLET(5 MG) BY MOUTH DAILY 03/13/17  Yes Amao, Odette HornsEnobong, MD  atorvastatin (LIPITOR) 40 MG tablet Take 1 tablet (40 mg total) by mouth daily. 03/17/17  Yes Jaclyn ShaggyAmao, Enobong, MD  bictegravir-emtricitabine-tenofovir AF (BIKTARVY) 50-200-25 MG TABS tablet Take 1 tablet by mouth daily. 05/24/17  Yes Cliffton Astersampbell, John, MD  cyclobenzaprine (FLEXERIL) 10 MG tablet Take 1 tablet (10 mg total) by mouth at bedtime. 07/12/16  Yes Deatra Canterxford, William J, FNP  cyclobenzaprine (FLEXERIL) 10 MG tablet Take 1 tablet (10 mg total) by mouth at bedtime. 01/06/17  Yes Deatra Canterxford, William J, FNP  ezetimibe (ZETIA) 10 MG tablet TAKE 1 TABLET(10 MG) BY MOUTH DAILY 03/13/17  Yes Amao, Enobong, MD  fenofibrate (TRICOR) 48 MG tablet TAKE 1 TABLET(48 MG) BY MOUTH DAILY 03/13/17  Yes Amao, Enobong, MD  fluticasone (FLONASE) 50 MCG/ACT nasal spray Place 2 sprays into both nostrils daily. 03/13/17  Yes Amao, Odette HornsEnobong, MD  glipiZIDE (GLUCOTROL) 10 MG tablet TAKE 1 TABLET(10 MG) BY MOUTH TWICE DAILY BEFORE A MEAL 03/13/17  Yes Jaclyn ShaggyAmao, Enobong, MD  glucose blood (ACCU-CHEK AVIVA PLUS) test strip Use as directed for 3 times daily testing of blood sugar. E11.9 12/30/16  Yes Jaclyn Shaggy, MD  glucose blood (ACCU-CHEK AVIVA) test strip Use as instructed for 3 times daily testing of blood sugar 12/07/15  Yes Amao, Odette Horns, MD  Insulin Glargine (BASAGLAR KWIKPEN) 100 UNIT/ML SOPN Inject 0.15 mLs (15 Units total) into the skin at bedtime. 03/13/17  Yes Jaclyn Shaggy, MD  Lancet  Devices [facility]) lancets Use as instructed for 3 times daily testing of blood sugar. 12/07/15  Yes Jaclyn Shaggy, MD  naproxen (NAPROSYN) 500 MG tablet Take 1 tablet (500 mg total) by mouth 2 (two) times daily with a meal. 01/06/17  Yes Deatra Canter, FNP  Alcohol Swabs PADS 1 each by Does not apply route 3 (three) times daily before meals. 11/16/16   Jaclyn Shaggy, MD  amoxicillin-clavulanate (AUGMENTIN) 875-125 MG tablet Take 1 tablet by mouth every 12 (twelve) hours. 06/21/17   Blue, Olivia C, PA-C  B-D UF III MINI PEN NEEDLES 31G X 5 MM MISC INJECT AT BEDTIME 04/18/17   Jaclyn Shaggy, MD    Family History Family History  Problem Relation Age of Onset  . Hypertension Mother     Social History Social History   Tobacco Use  . Smoking status: Never Smoker  . Smokeless tobacco: Never Used  Substance Use Topics  . Alcohol use: Yes    Alcohol/week: 0.6 - 1.2 oz    Types: 1 - 2 Cans of beer per week    Comment: occasional   . Drug use: No     Allergies   Shrimp [shellfish allergy] and Lisinopril   Review of Systems Review of Systems  Constitutional: Negative for chills, fatigue and fever.  HENT: Positive for congestion, rhinorrhea, sinus pressure, sinus pain and sore throat. Negative for ear pain and sneezing.   Eyes: Negative for pain and visual disturbance.  Respiratory: Negative for cough, shortness of breath and wheezing.   Cardiovascular: Negative for chest pain and palpitations.  Gastrointestinal: Negative for abdominal pain and vomiting.  Genitourinary: Negative for dysuria and hematuria.  Musculoskeletal: Negative for arthralgias, back pain, myalgias and neck pain.  Skin: Negative for color change and rash.  Neurological: Negative for seizures, syncope and headaches.  All other systems reviewed and are negative.    Physical Exam Triage Vital Signs ED Triage Vitals  Enc Vitals Group     BP 06/21/17 1621 138/83     Pulse Rate 06/21/17 1621 71      Resp 06/21/17 1621 18     Temp 06/21/17 1621 98.4 F (36.9 C)     Temp Source 06/21/17 1621 Oral     SpO2 06/21/17 1621 100 %     Weight --      Height --      Head Circumference --      Peak Flow --      Pain Score 06/21/17 1618 5     Pain Loc --      Pain Edu? --      Excl. in GC? --    No data found.  Updated Vital Signs BP 138/83 (BP Location: Right Arm)   Pulse 71   Temp 98.4 F (36.9 C) (Oral)   Resp 18   SpO2 100%   Visual Acuity Right Eye Distance:   Left Eye Distance:   Bilateral Distance:    Right Eye Near:   Left Eye Near:    Bilateral Near:  Physical Exam  Constitutional: He appears well-developed and well-nourished.  HENT:  Head: Normocephalic and atraumatic.  Right Ear: Hearing, tympanic membrane, external ear and ear canal normal.  Left Ear: Hearing, tympanic membrane, external ear and ear canal normal.  Nose: Right sinus exhibits maxillary sinus tenderness. Left sinus exhibits maxillary sinus tenderness.  Mouth/Throat: Uvula is midline and oropharynx is clear and moist. No oropharyngeal exudate, posterior oropharyngeal edema, posterior oropharyngeal erythema or tonsillar abscesses.  Eyes: Conjunctivae are normal.  Neck: Neck supple.  Cardiovascular: Normal rate and regular rhythm.  No murmur heard. Pulmonary/Chest: Effort normal and breath sounds normal. No stridor. No respiratory distress. He has no decreased breath sounds. He has no wheezes. He has no rhonchi. He has no rales.  Abdominal: Soft. There is no tenderness.  Musculoskeletal: He exhibits no edema.  Neurological: He is alert.  Skin: Skin is warm and dry.  Psychiatric: He has a normal mood and affect.  Nursing note and vitals reviewed.    UC Treatments / Results  Labs (all labs ordered are listed, but only abnormal results are displayed) Labs Reviewed - No data to display  EKG  EKG Interpretation None       Radiology No results found.  Procedures Procedures (including  critical care time)  Medications Ordered in UC Medications - No data to display   Initial Impression / Assessment and Plan / UC Course  I have reviewed the triage vital signs and the nursing notes.  Pertinent labs & imaging results that were available during my care of the patient were reviewed by me and considered in my medical decision making (see chart for details).     Cold symptoms and sinus symptoms for the past 5 days.  Due to patient's HIV positive status, will treat with Augmentin Recommended continued use of over-the-counter nasal sprays, cough medication and Mucinex.  Final Clinical Impressions(s) / UC Diagnoses   Final diagnoses:  Acute sinusitis, recurrence not specified, unspecified location    ED Discharge Orders        Ordered    amoxicillin-clavulanate (AUGMENTIN) 875-125 MG tablet  Every 12 hours     06/21/17 1637       Controlled Substance Prescriptions [place] Controlled Substance Registry consulted? Not Applicable   Alecia Lemming, [place] 06/21/17 1652

## 2017-06-21 NOTE — ED Triage Notes (Signed)
PT C/O: cold sx onset 2 days associated w/PND, nasal drainage/congestion, abd pain   DENIES: fevers  TAKING MEDS: OTC cold meds  A&O x4... NAD... Ambulatory

## 2017-07-04 ENCOUNTER — Encounter: Payer: Self-pay | Admitting: Family Medicine

## 2017-07-04 ENCOUNTER — Ambulatory Visit: Payer: Medicare Other | Attending: Family Medicine | Admitting: Family Medicine

## 2017-07-04 VITALS — BP 134/79 | HR 72 | Temp 97.8°F | Ht 71.0 in | Wt 201.6 lb

## 2017-07-04 DIAGNOSIS — J018 Other acute sinusitis: Secondary | ICD-10-CM

## 2017-07-04 DIAGNOSIS — B20 Human immunodeficiency virus [HIV] disease: Secondary | ICD-10-CM

## 2017-07-04 DIAGNOSIS — Z91013 Allergy to seafood: Secondary | ICD-10-CM | POA: Diagnosis not present

## 2017-07-04 DIAGNOSIS — I10 Essential (primary) hypertension: Secondary | ICD-10-CM | POA: Diagnosis not present

## 2017-07-04 DIAGNOSIS — E119 Type 2 diabetes mellitus without complications: Secondary | ICD-10-CM

## 2017-07-04 DIAGNOSIS — J019 Acute sinusitis, unspecified: Secondary | ICD-10-CM | POA: Diagnosis not present

## 2017-07-04 DIAGNOSIS — E7800 Pure hypercholesterolemia, unspecified: Secondary | ICD-10-CM

## 2017-07-04 DIAGNOSIS — Z23 Encounter for immunization: Secondary | ICD-10-CM | POA: Diagnosis not present

## 2017-07-04 DIAGNOSIS — Z794 Long term (current) use of insulin: Secondary | ICD-10-CM

## 2017-07-04 DIAGNOSIS — Z79899 Other long term (current) drug therapy: Secondary | ICD-10-CM | POA: Diagnosis not present

## 2017-07-04 DIAGNOSIS — Z888 Allergy status to other drugs, medicaments and biological substances status: Secondary | ICD-10-CM | POA: Insufficient documentation

## 2017-07-04 LAB — GLUCOSE, POCT (MANUAL RESULT ENTRY): POC GLUCOSE: 84 mg/dL (ref 70–99)

## 2017-07-04 LAB — POCT GLYCOSYLATED HEMOGLOBIN (HGB A1C): HEMOGLOBIN A1C: 6

## 2017-07-04 MED ORDER — CETIRIZINE HCL 10 MG PO TABS: 10.0000 mg | ORAL_TABLET | Freq: Every day | ORAL | 1 refills | Status: DC

## 2017-07-04 MED ORDER — GLIPIZIDE 10 MG PO TABS: ORAL_TABLET | ORAL | 1 refills | Status: DC

## 2017-07-04 MED ORDER — INSULIN GLARGINE 100 UNIT/ML SOLOSTAR PEN: 15.0000 [IU] | PEN_INJECTOR | Freq: Every day | SUBCUTANEOUS | 3 refills | Status: DC

## 2017-07-04 MED ORDER — ATORVASTATIN CALCIUM 40 MG PO TABS: 40.0000 mg | ORAL_TABLET | Freq: Every day | ORAL | 1 refills | Status: DC

## 2017-07-04 MED ORDER — AMLODIPINE BESYLATE 5 MG PO TABS: ORAL_TABLET | ORAL | 1 refills | Status: DC

## 2017-07-04 NOTE — Patient Instructions (Signed)

## 2017-07-04 NOTE — Progress Notes (Signed)
Subjective:  Patient ID: Benjamin Hunter, male    DOB: 1961-02-02  Age: 57 y.o. MRN: [id number]  CC: Diabetes   HPI Benjamin Hunter is a 57 year old male with a history of hypertension, type 2 diabetes mellitus (A1c 6.0), HIV (on antiretrovirals therapy, CD4 count of 720, undetectable viral load) who comes into the clinic for a follow-up visit.  His A1c is 6.0 and has improved from 8.3 previously; he endorses compliance with his Lantus, metformin and has been working on a diabetic diet. Denies visual concerns, numbness in extremities or hypoglycemia. He is up-to-date on an annual eye exam. He tolerates his antihypertensive and denies adverse effects from it. Also takes his atorvastatin and does not complain of myalgias or other adverse effects.  He was treated for an acute sinus infection 2 weeks ago at [facility] ED where he was placed on augmentin which he has completed and endorses residual nasal congestion however he denies drip, cough, sinus pressure or pain, fever or body aches. He has an upcoming appointment with infectious disease later this year. He has no other acute concerns today but would like his long-acting insulin switched to Lantus from [employer].  Past Medical History:  Diagnosis Date  . Arthritis   . Depression   . Diabetes mellitus   . HIV (human immunodeficiency virus infection) (HCC)   . Hyperlipidemia   . Hypertension     History reviewed. No pertinent surgical history.  Allergies  Allergen Reactions  . Shrimp [Shellfish Allergy] Nausea And Vomiting  . Lisinopril Rash    Palmar rash      Outpatient Medications Prior to Visit  Medication Sig Dispense Refill  . ACCU-CHEK SOFTCLIX LANCETS lancets Use as instructed for 3 times daily testing of blood sugar 100 each 12  . Alcohol Swabs PADS 1 each by Does not apply route 3 (three) times daily before meals. 90 each 11  . B-D UF III MINI PEN NEEDLES 31G X 5 MM MISC INJECT AT BEDTIME 100 each 2  .  bictegravir-emtricitabine-tenofovir AF (BIKTARVY) 50-200-25 MG TABS tablet Take 1 tablet by mouth daily. 30 tablet 5  . ezetimibe (ZETIA) 10 MG tablet TAKE 1 TABLET(10 MG) BY MOUTH DAILY 90 tablet 1  . fenofibrate (TRICOR) 48 MG tablet TAKE 1 TABLET(48 MG) BY MOUTH DAILY 90 tablet 1  . fluticasone (FLONASE) 50 MCG/ACT nasal spray Place 2 sprays into both nostrils daily. 16 g 3  . glucose blood (ACCU-CHEK AVIVA PLUS) test strip Use as directed for 3 times daily testing of blood sugar. E11.9 300 each 3  . glucose blood (ACCU-CHEK AVIVA) test strip Use as instructed for 3 times daily testing of blood sugar 100 each 12  . Lancet Devices (ACCU-CHEK SOFTCLIX) lancets Use as instructed for 3 times daily testing of blood sugar. 1 each 0  . amLODipine (NORVASC) 5 MG tablet TAKE 1 TABLET(5 MG) BY MOUTH DAILY 90 tablet 1  . atorvastatin (LIPITOR) 40 MG tablet Take 1 tablet (40 mg total) by mouth daily. 30 tablet 3  . glipiZIDE (GLUCOTROL) 10 MG tablet TAKE 1 TABLET(10 MG) BY MOUTH TWICE DAILY BEFORE A MEAL 180 tablet 1  . Insulin Glargine (BASAGLAR KWIKPEN) 100 UNIT/ML SOPN Inject 0.15 mLs (15 Units total) into the skin at bedtime. 15 mL 3  . naproxen (NAPROSYN) 500 MG tablet Take 1 tablet (500 mg total) by mouth 2 (two) times daily with a meal. 14 tablet 0  . amoxicillin-clavulanate (AUGMENTIN) 875-125 MG tablet Take 1 tablet by mouth every  12 (twelve) hours. (Patient not taking: Reported on 07/04/2017) 14 tablet 0  . cyclobenzaprine (FLEXERIL) 10 MG tablet Take 1 tablet (10 mg total) by mouth at bedtime. (Patient not taking: Reported on 07/04/2017) 10 tablet 0  . cyclobenzaprine (FLEXERIL) 10 MG tablet Take 1 tablet (10 mg total) by mouth at bedtime. (Patient not taking: Reported on 07/04/2017) 10 tablet 0   No facility-administered medications prior to visit.     ROS Review of Systems  Constitutional: Negative for activity change and appetite change.  HENT: Positive for congestion. Negative for sinus  pressure and sore throat.   Eyes: Negative for visual disturbance.  Respiratory: Negative for cough, chest tightness and shortness of breath.   Cardiovascular: Negative for chest pain and leg swelling.  Gastrointestinal: Negative for abdominal distention, abdominal pain, constipation and diarrhea.  Endocrine: Negative.   Genitourinary: Negative for dysuria.  Musculoskeletal: Negative for joint swelling and myalgias.  Skin: Negative for rash.  Allergic/Immunologic: Negative.   Neurological: Negative for weakness, light-headedness and numbness.  Psychiatric/Behavioral: Negative for dysphoric mood and suicidal ideas.    Objective:  BP 134/79   Pulse 72   Temp 97.8 F (36.6 C) (Oral)   Ht 5\' 11"  (1.803 m)   Wt 201 lb 9.6 oz (91.4 kg)   SpO2 100%   BMI 28.12 kg/m   BP/Weight 07/04/2017 06/21/2017 03/13/2017  Systolic BP 134 138 119  Diastolic BP 79 83 84  Wt. (Lbs) 201.6 - 203.8  BMI 28.12 - 28.42       Physical Exam  Constitutional: He is oriented to person, place, and time. He appears well-developed and well-nourished.  Cardiovascular: Normal rate, normal heart sounds and intact distal pulses.  No murmur heard. Pulmonary/Chest: Effort normal and breath sounds normal. He has no wheezes. He has no rales. He exhibits no tenderness.  Abdominal: Soft. Bowel sounds are normal. He exhibits no distension and no mass. There is no tenderness.  Musculoskeletal: Normal range of motion.  Neurological: He is alert and oriented to person, place, and time.  Skin: Skin is warm and dry.  Psychiatric: He has a normal mood and affect.    CMP Latest Ref Rng & Units 03/15/2017 10/27/2016 04/28/2016  Glucose 65 - 99 mg/dL 161(W233(H) 96(E55(L) 66  BUN 6 - 24 mg/dL 18 14 17   Creatinine 0.76 - 1.27 mg/dL 4.541.19 0.98(J1.36(H) 1.911.19  Sodium 134 - 144 mmol/L 137 139 139  Potassium 3.5 - 5.2 mmol/L 4.5 4.2 3.9  Chloride 96 - 106 mmol/L 100 105 108  CO2 20 - 29 mmol/L 23 25 24   Calcium 8.7 - 10.2 mg/dL 9.9 9.5 9.2    Total Protein 6.0 - 8.5 g/dL 8.0 7.4 7.4  Total Bilirubin 0.0 - 1.2 mg/dL 0.5 0.4 0.5  Alkaline Phos 39 - 117 IU/L 82 69 55  AST 0 - 40 IU/L 18 16 19   ALT 0 - 44 IU/L 26 15 18     Lipid Panel     Component Value Date/Time   CHOL 191 03/15/2017 0924   TRIG 101 03/15/2017 0924   HDL 50 03/15/2017 0924   CHOLHDL 3.8 03/15/2017 0924   CHOLHDL 3.9 04/28/2016 1050   VLDL 21 04/28/2016 1050   LDLCALC 121 (H) 03/15/2017 0924    Lab Results  Component Value Date   HGBA1C 6.0 07/04/2017     Assessment & Plan:   1. Type 2 diabetes mellitus without complication, with long-term current use of insulin (HCC) A1c of 6.0 which has improved significantly from  8.3 previously Commended on improvement in glycemic control. No regimen change today; we have discussed management of hypoglycemia and he knows to reduce Lantus dose if this becomes an issue. Counseled on Diabetic diet, my plate method, 161 minutes of moderate intensity exercise/week Keep blood sugar logs with fasting goals of 80-120 mg/dl, random of less than 096 and in the event of sugars less than 60 mg/dl or greater than 045 mg/dl please notify the clinic ASAP. It is recommended that you undergo annual eye exams and annual foot exams. Pneumonia vaccine is recommended. - POCT glucose (manual entry) - POCT glycosylated hemoglobin (Hb A1C) - glipiZIDE (GLUCOTROL) 10 MG tablet; TAKE 1 TABLET(10 MG) BY MOUTH TWICE DAILY BEFORE A MEAL  Dispense: 180 tablet; Refill: 1 - Insulin Glargine (LANTUS SOLOSTAR) 100 UNIT/ML Solostar Pen; Inject 15 Units into the skin daily at 10 pm.  Dispense: 3 pen; Refill: 3  2. Essential hypertension, benign Controlled - amLODipine (NORVASC) 5 MG tablet; TAKE 1 TABLET(5 MG) BY MOUTH DAILY  Dispense: 90 tablet; Refill: 1  3. Pure hypercholesterolemia Uncontrolled with LDL is 121 which is greater than goal of less than 100 Low-cholesterol diet - atorvastatin (LIPITOR) 40 MG tablet; Take 1 tablet (40 mg  total) by mouth daily.  Dispense: 90 tablet; Refill: 1  4. Human immunodeficiency virus (HIV) disease (HCC) Continue antiretroviral therapy Management as per infectious disease  5. Other acute sinusitis, recurrence not specified Completed a course of Augmentin Placed on Zyrtec and advised to continue saline rinses Continue Flonase  6. Need for Tdap vaccination - Tdap vaccine greater than or equal to 7yo IM  7. Need for vaccination for Strep pneumoniae - Pneumococcal polysaccharide vaccine 23-valent greater than or equal to 2yo subcutaneous/IM   Meds ordered this encounter  Medications  . amLODipine (NORVASC) 5 MG tablet    Sig: TAKE 1 TABLET(5 MG) BY MOUTH DAILY    Dispense:  90 tablet    Refill:  1    **Patient requests 90 days supply**  . cetirizine (ZYRTEC) 10 MG tablet    Sig: Take 1 tablet (10 mg total) by mouth daily.    Dispense:  30 tablet    Refill:  1  . glipiZIDE (GLUCOTROL) 10 MG tablet    Sig: TAKE 1 TABLET(10 MG) BY MOUTH TWICE DAILY BEFORE A MEAL    Dispense:  180 tablet    Refill:  1  . atorvastatin (LIPITOR) 40 MG tablet    Sig: Take 1 tablet (40 mg total) by mouth daily.    Dispense:  90 tablet    Refill:  1  . Insulin Glargine (LANTUS SOLOSTAR) 100 UNIT/ML Solostar Pen    Sig: Inject 15 Units into the skin daily at 10 pm.    Dispense:  3 pen    Refill:  3    Follow-up: Return in about 3 months (around 10/01/2017) for Follow-up of chronic medical conditions.   Hoy Register MD

## 2017-07-16 ENCOUNTER — Other Ambulatory Visit: Payer: Self-pay | Admitting: Internal Medicine

## 2017-08-14 ENCOUNTER — Encounter (HOSPITAL_COMMUNITY): Payer: Self-pay | Admitting: Emergency Medicine

## 2017-08-14 ENCOUNTER — Ambulatory Visit (HOSPITAL_COMMUNITY)
Admission: EM | Admit: 2017-08-14 | Discharge: 2017-08-14 | Disposition: A | Discharge status: Home / Self Care | Payer: Medicare Other | Attending: Urgent Care | Admitting: Urgent Care

## 2017-08-14 DIAGNOSIS — Z794 Long term (current) use of insulin: Secondary | ICD-10-CM

## 2017-08-14 DIAGNOSIS — Z21 Asymptomatic human immunodeficiency virus [HIV] infection status: Secondary | ICD-10-CM

## 2017-08-14 DIAGNOSIS — E119 Type 2 diabetes mellitus without complications: Secondary | ICD-10-CM

## 2017-08-14 DIAGNOSIS — G44209 Tension-type headache, unspecified, not intractable: Secondary | ICD-10-CM

## 2017-08-14 DIAGNOSIS — I10 Essential (primary) hypertension: Secondary | ICD-10-CM

## 2017-08-14 DIAGNOSIS — B20 Human immunodeficiency virus [HIV] disease: Secondary | ICD-10-CM

## 2017-08-14 MED ORDER — KETOROLAC TROMETHAMINE 30 MG/ML IJ SOLN
30.0000 mg | Freq: Once | INTRAMUSCULAR | Status: AC
  Administered 2017-08-14: 30 mg via INTRAMUSCULAR

## 2017-08-14 MED ORDER — KETOROLAC TROMETHAMINE 30 MG/ML IJ SOLN
INTRAMUSCULAR | Status: AC
  Filled 2017-08-14: qty 1

## 2017-08-14 NOTE — ED Provider Notes (Signed)
MRN: [id number] DOB: 1961/03/07  Subjective:   Benjamin Hunter is a 57 y.o. male presenting for 5 day history of constant, throbbing headache over superior aspect of head. Has a history allergies, managed with Zyrtec. Has also tried aspirin and otc pain relief medications. Denies fever, confusion, dizziness, photophobia, sinus congestion, sinus pain, ear pain, ear drainage, sore throat, cough, n/v, abdominal pain. Has had some constipation. Patient is on HIV medication. Patient does not hydrate well, does up to 24 ounces daily. Diet is non-compliant with diabetes.   No current facility-administered medications for this encounter.   Current Outpatient Medications:  .  ACCU-CHEK SOFTCLIX LANCETS lancets, Use as instructed for 3 times daily testing of blood sugar, Disp: 100 each, Rfl: 12 .  Alcohol Swabs PADS, 1 each by Does not apply route 3 (three) times daily before meals., Disp: 90 each, Rfl: 11 .  amLODipine (NORVASC) 5 MG tablet, TAKE 1 TABLET(5 MG) BY MOUTH DAILY, Disp: 90 tablet, Rfl: 1 .  atorvastatin (LIPITOR) 40 MG tablet, Take 1 tablet (40 mg total) by mouth daily., Disp: 90 tablet, Rfl: 1 .  B-D UF III MINI PEN NEEDLES 31G X 5 MM MISC, INJECT AT BEDTIME, Disp: 100 each, Rfl: 2 .  bictegravir-emtricitabine-tenofovir AF (BIKTARVY) 50-200-25 MG TABS tablet, Take 1 tablet by mouth daily., Disp: 30 tablet, Rfl: 5 .  cetirizine (ZYRTEC) 10 MG tablet, Take 1 tablet (10 mg total) by mouth daily., Disp: 30 tablet, Rfl: 1 .  ezetimibe (ZETIA) 10 MG tablet, TAKE 1 TABLET(10 MG) BY MOUTH DAILY, Disp: 90 tablet, Rfl: 1 .  fenofibrate (TRICOR) 48 MG tablet, TAKE 1 TABLET(48 MG) BY MOUTH DAILY, Disp: 90 tablet, Rfl: 1 .  fluticasone (FLONASE) 50 MCG/ACT nasal spray, Place 2 sprays into both nostrils daily., Disp: 16 g, Rfl: 3 .  glipiZIDE (GLUCOTROL) 10 MG tablet, TAKE 1 TABLET(10 MG) BY MOUTH TWICE DAILY BEFORE A MEAL, Disp: 180 tablet, Rfl: 1 .  glucose blood (ACCU-CHEK AVIVA PLUS) test strip, Use  as directed for 3 times daily testing of blood sugar. E11.9, Disp: 300 each, Rfl: 3 .  glucose blood (ACCU-CHEK AVIVA) test strip, Use as instructed for 3 times daily testing of blood sugar, Disp: 100 each, Rfl: 12 .  Insulin Glargine (LANTUS SOLOSTAR) 100 UNIT/ML Solostar Pen, Inject 15 Units into the skin daily at 10 pm., Disp: 3 pen, Rfl: 3 .  Lancet Devices (ACCU-CHEK SOFTCLIX) lancets, Use as instructed for 3 times daily testing of blood sugar., Disp: 1 each, Rfl: 0   Benjamin Hunter is allergic to shrimp [shellfish allergy] and lisinopril.  Benjamin Hunter  has a past medical history of Arthritis, Depression, Diabetes mellitus, HIV (human immunodeficiency virus infection) (HCC), Hyperlipidemia, and Hypertension. Also  has no past surgical history on file.  Objective:   Vitals: BP (!) 143/90 (BP Location: Right Arm)   Pulse 80   Temp 98.4 F (36.9 C) (Oral)   Resp 18   SpO2 100%   BP Readings from Last 3 Encounters:  08/14/17 (!) 143/90  07/04/17 134/79  06/21/17 138/83    Physical Exam  Constitutional: He is oriented to person, place, and time. He appears well-developed and well-nourished.  HENT:  Right Ear: Tympanic membrane normal.  Left Ear: Tympanic membrane normal.  Nose: No sinus tenderness.  Mouth/Throat: Oropharynx is clear and moist.  Eyes: EOM are normal. Pupils are equal, round, and reactive to light. Right eye exhibits no discharge. Left eye exhibits no discharge.  Neck: Normal range of motion. Neck supple.  Cardiovascular: Normal rate, regular rhythm and intact distal pulses. Exam reveals no gallop and no friction rub.  No murmur heard. Pulmonary/Chest: No respiratory distress. He has no wheezes. He has no rales.  Musculoskeletal: He exhibits no edema.  Lymphadenopathy:    He has no cervical adenopathy.  Neurological: He is alert and oriented to person, place, and time. He displays normal reflexes. No cranial nerve deficit. Coordination normal.  Skin: Skin is warm and dry. No  rash noted.  Psychiatric: He has a normal mood and affect.   Assessment and Plan :   Acute non intractable tension-type headache  Controlled type 2 diabetes mellitus without complication, with long-term current use of insulin (HCC)  HIV disease (HCC)  Essential hypertension  Recommend lifestyle modifications including better hydration, diabetes dietary compliance. Provided patient with Toradol injection in clinic. Return-to-clinic precautions discussed, patient verbalized understanding.    Wallis Bamberg, [place] 08/14/17 2238

## 2017-08-14 NOTE — Discharge Instructions (Signed)
Hydrate well with at least 2 liters (1 gallon) of water daily. You may take 500mg Tylenol with ibuprofen 400-600mg every 6 hours for pain and inflammation.  °

## 2017-08-14 NOTE — ED Triage Notes (Signed)
Pt here for HA in top of head x 5 days

## 2017-09-05 ENCOUNTER — Encounter (HOSPITAL_COMMUNITY): Payer: Self-pay | Admitting: Emergency Medicine

## 2017-09-05 ENCOUNTER — Ambulatory Visit (HOSPITAL_COMMUNITY)
Admission: EM | Admit: 2017-09-05 | Discharge: 2017-09-05 | Disposition: A | Discharge status: Home / Self Care | Payer: Medicare Other | Attending: Family Medicine | Admitting: Family Medicine

## 2017-09-05 DIAGNOSIS — J019 Acute sinusitis, unspecified: Secondary | ICD-10-CM

## 2017-09-05 MED ORDER — AMOXICILLIN-POT CLAVULANATE 875-125 MG PO TABS: 1.0000 | ORAL_TABLET | Freq: Two times a day (BID) | ORAL | 0 refills | Status: AC

## 2017-09-05 NOTE — Discharge Instructions (Signed)
Please begin Augmentin twice daily for the next 10 days.  Please continue Zyrtec and Flonase for congestion, you may also continue the combination medicine that you have.  Please take Tylenol and ibuprofen for any headaches, body aches and to help with inflammation of the sinuses.  If your symptoms persist despite antibiotic, these symptoms may be more so related to allergies versus a sinus infection.

## 2017-09-05 NOTE — ED Triage Notes (Signed)
Pt c/o nasal congestion x1 month

## 2017-09-05 NOTE — ED Provider Notes (Signed)
[facility]    CSN: [id number] Arrival date & time: 09/05/17  1541     History   Chief Complaint Chief Complaint  Patient presents with  . Nasal Congestion    HPI Benjamin Hunter is a 56 y.o. male presenting today with nasal congestion.  States that he has had a lot of nasal drainage for the last 2 weeks.  He has been taking Zyrtec, Flonase as well as DayQuil's.  He previously had a sore throat, but this is resolved.  He has had occasional cough trying to cough up the phlegm.  Denies fevers.  Denies history of asthma or smoking.  HPI  Past Medical History:  Diagnosis Date  . Arthritis   . Depression   . Diabetes mellitus   . HIV (human immunodeficiency virus infection) (HCC)   . Hyperlipidemia   . Hypertension     Patient Active Problem List   Diagnosis Date Noted  . 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 Depression 01/14/2013  . Rash and nonspecific skin eruption 10/11/2012  . Recurrent low back pain 10/11/2012  . Left facial numbness 08/20/2012  . Seasonal allergies 09/20/2011  . Polyarthritis 09/06/2011  . Diabetes mellitus (HCC) 03/16/2009  . Human immunodeficiency virus (HIV) disease (HCC) 07/17/2008  . Hyperlipidemia 07/17/2008  . Hypertension 07/17/2008    History reviewed. No pertinent surgical history.     Home Medications    Prior to Admission medications   Medication Sig Start Date End Date Taking? Authorizing Provider  ACCU-CHEK SOFTCLIX LANCETS lancets Use as instructed for 3 times daily testing of blood sugar 12/07/15   Hoy Register, MD  Alcohol Swabs PADS 1 each by Does not apply route 3 (three) times daily before meals. 11/16/16   Hoy Register, MD  amLODipine (NORVASC) 5 MG tablet TAKE 1 TABLET(5 MG) BY MOUTH DAILY 07/04/17   Hoy Register, MD  amoxicillin-clavulanate (AUGMENTIN) 875-125 MG tablet Take 1 tablet by mouth 2 (two) times daily for 10 days. 09/05/17 09/15/17  Wieters, Hallie C, PA-C    atorvastatin (LIPITOR) 40 MG tablet Take 1 tablet (40 mg total) by mouth daily. 07/04/17   Hoy Register, MD  B-D UF III MINI PEN NEEDLES 31G X 5 MM MISC INJECT AT BEDTIME 04/18/17   Hoy Register, MD  bictegravir-emtricitabine-tenofovir AF (BIKTARVY) 50-200-25 MG TABS tablet Take 1 tablet by mouth daily. 05/24/17   [name], MD  cetirizine (ZYRTEC) 10 MG tablet Take 1 tablet (10 mg total) by mouth daily. 07/04/17   Hoy Register, MD  ezetimibe (ZETIA) 10 MG tablet TAKE 1 TABLET(10 MG) BY MOUTH DAILY 03/13/17   Hoy Register, MD  fenofibrate (TRICOR) 48 MG tablet TAKE 1 TABLET(48 MG) BY MOUTH DAILY 03/13/17   Hoy Register, MD  fluticasone (FLONASE) 50 MCG/ACT nasal spray Place 2 sprays into both nostrils daily. 03/13/17   Hoy Register, MD  glipiZIDE (GLUCOTROL) 10 MG tablet TAKE 1 TABLET(10 MG) BY MOUTH TWICE DAILY BEFORE A MEAL 07/04/17   Newlin, Enobong, MD  glucose blood (ACCU-CHEK AVIVA PLUS) test strip Use as directed for 3 times daily testing of blood sugar. E11.9 12/30/16   Hoy Register, MD  glucose blood (ACCU-CHEK AVIVA) test strip Use as instructed for 3 times daily testing of blood sugar 12/07/15   Hoy Register, MD  Insulin Glargine (LANTUS SOLOSTAR) 100 UNIT/ML Solostar Pen Inject 15 Units into the skin daily at 10 pm. 07/04/17   Hoy Register, MD  Lancet Devices [facility]) lancets Use as  instructed for 3 times daily testing of blood sugar. 12/07/15   Hoy Register, MD    Family History Family History  Problem Relation Age of Onset  . Hypertension Mother     Social History Social History   Tobacco Use  . Smoking status: Never Smoker  . Smokeless tobacco: Never Used  Substance Use Topics  . Alcohol use: Yes    Alcohol/week: 0.6 - 1.2 oz    Types: 1 - 2 Cans of beer per week    Comment: occasional   . Drug use: No     Allergies   Shrimp [shellfish allergy] and Lisinopril   Review of Systems Review of Systems  Constitutional: Negative  for activity change, appetite change, fatigue and fever.  HENT: Positive for congestion, postnasal drip and rhinorrhea. Negative for ear pain, sinus pressure and sore throat.   Eyes: Negative for pain and itching.  Respiratory: Positive for cough. Negative for shortness of breath.   Cardiovascular: Negative for chest pain.  Gastrointestinal: Negative for abdominal pain, diarrhea, nausea and vomiting.  Musculoskeletal: Negative for myalgias.  Skin: Negative for rash.  Neurological: Negative for dizziness, light-headedness and headaches.     Physical Exam Triage Vital Signs ED Triage Vitals  Enc Vitals Group     BP 09/05/17 1557 107/68     Pulse Rate 09/05/17 1557 85     Resp 09/05/17 1557 18     Temp 09/05/17 1557 98.2 F (36.8 C)     Temp src --      SpO2 09/05/17 1557 100 %     Weight --      Height --      Head Circumference --      Peak Flow --      Pain Score 09/05/17 1558 0     Pain Loc --      Pain Edu? --      Excl. in GC? --    No data found.  Updated Vital Signs BP 107/68   Pulse 85   Temp 98.2 F (36.8 C)   Resp 18   SpO2 100%   Visual Acuity Right Eye Distance:   Left Eye Distance:   Bilateral Distance:    Right Eye Near:   Left Eye Near:    Bilateral Near:     Physical Exam  Constitutional: He appears well-developed and well-nourished.  HENT:  Head: Normocephalic and atraumatic.  Bilateral TMs nonerythematous, nasal mucosa erythematous with swollen turbinates, posterior oropharynx nonerythematous, no tonsillar enlargement or exudate.  Eyes: Conjunctivae are normal.  Neck: Neck supple.  Cardiovascular: Normal rate and regular rhythm.  No murmur heard. Pulmonary/Chest: Effort normal and breath sounds normal. No respiratory distress.  Breathing audibly at rest, CTA BL  Abdominal: Soft. There is no tenderness.  Musculoskeletal: He exhibits no edema.  Neurological: He is alert.  Skin: Skin is warm and dry.  Psychiatric: He has a normal mood and  affect.  Nursing note and vitals reviewed.    UC Treatments / Results  Labs (all labs ordered are listed, but only abnormal results are displayed) Labs Reviewed - No data to display  EKG None Radiology No results found.  Procedures Procedures (including critical care time)  Medications Ordered in UC Medications - No data to display   Initial Impression / Assessment and Plan / UC Course  I have reviewed the triage vital signs and the nursing notes.  Pertinent labs & imaging results that were available during my care of the patient  were reviewed by me and considered in my medical decision making (see chart for details).     Patient with URI symptoms for approximately 2 weeks, we will treat for bacterial sinusitis with Augmentin.  Also advised patient that if symptoms persist despite antibiotic use they may be related to allergic rhinitis.  Lungs clear to auscultation, advised patient to return if symptoms worsening. Discussed strict return precautions. Patient verbalized understanding and is agreeable with plan.   Final Clinical Impressions(s) / UC Diagnoses   Final diagnoses:  Acute sinusitis with symptoms > 10 days    ED Discharge Orders        Ordered    amoxicillin-clavulanate (AUGMENTIN) 875-125 MG tablet  2 times daily     09/05/17 1637       Controlled Substance Prescriptions [place] Controlled Substance Registry consulted? Not Applicable   Lew DawesWieters, Hallie C, [place]PA-C 09/05/17 1642

## 2017-10-09 ENCOUNTER — Other Ambulatory Visit: Payer: Self-pay | Admitting: Family Medicine

## 2017-10-09 DIAGNOSIS — E7800 Pure hypercholesterolemia, unspecified: Secondary | ICD-10-CM

## 2017-11-02 ENCOUNTER — Ambulatory Visit: Payer: Medicare Other | Admitting: Family Medicine

## 2017-11-03 ENCOUNTER — Ambulatory Visit: Payer: Medicare Other | Admitting: Family Medicine

## 2017-11-17 ENCOUNTER — Other Ambulatory Visit: Payer: Self-pay | Admitting: Behavioral Health

## 2017-11-17 DIAGNOSIS — B20 Human immunodeficiency virus [HIV] disease: Secondary | ICD-10-CM

## 2017-11-17 MED ORDER — BICTEGRAVIR-EMTRICITAB-TENOFOV 50-200-25 MG PO TABS: 1.0000 | ORAL_TABLET | Freq: Every day | ORAL | 1 refills | Status: DC

## 2017-11-29 ENCOUNTER — Other Ambulatory Visit: Payer: Medicare Other

## 2017-11-29 DIAGNOSIS — B20 Human immunodeficiency virus [HIV] disease: Secondary | ICD-10-CM

## 2017-12-01 LAB — CBC
HEMATOCRIT: 35.4 % — AB (ref 38.5–50.0)
HEMOGLOBIN: 11.8 g/dL — AB (ref 13.2–17.1)
MCH: 29.8 pg (ref 27.0–33.0)
MCHC: 33.3 g/dL (ref 32.0–36.0)
MCV: 89.4 fL (ref 80.0–100.0)
MPV: 11.8 fL (ref 7.5–12.5)
Platelets: 169 10*3/uL (ref 140–400)
RBC: 3.96 10*6/uL — ABNORMAL LOW (ref 4.20–5.80)
RDW: 11.7 % (ref 11.0–15.0)
WBC: 4.4 10*3/uL (ref 3.8–10.8)

## 2017-12-01 LAB — COMPREHENSIVE METABOLIC PANEL
AG RATIO: 1.3 (calc) (ref 1.0–2.5)
ALT: 16 U/L (ref 9–46)
AST: 20 U/L (ref 10–35)
Albumin: 3.9 g/dL (ref 3.6–5.1)
Alkaline phosphatase (APISO): 68 U/L (ref 40–115)
BILIRUBIN TOTAL: 0.8 mg/dL (ref 0.2–1.2)
BUN: 13 mg/dL (ref 7–25)
CALCIUM: 9.3 mg/dL (ref 8.6–10.3)
CO2: 25 mmol/L (ref 20–32)
Chloride: 105 mmol/L (ref 98–110)
Creat: 1.18 mg/dL (ref 0.70–1.33)
Globulin: 3.1 g/dL (calc) (ref 1.9–3.7)
Glucose, Bld: 286 mg/dL — ABNORMAL HIGH (ref 65–99)
POTASSIUM: 4.1 mmol/L (ref 3.5–5.3)
SODIUM: 137 mmol/L (ref 135–146)
TOTAL PROTEIN: 7 g/dL (ref 6.1–8.1)

## 2017-12-01 LAB — T-HELPER CELL (CD4) - (RCID CLINIC ONLY)
CD4 % Helper T Cell: 37 % (ref 33–55)
CD4 T Cell Abs: 540 /uL (ref 400–2700)

## 2017-12-01 LAB — RPR: RPR Ser Ql: NONREACTIVE

## 2017-12-04 LAB — HIV-1 RNA QUANT-NO REFLEX-BLD
HIV 1 RNA QUANT: DETECTED {copies}/mL — AB
HIV-1 RNA QUANT, LOG: DETECTED {Log_copies}/mL — AB

## 2017-12-12 ENCOUNTER — Encounter: Payer: Self-pay | Admitting: Internal Medicine

## 2017-12-12 ENCOUNTER — Ambulatory Visit (INDEPENDENT_AMBULATORY_CARE_PROVIDER_SITE_OTHER): Payer: Medicare Other | Admitting: Internal Medicine

## 2017-12-12 DIAGNOSIS — M130 Polyarthritis, unspecified: Secondary | ICD-10-CM | POA: Diagnosis not present

## 2017-12-12 DIAGNOSIS — B20 Human immunodeficiency virus [HIV] disease: Secondary | ICD-10-CM

## 2017-12-12 DIAGNOSIS — E119 Type 2 diabetes mellitus without complications: Secondary | ICD-10-CM

## 2017-12-12 DIAGNOSIS — F329 Major depressive disorder, single episode, unspecified: Secondary | ICD-10-CM | POA: Diagnosis not present

## 2017-12-12 DIAGNOSIS — Z794 Long term (current) use of insulin: Secondary | ICD-10-CM | POA: Diagnosis not present

## 2017-12-12 NOTE — Assessment & Plan Note (Signed)
He is working hard to maintain good control of his diabetes.

## 2017-12-12 NOTE — Progress Notes (Signed)
;          Patient Active Problem List   Diagnosis Date Noted  . Human immunodeficiency virus (HIV) disease (HCC) 07/17/2008    Priority: High  . 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 Seasonal allergies 09/20/2011    Priority: Medium  . Polyarthritis 09/06/2011    Priority: Medium  . Diabetes mellitus (HCC) 03/16/2009    Priority: Medium  . Hyperlipidemia 07/17/2008    Priority: Medium  . Hypertension 07/17/2008    Priority: Medium  . 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 Depression 01/14/2013  . Rash and nonspecific skin eruption 10/11/2012  . Recurrent low back pain 10/11/2012  . Left facial numbness 08/20/2012    Patient's Medications  New Prescriptions   No medications on file  Previous Medications   ACCU-CHEK SOFTCLIX LANCETS LANCETS    Use as instructed for 3 times daily testing of blood sugar   ALCOHOL SWABS PADS    1 each by Does not apply route 3 (three) times daily before meals.   AMLODIPINE (NORVASC) 5 MG TABLET    TAKE 1 TABLET(5 MG) BY MOUTH DAILY   ATORVASTATIN (LIPITOR) 40 MG TABLET    Take 1 tablet (40 mg total) by mouth daily.   B-D UF III MINI PEN NEEDLES 31G X 5 MM MISC    INJECT AT BEDTIME   BICTEGRAVIR-EMTRICITABINE-TENOFOVIR AF (BIKTARVY) 50-200-25 MG TABS TABLET    Take 1 tablet by mouth daily.   CETIRIZINE (ZYRTEC) 10 MG TABLET    TAKE 1 TABLET(10 MG) BY MOUTH DAILY   EZETIMIBE (ZETIA) 10 MG TABLET    TAKE 1 TABLET(10 MG) BY MOUTH DAILY   FENOFIBRATE (TRICOR) 48 MG TABLET    TAKE 1 TABLET(48 MG) BY MOUTH DAILY   FLUTICASONE (FLONASE) 50 MCG/ACT NASAL SPRAY    Place 2 sprays into both nostrils daily.   GLIPIZIDE (GLUCOTROL) 10 MG TABLET    TAKE 1 TABLET(10 MG) BY MOUTH TWICE DAILY BEFORE A MEAL   GLUCOSE BLOOD (ACCU-CHEK AVIVA PLUS) TEST STRIP    Use as directed for 3 times daily testing of blood sugar. E11.9   GLUCOSE BLOOD (ACCU-CHEK AVIVA) TEST STRIP    Use as instructed for 3 times daily testing of blood sugar   INSULIN GLARGINE  (LANTUS SOLOSTAR) 100 UNIT/ML SOLOSTAR PEN    Inject 15 Units into the skin daily at 10 pm.   LANCET DEVICES (ACCU-CHEK SOFTCLIX) LANCETS    Use as instructed for 3 times daily testing of blood sugar.  Modified Medications   No medications on file  Discontinued Medications   No medications on file    Subjective: Benjamin Hunter is in for his routine HIV follow-up visit.  He has had no problems obtaining, taking or tolerating his Biktarvy.  He is only missed one dose since his last visit.  This occurred when he got away from home and forgot to take it in the morning.  He realized several hours later but did not take it when he got back home.  He says his blood sugars are running around 200.  His last hemoglobin A1c was 6 in February.  He has questions about whether he should undergo colonoscopy and prostate cancer screening.  He is not having any significant problems with his polyarthritis.  He says that he will take ibuprofen occasionally for pain and stiffness in his hands.  Review of Systems: Review of Systems  Constitutional: Positive for weight loss. Negative for chills, diaphoresis, fever and  malaise/fatigue.       He has been trying to lose weight.  He has been walking more and works occasionally mowing lawns.  HENT: Negative for sore throat.   Respiratory: Negative for cough, sputum production and shortness of breath.   Cardiovascular: Negative for chest pain.  Gastrointestinal: Negative for abdominal pain, diarrhea, heartburn, nausea and vomiting.  Genitourinary: Negative for dysuria and frequency.  Musculoskeletal: Positive for joint pain. Negative for myalgias.  Skin: Negative for rash.  Neurological: Negative for dizziness and headaches.  Psychiatric/Behavioral: Negative for depression and substance abuse. The patient is not nervous/anxious.     Past Medical History:  Diagnosis Date  . Arthritis   . Depression   . Diabetes mellitus   . HIV (human immunodeficiency virus infection)  (HCC)   . Hyperlipidemia   . Hypertension     Social History   Tobacco Use  . Smoking status: Never Smoker  . Smokeless tobacco: Never Used  Substance Use Topics  . Alcohol use: Yes    Alcohol/week: 0.6 - 1.2 oz    Types: 1 - 2 Cans of beer per week    Comment: occasional   . Drug use: No    Family History  Problem Relation Age of Onset  . Hypertension Mother     Allergies  Allergen Reactions  . Shrimp [Shellfish Allergy] Nausea And Vomiting  . Lisinopril Rash    Palmar rash     Health Maintenance  Topic Date Due  . COLONOSCOPY  11/25/2009  . OPHTHALMOLOGY EXAM  04/13/2016  . FOOT EXAM  06/20/2017  . INFLUENZA VACCINE  12/28/2017  . HEMOGLOBIN A1C  01/01/2018  . URINE MICROALBUMIN  03/15/2018  . TETANUS/TDAP  07/05/2027  . PNEUMOCOCCAL POLYSACCHARIDE VACCINE  Completed  . Hepatitis C Screening  Completed  . HIV Screening  Completed    Objective:  Vitals:   12/12/17 1037  BP: 121/78  Pulse: 76  Temp: 98.1 F (36.7 C)  TempSrc: Oral  Weight: 194 lb (88 kg)  Height: 5\' 11"  (1.803 m)   Body mass index is 27.06 kg/m.  Physical Exam  Constitutional: He is oriented to person, place, and time.  His weight is down 8 pounds.  He is in good spirits as usual.  HENT:  Mouth/Throat: No oropharyngeal exudate.  Eyes: Conjunctivae are normal.  Cardiovascular: Normal rate, regular rhythm and normal heart sounds.  No murmur heard. Pulmonary/Chest: Effort normal and breath sounds normal.  Abdominal: Soft. He exhibits no mass. There is no tenderness.  Musculoskeletal: Normal range of motion.  Neurological: He is alert and oriented to person, place, and time.  Skin: No rash noted.  Psychiatric: He has a normal mood and affect.    Lab Results Lab Results  Component Value Date   WBC 4.4 11/29/2017   HGB 11.8 (L) 11/29/2017   HCT 35.4 (L) 11/29/2017   MCV 89.4 11/29/2017   PLT 169 11/29/2017    Lab Results  Component Value Date   CREATININE 1.18  11/29/2017   BUN 13 11/29/2017   NA 137 11/29/2017   K 4.1 11/29/2017   CL 105 11/29/2017   CO2 25 11/29/2017    Lab Results  Component Value Date   ALT 16 11/29/2017   AST 20 11/29/2017   ALKPHOS 82 03/15/2017   BILITOT 0.8 11/29/2017    Lab Results  Component Value Date   CHOL 191 03/15/2017   HDL 50 03/15/2017   LDLCALC 121 (H) 03/15/2017   TRIG 101 03/15/2017  CHOLHDL 3.8 03/15/2017   Lab Results  Component Value Date   LABRPR NON-REACTIVE 11/29/2017   HIV 1 RNA Quant (copies/mL)  Date Value  11/29/2017 <20 DETECTED (A)  10/27/2016 <20 NOT DETECTED  04/28/2016 <20   CD4 T Cell Abs (/uL)  Date Value  11/29/2017 540  10/27/2016 720  04/28/2016 660     Problem List Items Addressed This Visit      High   Human immunodeficiency virus (HIV) disease (HCC)    His infection remains under excellent long-term control.  He will continue Biktarvy.  I have given him a small pill canister so he can have 1 or 2 doses with him when he is away from home.  Encouraged him to try and never to miss a dose.  He would like to follow-up after lab work in 6 months.      Relevant Orders   T-helper cell (CD4)- ([facility] clinic only)   HIV 1 RNA quant-no reflex-bld   CBC   Comprehensive metabolic panel   RPR     Medium   Diabetes mellitus (HCC)    He is working hard to maintain good control of his diabetes.      Polyarthritis    His polyarthritis is much less active than when first diagnosed several years ago.        Unprioritized   Depression    His depression is in remission.           [name], MD [facility] [phone] pager   [phone] cell 12/12/2017, 11:00 AM

## 2017-12-12 NOTE — Assessment & Plan Note (Signed)
His infection remains under excellent long-term control.  He will continue Biktarvy.  I have given him a small pill canister so he can have 1 or 2 doses with him when he is away from home.  Encouraged him to try and never to miss a dose.  He would like to follow-up after lab work in 6 months.

## 2017-12-12 NOTE — Assessment & Plan Note (Signed)
His polyarthritis is much less active than when first diagnosed several years ago.

## 2017-12-12 NOTE — Assessment & Plan Note (Signed)
His depression is in remission. 

## 2017-12-19 ENCOUNTER — Encounter: Payer: Self-pay | Admitting: Family Medicine

## 2017-12-19 ENCOUNTER — Ambulatory Visit: Payer: Medicare Other | Attending: Family Medicine | Admitting: Family Medicine

## 2017-12-19 VITALS — BP 126/77 | HR 61 | Temp 97.7°F | Ht 71.0 in | Wt 196.2 lb

## 2017-12-19 DIAGNOSIS — B20 Human immunodeficiency virus [HIV] disease: Secondary | ICD-10-CM | POA: Diagnosis not present

## 2017-12-19 DIAGNOSIS — I10 Essential (primary) hypertension: Secondary | ICD-10-CM | POA: Insufficient documentation

## 2017-12-19 DIAGNOSIS — Z1211 Encounter for screening for malignant neoplasm of colon: Secondary | ICD-10-CM

## 2017-12-19 DIAGNOSIS — F329 Major depressive disorder, single episode, unspecified: Secondary | ICD-10-CM | POA: Diagnosis not present

## 2017-12-19 DIAGNOSIS — E119 Type 2 diabetes mellitus without complications: Secondary | ICD-10-CM | POA: Insufficient documentation

## 2017-12-19 DIAGNOSIS — Z888 Allergy status to other drugs, medicaments and biological substances status: Secondary | ICD-10-CM | POA: Diagnosis not present

## 2017-12-19 DIAGNOSIS — Z794 Long term (current) use of insulin: Secondary | ICD-10-CM | POA: Insufficient documentation

## 2017-12-19 DIAGNOSIS — Z79899 Other long term (current) drug therapy: Secondary | ICD-10-CM | POA: Diagnosis not present

## 2017-12-19 DIAGNOSIS — E7800 Pure hypercholesterolemia, unspecified: Secondary | ICD-10-CM | POA: Insufficient documentation

## 2017-12-19 LAB — GLUCOSE, POCT (MANUAL RESULT ENTRY): POC Glucose: 131 mg/dl — AB (ref 70–99)

## 2017-12-19 NOTE — Progress Notes (Signed)
Subjective:  Patient ID: Benjamin Hunter, male    DOB: Jun 26, 1960  Age: 57 y.o. MRN: [id number]  CC: Diabetes   HPI Benjamin Hunter  is a 57 year old male with a history of hypertension, type 2 diabetes mellitus (A1c 6.0), HIV (on antiretrovirals therapy, undetectable viral load) who comes into the clinic for a follow-up visit. He reports not being as compliant over the last few months due to a part-time job that had him eating a lot of fast foods but he is promising to turn a new leaf.  He has lost a few pounds and has been physically active.  Denies numbness in extremities.  Last eye exam was 2 years ago with an ophthalmologist on [employer] but he is yet to make an appointment for annual eye exams due to financial constraints at this time. Seen by his infectious disease specialist earlier this month and thought to be stable with follow-up in 6 months recommended. He tolerates his statins on his antihypertensive and denies chest pains, shortness of breath or pedal edema. He is not ready for colonoscopy but is willing to undergo the stool FIT test.  Past Medical History:  Diagnosis Date  . Arthritis   . Depression   . Diabetes mellitus   . HIV (human immunodeficiency virus infection) ([place])   . Hyperlipidemia   . Hypertension     No past surgical history on file.  Allergies  Allergen Reactions  . Shrimp [Shellfish Allergy] Nausea And Vomiting  . Lisinopril Rash    Palmar rash      Outpatient Medications Prior to Visit  Medication Sig Dispense Refill  . ACCU-CHEK SOFTCLIX LANCETS lancets Use as instructed for 3 times daily testing of blood sugar 100 each 12  . Alcohol Swabs PADS 1 each by Does not apply route 3 (three) times daily before meals. 90 each 11  . amLODipine (NORVASC) 5 MG tablet TAKE 1 TABLET(5 MG) BY MOUTH DAILY 90 tablet 1  . atorvastatin (LIPITOR) 40 MG tablet Take 1 tablet (40 mg total) by mouth daily. 90 tablet 1  . B-D UF III MINI PEN NEEDLES 31G X 5  MM MISC INJECT AT BEDTIME 100 each 2  . bictegravir-emtricitabine-tenofovir AF (BIKTARVY) 50-200-25 MG TABS tablet Take 1 tablet by mouth daily. 30 tablet 1  . cetirizine (ZYRTEC) 10 MG tablet TAKE 1 TABLET(10 MG) BY MOUTH DAILY 30 tablet 0  . ezetimibe (ZETIA) 10 MG tablet TAKE 1 TABLET(10 MG) BY MOUTH DAILY 90 tablet 1  . fenofibrate (TRICOR) 48 MG tablet TAKE 1 TABLET(48 MG) BY MOUTH DAILY 90 tablet 0  . glipiZIDE (GLUCOTROL) 10 MG tablet TAKE 1 TABLET(10 MG) BY MOUTH TWICE DAILY BEFORE A MEAL 180 tablet 1  . glucose blood (ACCU-CHEK AVIVA PLUS) test strip Use as directed for 3 times daily testing of blood sugar. E11.9 300 each 3  . glucose blood (ACCU-CHEK AVIVA) test strip Use as instructed for 3 times daily testing of blood sugar 100 each 12  . Insulin Glargine (LANTUS SOLOSTAR) 100 UNIT/ML Solostar Pen Inject 15 Units into the skin daily at 10 pm. 3 pen 3  . Lancet Devices (ACCU-CHEK SOFTCLIX) lancets Use as instructed for 3 times daily testing of blood sugar. 1 each 0  . fluticasone (FLONASE) 50 MCG/ACT nasal spray Place 2 sprays into both nostrils daily. (Patient not taking: Reported on 12/19/2017) 16 g 3   No facility-administered medications prior to visit.     ROS Review of Systems  Constitutional: Negative for activity  change and appetite change.  HENT: Negative for sinus pressure and sore throat.   Eyes: Negative for visual disturbance.  Respiratory: Negative for cough, chest tightness and shortness of breath.   Cardiovascular: Negative for chest pain and leg swelling.  Gastrointestinal: Negative for abdominal distention, abdominal pain, constipation and diarrhea.  Endocrine: Negative.   Genitourinary: Negative for dysuria.  Musculoskeletal: Negative for joint swelling and myalgias.  Skin: Negative for rash.  Allergic/Immunologic: Negative.   Neurological: Negative for weakness, light-headedness and numbness.  Psychiatric/Behavioral: Negative for dysphoric mood and suicidal  ideas.    Objective:  BP 126/77   Pulse 61   Temp 97.7 F (36.5 C) (Oral)   Ht '5\' 11"'  (1.803 m)   Wt 196 lb 3.2 oz (89 kg)   SpO2 98%   BMI 27.36 kg/m   BP/Weight 12/19/2017 12/22/2033 10/06/7414  Systolic BP 384 536 468  Diastolic BP 77 78 68  Wt. (Lbs) 196.2 194 -  BMI 27.36 27.06 -      Physical Exam  Constitutional: He is oriented to person, place, and time. He appears well-developed and well-nourished.  Cardiovascular: Normal rate, normal heart sounds and intact distal pulses.  No murmur heard. Pulmonary/Chest: Effort normal and breath sounds normal. He has no wheezes. He has no rales. He exhibits no tenderness.  Abdominal: Soft. Bowel sounds are normal. He exhibits no distension and no mass. There is no tenderness.  Musculoskeletal: Normal range of motion.  Neurological: He is alert and oriented to person, place, and time.  Skin: Skin is warm and dry.  Psychiatric: He has a normal mood and affect.    CMP Latest Ref Rng & Units 11/29/2017 03/15/2017 10/27/2016  Glucose 65 - 99 mg/dL 286(H) 233(H) 55(L)  BUN 7 - 25 mg/dL '13 18 14  ' Creatinine 0.70 - 1.33 mg/dL 1.18 1.19 1.36(H)  Sodium 135 - 146 mmol/L 137 137 139  Potassium 3.5 - 5.3 mmol/L 4.1 4.5 4.2  Chloride 98 - 110 mmol/L 105 100 105  CO2 20 - 32 mmol/L '25 23 25  ' Calcium 8.6 - 10.3 mg/dL 9.3 9.9 9.5  Total Protein 6.1 - 8.1 g/dL 7.0 8.0 7.4  Total Bilirubin 0.2 - 1.2 mg/dL 0.8 0.5 0.4  Alkaline Phos 39 - 117 IU/L - 82 69  AST 10 - 35 U/L '20 18 16  ' ALT 9 - 46 U/L '16 26 15    ' Lipid Panel     Component Value Date/Time   CHOL 191 03/15/2017 0924   TRIG 101 03/15/2017 0924   HDL 50 03/15/2017 0924   CHOLHDL 3.8 03/15/2017 0924   CHOLHDL 3.9 04/28/2016 1050   VLDL 21 04/28/2016 1050   LDLCALC 121 (H) 03/15/2017 0924    Assessment & Plan:   1. Type 2 diabetes mellitus without complication, with long-term current use of insulin (HCC) Controlled with A1c of 6.0 We will send of A1c and adjust regimen  accordingly - POCT glucose (manual entry) - CMP14+EGFR; Future - Hemoglobin A1c; Future  2. Pure hypercholesterolemia Last LDL was 121 which is above goal of less than 100 Continue statin and will check lipid panel - Lipid panel; Future  3. Essential hypertension Controlled Counseled on blood pressure goal of less than 130/80, low-sodium, DASH diet, medication compliance, 150 minutes of moderate intensity exercise per week. Discussed medication compliance, adverse effects.  4. Human immunodeficiency virus (HIV) disease ([place]) Continue antiretroviral therapy  5. Screening for colon cancer He would like to hold off on colonoscopy - Fecal occult  blood, imunochemical([facility]); Future   No orders of the defined types were placed in this encounter.   Follow-up: Return for follow up of chronic medical conditions.   Charlott Rakes MD

## 2017-12-19 NOTE — Patient Instructions (Signed)
Diabetes Mellitus and Nutrition When you have diabetes (diabetes mellitus), it is very important to have healthy eating habits because your blood sugar (glucose) levels are greatly affected by what you eat and drink. Eating healthy foods in the appropriate amounts, at about the same times every day, can help you:  Control your blood glucose.  Lower your risk of heart disease.  Improve your blood pressure.  Reach or maintain a healthy weight.  Every person with diabetes is different, and each person has different needs for a meal plan. Your health care provider may recommend that you work with a diet and nutrition specialist (dietitian) to make a meal plan that is best for you. Your meal plan may vary depending on factors such as:  The calories you need.  The medicines you take.  Your weight.  Your blood glucose, blood pressure, and cholesterol levels.  Your activity level.  Other health conditions you have, such as heart or kidney disease.  How do carbohydrates affect me? Carbohydrates affect your blood glucose level more than any other type of food. Eating carbohydrates naturally increases the amount of glucose in your blood. Carbohydrate counting is a method for keeping track of how many carbohydrates you eat. Counting carbohydrates is important to keep your blood glucose at a healthy level, especially if you use insulin or take certain oral diabetes medicines. It is important to know how many carbohydrates you can safely have in each meal. This is different for every person. Your dietitian can help you calculate how many carbohydrates you should have at each meal and for snack. Foods that contain carbohydrates include:  Bread, cereal, rice, pasta, and crackers.  Potatoes and corn.  Peas, beans, and lentils.  Milk and yogurt.  Fruit and juice.  Desserts, such as cakes, cookies, ice cream, and candy.  How does alcohol affect me? Alcohol can cause a sudden decrease in blood  glucose (hypoglycemia), especially if you use insulin or take certain oral diabetes medicines. Hypoglycemia can be a life-threatening condition. Symptoms of hypoglycemia (sleepiness, dizziness, and confusion) are similar to symptoms of having too much alcohol. If your health care provider says that alcohol is safe for you, follow these guidelines:  Limit alcohol intake to no more than 1 drink per day for nonpregnant women and 2 drinks per day for men. One drink equals 12 oz of beer, 5 oz of wine, or 1 oz of hard liquor.  Do not drink on an empty stomach.  Keep yourself hydrated with water, diet soda, or unsweetened iced tea.  Keep in mind that regular soda, juice, and other mixers may contain a lot of sugar and must be counted as carbohydrates.  What are tips for following this plan? Reading food labels  Start by checking the serving size on the label. The amount of calories, carbohydrates, fats, and other nutrients listed on the label are based on one serving of the food. Many foods contain more than one serving per package.  Check the total grams (g) of carbohydrates in one serving. You can calculate the number of servings of carbohydrates in one serving by dividing the total carbohydrates by 15. For example, if a food has 30 g of total carbohydrates, it would be equal to 2 servings of carbohydrates.  Check the number of grams (g) of saturated and trans fats in one serving. Choose foods that have low or no amount of these fats.  Check the number of milligrams (mg) of sodium in one serving. Most people   should limit total sodium intake to less than 2,300 mg per day.  Always check the nutrition information of foods labeled as "low-fat" or "nonfat". These foods may be higher in added sugar or refined carbohydrates and should be avoided.  Talk to your dietitian to identify your daily goals for nutrients listed on the label. Shopping  Avoid buying canned, premade, or processed foods. These  foods tend to be high in fat, sodium, and added sugar.  Shop around the outside edge of the grocery store. This includes fresh fruits and vegetables, bulk grains, fresh meats, and fresh dairy. Cooking  Use low-heat cooking methods, such as baking, instead of high-heat cooking methods like deep frying.  Cook using healthy oils, such as olive, canola, or sunflower oil.  Avoid cooking with butter, cream, or high-fat meats. Meal planning  Eat meals and snacks regularly, preferably at the same times every day. Avoid going long periods of time without eating.  Eat foods high in fiber, such as fresh fruits, vegetables, beans, and whole grains. Talk to your dietitian about how many servings of carbohydrates you can eat at each meal.  Eat 4-6 ounces of lean protein each day, such as lean meat, chicken, fish, eggs, or tofu. 1 ounce is equal to 1 ounce of meat, chicken, or fish, 1 egg, or 1/4 cup of tofu.  Eat some foods each day that contain healthy fats, such as avocado, nuts, seeds, and fish. Lifestyle   Check your blood glucose regularly.  Exercise at least 30 minutes 5 or more days each week, or as told by your health care provider.  Take medicines as told by your health care provider.  Do not use any products that contain nicotine or tobacco, such as cigarettes and e-cigarettes. If you need help quitting, ask your health care provider.  Work with a counselor or diabetes educator to identify strategies to manage stress and any emotional and social challenges. What are some questions to ask my health care provider?  Do I need to meet with a diabetes educator?  Do I need to meet with a dietitian?  What number can I call if I have questions?  When are the best times to check my blood glucose? Where to find more information:  American Diabetes Association: diabetes.org/food-and-fitness/food  Academy of Nutrition and Dietetics:  www.eatright.org/resources/health/diseases-and-conditions/diabetes  National Institute of Diabetes and Digestive and Kidney Diseases (NIH): www.niddk.nih.gov/health-information/diabetes/overview/diet-eating-physical-activity Summary  A healthy meal plan will help you control your blood glucose and maintain a healthy lifestyle.  Working with a diet and nutrition specialist (dietitian) can help you make a meal plan that is best for you.  Keep in mind that carbohydrates and alcohol have immediate effects on your blood glucose levels. It is important to count carbohydrates and to use alcohol carefully. This information is not intended to replace advice given to you by your health care provider. Make sure you discuss any questions you have with your health care provider. Document Released: 02/10/2005 Document Revised: 06/20/2016 Document Reviewed: 06/20/2016 Elsevier Interactive Patient Education  2018 Elsevier Inc.  

## 2017-12-20 ENCOUNTER — Encounter: Payer: Self-pay | Admitting: Family Medicine

## 2017-12-20 ENCOUNTER — Ambulatory Visit: Payer: Medicare Other | Attending: Family Medicine

## 2017-12-20 DIAGNOSIS — E7800 Pure hypercholesterolemia, unspecified: Secondary | ICD-10-CM | POA: Insufficient documentation

## 2017-12-20 DIAGNOSIS — E119 Type 2 diabetes mellitus without complications: Secondary | ICD-10-CM | POA: Insufficient documentation

## 2017-12-20 DIAGNOSIS — Z794 Long term (current) use of insulin: Secondary | ICD-10-CM | POA: Diagnosis not present

## 2017-12-20 NOTE — Progress Notes (Signed)
Patient here for lab visit only 

## 2017-12-21 ENCOUNTER — Other Ambulatory Visit: Payer: Self-pay | Admitting: Family Medicine

## 2017-12-21 DIAGNOSIS — Z794 Long term (current) use of insulin: Principal | ICD-10-CM

## 2017-12-21 DIAGNOSIS — E119 Type 2 diabetes mellitus without complications: Secondary | ICD-10-CM

## 2017-12-21 LAB — CMP14+EGFR
ALT: 16 IU/L (ref 0–44)
AST: 19 IU/L (ref 0–40)
Albumin/Globulin Ratio: 1.3 (ref 1.2–2.2)
Albumin: 4.1 g/dL (ref 3.5–5.5)
Alkaline Phosphatase: 71 IU/L (ref 39–117)
BUN/Creatinine Ratio: 14 (ref 9–20)
BUN: 17 mg/dL (ref 6–24)
Bilirubin Total: 0.4 mg/dL (ref 0.0–1.2)
CO2: 21 mmol/L (ref 20–29)
CREATININE: 1.23 mg/dL (ref 0.76–1.27)
Calcium: 9.6 mg/dL (ref 8.7–10.2)
Chloride: 102 mmol/L (ref 96–106)
GFR calc Af Amer: 74 mL/min/{1.73_m2} (ref >59)
GFR, EST NON AFRICAN AMERICAN: 64 mL/min/{1.73_m2} (ref >59)
GLUCOSE: 233 mg/dL — AB (ref 65–99)
Globulin, Total: 3.2 g/dL (ref 1.5–4.5)
POTASSIUM: 4.4 mmol/L (ref 3.5–5.2)
Sodium: 137 mmol/L (ref 134–144)
TOTAL PROTEIN: 7.3 g/dL (ref 6.0–8.5)

## 2017-12-21 LAB — LIPID PANEL
CHOL/HDL RATIO: 3 ratio (ref 0.0–5.0)
Cholesterol, Total: 129 mg/dL (ref 100–199)
HDL: 43 mg/dL (ref >39)
LDL CALC: 72 mg/dL (ref 0–99)
Triglycerides: 72 mg/dL (ref 0–149)
VLDL Cholesterol Cal: 14 mg/dL (ref 5–40)

## 2017-12-21 LAB — HEMOGLOBIN A1C
Est. average glucose Bld gHb Est-mCnc: 197 mg/dL
HEMOGLOBIN A1C: 8.5 % — AB (ref 4.8–5.6)

## 2017-12-21 MED ORDER — INSULIN GLARGINE 100 UNIT/ML SOLOSTAR PEN
20.0000 [IU] | PEN_INJECTOR | Freq: Every day | SUBCUTANEOUS | 3 refills | Status: DC
Start: 2017-12-21 — End: 2018-03-02

## 2017-12-22 ENCOUNTER — Telehealth: Payer: Self-pay

## 2017-12-22 NOTE — Telephone Encounter (Signed)
Patient was called and voicemail is not set up to leave a message. 

## 2017-12-22 NOTE — Telephone Encounter (Signed)
Patient was called and informed of lab results. 

## 2017-12-24 ENCOUNTER — Ambulatory Visit (HOSPITAL_COMMUNITY)
Admission: EM | Admit: 2017-12-24 | Discharge: 2017-12-24 | Disposition: A | Discharge status: Home / Self Care | Payer: Medicare Other | Attending: Internal Medicine | Admitting: Internal Medicine

## 2017-12-24 ENCOUNTER — Encounter (HOSPITAL_COMMUNITY): Payer: Self-pay | Admitting: Emergency Medicine

## 2017-12-24 DIAGNOSIS — J069 Acute upper respiratory infection, unspecified: Secondary | ICD-10-CM

## 2017-12-24 NOTE — ED Provider Notes (Signed)
[facility]    CSN: [id number] Arrival date & time: 12/24/17  1017     History   Chief Complaint Chief Complaint  Patient presents with  . Sore Throat  . Cough    HPI Benjamin Hunter is a 57 y.o. male.   Benjamin Hunter presents with complaints of post nasal drip which causes cough, which is worse at night. Symptoms started 5 days ago. Makes throat burn. No fevers. No known ill contacts. No gi/gu complaints. Has been using zyrtec, daytime cold/flu, mucinex, flonase and atrovent nasal sprays which have helped some. No rash. Hx of depression, dm, HIV, htn, hyperlipidemia. On biktarvy, RNA <20 on last check.     ROS per HPI.      Past Medical History:  Diagnosis Date  . Arthritis   . Depression   . Diabetes mellitus   . HIV (human immunodeficiency virus infection) (HCC)   . Hyperlipidemia   . Hypertension     Patient Active Problem List   Diagnosis Date Noted  . 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 Depression 01/14/2013  . Rash and nonspecific skin eruption 10/11/2012  . Recurrent low back pain 10/11/2012  . Left facial numbness 08/20/2012  . Seasonal allergies 09/20/2011  . Polyarthritis 09/06/2011  . Diabetes mellitus (HCC) 03/16/2009  . Human immunodeficiency virus (HIV) disease (HCC) 07/17/2008  . Hyperlipidemia 07/17/2008  . Hypertension 07/17/2008    History reviewed. No pertinent surgical history.     Home Medications    Prior to Admission medications   Medication Sig Start Date End Date Taking? Authorizing Provider  ACCU-CHEK SOFTCLIX LANCETS lancets Use as instructed for 3 times daily testing of blood sugar 12/07/15   Hoy RegisterNewlin, Enobong, MD  Alcohol Swabs PADS 1 each by Does not apply route 3 (three) times daily before meals. 11/16/16   Hoy RegisterNewlin, Enobong, MD  amLODipine (NORVASC) 5 MG tablet TAKE 1 TABLET(5 MG) BY MOUTH DAILY 07/04/17   Hoy RegisterNewlin, Enobong, MD  atorvastatin (LIPITOR) 40 MG tablet Take 1 tablet (40 mg total) by  mouth daily. 07/04/17   Hoy RegisterNewlin, Enobong, MD  B-D UF III MINI PEN NEEDLES 31G X 5 MM MISC INJECT AT BEDTIME 04/18/17   Hoy RegisterNewlin, Enobong, MD  bictegravir-emtricitabine-tenofovir AF (BIKTARVY) 50-200-25 MG TABS tablet Take 1 tablet by mouth daily. 11/17/17   Cliffton Astersampbell, John, MD  cetirizine (ZYRTEC) 10 MG tablet TAKE 1 TABLET(10 MG) BY MOUTH DAILY 10/09/17   Hoy RegisterNewlin, Enobong, MD  ezetimibe (ZETIA) 10 MG tablet TAKE 1 TABLET(10 MG) BY MOUTH DAILY 03/13/17   Hoy RegisterNewlin, Enobong, MD  fenofibrate (TRICOR) 48 MG tablet TAKE 1 TABLET(48 MG) BY MOUTH DAILY 10/09/17   Hoy RegisterNewlin, Enobong, MD  fluticasone (FLONASE) 50 MCG/ACT nasal spray Place 2 sprays into both nostrils daily. Patient not taking: Reported on 12/19/2017 03/13/17   Hoy RegisterNewlin, Enobong, MD  glipiZIDE (GLUCOTROL) 10 MG tablet TAKE 1 TABLET(10 MG) BY MOUTH TWICE DAILY BEFORE A MEAL 07/04/17   Newlin, Odette HornsEnobong, MD  glucose blood (ACCU-CHEK AVIVA PLUS) test strip Use as directed for 3 times daily testing of blood sugar. E11.9 12/30/16   Hoy RegisterNewlin, Enobong, MD  glucose blood (ACCU-CHEK AVIVA) test strip Use as instructed for 3 times daily testing of blood sugar 12/07/15   Hoy RegisterNewlin, Enobong, MD  Insulin Glargine (LANTUS SOLOSTAR) 100 UNIT/ML Solostar Pen Inject 20 Units into the skin daily at 10 pm. 12/21/17   Hoy RegisterNewlin, Enobong, MD  Lancet Devices [facility](ACCU-CHEK SOFTCLIX) lancets Use as instructed for 3 times daily testing of blood  sugar. 12/07/15   Hoy Register, MD    Family History Family History  Problem Relation Age of Onset  . Hypertension Mother     Social History Social History   Tobacco Use  . Smoking status: Never Smoker  . Smokeless tobacco: Never Used  Substance Use Topics  . Alcohol use: Yes    Alcohol/week: 0.6 - 1.2 oz    Types: 1 - 2 Cans of beer per week    Comment: occasional   . Drug use: No     Allergies   Shrimp [shellfish allergy] and Lisinopril   Review of Systems Review of Systems   Physical Exam Triage Vital Signs ED Triage Vitals  [12/24/17 1111]  Enc Vitals Group     BP (!) 104/55     Pulse Rate 76     Resp 14     Temp 98.3 F (36.8 C)     Temp src      SpO2 99 %     Weight      Height      Head Circumference      Peak Flow      Pain Score      Pain Loc      Pain Edu?      Excl. in [place]?    No data found.  Updated Vital Signs BP (!) 104/55   Pulse 76   Temp 98.3 F (36.8 C)   Resp 14   SpO2 99%    Physical Exam  Constitutional: He is oriented to person, place, and time. He appears well-developed and well-nourished.  HENT:  Head: Normocephalic and atraumatic.  Right Ear: Tympanic membrane, external ear and ear canal normal.  Left Ear: Tympanic membrane, external ear and ear canal normal.  Nose: Nose normal. Right sinus exhibits no maxillary sinus tenderness and no frontal sinus tenderness. Left sinus exhibits no maxillary sinus tenderness and no frontal sinus tenderness.  Mouth/Throat: Uvula is midline, oropharynx is clear and moist and mucous membranes are normal. Tonsils are 1+ on the right. Tonsils are 1+ on the left. No tonsillar exudate.  Eyes: Pupils are equal, round, and reactive to light. Conjunctivae are normal.  Neck: Normal range of motion.  Cardiovascular: Normal rate and regular rhythm.  Pulmonary/Chest: Effort normal and breath sounds normal.  Lymphadenopathy:    He has no cervical adenopathy.  Neurological: He is alert and oriented to person, place, and time.  Skin: Skin is warm and dry.  Vitals reviewed.    UC Treatments / Results  Labs (all labs ordered are listed, but only abnormal results are displayed) Labs Reviewed - No data to display  EKG None  Radiology No results found.  Procedures Procedures (including critical care time)  Medications Ordered in UC Medications - No data to display  Initial Impression / Assessment and Plan / UC Course  I have reviewed the triage vital signs and the nursing notes.  Pertinent labs & imaging results that were available  during my care of the patient were reviewed by me and considered in my medical decision making (see chart for details).     Non toxic. Afebrile. History and physical consistent with viral illness.  Supportive cares recommended.. Return precautions provided. If symptoms worsen or do not improve in the next week to return to be seen or to follow up with PCP.  Patient verbalized understanding and agreeable to plan.    Final Clinical Impressions(s) / UC Diagnoses   Final diagnoses:  Viral upper  respiratory tract infection     Discharge Instructions     Push fluids to ensure adequate hydration and keep secretions thin.  Tylenol and/or ibuprofen as needed for pain or fevers.  May continue with your over the counter medications as needed for symtpoms.  Increase the use of the Ipratropium nasal spray to 4 times a day, use prior to sleep as this may help with drainage.  If symptoms worsen or do not improve in the next week to return to be seen or to follow up with your PCP.     ED Prescriptions    None     Controlled Substance Prescriptions [place] Controlled Substance Registry consulted? Not Applicable   Georgetta Haber, NP 12/24/17 1203

## 2017-12-24 NOTE — Discharge Instructions (Signed)
Push fluids to ensure adequate hydration and keep secretions thin.  Tylenol and/or ibuprofen as needed for pain or fevers.  May continue with your over the counter medications as needed for symtpoms.  Increase the use of the Ipratropium nasal spray to 4 times a day, use prior to sleep as this may help with drainage.  If symptoms worsen or do not improve in the next week to return to be seen or to follow up with your PCP.

## 2017-12-24 NOTE — ED Triage Notes (Signed)
Pt c/o sinus symptoms, mild sore throat x5 days.

## 2017-12-26 LAB — FECAL OCCULT BLOOD, IMMUNOCHEMICAL: Fecal Occult Bld: NEGATIVE

## 2017-12-28 ENCOUNTER — Telehealth: Payer: Self-pay

## 2017-12-28 NOTE — Telephone Encounter (Signed)
Patient was called and informed of lab results. 

## 2018-01-01 ENCOUNTER — Encounter: Payer: Self-pay | Admitting: Internal Medicine

## 2018-01-18 ENCOUNTER — Other Ambulatory Visit: Payer: Self-pay | Admitting: Internal Medicine

## 2018-01-18 DIAGNOSIS — B20 Human immunodeficiency virus [HIV] disease: Secondary | ICD-10-CM

## 2018-01-23 ENCOUNTER — Other Ambulatory Visit: Payer: Self-pay | Admitting: Family Medicine

## 2018-01-23 DIAGNOSIS — E119 Type 2 diabetes mellitus without complications: Secondary | ICD-10-CM

## 2018-01-23 DIAGNOSIS — B20 Human immunodeficiency virus [HIV] disease: Secondary | ICD-10-CM

## 2018-01-23 DIAGNOSIS — E785 Hyperlipidemia, unspecified: Secondary | ICD-10-CM

## 2018-01-23 DIAGNOSIS — I10 Essential (primary) hypertension: Secondary | ICD-10-CM

## 2018-02-26 ENCOUNTER — Telehealth: Payer: Self-pay

## 2018-02-26 ENCOUNTER — Other Ambulatory Visit: Payer: Self-pay | Admitting: Family Medicine

## 2018-02-26 DIAGNOSIS — E0800 Diabetes mellitus due to underlying condition with hyperosmolarity without nonketotic hyperglycemic-hyperosmolar coma (NKHHC): Secondary | ICD-10-CM

## 2018-02-26 NOTE — Telephone Encounter (Signed)
Attempted to call patient back regarding message about flu shot. Was unable to reach patient at this time left message for patient to call office back.  Benjamin Hunter, [place]

## 2018-02-26 NOTE — Telephone Encounter (Signed)
-----   Message from Johaura Celedonio sent at 02/26/2018  8:36 AM EDT ----- Contact: self- [phone] Patient would like to know if he already had flu shot

## 2018-02-27 DIAGNOSIS — Z23 Encounter for immunization: Secondary | ICD-10-CM | POA: Diagnosis not present

## 2018-03-01 ENCOUNTER — Ambulatory Visit: Payer: Medicare Other

## 2018-03-02 ENCOUNTER — Telehealth: Payer: Self-pay

## 2018-03-02 MED ORDER — BASAGLAR KWIKPEN 100 UNIT/ML ~~LOC~~ SOPN: 20.0000 [IU] | PEN_INJECTOR | Freq: Every day | SUBCUTANEOUS | 2 refills | Status: DC

## 2018-03-02 NOTE — Telephone Encounter (Signed)
Basaglar sent for 20 units before bedtime.

## 2018-03-02 NOTE — Telephone Encounter (Signed)
Lantus is not preferred by insurance, the preferred products are Basaglar, Evaristo Bury, and Levemir. Please send a new RX to his pharmacy.

## 2018-03-14 ENCOUNTER — Other Ambulatory Visit: Payer: Self-pay

## 2018-03-14 ENCOUNTER — Telehealth: Payer: Self-pay | Admitting: Family Medicine

## 2018-03-14 MED ORDER — GLUCOSE BLOOD VI STRP: ORAL_STRIP | 12 refills | Status: AC

## 2018-03-14 MED ORDER — ONETOUCH DELICA LANCETS 33G MISC: 11 refills | Status: AC

## 2018-03-14 MED ORDER — ONETOUCH VERIO W/DEVICE KIT: PACK | 0 refills | Status: AC

## 2018-03-14 NOTE — Telephone Encounter (Signed)
1) Medication(s) Requested (by name): new meter   2) Pharmacy of Choice: [facility]   3) Special Requests:   Approved medications will be sent to the pharmacy, we will reach out if there is an issue.  Requests made after 3pm may not be addressed until the following business day!  If a patient is unsure of the name of the medication(s) please note and ask patient to call back when they are able to provide all info, do not send to responsible party until all information is available!

## 2018-03-14 NOTE — Telephone Encounter (Signed)
Meter has been refilled and sent to pharmacy on file.

## 2018-04-06 ENCOUNTER — Ambulatory Visit (HOSPITAL_COMMUNITY)
Admission: EM | Admit: 2018-04-06 | Discharge: 2018-04-06 | Disposition: A | Discharge status: Home / Self Care | Payer: Medicare Other | Attending: Internal Medicine | Admitting: Internal Medicine

## 2018-04-06 ENCOUNTER — Encounter (HOSPITAL_COMMUNITY): Payer: Self-pay

## 2018-04-06 DIAGNOSIS — R1084 Generalized abdominal pain: Secondary | ICD-10-CM | POA: Diagnosis not present

## 2018-04-06 NOTE — ED Provider Notes (Signed)
[facility]    CSN: [id number] Arrival date & time: 04/06/18  6712     History   Chief Complaint Chief Complaint  Patient presents with  . Abdominal Pain    HPI Benjamin Hunter is a 57 y.o. male.   Patient is a 57 year old male who presents with 2 days of intermittent abdominal cramping.  This started after eating a frozen cheeseburger that he cooked.  Symptoms been waxing and waning.  He took a Rolaids with some relief of discomfort.  He denies any associated nausea, vomiting, diarrhea, fever, chills, body aches.  He denies any recent traveling or sick contacts.  He denies any history of Crohn's, diverticulitis, IBS.  Last bowel movement was yesterday and normal without blood.  He denies any urinary symptoms.   ROS per HPI      Past Medical History:  Diagnosis Date  . Arthritis   . Depression   . Diabetes mellitus   . HIV (human immunodeficiency virus infection) ([place])   . Hyperlipidemia   . Hypertension     Patient Active Problem List   Diagnosis Date Noted  . 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 Depression 01/14/2013  . Rash and nonspecific skin eruption 10/11/2012  . Recurrent low back pain 10/11/2012  . Left facial numbness 08/20/2012  . Seasonal allergies 09/20/2011  . Polyarthritis 09/06/2011  . Diabetes mellitus ([place]) 03/16/2009  . Human immunodeficiency virus (HIV) disease ([place]) 07/17/2008  . Hyperlipidemia 07/17/2008  . Hypertension 07/17/2008    History reviewed. No pertinent surgical history.     Home Medications    Prior to Admission medications   Medication Sig Start Date End Date Taking? Authorizing Provider  Alcohol Swabs PADS 1 each by Does not apply route 3 (three) times daily before meals. 11/16/16   Charlott Rakes, MD  amLODipine (NORVASC) 5 MG tablet TAKE 1 TABLET(5 MG) BY MOUTH DAILY 07/04/17   Charlott Rakes, MD  atorvastatin (LIPITOR) 40 MG tablet Take 1 tablet (40 mg total) by mouth daily.  07/04/17   Charlott Rakes, MD  B-D UF III MINI PEN NEEDLES 31G X 5 MM MISC INJECT AT BEDTIME 02/26/18   Charlott Rakes, MD  BIKTARVY 50-200-25 MG TABS tablet TAKE 1 TABLET BY MOUTH DAILY 01/18/18   Michel Bickers, MD  Blood Glucose Monitoring [facility] VERIO) w/Device KIT USE AS DIRECTED 03/14/18   Charlott Rakes, MD  cetirizine (ZYRTEC) 10 MG tablet TAKE 1 TABLET(10 MG) BY MOUTH DAILY 10/09/17   Charlott Rakes, MD  ezetimibe (ZETIA) 10 MG tablet TAKE 1 TABLET(10 MG) BY MOUTH DAILY 03/13/17   Charlott Rakes, MD  fenofibrate (TRICOR) 48 MG tablet TAKE 1 TABLET(48 MG) BY MOUTH DAILY 10/09/17   Charlott Rakes, MD  fluticasone (FLONASE) 50 MCG/ACT nasal spray Place 2 sprays into both nostrils daily. Patient not taking: Reported on 12/19/2017 03/13/17   Charlott Rakes, MD  glipiZIDE (GLUCOTROL) 10 MG tablet TAKE 1 TABLET(10 MG) BY MOUTH TWICE DAILY BEFORE A MEAL 07/04/17   Charlott Rakes, MD  glucose blood (ONETOUCH VERIO) test strip Use as instructed 03/14/18   Charlott Rakes, MD  Insulin Glargine (BASAGLAR KWIKPEN) 100 UNIT/ML SOPN Inject 0.2 mLs (20 Units total) into the skin at bedtime. 03/02/18   Charlott Rakes, MD  Lancet Devices [facility]) lancets Use as instructed for 3 times daily testing of blood sugar. 12/07/15   Charlott Rakes, MD  [facility] DELICA LANCETS 45Y MISC USE AS DIRECTED 03/14/18   Charlott Rakes, MD  Family History Family History  Problem Relation Age of Onset  . Hypertension Mother     Social History Social History   Tobacco Use  . Smoking status: Never Smoker  . Smokeless tobacco: Never Used  Substance Use Topics  . Alcohol use: Yes    Alcohol/week: 1.0 - 2.0 standard drinks    Types: 1 - 2 Cans of beer per week    Comment: occasional   . Drug use: No     Allergies   Shrimp [shellfish allergy] and Lisinopril   Review of Systems Review of Systems   Physical Exam Triage Vital Signs ED Triage Vitals  Enc Vitals Group     BP 04/06/18  0944 134/85     Pulse Rate 04/06/18 0944 86     Resp 04/06/18 0944 18     Temp 04/06/18 0944 98.4 F (36.9 C)     Temp Source 04/06/18 0944 Oral     SpO2 04/06/18 0944 97 %     Weight --      Height --      Head Circumference --      Peak Flow --      Pain Score 04/06/18 0942 4     Pain Loc --      Pain Edu? --      Excl. in [place]? --    No data found.  Updated Vital Signs BP 134/85 (BP Location: Left Arm)   Pulse 86   Temp 98.4 F (36.9 C) (Oral)   Resp 18   SpO2 97%   Visual Acuity Right Eye Distance:   Left Eye Distance:   Bilateral Distance:    Right Eye Near:   Left Eye Near:    Bilateral Near:     Physical Exam  Constitutional: He appears well-developed and well-nourished.  Very pleasant. Non toxic or ill appearing.   HENT:  Head: Normocephalic and atraumatic.  Pulmonary/Chest: Effort normal.  Abdominal: Soft. Normal appearance and bowel sounds are normal.  Abdomen soft, non tender. No CVA tenderness. No rebound tenderness.   Neurological: He is alert.  Skin: Skin is warm and dry.  Psychiatric: He has a normal mood and affect.  Nursing note and vitals reviewed.    UC Treatments / Results  Labs (all labs ordered are listed, but only abnormal results are displayed) Labs Reviewed - No data to display  EKG None  Radiology No results found.  Procedures Procedures (including critical care time)  Medications Ordered in UC Medications - No data to display  Initial Impression / Assessment and Plan / UC Course  I have reviewed the triage vital signs and the nursing notes.  Pertinent labs & imaging results that were available during my care of the patient were reviewed by me and considered in my medical decision making (see chart for details).     Patient is a 57 year old male comes in with generalized abdominal discomfort and cramping that has been intermittent for a couple days.  This started after eating some frozen meat.  Relates this helped his  symptoms. No acute abdomen on exam.  Patient was nontender to abdomen.  He has no other worrisome symptoms. Vital signs stable he is nontoxic or ill-appearing Instructed that he can try ranitidine for his symptoms No spicy, greasy or upsetting foods For continued worsening symptoms he will need to go to the hospital Final Clinical Impressions(s) / UC Diagnoses   Final diagnoses:  Generalized abdominal pain     Discharge Instructions  I believe this abdominal cramping is associated with the food that she had eaten He can maybe try some Zantac or the generic is ranitidine over-the-counter to see if this helps some discomfort If you develop any worsening symptoms to include severe abdominal pain, nausea, vomiting or high fevers please go to the ER    ED Prescriptions    None     Controlled Substance Prescriptions [place] Controlled Substance Registry consulted? Not Applicable   Orvan July, NP 04/06/18 1036

## 2018-04-06 NOTE — Discharge Instructions (Signed)
I believe this abdominal cramping is associated with the food that she had eaten He can maybe try some Zantac or the generic is ranitidine over-the-counter to see if this helps some discomfort If you develop any worsening symptoms to include severe abdominal pain, nausea, vomiting or high fevers please go to the ER

## 2018-04-06 NOTE — ED Triage Notes (Signed)
Patient presents with abdominal pain for the past 2 days. NO nausea, vomiting, diarrhea.

## 2018-04-16 ENCOUNTER — Other Ambulatory Visit: Payer: Medicare Other

## 2018-04-16 DIAGNOSIS — B20 Human immunodeficiency virus [HIV] disease: Secondary | ICD-10-CM | POA: Diagnosis not present

## 2018-04-17 LAB — T-HELPER CELL (CD4) - (RCID CLINIC ONLY)
CD4 % Helper T Cell: 36 % (ref 33–55)
CD4 T CELL ABS: 480 /uL (ref 400–2700)

## 2018-04-18 ENCOUNTER — Encounter: Payer: Self-pay | Admitting: Family Medicine

## 2018-04-18 ENCOUNTER — Ambulatory Visit: Payer: Medicare Other | Attending: Family Medicine | Admitting: Family Medicine

## 2018-04-18 VITALS — BP 129/86 | HR 66 | Temp 98.2°F | Ht 71.0 in | Wt 197.2 lb

## 2018-04-18 DIAGNOSIS — Z794 Long term (current) use of insulin: Secondary | ICD-10-CM

## 2018-04-18 DIAGNOSIS — I10 Essential (primary) hypertension: Secondary | ICD-10-CM

## 2018-04-18 DIAGNOSIS — Z79899 Other long term (current) drug therapy: Secondary | ICD-10-CM | POA: Diagnosis not present

## 2018-04-18 DIAGNOSIS — F329 Major depressive disorder, single episode, unspecified: Secondary | ICD-10-CM | POA: Insufficient documentation

## 2018-04-18 DIAGNOSIS — E119 Type 2 diabetes mellitus without complications: Secondary | ICD-10-CM | POA: Diagnosis not present

## 2018-04-18 DIAGNOSIS — E1165 Type 2 diabetes mellitus with hyperglycemia: Secondary | ICD-10-CM

## 2018-04-18 DIAGNOSIS — B20 Human immunodeficiency virus [HIV] disease: Secondary | ICD-10-CM

## 2018-04-18 DIAGNOSIS — E7800 Pure hypercholesterolemia, unspecified: Secondary | ICD-10-CM | POA: Diagnosis not present

## 2018-04-18 DIAGNOSIS — M1990 Unspecified osteoarthritis, unspecified site: Secondary | ICD-10-CM | POA: Diagnosis not present

## 2018-04-18 DIAGNOSIS — Z8249 Family history of ischemic heart disease and other diseases of the circulatory system: Secondary | ICD-10-CM | POA: Insufficient documentation

## 2018-04-18 LAB — COMPREHENSIVE METABOLIC PANEL
AG Ratio: 1.2 (calc) (ref 1.0–2.5)
ALT: 18 U/L (ref 9–46)
AST: 16 U/L (ref 10–35)
Albumin: 4.2 g/dL (ref 3.6–5.1)
Alkaline phosphatase (APISO): 87 U/L (ref 40–115)
BILIRUBIN TOTAL: 0.5 mg/dL (ref 0.2–1.2)
BUN: 19 mg/dL (ref 7–25)
CALCIUM: 9.8 mg/dL (ref 8.6–10.3)
CHLORIDE: 101 mmol/L (ref 98–110)
CO2: 25 mmol/L (ref 20–32)
Creat: 1.28 mg/dL (ref 0.70–1.33)
Globulin: 3.4 g/dL (calc) (ref 1.9–3.7)
Glucose, Bld: 323 mg/dL — ABNORMAL HIGH (ref 65–99)
POTASSIUM: 4.2 mmol/L (ref 3.5–5.3)
SODIUM: 135 mmol/L (ref 135–146)
TOTAL PROTEIN: 7.6 g/dL (ref 6.1–8.1)

## 2018-04-18 LAB — CBC
HCT: 39.7 % (ref 38.5–50.0)
Hemoglobin: 13.1 g/dL — ABNORMAL LOW (ref 13.2–17.1)
MCH: 29.6 pg (ref 27.0–33.0)
MCHC: 33 g/dL (ref 32.0–36.0)
MCV: 89.6 fL (ref 80.0–100.0)
MPV: 12 fL (ref 7.5–12.5)
PLATELETS: 171 10*3/uL (ref 140–400)
RBC: 4.43 10*6/uL (ref 4.20–5.80)
RDW: 11.5 % (ref 11.0–15.0)
WBC: 4.2 10*3/uL (ref 3.8–10.8)

## 2018-04-18 LAB — HIV-1 RNA QUANT-NO REFLEX-BLD
HIV 1 RNA Quant: 29 copies/mL — ABNORMAL HIGH
HIV-1 RNA Quant, Log: 1.46 Log copies/mL — ABNORMAL HIGH

## 2018-04-18 LAB — POCT GLYCOSYLATED HEMOGLOBIN (HGB A1C): Hemoglobin A1C: 9 % — AB (ref 4.0–5.6)

## 2018-04-18 LAB — RPR: RPR: NONREACTIVE

## 2018-04-18 LAB — GLUCOSE, POCT (MANUAL RESULT ENTRY): POC Glucose: 196 mg/dl — AB (ref 70–99)

## 2018-04-18 MED ORDER — GLIPIZIDE 10 MG PO TABS: ORAL_TABLET | ORAL | 1 refills | Status: DC

## 2018-04-18 MED ORDER — AMLODIPINE BESYLATE 5 MG PO TABS: ORAL_TABLET | ORAL | 1 refills | Status: DC

## 2018-04-18 MED ORDER — ATORVASTATIN CALCIUM 40 MG PO TABS: 40.0000 mg | ORAL_TABLET | Freq: Every day | ORAL | 1 refills | Status: DC

## 2018-04-18 MED ORDER — BASAGLAR KWIKPEN 100 UNIT/ML ~~LOC~~ SOPN: 28.0000 [IU] | PEN_INJECTOR | Freq: Every day | SUBCUTANEOUS | 2 refills | Status: DC

## 2018-04-18 NOTE — Progress Notes (Signed)
Subjective:  Patient ID: Benjamin Hunter, male    DOB: Oct 06, 1960  Age: 57 y.o. MRN: [id number]  CC: Diabetes   HPI Benjamin Hunter is a 57 year old male with a Past medical history of HIV, DM II, HTN & Hyperlipidemia who presents today for follow up of chronic medical conditions. Patient recently visited [facility] with complaints of generalized abdominal pain which was treated with Ranitidine and he reports improvement in symptoms.Benjamin Hunter  HIV - (current CD4 is 22) Patient followed by Dr. Glendon Axe for HIV management. Per patient the next appointment is scheduled for December 4th, 2019. Denies recent fever, cough, or recent infections.  DM II - ( HgbA1c 9.0, previously 8.0) Patient admits that we has been "eating too many sweets". He states that his fasting glucose range from 195-240. He says that he walks 1-2 miles every day. He has not has his eye examination due to financial constraints. Denies episodes of hypoglycemia and adverse effects from medications.  HTN - Complaint with DASH diet and sodium restriction. Per patient compliant with taking antihypertensives. Denies chest pain, headache, visual disturbance, edema, speech difficulty, facial asymmetry or paralysis. Non - smoker. Hyperlipidemia - Compliant with low fat diet. Denies myalgias.    Colonoscopy Postponed due financial constraints.   Past Medical History:  Diagnosis Date  . Arthritis   . Depression   . Diabetes mellitus   . HIV (human immunodeficiency virus infection) ([place])   . Hyperlipidemia   . Hypertension   History reviewed. No pertinent surgical history. Family History  Problem Relation Age of Onset  . Hypertension Mother     Social History   Socioeconomic History  . Marital status: Single    Spouse name: Not on file  . Number of children: Not on file  . Years of education: Not on file  . Highest education level: Not on file  Occupational History  . Not on file  Social Needs  . Financial resource  strain: Not on file  . Food insecurity:    Worry: Not on file    Inability: Not on file  . Transportation needs:    Medical: Not on file    Non-medical: Not on file  Tobacco Use  . Smoking status: Never Smoker  . Smokeless tobacco: Never Used  Substance and Sexual Activity  . Alcohol use: Yes    Alcohol/week: 1.0 - 2.0 standard drinks    Types: 1 - 2 Cans of beer per week    Comment: occasional   . Drug use: No  . Sexual activity: Not on file  Lifestyle  . Physical activity:    Days per week: Not on file    Minutes per session: Not on file  . Stress: Not on file  Relationships  . Social connections:    Talks on phone: Not on file    Gets together: Not on file    Attends religious service: Not on file    Active member of club or organization: Not on file    Attends meetings of clubs or organizations: Not on file    Relationship status: Not on file  . Intimate partner violence:    Fear of current or ex partner: Not on file    Emotionally abused: Not on file    Physically abused: Not on file    Forced sexual activity: Not on file  Other Topics Concern  . Not on file  Social History Narrative  . Not on file     Outpatient Medications  Prior to Visit  Medication Sig Dispense Refill  . Alcohol Swabs PADS 1 each by Does not apply route 3 (three) times daily before meals. 90 each 11  . B-D UF III MINI PEN NEEDLES 31G X 5 MM MISC INJECT AT BEDTIME 100 each 0  . BIKTARVY 50-200-25 MG TABS tablet TAKE 1 TABLET BY MOUTH DAILY 30 tablet 5  . Blood Glucose Monitoring Suppl (ONETOUCH VERIO) w/Device KIT USE AS DIRECTED 1 kit 0  . cetirizine (ZYRTEC) 10 MG tablet TAKE 1 TABLET(10 MG) BY MOUTH DAILY 30 tablet 0  . ezetimibe (ZETIA) 10 MG tablet TAKE 1 TABLET(10 MG) BY MOUTH DAILY 90 tablet 1  . fenofibrate (TRICOR) 48 MG tablet TAKE 1 TABLET(48 MG) BY MOUTH DAILY 90 tablet 0  . glucose blood (ONETOUCH VERIO) test strip Use as instructed 100 each 12  . Lancet Devices (ACCU-CHEK  SOFTCLIX) lancets Use as instructed for 3 times daily testing of blood sugar. 1 each 0  . ONETOUCH DELICA LANCETS 37T MISC USE AS DIRECTED 100 each 11  . amLODipine (NORVASC) 5 MG tablet TAKE 1 TABLET(5 MG) BY MOUTH DAILY 90 tablet 1  . atorvastatin (LIPITOR) 40 MG tablet Take 1 tablet (40 mg total) by mouth daily. 90 tablet 1  . glipiZIDE (GLUCOTROL) 10 MG tablet TAKE 1 TABLET(10 MG) BY MOUTH TWICE DAILY BEFORE A MEAL 180 tablet 1  . Insulin Glargine (BASAGLAR KWIKPEN) 100 UNIT/ML SOPN Inject 0.2 mLs (20 Units total) into the skin at bedtime. 15 mL 2  . fluticasone (FLONASE) 50 MCG/ACT nasal spray Place 2 sprays into both nostrils daily. (Patient not taking: Reported on 12/19/2017) 16 g 3   No facility-administered medications prior to visit.     ROS Review of Systems  Constitutional: Negative for activity change, appetite change, fatigue and fever.  HENT: Negative for sinus pressure and sore throat.   Eyes: Negative for visual disturbance.  Respiratory: Negative for cough, chest tightness and shortness of breath.   Cardiovascular: Negative for chest pain and leg swelling.  Gastrointestinal: Negative for abdominal distention, abdominal pain, constipation and diarrhea.  Endocrine: Negative.   Genitourinary: Negative for dysuria.  Musculoskeletal: Negative for joint swelling and myalgias.  Skin: Negative for rash.  Allergic/Immunologic: Negative.   Neurological: Negative for weakness, light-headedness and numbness.  Psychiatric/Behavioral: Negative for dysphoric mood and suicidal ideas.    Objective:  BP 129/86   Pulse 66   Temp 98.2 F (36.8 C) (Oral)   Ht _0  (1.803 m)   Wt 197 lb 3.2 oz (89.4 kg)   SpO2 97%   BMI 27.50 kg/m   BP/Weight 04/18/2018 04/06/2018 0/62/6948  Systolic BP 546 270 350  Diastolic BP 86 85 55  Wt. (Lbs) 197.2 - -  BMI 27.5 - -      Physical Exam  Constitutional: He is oriented to person, place, and time. He appears well-developed and  well-nourished.  Cardiovascular: Normal rate, normal heart sounds and intact distal pulses.  No murmur heard. Pulmonary/Chest: Effort normal and breath sounds normal. He has no wheezes. He has no rales. He exhibits no tenderness.  Abdominal: Soft. Bowel sounds are normal. He exhibits no distension and no mass. There is no tenderness.  Musculoskeletal: Normal range of motion.  Neurological: He is alert and oriented to person, place, and time.  Skin: Skin is warm and dry.  Psychiatric: He has a normal mood and affect.    Lab Results  Component Value Date   HGBA1C 9.0 (A) 04/18/2018  CMP Latest Ref Rng & Units 04/16/2018 12/20/2017 11/29/2017  Glucose 65 - 99 mg/dL 323(H) 233(H) 286(H)  BUN 7 - 25 mg/dL _0 Creatinine 0.70 - 1.33 mg/dL 1.28 1.23 1.18  Sodium 135 - 146 mmol/L 135 137 137  Potassium 3.5 - 5.3 mmol/L 4.2 4.4 4.1  Chloride 98 - 110 mmol/L 101 102 105  CO2 20 - 32 mmol/L _1 Calcium 8.6 - 10.3 mg/dL 9.8 9.6 9.3  Total Protein 6.1 - 8.1 g/dL 7.6 7.3 7.0  Total Bilirubin 0.2 - 1.2 mg/dL 0.5 0.4 0.8  Alkaline Phos 39 - 117 IU/L - 71 -  AST 10 - 35 U/L _2 ALT 9 - 46 U/L _3 Lipid Panel     Component Value Date/Time   CHOL 129 12/20/2017 0929   TRIG 72 12/20/2017 0929   HDL 43 12/20/2017 0929   CHOLHDL 3.0 12/20/2017 0929   CHOLHDL 3.9 04/28/2016 1050   VLDL 21 04/28/2016 1050   LDLCALC 72 12/20/2017 0929    Assessment & Plan:   1. Type 2 diabetes mellitus without complication, with long-term current use of insulin (HCC) Uncontrolled with A1c of 9.0 Increase Lantus dose Counseled on Diabetic diet, my plate method, 283 minutes of moderate intensity exercise/week Keep blood sugar logs with fasting goals of 80-120 mg/dl, random of less than 180 and in the event of sugars less than 60 mg/dl or greater than 400 mg/dl please notify the clinic ASAP. It is recommended that you undergo annual eye exams and annual foot exams. Pneumonia vaccine  is recommended. - POCT glucose (manual entry) - POCT glycosylated hemoglobin (Hb A1C) - Microalbumin/Creatinine Ratio, Urine - glipiZIDE (GLUCOTROL) 10 MG tablet; TAKE 1 TABLET(10 MG) BY MOUTH TWICE DAILY BEFORE A MEAL  Dispense: 180 tablet; Refill: 1 - Insulin Glargine (BASAGLAR KWIKPEN) 100 UNIT/ML SOPN; Inject 0.28 mLs (28 Units total) into the skin at bedtime.  Dispense: 15 mL; Refill: 2  2. Essential hypertension, benign Controlled Counseled on blood pressure goal of less than 130/80, low-sodium, DASH diet, medication compliance, 150 minutes of moderate intensity exercise per week. Discussed medication compliance, adverse effects. - amLODipine (NORVASC) 5 MG tablet; TAKE 1 TABLET(5 MG) BY MOUTH DAILY  Dispense: 90 tablet; Refill: 1  3. Pure hypercholesterolemia Controlled Low-cholesterol diet - atorvastatin (LIPITOR) 40 MG tablet; Take 1 tablet (40 mg total) by mouth daily.  Dispense: 90 tablet; Refill: 1  4. Human immunodeficiency virus (HIV) disease ([place]) Currently on antiretroviral therapy Followed by infectious disease   Meds ordered this encounter  Medications  . amLODipine (NORVASC) 5 MG tablet    Sig: TAKE 1 TABLET(5 MG) BY MOUTH DAILY    Dispense:  90 tablet    Refill:  1    **Patient requests 90 days supply**  . atorvastatin (LIPITOR) 40 MG tablet    Sig: Take 1 tablet (40 mg total) by mouth daily.    Dispense:  90 tablet    Refill:  1  . glipiZIDE (GLUCOTROL) 10 MG tablet    Sig: TAKE 1 TABLET(10 MG) BY MOUTH TWICE DAILY BEFORE A MEAL    Dispense:  180 tablet    Refill:  1  . Insulin Glargine (BASAGLAR KWIKPEN) 100 UNIT/ML SOPN    Sig: Inject 0.28 mLs (28 Units total) into the skin at bedtime.    Dispense:  15 mL    Refill:  2    Follow-up: Return in about 3 months (around  07/19/2018) for follow up of chronic medical conditions.   Charlott Rakes MD

## 2018-04-19 LAB — MICROALBUMIN / CREATININE URINE RATIO
CREATININE, UR: 109.4 mg/dL
MICROALB/CREAT RATIO: 98 mg/g{creat} — AB (ref 0.0–30.0)
MICROALBUM., U, RANDOM: 107.2 ug/mL

## 2018-04-30 ENCOUNTER — Ambulatory Visit (INDEPENDENT_AMBULATORY_CARE_PROVIDER_SITE_OTHER): Payer: Medicare Other | Admitting: Internal Medicine

## 2018-04-30 ENCOUNTER — Ambulatory Visit: Payer: Medicare Other | Admitting: Family Medicine

## 2018-04-30 ENCOUNTER — Encounter: Payer: Self-pay | Admitting: Internal Medicine

## 2018-04-30 DIAGNOSIS — B20 Human immunodeficiency virus [HIV] disease: Secondary | ICD-10-CM

## 2018-04-30 DIAGNOSIS — F329 Major depressive disorder, single episode, unspecified: Secondary | ICD-10-CM

## 2018-04-30 DIAGNOSIS — Z794 Long term (current) use of insulin: Secondary | ICD-10-CM | POA: Diagnosis not present

## 2018-04-30 DIAGNOSIS — J069 Acute upper respiratory infection, unspecified: Secondary | ICD-10-CM | POA: Insufficient documentation

## 2018-04-30 DIAGNOSIS — E119 Type 2 diabetes mellitus without complications: Secondary | ICD-10-CM

## 2018-04-30 NOTE — Assessment & Plan Note (Signed)
His depression is in remission. 

## 2018-04-30 NOTE — Assessment & Plan Note (Signed)
He has very good long-term control of his infection.  He has already received his influenza vaccination.  He will continue Biktarvy and follow-up after lab work in 1 year.

## 2018-04-30 NOTE — Assessment & Plan Note (Signed)
He has an uncomplicated acute, upper respiratory infection that is probably viral in origin.  He will continue over-the-counter treatments for symptomatic therapy.

## 2018-04-30 NOTE — Progress Notes (Signed)
Patient Active Problem List   Diagnosis Date Noted  . Human immunodeficiency virus (HIV) disease ([place]) 07/17/2008    Priority: High  . 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 Seasonal allergies 09/20/2011    Priority: Medium  . Polyarthritis 09/06/2011    Priority: Medium  . Diabetes mellitus ([place]) 03/16/2009    Priority: Medium  . Hyperlipidemia 07/17/2008    Priority: Medium  . Hypertension 07/17/2008    Priority: Medium  . Upper respiratory infection 04/30/2018  . 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 Depression 01/14/2013  . Rash and nonspecific skin eruption 10/11/2012  . Recurrent low back pain 10/11/2012  . Left facial numbness 08/20/2012    Patient's Medications  New Prescriptions   No medications on file  Previous Medications   ALCOHOL SWABS PADS    1 each by Does not apply route 3 (three) times daily before meals.   AMLODIPINE (NORVASC) 5 MG TABLET    TAKE 1 TABLET(5 MG) BY MOUTH DAILY   ATORVASTATIN (LIPITOR) 40 MG TABLET    Take 1 tablet (40 mg total) by mouth daily.   B-D UF III MINI PEN NEEDLES 31G X 5 MM MISC    INJECT AT BEDTIME   BIKTARVY 50-200-25 MG TABS TABLET    TAKE 1 TABLET BY MOUTH DAILY   BLOOD GLUCOSE MONITORING SUPPL (ONETOUCH VERIO) W/DEVICE KIT    USE AS DIRECTED   CETIRIZINE (ZYRTEC) 10 MG TABLET    TAKE 1 TABLET(10 MG) BY MOUTH DAILY   EZETIMIBE (ZETIA) 10 MG TABLET    TAKE 1 TABLET(10 MG) BY MOUTH DAILY   FENOFIBRATE (TRICOR) 48 MG TABLET    TAKE 1 TABLET(48 MG) BY MOUTH DAILY   FLUTICASONE (FLONASE) 50 MCG/ACT NASAL SPRAY    Place 2 sprays into both nostrils daily.   GLIPIZIDE (GLUCOTROL) 10 MG TABLET    TAKE 1 TABLET(10 MG) BY MOUTH TWICE DAILY BEFORE A MEAL   GLUCOSE BLOOD (ONETOUCH VERIO) TEST STRIP    Use as instructed   INSULIN GLARGINE (BASAGLAR KWIKPEN) 100 UNIT/ML SOPN    Inject 0.28 mLs (28 Units total) into the skin at bedtime.   LANCET DEVICES (ACCU-CHEK SOFTCLIX) LANCETS    Use as instructed for 3 times daily  testing of blood sugar.   ONETOUCH DELICA LANCETS 47F MISC    USE AS DIRECTED  Modified Medications   No medications on file  Discontinued Medications   No medications on file    Subjective: Benjamin Hunter is in for his routine HIV follow-up visit.  He has had no problems obtaining, taking or tolerating his Biktarvy.  He takes it each morning.  He recalls missing only one dose since his last visit.  That occurred about 1 week ago when he got distracted and left home before taking it.  When he returned home he decided he would simply take a dose the following morning.  He is not using the pill canister that I gave him.  Several days ago he developed sinus pressure, rhinorrhea and a scratchy throat.  He has not had any fever, chills, sweats, cough or shortness of breath.  He admits that he has been somewhat slack on managing his blood sugars.  He tells me that he needs to lay off of the cookies.  His recent hemoglobin A1c had increased to 9.  He is currently not in a relationship and has not been sexually active.  Review of Systems: Review  of Systems  Constitutional: Negative for chills, diaphoresis, fever, malaise/fatigue and weight loss.  HENT: Positive for congestion and sore throat.   Respiratory: Negative for cough, sputum production and shortness of breath.   Cardiovascular: Negative for chest pain.  Gastrointestinal: Negative for abdominal pain, diarrhea, heartburn, nausea and vomiting.  Genitourinary: Negative for dysuria and frequency.  Musculoskeletal: Negative for joint pain and myalgias.  Skin: Negative for rash.  Neurological: Negative for dizziness and headaches.  Psychiatric/Behavioral: Negative for depression and substance abuse. The patient is not nervous/anxious.     Past Medical History:  Diagnosis Date  . Arthritis   . Depression   . Diabetes mellitus   . HIV (human immunodeficiency virus infection) ([place])   . Hyperlipidemia   . Hypertension     Social History   Tobacco  Use  . Smoking status: Never Smoker  . Smokeless tobacco: Never Used  Substance Use Topics  . Alcohol use: Yes    Alcohol/week: 1.0 - 2.0 standard drinks    Types: 1 - 2 Cans of beer per week    Comment: occasional   . Drug use: No    Family History  Problem Relation Age of Onset  . Hypertension Mother     Allergies  Allergen Reactions  . Shrimp [Shellfish Allergy] Nausea And Vomiting  . Lisinopril Rash    Palmar rash     Health Maintenance  Topic Date Due  . COLONOSCOPY  11/25/2009  . OPHTHALMOLOGY EXAM  04/13/2016  . HEMOGLOBIN A1C  10/17/2018  . FOOT EXAM  12/20/2018  . URINE MICROALBUMIN  04/19/2019  . TETANUS/TDAP  07/05/2027  . INFLUENZA VACCINE  Completed  . PNEUMOCOCCAL POLYSACCHARIDE VACCINE AGE 64-64 HIGH RISK  Completed  . Hepatitis C Screening  Completed  . HIV Screening  Completed    Objective:  Vitals:   04/30/18 1525  BP: 134/89  Pulse: 69  Temp: 98.5 F (36.9 C)  TempSrc: Oral  Weight: 201 lb (91.2 kg)  Height: '5\' 11"'  (1.803 m)   Body mass index is 28.03 kg/m.  Physical Exam  Constitutional: He is oriented to person, place, and time.  He is in his usual good spirits.  HENT:  Mouth/Throat: No oropharyngeal exudate.  Eyes: Conjunctivae are normal.  Cardiovascular: Normal rate, regular rhythm and normal heart sounds.  No murmur heard. Pulmonary/Chest: Effort normal and breath sounds normal.  Abdominal: Soft. He exhibits no mass. There is no tenderness.  Musculoskeletal: Normal range of motion.  Neurological: He is alert and oriented to person, place, and time.  Skin: No rash noted.  Psychiatric: He has a normal mood and affect.    Lab Results Lab Results  Component Value Date   WBC 4.2 04/16/2018   HGB 13.1 (L) 04/16/2018   HCT 39.7 04/16/2018   MCV 89.6 04/16/2018   PLT 171 04/16/2018    Lab Results  Component Value Date   CREATININE 1.28 04/16/2018   BUN 19 04/16/2018   NA 135 04/16/2018   K 4.2 04/16/2018   CL 101  04/16/2018   CO2 25 04/16/2018    Lab Results  Component Value Date   ALT 18 04/16/2018   AST 16 04/16/2018   ALKPHOS 71 12/20/2017   BILITOT 0.5 04/16/2018    Lab Results  Component Value Date   CHOL 129 12/20/2017   HDL 43 12/20/2017   LDLCALC 72 12/20/2017   TRIG 72 12/20/2017   CHOLHDL 3.0 12/20/2017   Lab Results  Component Value Date  LABRPR NON-REACTIVE 04/16/2018   HIV 1 RNA Quant (copies/mL)  Date Value  04/16/2018 29 (H)  11/29/2017 <20 DETECTED (A)  10/27/2016 <20 NOT DETECTED   CD4 T Cell Abs (/uL)  Date Value  04/16/2018 480  11/29/2017 540  10/27/2016 720     Problem List Items Addressed This Visit      High   Human immunodeficiency virus (HIV) disease ([place])    He has very good long-term control of his infection.  He has already received his influenza vaccination.  He will continue Biktarvy and follow-up after lab work in 1 year.      Relevant Orders   CBC   T-helper cell (CD4)- ([facility] clinic only)   Comprehensive metabolic panel   RPR   HIV-1 RNA quant-no reflex-bld     Medium   Diabetes mellitus ([place])    I talked to him about the importance of better glycemic control to avoid long-term complications of his diabetes.        Unprioritized   Upper respiratory infection    He has an uncomplicated acute, upper respiratory infection that is probably viral in origin.  He will continue over-the-counter treatments for symptomatic therapy.      Depression    His depression is in remission.           Michel Bickers, MD [facility] [phone] pager   [phone] cell 04/30/2018, 3:51 PM

## 2018-04-30 NOTE — Assessment & Plan Note (Signed)
I talked to him about the importance of better glycemic control to avoid long-term complications of his diabetes.

## 2018-05-01 ENCOUNTER — Ambulatory Visit: Payer: Medicare Other | Admitting: Internal Medicine

## 2018-05-02 ENCOUNTER — Ambulatory Visit: Payer: Medicare Other | Admitting: Internal Medicine

## 2018-05-04 ENCOUNTER — Other Ambulatory Visit: Payer: Self-pay | Admitting: Family Medicine

## 2018-05-04 DIAGNOSIS — E7800 Pure hypercholesterolemia, unspecified: Secondary | ICD-10-CM

## 2018-05-16 ENCOUNTER — Ambulatory Visit: Payer: Medicare Other | Admitting: Family Medicine

## 2018-06-06 ENCOUNTER — Ambulatory Visit: Payer: Medicare Other

## 2018-06-08 ENCOUNTER — Encounter: Payer: Self-pay | Admitting: Internal Medicine

## 2018-06-11 ENCOUNTER — Other Ambulatory Visit: Payer: Medicare Other

## 2018-06-11 ENCOUNTER — Ambulatory Visit: Payer: Medicare Other

## 2018-06-20 ENCOUNTER — Other Ambulatory Visit: Payer: Self-pay | Admitting: Family Medicine

## 2018-06-20 DIAGNOSIS — E0800 Diabetes mellitus due to underlying condition with hyperosmolarity without nonketotic hyperglycemic-hyperosmolar coma (NKHHC): Secondary | ICD-10-CM

## 2018-06-20 DIAGNOSIS — E7800 Pure hypercholesterolemia, unspecified: Secondary | ICD-10-CM

## 2018-06-25 ENCOUNTER — Ambulatory Visit: Payer: Medicare Other | Admitting: Internal Medicine

## 2018-07-16 ENCOUNTER — Other Ambulatory Visit: Payer: Self-pay | Admitting: Internal Medicine

## 2018-07-16 DIAGNOSIS — B20 Human immunodeficiency virus [HIV] disease: Secondary | ICD-10-CM

## 2018-07-23 ENCOUNTER — Other Ambulatory Visit: Payer: Self-pay | Admitting: Family Medicine

## 2018-07-23 DIAGNOSIS — E7800 Pure hypercholesterolemia, unspecified: Secondary | ICD-10-CM

## 2018-07-29 ENCOUNTER — Ambulatory Visit (HOSPITAL_COMMUNITY)
Admission: EM | Admit: 2018-07-29 | Discharge: 2018-07-29 | Disposition: A | Discharge status: Home / Self Care | Payer: Medicare Other | Attending: Family Medicine | Admitting: Family Medicine

## 2018-07-29 ENCOUNTER — Encounter (HOSPITAL_COMMUNITY): Payer: Self-pay

## 2018-07-29 ENCOUNTER — Other Ambulatory Visit: Payer: Self-pay

## 2018-07-29 DIAGNOSIS — R0981 Nasal congestion: Secondary | ICD-10-CM

## 2018-07-29 DIAGNOSIS — J302 Other seasonal allergic rhinitis: Secondary | ICD-10-CM

## 2018-07-29 NOTE — ED Provider Notes (Signed)
[place]   [id number] 07/29/18 Arrival Time: 1001   CC: URI symptoms   SUBJECTIVE: History from: patient.  Benjamin Hunter is a 58 y.o. male hx significant for DM, HIV, HLD, HTN, who presents with runny nose and congestion x 3 days. Denies known sick exposure or precipitating event.  Has tried flonase and mucus relief medications with relief.  Denies aggravating factors.  Reports previous symptoms in the past.   Denies fever, chills, fatigue, sinus pain, sore throat, SOB, wheezing, chest pain, nausea, changes in bowel or bladder habits.    ROS: As per HPI.  Past Medical History:  Diagnosis Date  . Arthritis   . Depression   . Diabetes mellitus   . HIV (human immunodeficiency virus infection) ([place])   . Hyperlipidemia   . Hypertension    History reviewed. No pertinent surgical history. Allergies  Allergen Reactions  . Shrimp [Shellfish Allergy] Nausea And Vomiting  . Lisinopril Rash    Palmar rash    No current facility-administered medications on file prior to encounter.    Current Outpatient Medications on File Prior to Encounter  Medication Sig Dispense Refill  . Alcohol Swabs PADS 1 each by Does not apply route 3 (three) times daily before meals. 90 each 11  . amLODipine (NORVASC) 5 MG tablet TAKE 1 TABLET(5 MG) BY MOUTH DAILY 90 tablet 1  . atorvastatin (LIPITOR) 40 MG tablet Take 1 tablet (40 mg total) by mouth daily. 90 tablet 1  . B-D UF III MINI PEN NEEDLES 31G X 5 MM MISC USE TO INJECT AT BEDTIME 100 each 2  . BIKTARVY 50-200-25 MG TABS tablet TAKE 1 TABLET BY MOUTH DAILY 30 tablet 5  . Blood Glucose Monitoring Suppl (ONETOUCH VERIO) w/Device KIT USE AS DIRECTED 1 kit 0  . cetirizine (ZYRTEC) 10 MG tablet TAKE 1 TABLET(10 MG) BY MOUTH DAILY 30 tablet 0  . ezetimibe (ZETIA) 10 MG tablet TAKE 1 TABLET(10 MG) BY MOUTH DAILY 90 tablet 0  . fenofibrate (TRICOR) 48 MG tablet TAKE 1 TABLET(48 MG) BY MOUTH DAILY 90 tablet 0  . fluticasone (FLONASE) 50  MCG/ACT nasal spray Place 2 sprays into both nostrils daily. 16 g 3  . glipiZIDE (GLUCOTROL) 10 MG tablet TAKE 1 TABLET(10 MG) BY MOUTH TWICE DAILY BEFORE A MEAL 180 tablet 1  . glucose blood (ONETOUCH VERIO) test strip Use as instructed 100 each 12  . Insulin Glargine (BASAGLAR KWIKPEN) 100 UNIT/ML SOPN Inject 0.28 mLs (28 Units total) into the skin at bedtime. 15 mL 2  . Lancet Devices (ACCU-CHEK SOFTCLIX) lancets Use as instructed for 3 times daily testing of blood sugar. 1 each 0  . ONETOUCH DELICA LANCETS 70W MISC USE AS DIRECTED 100 each 11   Social History   Socioeconomic History  . Marital status: Single    Spouse name: Not on file  . Number of children: Not on file  . Years of education: Not on file  . Highest education level: Not on file  Occupational History  . Not on file  Social Needs  . Financial resource strain: Not on file  . Food insecurity:    Worry: Not on file    Inability: Not on file  . Transportation needs:    Medical: Not on file    Non-medical: Not on file  Tobacco Use  . Smoking status: Never Smoker  . Smokeless tobacco: Never Used  Substance and Sexual Activity  . Alcohol use: Yes    Alcohol/week: 1.0 - 2.0  standard drinks    Types: 1 - 2 Cans of beer per week    Comment: occasional   . Drug use: No  . Sexual activity: Not on file    Comment: refused condoms  Lifestyle  . Physical activity:    Days per week: Not on file    Minutes per session: Not on file  . Stress: Not on file  Relationships  . Social connections:    Talks on phone: Not on file    Gets together: Not on file    Attends religious service: Not on file    Active member of club or organization: Not on file    Attends meetings of clubs or organizations: Not on file    Relationship status: Not on file  . Intimate partner violence:    Fear of current or ex partner: Not on file    Emotionally abused: Not on file    Physically abused: Not on file    Forced sexual activity: Not on  file  Other Topics Concern  . Not on file  Social History Narrative  . Not on file   Family History  Problem Relation Age of Onset  . Hypertension Mother     OBJECTIVE:  Vitals:   07/29/18 1014 07/29/18 1016  BP: 133/81   Pulse: 86   Resp: 18   Temp: 98.4 F (36.9 C)   TempSrc: Tympanic   SpO2: 100%   Weight:  200 lb (90.7 kg)     General appearance: alert; well appearing; speaking in full sentences and tolerating own secretions HEENT: NCAT; Ears: EACs clear, TMs pearly gray; Eyes: PERRL.  EOM grossly intact. Nose: nares patent without rhinorrhea, mild crusting within nares, Throat: oropharynx clear, tonsils non erythematous or enlarged, uvula midline  Neck: supple without LAD Lungs: unlabored respirations, symmetrical air entry; cough: absent; no respiratory distress; CTAB Heart: regular rate and rhythm.  Radial pulses 2+ symmetrical bilaterally Skin: warm and dry Psychological: alert and cooperative; normal mood and affect  ASSESSMENT & PLAN:  1. Nasal congestion   2. Seasonal allergies     Get plenty of rest and push fluids Continue with flonase and allergy medication daily for symptomatic relief Follow up with PCP if symptoms persist Return or go to ER if you have any new or worsening symptoms fever, chills, nausea, vomiting, chest pain, cough, shortness of breath, wheezing, abdominal pain, changes in bowel or bladder habits, etc...  Reviewed expectations re: course of current medical issues. Questions answered. Outlined signs and symptoms indicating need for more acute intervention. Patient verbalized understanding. After Visit Summary given.         Lestine Box, PA-C 07/29/18 1109

## 2018-07-29 NOTE — ED Triage Notes (Signed)
Pt cc he has cough and congestion x 3 day. Pt wants to make sure he dose not have Bronchitis.

## 2018-07-29 NOTE — Discharge Instructions (Signed)
Get plenty of rest and push fluids Continue with flonase and allergy medication daily for symptomatic relief Follow up with PCP if symptoms persist Return or go to ER if you have any new or worsening symptoms fever, chills, nausea, vomiting, chest pain, cough, shortness of breath, wheezing, abdominal pain, changes in bowel or bladder habits, etc..[name]

## 2018-07-30 ENCOUNTER — Ambulatory Visit: Payer: Medicare Other | Admitting: Family Medicine

## 2018-08-01 ENCOUNTER — Ambulatory Visit: Payer: Medicare Other | Attending: Family Medicine | Admitting: Physician Assistant

## 2018-08-01 VITALS — BP 151/95 | HR 78 | Temp 98.0°F | Resp 16 | Wt 199.6 lb

## 2018-08-01 DIAGNOSIS — Z760 Encounter for issue of repeat prescription: Secondary | ICD-10-CM | POA: Insufficient documentation

## 2018-08-01 DIAGNOSIS — Z21 Asymptomatic human immunodeficiency virus [HIV] infection status: Secondary | ICD-10-CM | POA: Insufficient documentation

## 2018-08-01 DIAGNOSIS — Z79899 Other long term (current) drug therapy: Secondary | ICD-10-CM | POA: Insufficient documentation

## 2018-08-01 DIAGNOSIS — Z888 Allergy status to other drugs, medicaments and biological substances status: Secondary | ICD-10-CM | POA: Insufficient documentation

## 2018-08-01 DIAGNOSIS — M1990 Unspecified osteoarthritis, unspecified site: Secondary | ICD-10-CM | POA: Diagnosis not present

## 2018-08-01 DIAGNOSIS — E785 Hyperlipidemia, unspecified: Secondary | ICD-10-CM | POA: Diagnosis not present

## 2018-08-01 DIAGNOSIS — E119 Type 2 diabetes mellitus without complications: Secondary | ICD-10-CM

## 2018-08-01 DIAGNOSIS — E7800 Pure hypercholesterolemia, unspecified: Secondary | ICD-10-CM

## 2018-08-01 DIAGNOSIS — Z794 Long term (current) use of insulin: Secondary | ICD-10-CM | POA: Diagnosis not present

## 2018-08-01 DIAGNOSIS — I10 Essential (primary) hypertension: Secondary | ICD-10-CM | POA: Diagnosis not present

## 2018-08-01 LAB — POCT GLYCOSYLATED HEMOGLOBIN (HGB A1C): HBA1C, POC (CONTROLLED DIABETIC RANGE): 9.1 % — AB (ref 0.0–7.0)

## 2018-08-01 LAB — GLUCOSE, POCT (MANUAL RESULT ENTRY): POC Glucose: 164 mg/dl — AB (ref 70–99)

## 2018-08-01 MED ORDER — FLUTICASONE PROPIONATE 50 MCG/ACT NA SUSP: 2.0000 | Freq: Every day | NASAL | 3 refills | Status: DC

## 2018-08-01 MED ORDER — AMLODIPINE BESYLATE 5 MG PO TABS: ORAL_TABLET | ORAL | 1 refills | Status: DC

## 2018-08-01 MED ORDER — EZETIMIBE 10 MG PO TABS: ORAL_TABLET | ORAL | 1 refills | Status: DC

## 2018-08-01 MED ORDER — BASAGLAR KWIKPEN 100 UNIT/ML ~~LOC~~ SOPN: 32.0000 [IU] | PEN_INJECTOR | Freq: Every day | SUBCUTANEOUS | 2 refills | Status: DC

## 2018-08-01 MED ORDER — FENOFIBRATE 48 MG PO TABS: ORAL_TABLET | ORAL | 1 refills | Status: DC

## 2018-08-01 MED ORDER — ATORVASTATIN CALCIUM 40 MG PO TABS: 40.0000 mg | ORAL_TABLET | Freq: Every day | ORAL | 1 refills | Status: DC

## 2018-08-01 MED ORDER — GLIPIZIDE 10 MG PO TABS: ORAL_TABLET | ORAL | 1 refills | Status: DC

## 2018-08-01 NOTE — Progress Notes (Signed)
Patient ID: Benjamin Hunter, male   DOB: 08/26/1960, 58 y.o.   MRN: [id number]   Benjamin Hunter, is a 58 y.o. male  QKM:[id number]  FBX:[id number]  DOB - [birth year]  Subjective:  Chief Complaint and HPI: Benjamin Hunter is a 58 y.o. male here for RF on meds.  Checks blood sugars in the mornings and usu ~200s.  No problems or concerns today.  Sees ID regularly for HIV.  Compliant with meds but just ran out of amlodipine and has been eating a lot of cookies.    ROS:   Constitutional:  No f/c, No night sweats, No unexplained weight loss. EENT:  No vision changes, No blurry vision, No hearing changes. No mouth, throat, or ear problems.  Respiratory: No cough, No SOB Cardiac: No CP, no palpitations GI:  No abd pain, No N/V/D. GU: No Urinary s/sx Musculoskeletal: No joint pain Neuro: No headache, no dizziness, no motor weakness.  Skin: No rash Endocrine:  No polydipsia. No polyuria.  Psych: Denies SI/HI  No problems updated.  ALLERGIES: Allergies  Allergen Reactions  . Shrimp [Shellfish Allergy] Nausea And Vomiting  . Lisinopril Rash    Palmar rash     PAST MEDICAL HISTORY: Past Medical History:  Diagnosis Date  . Arthritis   . Depression   . Diabetes mellitus   . HIV (human immunodeficiency virus infection) ([place])   . Hyperlipidemia   . Hypertension     MEDICATIONS AT HOME: Prior to Admission medications   Medication Sig Start Date End Date Taking? Authorizing Provider  Alcohol Swabs PADS 1 each by Does not apply route 3 (three) times daily before meals. 11/16/16   Charlott Rakes, MD  amLODipine (NORVASC) 5 MG tablet TAKE 1 TABLET(5 MG) BY MOUTH DAILY 08/01/18   [name] Caldron M, PA-C  atorvastatin (LIPITOR) 40 MG tablet Take 1 tablet (40 mg total) by mouth daily. 08/01/18   Argentina Donovan, PA-C  B-D UF III MINI PEN NEEDLES 31G X 5 MM MISC USE TO INJECT AT BEDTIME 06/21/18   Charlott Rakes, MD  BIKTARVY 50-200-25 MG TABS tablet TAKE 1 TABLET BY MOUTH DAILY 07/16/18    Michel Bickers, MD  Blood Glucose Monitoring Suppl [facility] VERIO) w/Device KIT USE AS DIRECTED 03/14/18   Charlott Rakes, MD  cetirizine (ZYRTEC) 10 MG tablet TAKE 1 TABLET(10 MG) BY MOUTH DAILY 10/09/17   Charlott Rakes, MD  ezetimibe (ZETIA) 10 MG tablet 1 daily 08/01/18   [name] Caldron M, PA-C  fenofibrate (TRICOR) 48 MG tablet 1 at bedtime 08/01/18   [name] Caldron M, PA-C  fluticasone (FLONASE) 50 MCG/ACT nasal spray Place 2 sprays into both nostrils daily. 08/01/18   Argentina Donovan, PA-C  glipiZIDE (GLUCOTROL) 10 MG tablet TAKE 1 TABLET(10 MG) BY MOUTH TWICE DAILY BEFORE A MEAL 08/01/18   McClung, Levada Dy M, PA-C  glucose blood (ONETOUCH VERIO) test strip Use as instructed 03/14/18   Charlott Rakes, MD  Insulin Glargine (BASAGLAR KWIKPEN) 100 UNIT/ML SOPN Inject 0.32 mLs (32 Units total) into the skin at bedtime. 08/01/18   Argentina Donovan, PA-C  Lancet Devices [facility]) lancets Use as instructed for 3 times daily testing of blood sugar. 12/07/15   Charlott Rakes, MD  ONETOUCH DELICA LANCETS 91B MISC USE AS DIRECTED 03/14/18   Charlott Rakes, MD     Objective:  EXAM:   Vitals:   08/01/18 1449  BP: (!) 151/95  Pulse: 78  Resp: 16  Temp: 98 F (36.7 C)  TempSrc: Oral  SpO2: 98%  Weight: 199 lb 9.6 oz (90.5 kg)    General appearance : A&OX3. NAD. Non-toxic-appearing HEENT: Atraumatic and Normocephalic.  PERRLA. EOM intact.  Neck: supple, no JVD. No cervical lymphadenopathy. No thyromegaly Chest/Lungs:  Breathing-non-labored, Good air entry bilaterally, breath sounds normal without rales, rhonchi, or wheezing  CVS: S1 S2 regular, no murmurs, gallops, rubs  Extremities: Bilateral Lower Ext shows no edema, both legs are warm to touch with = pulse throughout Neurology:  CN II-XII grossly intact, Non focal.   Psych:  TP linear. J/I WNL. Normal speech. Appropriate eye contact and affect.  Skin:  No Rash  Data Review Lab Results  Component Value Date   HGBA1C 9.1 (A)  08/01/2018   HGBA1C 9.0 (A) 04/18/2018   HGBA1C 8.5 (H) 12/20/2017     Assessment & Plan   1. Type 2 diabetes mellitus without complication, with long-term current use of insulin (HCC) Uncontrolled.  Non-compliant with diet.  Discussed at length to eliminate sugars and white carbs-he says he only has white bread at home.  Work on diet.  Increase basaglar.  Check sugars tid.   - Glucose (CBG) - HgB A1c - Insulin Glargine (BASAGLAR KWIKPEN) 100 UNIT/ML SOPN; Inject 0.32 mLs (32 Units total) into the skin at bedtime.  Dispense: 15 mL; Refill: 2 - glipiZIDE (GLUCOTROL) 10 MG tablet; TAKE 1 TABLET(10 MG) BY MOUTH TWICE DAILY BEFORE A MEAL  Dispense: 180 tablet; Refill: 1  2. Pure hypercholesterolemia - fenofibrate (TRICOR) 48 MG tablet; 1 at bedtime  Dispense: 90 tablet; Refill: 1 - ezetimibe (ZETIA) 10 MG tablet; 1 daily  Dispense: 90 tablet; Refill: 1 - atorvastatin (LIPITOR) 40 MG tablet; Take 1 tablet (40 mg total) by mouth daily.  Dispense: 90 tablet; Refill: 1  3. Essential hypertension, benign Uncontrolled-resume - amLODipine (NORVASC) 5 MG tablet; TAKE 1 TABLET(5 MG) BY MOUTH DAILY  Dispense: 90 tablet; Refill: 1  Patient have been counseled extensively about nutrition and exercise  Return in about 3 months (around 11/01/2018) for Dr Kevin Fenton bloodwork and DM/htn.  The patient was given clear instructions to go to ER or return to medical center if symptoms don't improve, worsen or new problems develop. The patient verbalized understanding. The patient was told to call to get lab results if they haven't heard anything in the next week.     [name] Caldron, PA-C [facility] and [place], [place]   08/01/2018, 3:04 PM

## 2018-08-01 NOTE — Patient Instructions (Signed)
Diabetes Mellitus and Nutrition, Adult  When you have diabetes (diabetes mellitus), it is very important to have healthy eating habits because your blood sugar (glucose) levels are greatly affected by what you eat and drink. Eating healthy foods in the appropriate amounts, at about the same times every day, can help you:  · Control your blood glucose.  · Lower your risk of heart disease.  · Improve your blood pressure.  · Reach or maintain a healthy weight.  Every person with diabetes is different, and each person has different needs for a meal plan. Your health care provider may recommend that you work with a diet and nutrition specialist (dietitian) to make a meal plan that is best for you. Your meal plan may vary depending on factors such as:  · The calories you need.  · The medicines you take.  · Your weight.  · Your blood glucose, blood pressure, and cholesterol levels.  · Your activity level.  · Other health conditions you have, such as heart or kidney disease.  How do carbohydrates affect me?  Carbohydrates, also called carbs, affect your blood glucose level more than any other type of food. Eating carbs naturally raises the amount of glucose in your blood. Carb counting is a method for keeping track of how many carbs you eat. Counting carbs is important to keep your blood glucose at a healthy level, especially if you use insulin or take certain oral diabetes medicines.  It is important to know how many carbs you can safely have in each meal. This is different for every person. Your dietitian can help you calculate how many carbs you should have at each meal and for each snack.  Foods that contain carbs include:  · Bread, cereal, rice, pasta, and crackers.  · Potatoes and corn.  · Peas, beans, and lentils.  · Milk and yogurt.  · Fruit and juice.  · Desserts, such as cakes, cookies, ice cream, and candy.  How does alcohol affect me?  Alcohol can cause a sudden decrease in blood glucose (hypoglycemia),  especially if you use insulin or take certain oral diabetes medicines. Hypoglycemia can be a life-threatening condition. Symptoms of hypoglycemia (sleepiness, dizziness, and confusion) are similar to symptoms of having too much alcohol.  If your health care provider says that alcohol is safe for you, follow these guidelines:  · Limit alcohol intake to no more than 1 drink per day for nonpregnant women and 2 drinks per day for men. One drink equals 12 oz of beer, 5 oz of wine, or 1½ oz of hard liquor.  · Do not drink on an empty stomach.  · Keep yourself hydrated with water, diet soda, or unsweetened iced tea.  · Keep in mind that regular soda, juice, and other mixers may contain a lot of sugar and must be counted as carbs.  What are tips for following this plan?    Reading food labels  · Start by checking the serving size on the "Nutrition Facts" label of packaged foods and drinks. The amount of calories, carbs, fats, and other nutrients listed on the label is based on one serving of the item. Many items contain more than one serving per package.  · Check the total grams (g) of carbs in one serving. You can calculate the number of servings of carbs in one serving by dividing the total carbs by 15. For example, if a food has 30 g of total carbs, it would be equal to 2   servings of carbs.  · Check the number of grams (g) of saturated and trans fats in one serving. Choose foods that have low or no amount of these fats.  · Check the number of milligrams (mg) of salt (sodium) in one serving. Most people should limit total sodium intake to less than 2,300 mg per day.  · Always check the nutrition information of foods labeled as "low-fat" or "nonfat". These foods may be higher in added sugar or refined carbs and should be avoided.  · Talk to your dietitian to identify your daily goals for nutrients listed on the label.  Shopping  · Avoid buying canned, premade, or processed foods. These foods tend to be high in fat, sodium,  and added sugar.  · Shop around the outside edge of the grocery store. This includes fresh fruits and vegetables, bulk grains, fresh meats, and fresh dairy.  Cooking  · Use low-heat cooking methods, such as baking, instead of high-heat cooking methods like deep frying.  · Cook using healthy oils, such as olive, canola, or sunflower oil.  · Avoid cooking with butter, cream, or high-fat meats.  Meal planning  · Eat meals and snacks regularly, preferably at the same times every day. Avoid going long periods of time without eating.  · Eat foods high in fiber, such as fresh fruits, vegetables, beans, and whole grains. Talk to your dietitian about how many servings of carbs you can eat at each meal.  · Eat 4-6 ounces (oz) of lean protein each day, such as lean meat, chicken, fish, eggs, or tofu. One oz of lean protein is equal to:  ? 1 oz of meat, chicken, or fish.  ? 1 egg.  ? ¼ cup of tofu.  · Eat some foods each day that contain healthy fats, such as avocado, nuts, seeds, and fish.  Lifestyle  · Check your blood glucose regularly.  · Exercise regularly as told by your health care provider. This may include:  ? 150 minutes of moderate-intensity or vigorous-intensity exercise each week. This could be brisk walking, biking, or water aerobics.  ? Stretching and doing strength exercises, such as yoga or weightlifting, at least 2 times a week.  · Take medicines as told by your health care provider.  · Do not use any products that contain nicotine or tobacco, such as cigarettes and e-cigarettes. If you need help quitting, ask your health care provider.  · Work with a counselor or diabetes educator to identify strategies to manage stress and any emotional and social challenges.  Questions to ask a health care provider  · Do I need to meet with a diabetes educator?  · Do I need to meet with a dietitian?  · What number can I call if I have questions?  · When are the best times to check my blood glucose?  Where to find more  information:  · American Diabetes Association: diabetes.org  · Academy of Nutrition and Dietetics: www.eatright.org  · National Institute of Diabetes and Digestive and Kidney Diseases (NIH): www.niddk.nih.gov  Summary  · A healthy meal plan will help you control your blood glucose and maintain a healthy lifestyle.  · Working with a diet and nutrition specialist (dietitian) can help you make a meal plan that is best for you.  · Keep in mind that carbohydrates (carbs) and alcohol have immediate effects on your blood glucose levels. It is important to count carbs and to use alcohol carefully.  This information is not intended to   replace advice given to you by your health care provider. Make sure you discuss any questions you have with your health care provider.  Document Released: 02/10/2005 Document Revised: 12/14/2016 Document Reviewed: 06/20/2016  Elsevier Interactive Patient Education © 2019 Elsevier Inc.

## 2018-08-06 ENCOUNTER — Ambulatory Visit (INDEPENDENT_AMBULATORY_CARE_PROVIDER_SITE_OTHER): Payer: Medicare Other

## 2018-08-06 ENCOUNTER — Ambulatory Visit (HOSPITAL_COMMUNITY)
Admission: EM | Admit: 2018-08-06 | Discharge: 2018-08-06 | Disposition: A | Discharge status: Home / Self Care | Payer: Medicare Other | Attending: Family Medicine | Admitting: Family Medicine

## 2018-08-06 ENCOUNTER — Encounter (HOSPITAL_COMMUNITY): Payer: Self-pay

## 2018-08-06 ENCOUNTER — Other Ambulatory Visit: Payer: Self-pay

## 2018-08-06 ENCOUNTER — Telehealth: Payer: Self-pay | Admitting: Family Medicine

## 2018-08-06 DIAGNOSIS — G8929 Other chronic pain: Secondary | ICD-10-CM

## 2018-08-06 DIAGNOSIS — M25562 Pain in left knee: Secondary | ICD-10-CM

## 2018-08-06 MED ORDER — PREDNISONE 10 MG (21) PO TBPK: ORAL_TABLET | ORAL | 0 refills | Status: DC

## 2018-08-06 MED ORDER — KETOROLAC TROMETHAMINE 60 MG/2ML IM SOLN
60.0000 mg | Freq: Once | INTRAMUSCULAR | Status: AC
  Administered 2018-08-06: 60 mg via INTRAMUSCULAR

## 2018-08-06 MED ORDER — KETOROLAC TROMETHAMINE 60 MG/2ML IM SOLN
INTRAMUSCULAR | Status: AC
  Filled 2018-08-06: qty 2

## 2018-08-06 NOTE — ED Provider Notes (Addendum)
[facility]    CSN: [id number] Arrival date & time: 08/06/18  4132     History   Chief Complaint Chief Complaint  Patient presents with  . Knee Pain    HPI Benjamin Hunter is a 58 y.o. male.   Patient is a 58 year old male that presents with left knee pain.  He has a past medical history of arthritis.  This is been constant and worsening over last 3 days.  He has had some mild swelling.  He denies any injury to the knee.  He has had steroid injection and fluid taken off the knee in the past.  This was approximately 5 years ago.  He has not take anything for his symptoms.  ROS per HPI      Past Medical History:  Diagnosis Date  . Arthritis   . Depression   . Diabetes mellitus   . HIV (human immunodeficiency virus infection) ([place])   . Hyperlipidemia   . Hypertension     Patient Active Problem List   Diagnosis Date Noted  . Upper respiratory infection 04/30/2018  . 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 Depression 01/14/2013  . Rash and nonspecific skin eruption 10/11/2012  . Recurrent low back pain 10/11/2012  . Left facial numbness 08/20/2012  . Seasonal allergies 09/20/2011  . Polyarthritis 09/06/2011  . Diabetes mellitus ([place]) 03/16/2009  . Human immunodeficiency virus (HIV) disease ([place]) 07/17/2008  . Hyperlipidemia 07/17/2008  . Hypertension 07/17/2008    History reviewed. No pertinent surgical history.     Home Medications    Prior to Admission medications   Medication Sig Start Date End Date Taking? Authorizing Provider  Alcohol Swabs PADS 1 each by Does not apply route 3 (three) times daily before meals. 11/16/16   Charlott Rakes, MD  amLODipine (NORVASC) 5 MG tablet TAKE 1 TABLET(5 MG) BY MOUTH DAILY 08/01/18   [name] Caldron M, PA-C  atorvastatin (LIPITOR) 40 MG tablet Take 1 tablet (40 mg total) by mouth daily. 08/01/18   Argentina Donovan, PA-C  B-D UF III MINI PEN NEEDLES 31G X 5 MM MISC USE TO INJECT AT  BEDTIME 06/21/18   Charlott Rakes, MD  BIKTARVY 50-200-25 MG TABS tablet TAKE 1 TABLET BY MOUTH DAILY 07/16/18   Michel Bickers, MD  Blood Glucose Monitoring Suppl [facility] VERIO) w/Device KIT USE AS DIRECTED 03/14/18   Charlott Rakes, MD  cetirizine (ZYRTEC) 10 MG tablet TAKE 1 TABLET(10 MG) BY MOUTH DAILY 10/09/17   Charlott Rakes, MD  ezetimibe (ZETIA) 10 MG tablet 1 daily 08/01/18   [name] Caldron M, PA-C  fenofibrate (TRICOR) 48 MG tablet 1 at bedtime 08/01/18   [name] Caldron M, PA-C  fluticasone (FLONASE) 50 MCG/ACT nasal spray Place 2 sprays into both nostrils daily. 08/01/18   Argentina Donovan, PA-C  glipiZIDE (GLUCOTROL) 10 MG tablet TAKE 1 TABLET(10 MG) BY MOUTH TWICE DAILY BEFORE A MEAL 08/01/18   McClung, Levada Dy M, PA-C  glucose blood (ONETOUCH VERIO) test strip Use as instructed 03/14/18   Charlott Rakes, MD  Insulin Glargine (BASAGLAR KWIKPEN) 100 UNIT/ML SOPN Inject 0.32 mLs (32 Units total) into the skin at bedtime. 08/01/18   Argentina Donovan, PA-C  Lancet Devices [facility]) lancets Use as instructed for 3 times daily testing of blood sugar. 12/07/15   Charlott Rakes, MD  ONETOUCH DELICA LANCETS 44W MISC USE AS DIRECTED 03/14/18   Charlott Rakes, MD  predniSONE (STERAPRED UNI-PAK 21 TAB) 10 MG (21) TBPK tablet 6  tabs for 1 day, then 5 tabs for 1 das, then 4 tabs for 1 day, then 3 tabs for 1 day, 2 tabs for 1 day, then 1 tab for 1 day 08/06/18   Orvan July, NP    Family History Family History  Problem Relation Age of Onset  . Hypertension Mother   . Heart failure Father     Social History Social History   Tobacco Use  . Smoking status: Never Smoker  . Smokeless tobacco: Never Used  Substance Use Topics  . Alcohol use: Yes    Alcohol/week: 1.0 - 2.0 standard drinks    Types: 1 - 2 Cans of beer per week    Comment: occasional   . Drug use: No     Allergies   Shrimp [shellfish allergy] and Lisinopril   Review of Systems Review of Systems    Constitutional: Positive for activity change. Negative for fever.  Musculoskeletal: Positive for arthralgias, gait problem and joint swelling.  Skin: Negative for color change, pallor, rash and wound.     Physical Exam Triage Vital Signs ED Triage Vitals  Enc Vitals Group     BP 08/06/18 0838 (!) 155/95     Pulse Rate 08/06/18 0838 82     Resp 08/06/18 0838 16     Temp 08/06/18 0838 98.5 F (36.9 C)     Temp Source 08/06/18 0838 Oral     SpO2 08/06/18 0838 95 %     Weight 08/06/18 0844 200 lb (90.7 kg)     Height --      Head Circumference --      Peak Flow --      Pain Score 08/06/18 0844 10     Pain Loc --      Pain Edu? --      Excl. in [place]? --    No data found.  Updated Vital Signs BP (!) 158/72 (BP Location: Left Arm)   Pulse 82   Temp 98.2 F (36.8 C) (Oral)   Resp 16   Wt 200 lb (90.7 kg)   SpO2 99%   BMI 27.89 kg/m   Visual Acuity Right Eye Distance:   Left Eye Distance:   Bilateral Distance:    Right Eye Near:   Left Eye Near:    Bilateral Near:     Physical Exam Vitals signs and nursing note reviewed.  Constitutional:      Appearance: Normal appearance.  HENT:     Head: Normocephalic and atraumatic.     Nose: Nose normal.  Neck:     Musculoskeletal: Normal range of motion.  Pulmonary:     Effort: Pulmonary effort is normal.  Musculoskeletal:        General: Tenderness present. No signs of injury.     Comments: Limited range of motion of the left knee.  No obvious swelling. Tender to palpation medially No erythema No laxity   Skin:    General: Skin is warm and dry.  Neurological:     Mental Status: He is alert.  Psychiatric:        Mood and Affect: Mood normal.      UC Treatments / Results  Labs (all labs ordered are listed, but only abnormal results are displayed) Labs Reviewed - No data to display  EKG None  Radiology Dg Knee Complete 4 Views Left  Result Date: 08/06/2018 CLINICAL DATA:  Chronic pain of the left knee.  EXAM: LEFT KNEE - COMPLETE 4+ VIEW COMPARISON:  None. FINDINGS: No evidence of dislocation, or joint effusion. Lucent line along of the articular surface of the right medial tibial plateau may represent an age-indeterminate fracture, degenerative or osteolytic changes. Vascular calcifications noted. IMPRESSION: Lucent line along the articular surface of the right medial tibial plateau may represent an age-indeterminate fracture, degenerative or osteolytic changes. No evidence dislocation or joint effusion. Electronically Signed   By: Fidela Salisbury M.D.   On: 08/06/2018 09:36    Procedures Procedures (including critical care time)  Medications Ordered in UC Medications  ketorolac (TORADOL) injection 60 mg (has no administration in time range)    Initial Impression / Assessment and Plan / UC Course  I have reviewed the triage vital signs and the nursing notes.  Pertinent labs & imaging results that were available during my care of the patient were reviewed by me and considered in my medical decision making (see chart for details).     X ray revealed tibial plateau fracture.  This is most likely an old injury. We will treat the pain and inflammation with Toradol injection here in clinic and prednisone daily for the next 5 days Rest, ice, elevate and wear a knee sleeve Follow up as needed for continued or worsening symptoms  Final Clinical Impressions(s) / UC Diagnoses   Final diagnoses:  None     Discharge Instructions     We will do a Toradol injection here in the clinic and have you take prednisone for the next 5 days with food Rest, Ice, Evevate and wear the knee sleeve.  Follow up as needed for continued or worsening symptoms     ED Prescriptions    Medication Sig Dispense Auth. Provider   predniSONE (STERAPRED UNI-PAK 21 TAB) 10 MG (21) TBPK tablet 6 tabs for 1 day, then 5 tabs for 1 das, then 4 tabs for 1 day, then 3 tabs for 1 day, 2 tabs for 1 day, then 1 tab for 1  day 21 tablet Rozanna Box, Etola Mull A, NP     Controlled Substance Prescriptions [place] Controlled Substance Registry consulted? Not Applicable        Orvan July, NP 08/06/18 1007

## 2018-08-06 NOTE — Discharge Instructions (Addendum)
We will do a Toradol injection here in the clinic and have you take prednisone for the next 5 days with food Rest, Ice, Evevate and wear the knee sleeve.  Follow up as needed for continued or worsening symptoms

## 2018-08-06 NOTE — Telephone Encounter (Signed)
Pt came in to drop off scat application please follow up once completed

## 2018-08-06 NOTE — ED Triage Notes (Signed)
Pt cc he has left knee pain  X 3 days. Pt thinks he may have some fluid on it.

## 2018-08-06 NOTE — Telephone Encounter (Signed)
Patient was called and informed to fill out application and bring into clinic for signature.

## 2018-08-06 NOTE — Telephone Encounter (Signed)
New Message   Pt states he is wanting to know if Dr. Alvis Lemmings can approve him for the SCAT program so that he can have transportation to his doctor appts. Please f/u

## 2018-08-08 ENCOUNTER — Ambulatory Visit: Payer: Medicare Other | Admitting: Family Medicine

## 2018-08-14 ENCOUNTER — Other Ambulatory Visit: Payer: Self-pay

## 2018-08-14 ENCOUNTER — Ambulatory Visit: Payer: Medicare Other | Attending: Family Medicine | Admitting: Family Medicine

## 2018-08-14 ENCOUNTER — Encounter: Payer: Self-pay | Admitting: Family Medicine

## 2018-08-14 ENCOUNTER — Telehealth: Payer: Self-pay

## 2018-08-14 VITALS — BP 125/80 | HR 77 | Temp 98.3°F | Ht 71.0 in | Wt 199.6 lb

## 2018-08-14 DIAGNOSIS — A601 Herpesviral infection of perianal skin and rectum: Secondary | ICD-10-CM

## 2018-08-14 DIAGNOSIS — E119 Type 2 diabetes mellitus without complications: Secondary | ICD-10-CM | POA: Diagnosis not present

## 2018-08-14 DIAGNOSIS — B009 Herpesviral infection, unspecified: Secondary | ICD-10-CM | POA: Diagnosis not present

## 2018-08-14 DIAGNOSIS — Z79899 Other long term (current) drug therapy: Secondary | ICD-10-CM | POA: Insufficient documentation

## 2018-08-14 DIAGNOSIS — K648 Other hemorrhoids: Secondary | ICD-10-CM | POA: Diagnosis not present

## 2018-08-14 DIAGNOSIS — R21 Rash and other nonspecific skin eruption: Secondary | ICD-10-CM | POA: Diagnosis not present

## 2018-08-14 DIAGNOSIS — B20 Human immunodeficiency virus [HIV] disease: Secondary | ICD-10-CM | POA: Insufficient documentation

## 2018-08-14 DIAGNOSIS — E785 Hyperlipidemia, unspecified: Secondary | ICD-10-CM | POA: Insufficient documentation

## 2018-08-14 DIAGNOSIS — I10 Essential (primary) hypertension: Secondary | ICD-10-CM | POA: Insufficient documentation

## 2018-08-14 DIAGNOSIS — Z794 Long term (current) use of insulin: Secondary | ICD-10-CM | POA: Diagnosis not present

## 2018-08-14 DIAGNOSIS — Z8249 Family history of ischemic heart disease and other diseases of the circulatory system: Secondary | ICD-10-CM | POA: Diagnosis not present

## 2018-08-14 DIAGNOSIS — Z7901 Long term (current) use of anticoagulants: Secondary | ICD-10-CM | POA: Insufficient documentation

## 2018-08-14 MED ORDER — HYDROCORTISONE ACE-PRAMOXINE 2.5-1 % RE CREA: 1.0000 "application " | TOPICAL_CREAM | Freq: Three times a day (TID) | RECTAL | 1 refills | Status: AC

## 2018-08-14 MED ORDER — VALACYCLOVIR HCL 1 G PO TABS: 1000.0000 mg | ORAL_TABLET | Freq: Two times a day (BID) | ORAL | 0 refills | Status: DC

## 2018-08-14 NOTE — Progress Notes (Signed)
Patient has rash on buttocks and anal area.

## 2018-08-14 NOTE — Progress Notes (Signed)
Subjective:  Patient ID: Benjamin Hunter, male    DOB: [birth year]  Age: 58 y.o. MRN: [id number]  CC: Rash   HPI Ira Dougher is a 58 year old male with a Past medical history of HIV, type 2 diabetes mellitus, hypertension who presents today for an acute visit complaining of a rash in his anal region which appeared a few days ago and is described as painful and at other times slightly burning.  It flares up whenever he uses the bathroom and wipes with the tissue paper. Denies bleeding from lesion, denies fever. He does have a positive history of genital herpes and was on medication several years ago which were discontinued as he did not have any flares. He denies presence of rash in other body parts.  Past Medical History:  Diagnosis Date  . Arthritis   . Depression   . Diabetes mellitus   . HIV (human immunodeficiency virus infection) ([place])   . Hyperlipidemia   . Hypertension     History reviewed. No pertinent surgical history.  Family History  Problem Relation Age of Onset  . Hypertension Mother   . Heart failure Father     Allergies  Allergen Reactions  . Shrimp [Shellfish Allergy] Nausea And Vomiting  . Lisinopril Rash    Palmar rash     Outpatient Medications Prior to Visit  Medication Sig Dispense Refill  . Alcohol Swabs PADS 1 each by Does not apply route 3 (three) times daily before meals. 90 each 11  . amLODipine (NORVASC) 5 MG tablet TAKE 1 TABLET(5 MG) BY MOUTH DAILY 90 tablet 1  . atorvastatin (LIPITOR) 40 MG tablet Take 1 tablet (40 mg total) by mouth daily. 90 tablet 1  . B-D UF III MINI PEN NEEDLES 31G X 5 MM MISC USE TO INJECT AT BEDTIME 100 each 2  . BIKTARVY 50-200-25 MG TABS tablet TAKE 1 TABLET BY MOUTH DAILY 30 tablet 5  . Blood Glucose Monitoring Suppl (ONETOUCH VERIO) w/Device KIT USE AS DIRECTED 1 kit 0  . cetirizine (ZYRTEC) 10 MG tablet TAKE 1 TABLET(10 MG) BY MOUTH DAILY 30 tablet 0  . ezetimibe (ZETIA) 10 MG tablet 1 daily 90 tablet 1   . fenofibrate (TRICOR) 48 MG tablet 1 at bedtime 90 tablet 1  . fluticasone (FLONASE) 50 MCG/ACT nasal spray Place 2 sprays into both nostrils daily. 16 g 3  . glipiZIDE (GLUCOTROL) 10 MG tablet TAKE 1 TABLET(10 MG) BY MOUTH TWICE DAILY BEFORE A MEAL 180 tablet 1  . glucose blood (ONETOUCH VERIO) test strip Use as instructed 100 each 12  . Insulin Glargine (BASAGLAR KWIKPEN) 100 UNIT/ML SOPN Inject 0.32 mLs (32 Units total) into the skin at bedtime. 15 mL 2  . Lancet Devices (ACCU-CHEK SOFTCLIX) lancets Use as instructed for 3 times daily testing of blood sugar. 1 each 0  . ONETOUCH DELICA LANCETS 78G MISC USE AS DIRECTED 100 each 11  . predniSONE (STERAPRED UNI-PAK 21 TAB) 10 MG (21) TBPK tablet 6 tabs for 1 day, then 5 tabs for 1 das, then 4 tabs for 1 day, then 3 tabs for 1 day, 2 tabs for 1 day, then 1 tab for 1 day 21 tablet 0   No facility-administered medications prior to visit.      ROS Review of Systems  Constitutional: Negative for activity change and appetite change.  HENT: Negative for sinus pressure and sore throat.   Eyes: Negative for visual disturbance.  Respiratory: Negative for cough, chest tightness and shortness  of breath.   Cardiovascular: Negative for chest pain and leg swelling.  Gastrointestinal: Negative for abdominal distention, abdominal pain, constipation and diarrhea.  Endocrine: Negative.   Genitourinary: Negative for dysuria.  Musculoskeletal: Negative for joint swelling and myalgias.  Skin: Positive for rash.  Allergic/Immunologic: Negative.   Neurological: Negative for weakness, light-headedness and numbness.  Psychiatric/Behavioral: Negative for dysphoric mood and suicidal ideas.    Objective:  BP 125/80   Pulse 77   Temp 98.3 F (36.8 C) (Oral)   Ht '5\' 11"'  (1.803 m)   Wt 199 lb 9.6 oz (90.5 kg)   SpO2 100%   BMI 27.84 kg/m   BP/Weight 08/14/2018 06/06/5499 10/05/6823  Systolic BP 749 355 217  Diastolic BP 80 72 95  Wt. (Lbs) 199.6 200 199.6   BMI 27.84 27.89 27.84      Physical Exam Constitutional:      Appearance: He is well-developed.  Cardiovascular:     Rate and Rhythm: Normal rate.     Heart sounds: Normal heart sounds. No murmur.  Pulmonary:     Effort: Pulmonary effort is normal.     Breath sounds: Normal breath sounds. No wheezing or rales.  Chest:     Chest wall: No tenderness.  Abdominal:     General: Bowel sounds are normal. There is no distension.     Palpations: Abdomen is soft. There is no mass.     Tenderness: There is no abdominal tenderness.  Genitourinary:    Comments: Presence of vesicles and scabs on supero medial and medial aspect of right gluteus External hemorrhoid at 9:00, nonthrombosed Musculoskeletal: Normal range of motion.  Neurological:     Mental Status: He is alert and oriented to person, place, and time.     CMP Latest Ref Rng & Units 04/16/2018 12/20/2017 11/29/2017  Glucose 65 - 99 mg/dL 323(H) 233(H) 286(H)  BUN 7 - 25 mg/dL '19 17 13  ' Creatinine 0.70 - 1.33 mg/dL 1.28 1.23 1.18  Sodium 135 - 146 mmol/L 135 137 137  Potassium 3.5 - 5.3 mmol/L 4.2 4.4 4.1  Chloride 98 - 110 mmol/L 101 102 105  CO2 20 - 32 mmol/L '25 21 25  ' Calcium 8.6 - 10.3 mg/dL 9.8 9.6 9.3  Total Protein 6.1 - 8.1 g/dL 7.6 7.3 7.0  Total Bilirubin 0.2 - 1.2 mg/dL 0.5 0.4 0.8  Alkaline Phos 39 - 117 IU/L - 71 -  AST 10 - 35 U/L '16 19 20  ' ALT 9 - 46 U/L '18 16 16    ' Lipid Panel     Component Value Date/Time   CHOL 129 12/20/2017 0929   TRIG 72 12/20/2017 0929   HDL 43 12/20/2017 0929   CHOLHDL 3.0 12/20/2017 0929   CHOLHDL 3.9 04/28/2016 1050   VLDL 21 04/28/2016 1050   LDLCALC 72 12/20/2017 0929    CBC    Component Value Date/Time   WBC 4.2 04/16/2018 1006   RBC 4.43 04/16/2018 1006   HGB 13.1 (L) 04/16/2018 1006   HCT 39.7 04/16/2018 1006   PLT 171 04/16/2018 1006   MCV 89.6 04/16/2018 1006   MCH 29.6 04/16/2018 1006   MCHC 33.0 04/16/2018 1006   RDW 11.5 04/16/2018 1006   LYMPHSABS 1.2  08/10/2012 1037   MONOABS 0.2 08/10/2012 1037   EOSABS 0.2 08/10/2012 1037   BASOSABS 0.0 08/10/2012 1037    Lab Results  Component Value Date   HGBA1C 9.1 (A) 08/01/2018    Assessment & Plan:   1. Other  hemorrhoids Sitz bath's, increase fiber intake - hydrocortisone-pramoxine (ANALPRAM HC) 2.5-1 % rectal cream; Place 1 application rectally 3 (three) times daily.  Dispense: 30 g; Refill: 1  2. Herpes simplex infection of perianal skin - valACYclovir (VALTREX) 1000 MG tablet; Take 1 tablet (1,000 mg total) by mouth 2 (two) times daily.  Dispense: 20 tablet; Refill: 0   Meds ordered this encounter  Medications  . hydrocortisone-pramoxine (ANALPRAM HC) 2.5-1 % rectal cream    Sig: Place 1 application rectally 3 (three) times daily.    Dispense:  30 g    Refill:  1  . valACYclovir (VALTREX) 1000 MG tablet    Sig: Take 1 tablet (1,000 mg total) by mouth 2 (two) times daily.    Dispense:  20 tablet    Refill:  0    Follow-up: Return in about 3 months (around 11/14/2018) for follow up of chronic medical conditions.       Charlott Rakes, MD, FAAFP. [facility] and [place], [place]   08/14/2018, 4:00 PM

## 2018-08-14 NOTE — Patient Instructions (Signed)
Genital Herpes °Genital herpes is a common sexually transmitted infection (STI) that is caused by a virus. The virus spreads from person to person through sexual contact. Infection can cause itching, blisters, and sores around the genitals or rectum. Symptoms may last several days and then go away This is called an outbreak. However, the virus remains in your body, so you may have more outbreaks in the future. The time between outbreaks varies and can be months or years. °Genital herpes affects men and women. It is particularly concerning for pregnant women because the virus can be passed to the baby during delivery and can cause serious problems. Genital herpes is also a concern for people who have a weak disease-fighting (immune) system. °What are the causes? °This condition is caused by the herpes simplex virus (HSV) type 1 or type 2. The virus may spread through: °· Sexual contact with an infected person, including vaginal, anal, and oral sex. °· Contact with fluid from a herpes sore. °· The skin. This means that you can get herpes from an infected partner even if he or she does not have a visible sore or does not know that he or she is infected. °What increases the risk? °You are more likely to develop this condition if: °· You have sex with many partners. °· You do not use latex condoms during sex. °What are the signs or symptoms? °Most people do not have symptoms (asymptomatic) or have mild symptoms that may be mistaken for other skin problems. Symptoms may include: °· Small red bumps near the genitals, rectum, or mouth. These bumps turn into blisters and then turn into sores. °· Flu-like symptoms, including: °? Fever. °? Body aches. °? Swollen lymph nodes. °? Headache. °· Painful urination. °· Pain and itching in the genital area or rectal area. °· Vaginal discharge. °· Tingling or shooting pain in the legs and buttocks. °Generally, symptoms are more severe and last longer during the first (primary)  outbreak. Flu-like symptoms are also more common during the primary outbreak. °How is this diagnosed? °Genital herpes may be diagnosed based on: °· A physical exam. °· Your medical history. °· Blood tests. °· A test of a fluid sample (culture) from an open sore. °How is this treated? °There is no cure for this condition, but treatment with antiviral medicines that are taken by mouth (orally) can do the following: °· Speed up healing and relieve symptoms. °· Help to reduce the spread of the virus to sexual partners. °· Limit the chance of future outbreaks, or make future outbreaks shorter. °· Lessen symptoms of future outbreaks. °Your health care provider may also recommend pain relief medicines, such as aspirin or ibuprofen. °Follow these instructions at home: °Sexual activity °· Do not have sexual contact during active outbreaks. °· Practice safe sex. Latex condoms and male condoms may help prevent the spread of the herpes virus. °General instructions °· Keep the affected areas dry and clean. °· Take over-the-counter and prescription medicines only as told by your health care provider. °· Avoid rubbing or touching blisters and sores. If you do touch blisters or sores: °? Wash your hands thoroughly with soap and water. °? Do not touch your eyes afterward. °· To help relieve pain or itching, you may take the following actions as directed by your health care provider: °? Apply a cold, wet cloth (cold compress) to affected areas 4-6 times a day. °? Apply a substance that protects your skin and reduces bleeding (astringent). °? Apply a gel that   helps relieve pain around sores (lidocaine gel). °? Take a warm, shallow bath that cleans the genital area (sitz bath). °· Keep all follow-up visits as told by your health care provider. This is important. °How is this prevented? °· Use condoms. Although anyone can get genital herpes during sexual contact, even with the use of a condom, a condom can provide some  protection. °· Avoid having multiple sexual partners. °· Talk with your sexual partner about any symptoms either of you may have. Also, talk with your partner about any history of STIs. °· Get tested for STIs before you have sex. Ask your partner to do the same. °· Do not have sexual contact if you have symptoms of genital herpes. °Contact a health care provider if: °· Your symptoms are not improving with medicine. °· Your symptoms return. °· You have new symptoms. °· You have a fever. °· You have abdominal pain. °· You have redness, swelling, or pain in your eye. °· You notice new sores on other parts of your body. °· You are a woman and experience bleeding between menstrual periods. °· You have had herpes and you become pregnant or plan to become pregnant. °Summary °· Genital herpes is a common sexually transmitted infection (STI) that is caused by the herpes simplex virus (HSV) type 1 or type 2. °· These viruses are most often spread through sexual contact with an infected person. °· You are more likely to develop this condition if you have sex with many partners or you have unprotected sex. °· Most people do not have symptoms (asymptomatic) or have mild symptoms that may be mistaken for other skin problems. Symptoms occur as outbreaks that may happen months or years apart. °· There is no cure for this condition, but treatment with oral antiviral medicines can reduce symptoms, reduce the chance of spreading the virus to a partner, prevent future outbreaks, or shorten future outbreaks. °This information is not intended to replace advice given to you by your health care provider. Make sure you discuss any questions you have with your health care provider. °Document Released: 05/13/2000 Document Revised: 04/15/2016 Document Reviewed: 04/15/2016 °Elsevier Interactive Patient Education © 2019 [employer] ° °

## 2018-08-14 NOTE — Telephone Encounter (Signed)
Call placed to the patient and completed [employer] application that was then faxed to [employer] eligibility.

## 2018-08-15 ENCOUNTER — Telehealth: Payer: Self-pay | Admitting: Family Medicine

## 2018-08-15 NOTE — Telephone Encounter (Signed)
Pt called in stating his insurance didn't cover his medication hydrocortisone-pramoxine (ANALPRAM HC) 2.5-1 % rectal cream  would like to know if there is something he can take in place of that  Please follow up

## 2018-08-20 ENCOUNTER — Telehealth: Payer: Self-pay

## 2018-08-20 MED ORDER — HYDROCORTISONE 2.5 % RE CREA: TOPICAL_CREAM | RECTAL | 0 refills | Status: AC

## 2018-08-20 NOTE — Telephone Encounter (Signed)
Medicaid will pay for hydrocortisone by itself but will not pay for it in combination with a topical anesthetic. Please send an alternate option if appropriate.

## 2018-08-20 NOTE — Telephone Encounter (Signed)
Call placed to Courtney with [employer] who confirmed receipt of the [employer]AT application.    

## 2018-08-27 ENCOUNTER — Other Ambulatory Visit: Payer: Self-pay

## 2018-08-27 ENCOUNTER — Ambulatory Visit (HOSPITAL_COMMUNITY)
Admission: EM | Admit: 2018-08-27 | Discharge: 2018-08-27 | Disposition: A | Discharge status: Home / Self Care | Payer: Medicare Other | Attending: Family Medicine | Admitting: Family Medicine

## 2018-08-27 ENCOUNTER — Encounter (HOSPITAL_COMMUNITY): Payer: Self-pay

## 2018-08-27 DIAGNOSIS — J0100 Acute maxillary sinusitis, unspecified: Secondary | ICD-10-CM | POA: Diagnosis not present

## 2018-08-27 MED ORDER — AMOXICILLIN-POT CLAVULANATE 875-125 MG PO TABS: 1.0000 | ORAL_TABLET | Freq: Two times a day (BID) | ORAL | 0 refills | Status: DC

## 2018-08-27 NOTE — ED Notes (Signed)
Patient verbalizes understanding of discharge instructions. Opportunity for questioning and answers were provided. Patient discharged from [facility] by RN.  

## 2018-08-27 NOTE — ED Triage Notes (Signed)
Patient presents to Urgent Care with complaints of continued sinus issues for several weeks, worse since the past few days. Patient states he also has a sore throat (cough drop in mouth during triage) and slight headache. Pt is concerned about COVID-19 r/t his HIV.

## 2018-08-27 NOTE — Discharge Instructions (Signed)
Continue with your allergy medications.  Complete course of antibiotics.  Push fluids to ensure adequate hydration and keep secretions thin.   If develop worsening of symptoms, fever, shortness of breath , difficulty breathing please return or go to the ER.

## 2018-08-27 NOTE — ED Provider Notes (Signed)
[facility]    CSN: [id number] Arrival date & time: 08/27/18  0806     History   Chief Complaint Chief Complaint  Patient presents with  . Recurrent Sinusitis  . Headache    HPI Benjamin Hunter is a 58 y.o. male.   Benjamin Hunter presents with complaints of persistent congestion. Has been present for at least a month, was seen here 3/1 with same complaints, patient states hasn't improved. He states he is concerned about Covid-19, bronchitis, or strep throat. No known ill contacts. No recent travel. No fevers. No gi/gu complaints. Cough is non productive. He felt shortness of breath last night, felt like his throat was swollen and painful. He has nasal drainage and facial pressure. He has been using allergy medication and nasal spray. Hx of HIV, DM, htn, arthritis. He is compliant with HIV management.     ROS per HPI, negative if not otherwise mentioned.      Past Medical History:  Diagnosis Date  . Arthritis   . Depression   . Diabetes mellitus   . HIV (human immunodeficiency virus infection) ([place])   . Hyperlipidemia   . Hypertension     Patient Active Problem List   Diagnosis Date Noted  . Upper respiratory infection 04/30/2018  . 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 Depression 01/14/2013  . Rash and nonspecific skin eruption 10/11/2012  . Recurrent low back pain 10/11/2012  . Left facial numbness 08/20/2012  . Seasonal allergies 09/20/2011  . Polyarthritis 09/06/2011  . Diabetes mellitus ([place]) 03/16/2009  . Human immunodeficiency virus (HIV) disease ([place]) 07/17/2008  . Hyperlipidemia 07/17/2008  . Hypertension 07/17/2008    History reviewed. No pertinent surgical history.     Home Medications    Prior to Admission medications   Medication Sig Start Date End Date Taking? Authorizing Provider  Alcohol Swabs PADS 1 each by Does not apply route 3 (three) times daily before meals. 11/16/16   Charlott Rakes, MD   amLODipine (NORVASC) 5 MG tablet TAKE 1 TABLET(5 MG) BY MOUTH DAILY 08/01/18   [name] Caldron M, PA-C  amoxicillin-clavulanate (AUGMENTIN) 875-125 MG tablet Take 1 tablet by mouth every 12 (twelve) hours for 10 days. 08/27/18 09/06/18  Zigmund Gottron, NP  atorvastatin (LIPITOR) 40 MG tablet Take 1 tablet (40 mg total) by mouth daily. 08/01/18   Argentina Donovan, PA-C  B-D UF III MINI PEN NEEDLES 31G X 5 MM MISC USE TO INJECT AT BEDTIME 06/21/18   Charlott Rakes, MD  BIKTARVY 50-200-25 MG TABS tablet TAKE 1 TABLET BY MOUTH DAILY 07/16/18   Michel Bickers, MD  Blood Glucose Monitoring Suppl [facility] VERIO) w/Device KIT USE AS DIRECTED 03/14/18   Charlott Rakes, MD  cetirizine (ZYRTEC) 10 MG tablet TAKE 1 TABLET(10 MG) BY MOUTH DAILY 10/09/17   Charlott Rakes, MD  ezetimibe (ZETIA) 10 MG tablet 1 daily 08/01/18   [name] Caldron M, PA-C  fenofibrate (TRICOR) 48 MG tablet 1 at bedtime 08/01/18   [name] Caldron M, PA-C  fluticasone (FLONASE) 50 MCG/ACT nasal spray Place 2 sprays into both nostrils daily. 08/01/18   Argentina Donovan, PA-C  glipiZIDE (GLUCOTROL) 10 MG tablet TAKE 1 TABLET(10 MG) BY MOUTH TWICE DAILY BEFORE A MEAL 08/01/18   McClung, Angela M, PA-C  glucose blood (ONETOUCH VERIO) test strip Use as instructed 03/14/18   Charlott Rakes, MD  hydrocortisone (ANUSOL-HC) 2.5 % rectal cream Apply to rectal hemorrhoid BID PRN 08/20/18   Ladell Pier, MD  hydrocortisone-pramoxine (ANALPRAM HC) 2.5-1 % rectal cream Place 1 application rectally 3 (three) times daily. 08/14/18   Charlott Rakes, MD  Insulin Glargine (BASAGLAR KWIKPEN) 100 UNIT/ML SOPN Inject 0.32 mLs (32 Units total) into the skin at bedtime. 08/01/18   Argentina Donovan, PA-C  Lancet Devices [facility]) lancets Use as instructed for 3 times daily testing of blood sugar. 12/07/15   Charlott Rakes, MD  ONETOUCH DELICA LANCETS 48J MISC USE AS DIRECTED 03/14/18   Charlott Rakes, MD  predniSONE (STERAPRED UNI-PAK 21 TAB) 10 MG (21)  TBPK tablet 6 tabs for 1 day, then 5 tabs for 1 das, then 4 tabs for 1 day, then 3 tabs for 1 day, 2 tabs for 1 day, then 1 tab for 1 day 08/06/18   Loura Halt A, NP  valACYclovir (VALTREX) 1000 MG tablet Take 1 tablet (1,000 mg total) by mouth 2 (two) times daily. 08/14/18   Charlott Rakes, MD    Family History Family History  Problem Relation Age of Onset  . Hypertension Mother   . Heart failure Father     Social History Social History   Tobacco Use  . Smoking status: Never Smoker  . Smokeless tobacco: Never Used  Substance Use Topics  . Alcohol use: Yes    Alcohol/week: 1.0 - 2.0 standard drinks    Types: 1 - 2 Cans of beer per week    Comment: occasional   . Drug use: No     Allergies   Shrimp [shellfish allergy] and Lisinopril   Review of Systems Review of Systems   Physical Exam Triage Vital Signs ED Triage Vitals  Enc Vitals Group     BP 08/27/18 0824 (!) 150/88     Pulse Rate 08/27/18 0824 98     Resp 08/27/18 0824 16     Temp 08/27/18 0824 98.7 F (37.1 C)     Temp Source 08/27/18 0824 Oral     SpO2 08/27/18 0824 100 %     Weight --      Height 08/27/18 0823 '5\' 11"'  (1.803 m)     Head Circumference --      Peak Flow --      Pain Score 08/27/18 0822 9     Pain Loc --      Pain Edu? --      Excl. in [place]? --    No data found.  Updated Vital Signs BP (!) 150/88 (BP Location: Right Arm)   Pulse 98   Temp 98.7 F (37.1 C) (Oral)   Resp 16   Ht '5\' 11"'  (1.803 m)   SpO2 100%   BMI 27.84 kg/m    Physical Exam Vitals signs reviewed.  Constitutional:      Appearance: He is well-developed.  HENT:     Head: Normocephalic and atraumatic.     Right Ear: Tympanic membrane, ear canal and external ear normal.     Left Ear: Tympanic membrane, ear canal and external ear normal.     Nose: Rhinorrhea present.     Right Sinus: Maxillary sinus tenderness present. No frontal sinus tenderness.     Left Sinus: Maxillary sinus tenderness present. No frontal  sinus tenderness.     Mouth/Throat:     Pharynx: Uvula midline.  Eyes:     Conjunctiva/sclera: Conjunctivae normal.     Pupils: Pupils are equal, round, and reactive to light.  Neck:     Musculoskeletal: Normal range of motion.  Cardiovascular:     Rate  and Rhythm: Normal rate and regular rhythm.  Pulmonary:     Effort: Pulmonary effort is normal.     Breath sounds: Normal breath sounds.  Lymphadenopathy:     Cervical: No cervical adenopathy.  Skin:    General: Skin is warm and dry.  Neurological:     Mental Status: He is alert and oriented to person, place, and time.      UC Treatments / Results  Labs (all labs ordered are listed, but only abnormal results are displayed) Labs Reviewed - No data to display  EKG None  Radiology No results found.  Procedures Procedures (including critical care time)  Medications Ordered in UC Medications - No data to display  Initial Impression / Assessment and Plan / UC Course  I have reviewed the triage vital signs and the nursing notes.  Pertinent labs & imaging results that were available during my care of the patient were reviewed by me and considered in my medical decision making (see chart for details).     Non toxic. Benign physical exam.  Sinusitis symptoms for 1 month. Afebrile. No increased work of breathing. Will treat sinusitis at this time. Return precautions provided. Patient verbalized understanding and agreeable to plan.    Final Clinical Impressions(s) / UC Diagnoses   Final diagnoses:  Acute non-recurrent maxillary sinusitis     Discharge Instructions     Continue with your allergy medications.  Complete course of antibiotics.  Push fluids to ensure adequate hydration and keep secretions thin.   If develop worsening of symptoms, fever, shortness of breath , difficulty breathing please return or go to the ER.    ED Prescriptions    Medication Sig Dispense Auth. Provider   amoxicillin-clavulanate  (AUGMENTIN) 875-125 MG tablet Take 1 tablet by mouth every 12 (twelve) hours for 10 days. 20 tablet Zigmund Gottron, NP     Controlled Substance Prescriptions [place] Controlled Substance Registry consulted? Not Applicable   Zigmund Gottron, NP 08/27/18 [phone]

## 2018-09-01 ENCOUNTER — Encounter (HOSPITAL_COMMUNITY): Payer: Self-pay

## 2018-09-01 ENCOUNTER — Other Ambulatory Visit: Payer: Self-pay

## 2018-09-01 ENCOUNTER — Ambulatory Visit (HOSPITAL_COMMUNITY)
Admission: EM | Admit: 2018-09-01 | Discharge: 2018-09-01 | Disposition: A | Discharge status: Home / Self Care | Payer: Medicare Other | Attending: Family Medicine | Admitting: Family Medicine

## 2018-09-01 DIAGNOSIS — J302 Other seasonal allergic rhinitis: Secondary | ICD-10-CM

## 2018-09-01 DIAGNOSIS — S86912D Strain of unspecified muscle(s) and tendon(s) at lower leg level, left leg, subsequent encounter: Secondary | ICD-10-CM | POA: Diagnosis not present

## 2018-09-01 MED ORDER — METHYLPREDNISOLONE 4 MG PO TABS: 4.0000 mg | ORAL_TABLET | Freq: Two times a day (BID) | ORAL | 1 refills | Status: DC

## 2018-09-01 NOTE — ED Triage Notes (Signed)
Pt presents with chronic knee pain and sinus issues.

## 2018-09-01 NOTE — ED Provider Notes (Addendum)
[place]    CSN: [id number] Arrival date & time: 09/01/18  1408     History   Chief Complaint Chief Complaint  Patient presents with  . Sinus  . Knee Pain    Left    HPI Benjamin Hunter is a 58 y.o. male.   58 yo man who has been here several times over the past month.  He says that the steroids that were given to him previously really helped his knee but that he reinjured it recently when he moved awkwardly.  He has had some scant green nasal discharge but no fever.  He has had no epistaxis.  Patient's had no cough or fever.  He is HIV positive and is very worried about getting coronavirus  He has been wearing a knee brace for the left knee.  He was under the impression that he had a fracture there.   Note from 08/27/2018: Benjamin Hunter presents with complaints of persistent congestion. Has been present for at least a month, was seen here 3/1 with same complaints, patient states hasn't improved. He states he is concerned about Covid-19, bronchitis, or strep throat. No known ill contacts. No recent travel. No fevers. No gi/gu complaints. Cough is non productive. He felt shortness of breath last night, felt like his throat was swollen and painful. He has nasal drainage and facial pressure. He has been using allergy medication and nasal spray. Hx of HIV, DM, htn, arthritis. He is compliant with HIV management.       Past Medical History:  Diagnosis Date  . Arthritis   . Depression   . Diabetes mellitus   . HIV (human immunodeficiency virus infection) ([place])   . Hyperlipidemia   . Hypertension     Patient Active Problem List   Diagnosis Date Noted  . Upper respiratory infection 04/30/2018  . 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 Depression 01/14/2013  . Rash and nonspecific skin eruption 10/11/2012  . Recurrent low back pain 10/11/2012  . Left facial numbness 08/20/2012  . Seasonal allergies 09/20/2011  . Polyarthritis  09/06/2011  . Diabetes mellitus ([place]) 03/16/2009  . Human immunodeficiency virus (HIV) disease ([place]) 07/17/2008  . Hyperlipidemia 07/17/2008  . Hypertension 07/17/2008    History reviewed. No pertinent surgical history.     Home Medications    Prior to Admission medications   Medication Sig Start Date End Date Taking? Authorizing Provider  Alcohol Swabs PADS 1 each by Does not apply route 3 (three) times daily before meals. 11/16/16   Charlott Rakes, MD  amLODipine (NORVASC) 5 MG tablet TAKE 1 TABLET(5 MG) BY MOUTH DAILY 08/01/18   [name] Caldron M, PA-C  atorvastatin (LIPITOR) 40 MG tablet Take 1 tablet (40 mg total) by mouth daily. 08/01/18   Argentina Donovan, PA-C  B-D UF III MINI PEN NEEDLES 31G X 5 MM MISC USE TO INJECT AT BEDTIME 06/21/18   Charlott Rakes, MD  BIKTARVY 50-200-25 MG TABS tablet TAKE 1 TABLET BY MOUTH DAILY 07/16/18   Michel Bickers, MD  Blood Glucose Monitoring Suppl [facility] VERIO) w/Device KIT USE AS DIRECTED 03/14/18   Charlott Rakes, MD  cetirizine (ZYRTEC) 10 MG tablet TAKE 1 TABLET(10 MG) BY MOUTH DAILY 10/09/17   Charlott Rakes, MD  ezetimibe (ZETIA) 10 MG tablet 1 daily 08/01/18   [name] Caldron M, PA-C  fenofibrate (TRICOR) 48 MG tablet 1 at bedtime 08/01/18   [name] Caldron M, PA-C  fluticasone (FLONASE) 50 MCG/ACT nasal spray Place  2 sprays into both nostrils daily. 08/01/18   Argentina Donovan, PA-C  glipiZIDE (GLUCOTROL) 10 MG tablet TAKE 1 TABLET(10 MG) BY MOUTH TWICE DAILY BEFORE A MEAL 08/01/18   McClung, Angela M, PA-C  glucose blood (ONETOUCH VERIO) test strip Use as instructed 03/14/18   Charlott Rakes, MD  hydrocortisone (ANUSOL-HC) 2.5 % rectal cream Apply to rectal hemorrhoid BID PRN 08/20/18   Ladell Pier, MD  hydrocortisone-pramoxine (ANALPRAM HC) 2.5-1 % rectal cream Place 1 application rectally 3 (three) times daily. 08/14/18   Charlott Rakes, MD  Insulin Glargine (BASAGLAR KWIKPEN) 100 UNIT/ML SOPN Inject 0.32 mLs (32 Units total) into  the skin at bedtime. 08/01/18   Argentina Donovan, PA-C  Lancet Devices [facility]) lancets Use as instructed for 3 times daily testing of blood sugar. 12/07/15   Charlott Rakes, MD  methylPREDNISolone (MEDROL) 4 MG tablet Take 1 tablet (4 mg total) by mouth 2 (two) times daily. 09/01/18   Robyn Haber, MD  [facility] DELICA LANCETS 56C MISC USE AS DIRECTED 03/14/18   Charlott Rakes, MD  valACYclovir (VALTREX) 1000 MG tablet Take 1 tablet (1,000 mg total) by mouth 2 (two) times daily. 08/14/18   Charlott Rakes, MD    Family History Family History  Problem Relation Age of Onset  . Hypertension Mother   . Heart failure Father     Social History Social History   Tobacco Use  . Smoking status: Never Smoker  . Smokeless tobacco: Never Used  Substance Use Topics  . Alcohol use: Yes    Alcohol/week: 1.0 - 2.0 standard drinks    Types: 1 - 2 Cans of beer per week    Comment: occasional   . Drug use: No     Allergies   Shrimp [shellfish allergy] and Lisinopril   Review of Systems Review of Systems  HENT: Positive for congestion.   Musculoskeletal: Positive for gait problem. Negative for joint swelling.  All other systems reviewed and are negative.    Physical Exam Triage Vital Signs ED Triage Vitals  Enc Vitals Group     BP 09/01/18 1417 (!) 144/72     Pulse Rate 09/01/18 1417 95     Resp 09/01/18 1417 18     Temp 09/01/18 1417 98.6 F (37 C)     Temp Source 09/01/18 1417 Oral     SpO2 09/01/18 1417 100 %     Weight --      Height --      Head Circumference --      Peak Flow --      Pain Score 09/01/18 1419 5     Pain Loc --      Pain Edu? --      Excl. in [place]? --    No data found.  Updated Vital Signs BP (!) 144/72 (BP Location: Left Arm)   Pulse 95   Temp 98.6 F (37 C) (Oral)   Resp 18   SpO2 100%    Physical Exam Vitals signs and nursing note reviewed.  Constitutional:      Appearance: Normal appearance. He is normal weight.  HENT:      Head: Normocephalic and atraumatic.     Nose: Nose normal.     Mouth/Throat:     Mouth: Mucous membranes are moist.  Eyes:     Pupils: Pupils are equal, round, and reactive to light.  Cardiovascular:     Rate and Rhythm: Normal rate and regular rhythm.  Pulses: Normal pulses.     Heart sounds: Normal heart sounds.  Pulmonary:     Effort: Pulmonary effort is normal.     Breath sounds: Normal breath sounds.  Musculoskeletal: Normal range of motion.        General: Tenderness and signs of injury present. No swelling or deformity.     Comments: Mild tenderness, medial joint line of left knee  Skin:    General: Skin is warm and dry.  Neurological:     General: No focal deficit present.     Mental Status: He is alert and oriented to person, place, and time.    Exercise were demonstrated to help patient build knee stability, x-ray reviewed with patient showing that he did not have a fracture. UC Treatments / Results  Labs (all labs ordered are listed, but only abnormal results are displayed) Labs Reviewed - No data to display  EKG None  Radiology No results found.  Procedures Procedures (including critical care time)  Medications Ordered in UC Medications - No data to display  Initial Impression / Assessment and Plan / UC Course  I have reviewed the triage vital signs and the nursing notes.  Pertinent labs & imaging results that were available during my care of the patient were reviewed by me and considered in my medical decision making (see chart for details).    Final Clinical Impressions(s) / UC Diagnoses   Final diagnoses:  Strain of left knee, subsequent encounter  Seasonal allergies   Discharge Instructions   None    ED Prescriptions    Medication Sig Dispense Auth. Provider   methylPREDNISolone (MEDROL) 4 MG tablet Take 1 tablet (4 mg total) by mouth 2 (two) times daily. 10 tablet Robyn Haber, MD     Controlled Substance Prescriptions  [place]  Controlled Substance Registry consulted? Not Applicable   Robyn Haber, MD 09/01/18 1448    Robyn Haber, MD 09/01/18 [phone]

## 2018-09-04 ENCOUNTER — Encounter: Payer: Self-pay | Admitting: Internal Medicine

## 2018-09-08 ENCOUNTER — Other Ambulatory Visit: Payer: Self-pay

## 2018-09-08 ENCOUNTER — Ambulatory Visit (HOSPITAL_COMMUNITY)
Admission: EM | Admit: 2018-09-08 | Discharge: 2018-09-08 | Disposition: A | Discharge status: Home / Self Care | Payer: Medicare Other | Attending: Family Medicine | Admitting: Family Medicine

## 2018-09-08 ENCOUNTER — Encounter (HOSPITAL_COMMUNITY): Payer: Self-pay | Admitting: Family Medicine

## 2018-09-08 DIAGNOSIS — R739 Hyperglycemia, unspecified: Secondary | ICD-10-CM

## 2018-09-08 DIAGNOSIS — J302 Other seasonal allergic rhinitis: Secondary | ICD-10-CM | POA: Diagnosis not present

## 2018-09-08 DIAGNOSIS — E119 Type 2 diabetes mellitus without complications: Secondary | ICD-10-CM

## 2018-09-08 DIAGNOSIS — M1712 Unilateral primary osteoarthritis, left knee: Secondary | ICD-10-CM

## 2018-09-08 DIAGNOSIS — Z794 Long term (current) use of insulin: Secondary | ICD-10-CM | POA: Diagnosis not present

## 2018-09-08 LAB — GLUCOSE, CAPILLARY: Glucose-Capillary: 258 mg/dL — ABNORMAL HIGH (ref 70–99)

## 2018-09-08 MED ORDER — DICLOFENAC SODIUM 1 % TD GEL: 2.0000 g | Freq: Four times a day (QID) | TRANSDERMAL | 2 refills | Status: AC

## 2018-09-08 NOTE — ED Notes (Signed)
Patient verbalizes understanding of discharge instructions. Opportunity for questioning and answers were provided. Patient discharged from [facility] by MD. 

## 2018-09-08 NOTE — Discharge Instructions (Addendum)
Stop the steroid for now Keep your same dose of insulin for now. Aspirin is ok 2% milk is better with cereal Limit bread, pasta, rice, potatoes.

## 2018-09-08 NOTE — ED Triage Notes (Signed)
Patient presents to Urgent Care with complaints of high blood sugar since being prescribed a steroid for his allergies and for his knee. Patient states he was more worried about his hyperglycemia than his sinuses so he stopped taking the steroid but his glucose is still elevated, running about 300.

## 2018-09-08 NOTE — ED Provider Notes (Signed)
[place]    CSN: [id number] Arrival date & time: 09/08/18  1616     History   Chief Complaint Chief Complaint  Patient presents with  . Hyperglycemia    HPI Benjamin Hunter is a 58 y.o. male.   58 yo man recently seen for knee sprain and given steroids as treatment.  He is having difficulty controlling sugars since.  His blood sugars have been in the low 300's at times.  Allergies and knee are much better.  He's not having polyuria, polydipsia, blurry vision or dizziness.     Past Medical History:  Diagnosis Date  . Arthritis   . Depression   . Diabetes mellitus   . HIV (human immunodeficiency virus infection) ([place])   . Hyperlipidemia   . Hypertension     Patient Active Problem List   Diagnosis Date Noted  . Upper respiratory infection 04/30/2018  . 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 Depression 01/14/2013  . Rash and nonspecific skin eruption 10/11/2012  . Recurrent low back pain 10/11/2012  . Left facial numbness 08/20/2012  . Seasonal allergies 09/20/2011  . Polyarthritis 09/06/2011  . Diabetes mellitus ([place]) 03/16/2009  . Human immunodeficiency virus (HIV) disease ([place]) 07/17/2008  . Hyperlipidemia 07/17/2008  . Hypertension 07/17/2008    History reviewed. No pertinent surgical history.     Home Medications    Prior to Admission medications   Medication Sig Start Date End Date Taking? Authorizing Provider  Alcohol Swabs PADS 1 each by Does not apply route 3 (three) times daily before meals. 11/16/16   Charlott Rakes, MD  amLODipine (NORVASC) 5 MG tablet TAKE 1 TABLET(5 MG) BY MOUTH DAILY 08/01/18   [name] Caldron M, PA-C  atorvastatin (LIPITOR) 40 MG tablet Take 1 tablet (40 mg total) by mouth daily. 08/01/18   Argentina Donovan, PA-C  B-D UF III MINI PEN NEEDLES 31G X 5 MM MISC USE TO INJECT AT BEDTIME 06/21/18   Charlott Rakes, MD  BIKTARVY 50-200-25 MG TABS tablet TAKE 1 TABLET BY MOUTH DAILY 07/16/18    Michel Bickers, MD  Blood Glucose Monitoring Suppl [facility] VERIO) w/Device KIT USE AS DIRECTED 03/14/18   Charlott Rakes, MD  cetirizine (ZYRTEC) 10 MG tablet TAKE 1 TABLET(10 MG) BY MOUTH DAILY 10/09/17   Charlott Rakes, MD  diclofenac sodium (VOLTAREN) 1 % GEL Apply 2 g topically 4 (four) times daily. 09/08/18   Robyn Haber, MD  ezetimibe (ZETIA) 10 MG tablet 1 daily 08/01/18   [name] Caldron M, PA-C  fenofibrate (TRICOR) 48 MG tablet 1 at bedtime 08/01/18   [name] Caldron M, PA-C  fluticasone [facility]) 50 MCG/ACT nasal spray Place 2 sprays into both nostrils daily. 08/01/18   Argentina Donovan, PA-C  glipiZIDE (GLUCOTROL) 10 MG tablet TAKE 1 TABLET(10 MG) BY MOUTH TWICE DAILY BEFORE A MEAL 08/01/18   McClung, Angela M, PA-C  glucose blood (ONETOUCH VERIO) test strip Use as instructed 03/14/18   Charlott Rakes, MD  hydrocortisone (ANUSOL-HC) 2.5 % rectal cream Apply to rectal hemorrhoid BID PRN 08/20/18   Ladell Pier, MD  hydrocortisone-pramoxine (ANALPRAM HC) 2.5-1 % rectal cream Place 1 application rectally 3 (three) times daily. 08/14/18   Charlott Rakes, MD  Insulin Glargine (BASAGLAR KWIKPEN) 100 UNIT/ML SOPN Inject 0.32 mLs (32 Units total) into the skin at bedtime. 08/01/18   Argentina Donovan, PA-C  Lancet Devices [facility]) lancets Use as instructed for 3 times daily testing of blood sugar. 12/07/15  Charlott Rakes, MD  methylPREDNISolone (MEDROL) 4 MG tablet Take 1 tablet (4 mg total) by mouth 2 (two) times daily. 09/01/18   Robyn Haber, MD  [facility] DELICA LANCETS 95M MISC USE AS DIRECTED 03/14/18   Charlott Rakes, MD  valACYclovir (VALTREX) 1000 MG tablet Take 1 tablet (1,000 mg total) by mouth 2 (two) times daily. 08/14/18   Charlott Rakes, MD    Family History Family History  Problem Relation Age of Onset  . Hypertension Mother   . Heart failure Father     Social History Social History   Tobacco Use  . Smoking status: Never Smoker  . Smokeless  tobacco: Never Used  Substance Use Topics  . Alcohol use: Yes    Alcohol/week: 1.0 - 2.0 standard drinks    Types: 1 - 2 Cans of beer per week    Comment: occasional   . Drug use: No     Allergies   Shrimp [shellfish allergy] and Lisinopril   Review of Systems Review of Systems  All other systems reviewed and are negative.    Physical Exam Triage Vital Signs ED Triage Vitals  Enc Vitals Group     BP      Pulse      Resp      Temp      Temp src      SpO2      Weight      Height      Head Circumference      Peak Flow      Pain Score      Pain Loc      Pain Edu?      Excl. in [place]?    No data found.  Updated Vital Signs BP 119/67 (BP Location: Left Arm)   Pulse 86   Temp 98.1 F (36.7 C) (Oral)   Resp 18   SpO2 100%    Physical Exam   UC Treatments / Results  Labs (all labs ordered are listed, but only abnormal results are displayed) Labs Reviewed  GLUCOSE, CAPILLARY - Abnormal; Notable for the following components:      Result Value   Glucose-Capillary 258 (*)    All other components within normal limits  CBG MONITORING, ED    EKG None  Radiology No results found.  Procedures Procedures (including critical care time)  Medications Ordered in UC Medications - No data to display  Initial Impression / Assessment and Plan / UC Course  I have reviewed the triage vital signs and the nursing notes.  Pertinent labs & imaging results that were available during my care of the patient were reviewed by me and considered in my medical decision making (see chart for details).    Final Clinical Impressions(s) / UC Diagnoses   Final diagnoses:  Hyperglycemia  Arthritis of left knee  Seasonal allergies     Discharge Instructions     Stop the steroid for now Keep your same dose of insulin for now. Aspirin is ok 2% milk is better with cereal Limit bread, pasta, rice, potatoes.     ED Prescriptions    Medication Sig Dispense Auth. Provider    diclofenac sodium (VOLTAREN) 1 % GEL Apply 2 g topically 4 (four) times daily. 100 g Robyn Haber, MD     Controlled Substance Prescriptions [place] Controlled Substance Registry consulted? Not Applicable   Robyn Haber, MD 09/08/18 [phone]

## 2018-09-28 ENCOUNTER — Other Ambulatory Visit: Payer: Self-pay | Admitting: Family Medicine

## 2018-09-28 DIAGNOSIS — A601 Herpesviral infection of perianal skin and rectum: Secondary | ICD-10-CM

## 2018-10-04 DIAGNOSIS — Z20828 Contact with and (suspected) exposure to other viral communicable diseases: Secondary | ICD-10-CM | POA: Diagnosis not present

## 2018-10-14 ENCOUNTER — Ambulatory Visit (HOSPITAL_COMMUNITY)
Admission: EM | Admit: 2018-10-14 | Discharge: 2018-10-14 | Disposition: A | Discharge status: Home / Self Care | Payer: Medicare Other | Attending: Emergency Medicine | Admitting: Emergency Medicine

## 2018-10-14 ENCOUNTER — Other Ambulatory Visit: Payer: Self-pay

## 2018-10-14 ENCOUNTER — Encounter (HOSPITAL_COMMUNITY): Payer: Self-pay

## 2018-10-14 DIAGNOSIS — J029 Acute pharyngitis, unspecified: Secondary | ICD-10-CM | POA: Insufficient documentation

## 2018-10-14 LAB — POCT RAPID STREP A: Streptococcus, Group A Screen (Direct): NEGATIVE

## 2018-10-14 NOTE — ED Provider Notes (Signed)
[facility]    CSN: [id number] Arrival date & time: 10/14/18  1426     History   Chief Complaint Chief Complaint  Patient presents with  . Sore Throat    HPI Benjamin Hunter is a 58 y.o. male history of hypertension, hyperlipidemia, HIV, DM type II, arthritis presenting today for evaluation of a sore throat.  Patient has noted a discomfort to his right side of his throat for the past 3 days.  Today it is slightly better.  He denies any fevers, chills or body aches.  He denies associated rhinorrhea.  He has had a mild cough.  He has been using his allergy medicine and taking daily Zyrtec, Flonase as well as a cough syrup.  Last week tested negative for COVID.  Symptoms have not changed significantly since.  Does admit to possible secondhand smoke exposure, denies personal tobacco use.  Denies history of asthma.  Denies chest pain or shortness of breath.  HPI  Past Medical History:  Diagnosis Date  . Arthritis   . Depression   . Diabetes mellitus   . HIV (human immunodeficiency virus infection) ([place])   . Hyperlipidemia   . Hypertension     Patient Active Problem List   Diagnosis Date Noted  . Upper respiratory infection 04/30/2018  . 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 Depression 01/14/2013  . Rash and nonspecific skin eruption 10/11/2012  . Recurrent low back pain 10/11/2012  . Left facial numbness 08/20/2012  . Seasonal allergies 09/20/2011  . Polyarthritis 09/06/2011  . Diabetes mellitus ([place]) 03/16/2009  . Human immunodeficiency virus (HIV) disease ([place]) 07/17/2008  . Hyperlipidemia 07/17/2008  . Hypertension 07/17/2008    History reviewed. No pertinent surgical history.     Home Medications    Prior to Admission medications   Medication Sig Start Date End Date Taking? Authorizing Provider  Alcohol Swabs PADS 1 each by Does not apply route 3 (three) times daily before meals. 11/16/16   Charlott Rakes, MD  amLODipine  (NORVASC) 5 MG tablet TAKE 1 TABLET(5 MG) BY MOUTH DAILY 08/01/18   [name] Caldron M, PA-C  atorvastatin (LIPITOR) 40 MG tablet Take 1 tablet (40 mg total) by mouth daily. 08/01/18   Argentina Donovan, PA-C  B-D UF III MINI PEN NEEDLES 31G X 5 MM MISC USE TO INJECT AT BEDTIME 06/21/18   Charlott Rakes, MD  BIKTARVY 50-200-25 MG TABS tablet TAKE 1 TABLET BY MOUTH DAILY 07/16/18   Michel Bickers, MD  Blood Glucose Monitoring Suppl [facility] VERIO) w/Device KIT USE AS DIRECTED 03/14/18   Charlott Rakes, MD  cetirizine (ZYRTEC) 10 MG tablet TAKE 1 TABLET(10 MG) BY MOUTH DAILY 10/09/17   Charlott Rakes, MD  diclofenac sodium (VOLTAREN) 1 % GEL Apply 2 g topically 4 (four) times daily. 09/08/18   Robyn Haber, MD  ezetimibe (ZETIA) 10 MG tablet 1 daily 08/01/18   [name] Caldron M, PA-C  fenofibrate (TRICOR) 48 MG tablet 1 at bedtime 08/01/18   [name] Caldron M, PA-C  fluticasone [facility]) 50 MCG/ACT nasal spray Place 2 sprays into both nostrils daily. 08/01/18   Argentina Donovan, PA-C  glipiZIDE (GLUCOTROL) 10 MG tablet TAKE 1 TABLET(10 MG) BY MOUTH TWICE DAILY BEFORE A MEAL 08/01/18   McClung, Angela M, PA-C  glucose blood (ONETOUCH VERIO) test strip Use as instructed 03/14/18   Charlott Rakes, MD  hydrocortisone (ANUSOL-HC) 2.5 % rectal cream Apply to rectal hemorrhoid BID PRN 08/20/18   Ladell Pier, MD  hydrocortisone-pramoxine (ANALPRAM HC) 2.5-1 % rectal cream Place 1 application rectally 3 (three) times daily. 08/14/18   Charlott Rakes, MD  Insulin Glargine (BASAGLAR KWIKPEN) 100 UNIT/ML SOPN Inject 0.32 mLs (32 Units total) into the skin at bedtime. 08/01/18   Argentina Donovan, PA-C  Lancet Devices [facility]) lancets Use as instructed for 3 times daily testing of blood sugar. 12/07/15   Charlott Rakes, MD  methylPREDNISolone (MEDROL) 4 MG tablet Take 1 tablet (4 mg total) by mouth 2 (two) times daily. 09/01/18   Robyn Haber, MD  [facility] DELICA LANCETS 47M MISC USE AS DIRECTED  03/14/18   Charlott Rakes, MD  valACYclovir (VALTREX) 1000 MG tablet TAKE 1 TABLET(1000 MG) BY MOUTH TWICE DAILY 10/01/18   Charlott Rakes, MD    Family History Family History  Problem Relation Age of Onset  . Hypertension Mother   . Heart failure Father     Social History Social History   Tobacco Use  . Smoking status: Never Smoker  . Smokeless tobacco: Never Used  Substance Use Topics  . Alcohol use: Yes    Alcohol/week: 1.0 - 2.0 standard drinks    Types: 1 - 2 Cans of beer per week    Comment: occasional   . Drug use: No     Allergies   Shrimp [shellfish allergy] and Lisinopril   Review of Systems Review of Systems  Constitutional: Negative for activity change, appetite change, chills, fatigue and fever.  HENT: Positive for congestion and sore throat. Negative for ear pain, rhinorrhea, sinus pressure and trouble swallowing.   Eyes: Negative for discharge and redness.  Respiratory: Positive for cough. Negative for chest tightness and shortness of breath.   Cardiovascular: Negative for chest pain.  Gastrointestinal: Negative for abdominal pain, diarrhea, nausea and vomiting.  Musculoskeletal: Negative for myalgias.  Skin: Negative for rash.  Neurological: Negative for dizziness, light-headedness and headaches.     Physical Exam Triage Vital Signs ED Triage Vitals  Enc Vitals Group     BP 10/14/18 1446 124/73     Pulse Rate 10/14/18 1446 87     Resp 10/14/18 1446 12     Temp 10/14/18 1446 98.7 F (37.1 C)     Temp Source 10/14/18 1446 Oral     SpO2 10/14/18 1446 100 %     Weight 10/14/18 1510 198 lb (89.8 kg)     Height --      Head Circumference --      Peak Flow --      Pain Score 10/14/18 1507 2     Pain Loc --      Pain Edu? --      Excl. in [place]? --    No data found.  Updated Vital Signs BP 124/73 (BP Location: Left Arm)   Pulse 87   Temp 98.7 F (37.1 C) (Oral)   Resp 12   Wt 198 lb (89.8 kg)   SpO2 100%   BMI 27.62 kg/m   Visual  Acuity Right Eye Distance:   Left Eye Distance:   Bilateral Distance:    Right Eye Near:   Left Eye Near:    Bilateral Near:     Physical Exam Vitals signs and nursing note reviewed.  Constitutional:      Appearance: He is well-developed.  HENT:     Head: Normocephalic and atraumatic.     Ears:     Comments: Bilateral ears without tenderness to palpation of external auricle, tragus and mastoid, EAC's without erythema  or swelling, TM's with good bony landmarks and cone of light. Non erythematous.     Mouth/Throat:     Comments: Oral mucosa pink and moist, no tonsillar enlargement or exudate. Posterior pharynx patent and nonerythematous, no uvula deviation or swelling. Normal phonation.  Eyes:     Conjunctiva/sclera: Conjunctivae normal.  Neck:     Musculoskeletal: Neck supple.     Comments: No overlying neck swelling or erythema, no palpable lymphadenopathy, full active range of motion Cardiovascular:     Rate and Rhythm: Normal rate and regular rhythm.     Heart sounds: No murmur.  Pulmonary:     Effort: Pulmonary effort is normal. No respiratory distress.     Breath sounds: Normal breath sounds.     Comments: Breathing comfortably at rest, CTABL, no wheezing, rales or other adventitious sounds auscultated Abdominal:     Palpations: Abdomen is soft.     Tenderness: There is no abdominal tenderness.  Skin:    General: Skin is warm and dry.  Neurological:     Mental Status: He is alert.      UC Treatments / Results  Labs (all labs ordered are listed, but only abnormal results are displayed) Labs Reviewed  CULTURE, GROUP A STREP [facility])  POCT RAPID STREP A    EKG None  Radiology No results found.  Procedures Procedures (including critical care time)  Medications Ordered in UC Medications - No data to display  Initial Impression / Assessment and Plan / UC Course  I have reviewed the triage vital signs and the nursing notes.  Pertinent labs & imaging  results that were available during my care of the patient were reviewed by me and considered in my medical decision making (see chart for details).     58 year old male with mild sore throat.  Mouth exam normal, not suggestive of tonsillitis.  Strep test negative.  Most likely viral or related to postnasal drainage.  Will recommend continued symptomatic and supportive care.  Continue daily cetirizine/Zyrtec to help with any drainage.  Tylenol as needed.  Drink plenty of fluids, may try honey and hot tea.Discussed strict return precautions. Patient verbalized understanding and is agreeable with plan.  Final Clinical Impressions(s) / UC Diagnoses   Final diagnoses:  Sore throat     Discharge Instructions     Sore Throat  Your rapid strep tested Negative today. We will send for a culture and call in about 2 days if results are positive. Sore throat is most likely viral or related to drainage/mucous in throat.   Please continue Tylenol or Ibuprofen for fever and pain. May try salt water gargles, cepacol lozenges, throat spray, or OTC cold relief medicine for throat discomfort. If you also have congestion take a daily anti-histamine like Zyrtec, Claritin, and a oral decongestant to help with post nasal drip that may be irritating your throat.   Stay hydrated and drink plenty of fluids to keep your throat coated relieve irritation.     ED Prescriptions    None     Controlled Substance Prescriptions [place] Controlled Substance Registry consulted? Not Applicable   Janith [place], [place] 10/14/18 1527

## 2018-10-14 NOTE — Discharge Instructions (Signed)
Sore Throat  Your rapid strep tested Negative today. We will send for a culture and call in about 2 days if results are positive. Sore throat is most likely viral or related to drainage/mucous in throat.   Please continue Tylenol or Ibuprofen for fever and pain. May try salt water gargles, cepacol lozenges, throat spray, or OTC cold relief medicine for throat discomfort. If you also have congestion take a daily anti-histamine like Zyrtec, Claritin, and a oral decongestant to help with post nasal drip that may be irritating your throat.   Stay hydrated and drink plenty of fluids to keep your throat coated relieve irritation.

## 2018-10-14 NOTE — ED Triage Notes (Signed)
Pt states he has a sore throat that has gotten better. It had been sore for 3 days but better today.

## 2018-10-17 LAB — CULTURE, GROUP A STREP (THRC)

## 2018-11-13 ENCOUNTER — Other Ambulatory Visit: Payer: Self-pay

## 2018-11-13 ENCOUNTER — Encounter: Payer: Self-pay | Admitting: Family Medicine

## 2018-11-13 ENCOUNTER — Ambulatory Visit: Payer: Medicare Other | Attending: Family Medicine | Admitting: Family Medicine

## 2018-11-13 VITALS — BP 142/82 | HR 68 | Temp 97.8°F | Ht 71.0 in | Wt 210.0 lb

## 2018-11-13 DIAGNOSIS — J302 Other seasonal allergic rhinitis: Secondary | ICD-10-CM | POA: Insufficient documentation

## 2018-11-13 DIAGNOSIS — Z8249 Family history of ischemic heart disease and other diseases of the circulatory system: Secondary | ICD-10-CM | POA: Diagnosis not present

## 2018-11-13 DIAGNOSIS — Z21 Asymptomatic human immunodeficiency virus [HIV] infection status: Secondary | ICD-10-CM | POA: Insufficient documentation

## 2018-11-13 DIAGNOSIS — Z888 Allergy status to other drugs, medicaments and biological substances status: Secondary | ICD-10-CM | POA: Insufficient documentation

## 2018-11-13 DIAGNOSIS — E785 Hyperlipidemia, unspecified: Secondary | ICD-10-CM | POA: Diagnosis not present

## 2018-11-13 DIAGNOSIS — Z794 Long term (current) use of insulin: Secondary | ICD-10-CM | POA: Diagnosis not present

## 2018-11-13 DIAGNOSIS — Z79899 Other long term (current) drug therapy: Secondary | ICD-10-CM | POA: Diagnosis not present

## 2018-11-13 DIAGNOSIS — E1165 Type 2 diabetes mellitus with hyperglycemia: Secondary | ICD-10-CM | POA: Insufficient documentation

## 2018-11-13 DIAGNOSIS — Z0000 Encounter for general adult medical examination without abnormal findings: Secondary | ICD-10-CM

## 2018-11-13 DIAGNOSIS — R0982 Postnasal drip: Secondary | ICD-10-CM | POA: Insufficient documentation

## 2018-11-13 DIAGNOSIS — E7800 Pure hypercholesterolemia, unspecified: Secondary | ICD-10-CM | POA: Insufficient documentation

## 2018-11-13 DIAGNOSIS — I10 Essential (primary) hypertension: Secondary | ICD-10-CM | POA: Insufficient documentation

## 2018-11-13 DIAGNOSIS — Z7951 Long term (current) use of inhaled steroids: Secondary | ICD-10-CM | POA: Insufficient documentation

## 2018-11-13 LAB — GLUCOSE, POCT (MANUAL RESULT ENTRY): POC Glucose: 189 mg/dl — AB (ref 70–99)

## 2018-11-13 LAB — POCT GLYCOSYLATED HEMOGLOBIN (HGB A1C): HbA1c, POC (controlled diabetic range): 8.1 % — AB (ref 0.0–7.0)

## 2018-11-13 MED ORDER — FENOFIBRATE 48 MG PO TABS: ORAL_TABLET | ORAL | 1 refills | Status: DC

## 2018-11-13 MED ORDER — EZETIMIBE 10 MG PO TABS: ORAL_TABLET | ORAL | 1 refills | Status: DC

## 2018-11-13 MED ORDER — ALCOHOL SWABS PADS: 1.0000 | MEDICATED_PAD | Freq: Three times a day (TID) | 11 refills | Status: AC

## 2018-11-13 MED ORDER — FLUTICASONE PROPIONATE 50 MCG/ACT NA SUSP: 2.0000 | Freq: Every day | NASAL | 3 refills | Status: DC

## 2018-11-13 MED ORDER — CETIRIZINE HCL 10 MG PO TABS: ORAL_TABLET | ORAL | 1 refills | Status: DC

## 2018-11-13 MED ORDER — ATORVASTATIN CALCIUM 40 MG PO TABS: 40.0000 mg | ORAL_TABLET | Freq: Every day | ORAL | 1 refills | Status: DC

## 2018-11-13 MED ORDER — AMLODIPINE BESYLATE 5 MG PO TABS: ORAL_TABLET | ORAL | 1 refills | Status: DC

## 2018-11-13 MED ORDER — BASAGLAR KWIKPEN 100 UNIT/ML ~~LOC~~ SOPN: 35.0000 [IU] | PEN_INJECTOR | Freq: Every day | SUBCUTANEOUS | 2 refills | Status: DC

## 2018-11-13 MED ORDER — GLIPIZIDE 10 MG PO TABS: ORAL_TABLET | ORAL | 1 refills | Status: AC

## 2018-11-13 NOTE — Patient Instructions (Signed)

## 2018-11-13 NOTE — Progress Notes (Signed)
Subjective:  Patient ID: Benjamin Hunter, male    DOB: 06/03/1960  Age: 58 y.o. MRN: [id number]  CC: Diabetes   HPI Franko Hilliker is a 58 year old male with a Past medical history of HIV (CD4 count of 480 and 03/2018), type 2 diabetes mellitus (A1c 8.1), hypertension who presents today for follow-up visit. His A1c is 8.1 which is down from 9.1 previously he endorses compliance with his medications.  He is concerned about his allergies as he has had a couple of ED visits due to this and has remained compliant with Zyrtec and Flonase with no much improvement.  He endorses postnasal drip, nasal congestion and at the time of his urgent care presentation he did have a sore throat.  Denies fever, myalgias, facial tenderness, rash.  With regards to his diabetes mellitus he endorses compliance with his medications and denies hypoglycemia, numbness in his extremities or visual concerns.  He is due for an annual eye exam and his ophthalmologist is Dr. Einar Gip.  He promises to make an appointment with him. He is due for colonoscopy which he is unable to afford due to the high co-pay.  Stool FIT test from 11/2017 was negative. He has no additional concerns today.  Past Medical History:  Diagnosis Date  . Arthritis   . Depression   . Diabetes mellitus   . HIV (human immunodeficiency virus infection) ([place])   . Hyperlipidemia   . Hypertension     History reviewed. No pertinent surgical history.  Family History  Problem Relation Age of Onset  . Hypertension Mother   . Heart failure Father     Allergies  Allergen Reactions  . Shrimp [Shellfish Allergy] Nausea And Vomiting  . Lisinopril Rash    Palmar rash     Outpatient Medications Prior to Visit  Medication Sig Dispense Refill  . B-D UF III MINI PEN NEEDLES 31G X 5 MM MISC USE TO INJECT AT BEDTIME 100 each 2  . BIKTARVY 50-200-25 MG TABS tablet TAKE 1 TABLET BY MOUTH DAILY 30 tablet 5  . Blood Glucose Monitoring Suppl (ONETOUCH  VERIO) w/Device KIT USE AS DIRECTED 1 kit 0  . diclofenac sodium (VOLTAREN) 1 % GEL Apply 2 g topically 4 (four) times daily. 100 g 2  . glucose blood (ONETOUCH VERIO) test strip Use as instructed 100 each 12  . hydrocortisone (ANUSOL-HC) 2.5 % rectal cream Apply to rectal hemorrhoid BID PRN 30 g 0  . hydrocortisone-pramoxine (ANALPRAM HC) 2.5-1 % rectal cream Place 1 application rectally 3 (three) times daily. 30 g 1  . Lancet Devices (ACCU-CHEK SOFTCLIX) lancets Use as instructed for 3 times daily testing of blood sugar. 1 each 0  . methylPREDNISolone (MEDROL) 4 MG tablet Take 1 tablet (4 mg total) by mouth 2 (two) times daily. 10 tablet 1  . ONETOUCH DELICA LANCETS 30Q MISC USE AS DIRECTED 100 each 11  . valACYclovir (VALTREX) 1000 MG tablet TAKE 1 TABLET(1000 MG) BY MOUTH TWICE DAILY 20 tablet 0  . Alcohol Swabs PADS 1 each by Does not apply route 3 (three) times daily before meals. 90 each 11  . amLODipine (NORVASC) 5 MG tablet TAKE 1 TABLET(5 MG) BY MOUTH DAILY 90 tablet 1  . atorvastatin (LIPITOR) 40 MG tablet Take 1 tablet (40 mg total) by mouth daily. 90 tablet 1  . cetirizine (ZYRTEC) 10 MG tablet TAKE 1 TABLET(10 MG) BY MOUTH DAILY 30 tablet 0  . ezetimibe (ZETIA) 10 MG tablet 1 daily 90 tablet 1  .  fenofibrate (TRICOR) 48 MG tablet 1 at bedtime 90 tablet 1  . fluticasone (FLONASE) 50 MCG/ACT nasal spray Place 2 sprays into both nostrils daily. 16 g 3  . glipiZIDE (GLUCOTROL) 10 MG tablet TAKE 1 TABLET(10 MG) BY MOUTH TWICE DAILY BEFORE A MEAL 180 tablet 1  . Insulin Glargine (BASAGLAR KWIKPEN) 100 UNIT/ML SOPN Inject 0.32 mLs (32 Units total) into the skin at bedtime. 15 mL 2   No facility-administered medications prior to visit.      ROS Review of Systems  Constitutional: Negative for activity change and appetite change.  HENT: Negative for sinus pressure and sore throat.   Eyes: Negative for visual disturbance.  Respiratory: Negative for cough, chest tightness and shortness  of breath.   Cardiovascular: Negative for chest pain and leg swelling.  Gastrointestinal: Negative for abdominal distention, abdominal pain, constipation and diarrhea.  Endocrine: Negative.   Genitourinary: Negative for dysuria.  Musculoskeletal: Negative for joint swelling and myalgias.  Skin: Negative for rash.  Allergic/Immunologic: Negative.   Neurological: Negative for weakness, light-headedness and numbness.  Psychiatric/Behavioral: Negative for dysphoric mood and suicidal ideas.    Objective:  BP (!) 142/82   Pulse 68   Temp 97.8 F (36.6 C) (Oral)   Ht '5\' 11"'  (1.803 m)   Wt 210 lb (95.3 kg)   SpO2 100%   BMI 29.29 kg/m   BP/Weight 11/13/2018 10/14/2018 7/61/4709  Systolic BP 295 747 340  Diastolic BP 82 73 67  Wt. (Lbs) 210 198 -  BMI 29.29 27.62 -      Physical Exam Constitutional:      Appearance: He is well-developed.  Cardiovascular:     Rate and Rhythm: Normal rate.     Heart sounds: Normal heart sounds. No murmur.  Pulmonary:     Effort: Pulmonary effort is normal.     Breath sounds: Normal breath sounds. No wheezing or rales.  Chest:     Chest wall: No tenderness.  Abdominal:     General: Bowel sounds are normal. There is no distension.     Palpations: Abdomen is soft. There is no mass.     Tenderness: There is no abdominal tenderness.  Musculoskeletal: Normal range of motion.  Neurological:     Mental Status: He is alert and oriented to person, place, and time.  Psychiatric:        Behavior: Behavior normal.     CMP Latest Ref Rng & Units 04/16/2018 12/20/2017 11/29/2017  Glucose 65 - 99 mg/dL 323(H) 233(H) 286(H)  BUN 7 - 25 mg/dL '19 17 13  ' Creatinine 0.70 - 1.33 mg/dL 1.28 1.23 1.18  Sodium 135 - 146 mmol/L 135 137 137  Potassium 3.5 - 5.3 mmol/L 4.2 4.4 4.1  Chloride 98 - 110 mmol/L 101 102 105  CO2 20 - 32 mmol/L '25 21 25  ' Calcium 8.6 - 10.3 mg/dL 9.8 9.6 9.3  Total Protein 6.1 - 8.1 g/dL 7.6 7.3 7.0  Total Bilirubin 0.2 - 1.2 mg/dL 0.5  0.4 0.8  Alkaline Phos 39 - 117 IU/L - 71 -  AST 10 - 35 U/L '16 19 20  ' ALT 9 - 46 U/L '18 16 16    ' Lipid Panel     Component Value Date/Time   CHOL 129 12/20/2017 0929   TRIG 72 12/20/2017 0929   HDL 43 12/20/2017 0929   CHOLHDL 3.0 12/20/2017 0929   CHOLHDL 3.9 04/28/2016 1050   VLDL 21 04/28/2016 1050   LDLCALC 72 12/20/2017 0929  CBC    Component Value Date/Time   WBC 4.2 04/16/2018 1006   RBC 4.43 04/16/2018 1006   HGB 13.1 (L) 04/16/2018 1006   HCT 39.7 04/16/2018 1006   PLT 171 04/16/2018 1006   MCV 89.6 04/16/2018 1006   MCH 29.6 04/16/2018 1006   MCHC 33.0 04/16/2018 1006   RDW 11.5 04/16/2018 1006   LYMPHSABS 1.2 08/10/2012 1037   MONOABS 0.2 08/10/2012 1037   EOSABS 0.2 08/10/2012 1037   BASOSABS 0.0 08/10/2012 1037    Lab Results  Component Value Date   HGBA1C 8.1 (A) 11/13/2018    Assessment & Plan:   1. Type 2 diabetes mellitus with hyperglycemia, with long-term current use of insulin (HCC) Uncontrolled with A1c of 8.1 which has trended down from 9.1 previously but is still above goal of less than 7 Increased dose of Lantus to 32 units twice daily Advised to make appointment with his ophthalmologist Dr. Juanda Chance - POCT glucose (manual entry) - POCT glycosylated hemoglobin (Hb A1C) - Insulin Glargine (BASAGLAR KWIKPEN) 100 UNIT/ML SOPN; Inject 0.35 mLs (35 Units total) into the skin at bedtime.  Dispense: 15 mL; Refill: 2 - Alcohol Swabs PADS; 1 each by Does not apply route 3 (three) times daily before meals.  Dispense: 90 each; Refill: 11 - glipiZIDE (GLUCOTROL) 10 MG tablet; TAKE 1 TABLET(10 MG) BY MOUTH TWICE DAILY BEFORE A MEAL  Dispense: 180 tablet; Refill: 1 - CMP14+EGFR - Microalbumin / creatinine urine ratio  2. Healthcare maintenance Stool fit test from 11/2017 was negative Due for a colonoscopy however he is unable to afford the co-pay  3. Essential hypertension, benign Slightly above goal No regimen change today Lifestyle  modifications and if elevated at next visit we will increase amlodipine dose Counseled on blood pressure goal of less than 130/80, low-sodium, DASH diet, medication compliance, 150 minutes of moderate intensity exercise per week. Discussed medication compliance, adverse effects. - amLODipine (NORVASC) 5 MG tablet; TAKE 1 TABLET(5 MG) BY MOUTH DAILY  Dispense: 90 tablet; Refill: 1  4. Pure hypercholesterolemia Controlled Low-cholesterol diet - ezetimibe (ZETIA) 10 MG tablet; 1 daily  Dispense: 90 tablet; Refill: 1 - fenofibrate (TRICOR) 48 MG tablet; 1 at bedtime  Dispense: 90 tablet; Refill: 1 - atorvastatin (LIPITOR) 40 MG tablet; Take 1 tablet (40 mg total) by mouth daily.  Dispense: 90 tablet; Refill: 1  5. Asymptomatic human immunodeficiency virus (hiv) infection status ([place]) Stable Followed by infectious disease  6. Seasonal allergies Advised to use saline nose rinses Continue Flonase and Zyrtec - Allergy Panel 11, Mold Group - Allergy Panel 19, Seafood Group - Allergy Panel 18, Nut Mix Group - Allergy Panel 16, Vegetable Group - Allergy Panel 15, Cereal Group   Meds ordered this encounter  Medications  . Insulin Glargine (BASAGLAR KWIKPEN) 100 UNIT/ML SOPN    Sig: Inject 0.35 mLs (35 Units total) into the skin at bedtime.    Dispense:  15 mL    Refill:  2    Dose change  . Alcohol Swabs PADS    Sig: 1 each by Does not apply route 3 (three) times daily before meals.    Dispense:  90 each    Refill:  11  . amLODipine (NORVASC) 5 MG tablet    Sig: TAKE 1 TABLET(5 MG) BY MOUTH DAILY    Dispense:  90 tablet    Refill:  1    **Patient requests 90 days supply**  . cetirizine (ZYRTEC) 10 MG tablet    Sig:  TAKE 1 TABLET(10 MG) BY MOUTH DAILY    Dispense:  90 tablet    Refill:  1  . ezetimibe (ZETIA) 10 MG tablet    Sig: 1 daily    Dispense:  90 tablet    Refill:  1  . fenofibrate (TRICOR) 48 MG tablet    Sig: 1 at bedtime    Dispense:  90 tablet    Refill:  1  .  glipiZIDE (GLUCOTROL) 10 MG tablet    Sig: TAKE 1 TABLET(10 MG) BY MOUTH TWICE DAILY BEFORE A MEAL    Dispense:  180 tablet    Refill:  1  . fluticasone (FLONASE) 50 MCG/ACT nasal spray    Sig: Place 2 sprays into both nostrils daily.    Dispense:  16 g    Refill:  3  . atorvastatin (LIPITOR) 40 MG tablet    Sig: Take 1 tablet (40 mg total) by mouth daily.    Dispense:  90 tablet    Refill:  1    Follow-up: Return in about 3 months (around 02/13/2019) for Medical conditions.       Charlott Rakes, MD, FAAFP. [facility], [place]   11/13/2018, 9:09 AM

## 2018-11-14 LAB — CMP14+EGFR
ALT: 22 IU/L (ref 0–44)
AST: 23 IU/L (ref 0–40)
Albumin/Globulin Ratio: 1.5 (ref 1.2–2.2)
Albumin: 4.5 g/dL (ref 3.8–4.9)
Alkaline Phosphatase: 72 IU/L (ref 39–117)
BUN/Creatinine Ratio: 13 (ref 9–20)
BUN: 15 mg/dL (ref 6–24)
Bilirubin Total: 0.6 mg/dL (ref 0.0–1.2)
CO2: 24 mmol/L (ref 20–29)
Calcium: 9.8 mg/dL (ref 8.7–10.2)
Chloride: 100 mmol/L (ref 96–106)
Creatinine, Ser: 1.18 mg/dL (ref 0.76–1.27)
GFR calc Af Amer: 78 mL/min/{1.73_m2} (ref >59)
GFR calc non Af Amer: 68 mL/min/{1.73_m2} (ref >59)
Globulin, Total: 3 g/dL (ref 1.5–4.5)
Glucose: 195 mg/dL — ABNORMAL HIGH (ref 65–99)
Potassium: 4.5 mmol/L (ref 3.5–5.2)
Sodium: 137 mmol/L (ref 134–144)
Total Protein: 7.5 g/dL (ref 6.0–8.5)

## 2018-11-22 ENCOUNTER — Telehealth: Payer: Self-pay

## 2018-11-22 NOTE — Telephone Encounter (Signed)
Patient was called to go over lab results. Patient has a voicemail that has not been set up to leave a message.

## 2018-11-22 NOTE — Telephone Encounter (Signed)
-----   Message from Charlott Rakes, MD sent at 11/15/2018 12:31 PM EDT ----- Labs are stable

## 2018-11-23 NOTE — Telephone Encounter (Signed)
Patient name and DOB has been verified Patient was informed of lab results. Patient had no questions.  

## 2018-11-28 ENCOUNTER — Encounter: Payer: Self-pay | Admitting: Internal Medicine

## 2018-11-28 ENCOUNTER — Ambulatory Visit: Payer: Medicare Other

## 2018-11-28 ENCOUNTER — Other Ambulatory Visit: Payer: Self-pay

## 2018-12-13 ENCOUNTER — Other Ambulatory Visit: Payer: Self-pay | Admitting: Internal Medicine

## 2018-12-13 DIAGNOSIS — B20 Human immunodeficiency virus [HIV] disease: Secondary | ICD-10-CM

## 2019-01-29 ENCOUNTER — Ambulatory Visit: Payer: Medicare Other

## 2019-01-29 ENCOUNTER — Other Ambulatory Visit: Payer: Self-pay

## 2019-02-13 DIAGNOSIS — Z23 Encounter for immunization: Secondary | ICD-10-CM | POA: Diagnosis not present

## 2019-02-19 ENCOUNTER — Ambulatory Visit: Payer: Medicare Other | Admitting: Family Medicine

## 2019-02-25 DIAGNOSIS — I10 Essential (primary) hypertension: Secondary | ICD-10-CM | POA: Diagnosis not present

## 2019-02-25 DIAGNOSIS — Z79899 Other long term (current) drug therapy: Secondary | ICD-10-CM | POA: Diagnosis not present

## 2019-02-25 DIAGNOSIS — Z1159 Encounter for screening for other viral diseases: Secondary | ICD-10-CM | POA: Diagnosis not present

## 2019-02-25 DIAGNOSIS — Z125 Encounter for screening for malignant neoplasm of prostate: Secondary | ICD-10-CM | POA: Diagnosis not present

## 2019-03-29 ENCOUNTER — Other Ambulatory Visit: Payer: Self-pay | Admitting: Pharmacist

## 2019-03-29 MED ORDER — LANTUS SOLOSTAR 100 UNIT/ML ~~LOC~~ SOPN: 35.0000 [IU] | PEN_INJECTOR | Freq: Every day | SUBCUTANEOUS | 1 refills | Status: AC

## 2019-04-01 DIAGNOSIS — H15121 Nodular episcleritis, right eye: Secondary | ICD-10-CM | POA: Diagnosis not present

## 2019-04-01 DIAGNOSIS — Z6828 Body mass index (BMI) 28.0-28.9, adult: Secondary | ICD-10-CM | POA: Diagnosis not present

## 2019-04-01 DIAGNOSIS — I10 Essential (primary) hypertension: Secondary | ICD-10-CM | POA: Diagnosis not present

## 2019-04-01 DIAGNOSIS — Z794 Long term (current) use of insulin: Secondary | ICD-10-CM | POA: Diagnosis not present

## 2019-04-01 DIAGNOSIS — E119 Type 2 diabetes mellitus without complications: Secondary | ICD-10-CM | POA: Diagnosis not present

## 2019-04-01 DIAGNOSIS — E663 Overweight: Secondary | ICD-10-CM | POA: Diagnosis not present

## 2019-04-30 ENCOUNTER — Other Ambulatory Visit: Payer: Self-pay | Admitting: Internal Medicine

## 2019-04-30 DIAGNOSIS — M5441 Lumbago with sciatica, right side: Secondary | ICD-10-CM

## 2019-05-01 ENCOUNTER — Telehealth: Payer: Self-pay

## 2019-05-01 ENCOUNTER — Other Ambulatory Visit: Payer: Medicare Other

## 2019-05-01 ENCOUNTER — Other Ambulatory Visit: Payer: Self-pay

## 2019-05-01 DIAGNOSIS — B20 Human immunodeficiency virus [HIV] disease: Secondary | ICD-10-CM

## 2019-05-01 NOTE — Telephone Encounter (Signed)
Patient requested to speak with someone on clinical team regarding some questions. States that his left leg has been hurting, and would like to try Ibuprofen 800 mg to help relieve this. States he wants to make sure there are no interactions between Benjamin Hunter and Ibuprofen. Would like MDs opinion. Will forward message to Md. Savoy

## 2019-05-01 NOTE — Telephone Encounter (Signed)
I called [place] but did not get any answer on his home or cell phone.  I was able to leave a message on his cell phone.  I do not think that there is a significant problem with ibuprofen interacting with tenofovir alafenamide.  I did suggest that he try a lower dose of ibuprofen, may be 400 to 600 mg alternating with acetaminophen.  He follows up here in 2 weeks.

## 2019-05-02 LAB — T-HELPER CELL (CD4) - (RCID CLINIC ONLY)
CD4 % Helper T Cell: 29 % — ABNORMAL LOW (ref 33–65)
CD4 T Cell Abs: 488 /uL (ref 400–1790)

## 2019-05-07 LAB — COMPREHENSIVE METABOLIC PANEL
AG Ratio: 1.4 (calc) (ref 1.0–2.5)
ALT: 19 U/L (ref 9–46)
AST: 17 U/L (ref 10–35)
Albumin: 4.2 g/dL (ref 3.6–5.1)
Alkaline phosphatase (APISO): 65 U/L (ref 35–144)
BUN/Creatinine Ratio: 22 (calc) (ref 6–22)
BUN: 29 mg/dL — ABNORMAL HIGH (ref 7–25)
CO2: 25 mmol/L (ref 20–32)
Calcium: 9.8 mg/dL (ref 8.6–10.3)
Chloride: 100 mmol/L (ref 98–110)
Creat: 1.33 mg/dL (ref 0.70–1.33)
Globulin: 3 g/dL (calc) (ref 1.9–3.7)
Glucose, Bld: 308 mg/dL — ABNORMAL HIGH (ref 65–99)
Potassium: 4.6 mmol/L (ref 3.5–5.3)
Sodium: 134 mmol/L — ABNORMAL LOW (ref 135–146)
Total Bilirubin: 0.4 mg/dL (ref 0.2–1.2)
Total Protein: 7.2 g/dL (ref 6.1–8.1)

## 2019-05-07 LAB — CBC
HCT: 42.8 % (ref 38.5–50.0)
Hemoglobin: 14.5 g/dL (ref 13.2–17.1)
MCH: 30.3 pg (ref 27.0–33.0)
MCHC: 33.9 g/dL (ref 32.0–36.0)
MCV: 89.4 fL (ref 80.0–100.0)
MPV: 12.2 fL (ref 7.5–12.5)
Platelets: 208 10*3/uL (ref 140–400)
RBC: 4.79 10*6/uL (ref 4.20–5.80)
RDW: 12 % (ref 11.0–15.0)
WBC: 5.4 10*3/uL (ref 3.8–10.8)

## 2019-05-07 LAB — RPR: RPR Ser Ql: NONREACTIVE

## 2019-05-07 LAB — HIV-1 RNA QUANT-NO REFLEX-BLD
HIV 1 RNA Quant: <20 copies/mL
HIV-1 RNA Quant, Log: <1.3 Log copies/mL

## 2019-05-14 ENCOUNTER — Other Ambulatory Visit: Payer: Self-pay

## 2019-05-14 ENCOUNTER — Ambulatory Visit
Admission: RE | Admit: 2019-05-14 | Discharge: 2019-05-14 | Disposition: A | Discharge status: Home / Self Care | Payer: Medicare Other | Source: Ambulatory Visit | Attending: Internal Medicine | Admitting: Internal Medicine

## 2019-05-14 DIAGNOSIS — M5441 Lumbago with sciatica, right side: Secondary | ICD-10-CM

## 2019-05-15 ENCOUNTER — Other Ambulatory Visit: Payer: Self-pay

## 2019-05-15 ENCOUNTER — Encounter: Payer: Self-pay | Admitting: Internal Medicine

## 2019-05-15 ENCOUNTER — Ambulatory Visit (INDEPENDENT_AMBULATORY_CARE_PROVIDER_SITE_OTHER): Payer: Medicare Other | Admitting: Internal Medicine

## 2019-05-15 DIAGNOSIS — Z794 Long term (current) use of insulin: Secondary | ICD-10-CM

## 2019-05-15 DIAGNOSIS — E1165 Type 2 diabetes mellitus with hyperglycemia: Secondary | ICD-10-CM | POA: Diagnosis not present

## 2019-05-15 DIAGNOSIS — M79604 Pain in right leg: Secondary | ICD-10-CM | POA: Diagnosis not present

## 2019-05-15 DIAGNOSIS — B20 Human immunodeficiency virus [HIV] disease: Secondary | ICD-10-CM

## 2019-05-15 NOTE — Progress Notes (Signed)
Patient Active Problem List   Diagnosis Date Noted  . Human immunodeficiency virus (HIV) disease ([place]) 07/17/2008    Priority: High  . 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 Seasonal allergies 09/20/2011    Priority: Medium  . Polyarthritis 09/06/2011    Priority: Medium  . Type 2 diabetes mellitus with hyperglycemia, with long-term current use of insulin ([place]) 03/16/2009    Priority: Medium  . Hyperlipidemia 07/17/2008    Priority: Medium  . Hypertension 07/17/2008    Priority: Medium  . Right leg pain 05/15/2019  . 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 Depression 01/14/2013  . Rash and nonspecific skin eruption 10/11/2012  . Recurrent low back pain 10/11/2012  . Left facial numbness 08/20/2012    Patient's Medications  New Prescriptions   No medications on file  Previous Medications   ALCOHOL SWABS PADS    1 each by Does not apply route 3 (three) times daily before meals.   AMLODIPINE (NORVASC) 5 MG TABLET    TAKE 1 TABLET(5 MG) BY MOUTH DAILY   ATORVASTATIN (LIPITOR) 40 MG TABLET    Take 1 tablet (40 mg total) by mouth daily.   B-D UF III MINI PEN NEEDLES 31G X 5 MM MISC    USE TO INJECT AT BEDTIME   BIKTARVY 50-200-25 MG TABS TABLET    TAKE 1 TABLET BY MOUTH DAILY   BLOOD GLUCOSE MONITORING SUPPL (ONETOUCH VERIO) W/DEVICE KIT    USE AS DIRECTED   CETIRIZINE (ZYRTEC) 10 MG TABLET    TAKE 1 TABLET(10 MG) BY MOUTH DAILY   DICLOFENAC SODIUM (VOLTAREN) 1 % GEL    Apply 2 g topically 4 (four) times daily.   EZETIMIBE (ZETIA) 10 MG TABLET    1 daily   FENOFIBRATE (TRICOR) 48 MG TABLET    1 at bedtime   FLUTICASONE (FLONASE) 50 MCG/ACT NASAL SPRAY    Place 2 sprays into both nostrils daily.   GLIPIZIDE (GLUCOTROL) 10 MG TABLET    TAKE 1 TABLET(10 MG) BY MOUTH TWICE DAILY BEFORE A MEAL   GLUCOSE BLOOD (ONETOUCH VERIO) TEST STRIP    Use as instructed   HYDROCORTISONE (ANUSOL-HC) 2.5 % RECTAL CREAM    Apply to rectal hemorrhoid BID PRN   HYDROCORTISONE-PRAMOXINE (ANALPRAM HC) 2.5-1 % RECTAL CREAM    Place 1 application rectally 3 (three) times daily.   INSULIN GLARGINE (LANTUS SOLOSTAR) 100 UNIT/ML SOLOSTAR PEN    Inject 35 Units into the skin at bedtime.   LANCET DEVICES (ACCU-CHEK SOFTCLIX) LANCETS    Use as instructed for 3 times daily testing of blood sugar.   METHYLPREDNISOLONE (MEDROL) 4 MG TABLET    Take 1 tablet (4 mg total) by mouth 2 (two) times daily.   ONETOUCH DELICA LANCETS 34J MISC    USE AS DIRECTED   VALACYCLOVIR (VALTREX) 1000 MG TABLET    TAKE 1 TABLET(1000 MG) BY MOUTH TWICE DAILY  Modified Medications   No medications on file  Discontinued Medications   No medications on file    Subjective: Benjamin Hunter is in for his routine HIV follow-up visit.  As usual, he never has any problems obtaining, taking or tolerating his Biktarvy and never misses a dose.  About 1 month ago he was stepping up into the back of a truck when he experienced sudden onset of pain radiating from his right hip down his anterior right thigh.  He has been taking acetaminophen.  His PCP  ordered an MRI of his lumbar spine which was done yesterday.  It showed:  IMPRESSION: The significant findings are likely at the L4-5 level. There is advanced chronic bilateral facet arthropathy with 5 mm of anterolisthesis. There is disc degeneration with circumferential protrusion more prominent towards the right. Stenosis of the lateral recesses and foramina could cause neural compression on either or both sides. Findings are slightly worse on the right than the left.  Benjamin Hunter has been doing some stretching exercises at home and says that the pain is improving slowly.  He says that he is still drinking several sodas a day and has been eating some candy.  His blood sugars are frequently in the 300 range.  Review of Systems: Review of Systems  Constitutional: Negative for fever and weight loss.  HENT: Negative for congestion.   Respiratory: Negative for  cough, sputum production and shortness of breath.   Cardiovascular: Negative for chest pain.  Gastrointestinal: Negative for abdominal pain, diarrhea, nausea and vomiting.  Musculoskeletal: Positive for joint pain.  Skin: Negative for rash.  Neurological: Negative for sensory change and focal weakness.  Psychiatric/Behavioral: Negative for depression.    Past Medical History:  Diagnosis Date  . Arthritis   . Depression   . Diabetes mellitus   . HIV (human immunodeficiency virus infection) ([place])   . Hyperlipidemia   . Hypertension     Social History   Tobacco Use  . Smoking status: Never Smoker  . Smokeless tobacco: Never Used  Substance Use Topics  . Alcohol use: Yes    Alcohol/week: 1.0 - 2.0 standard drinks    Types: 1 - 2 Cans of beer per week    Comment: occasional   . Drug use: No    Family History  Problem Relation Age of Onset  . Hypertension Mother   . Heart failure Father     Allergies  Allergen Reactions  . Shrimp [Shellfish Allergy] Nausea And Vomiting  . Lisinopril Rash    Palmar rash     Health Maintenance  Topic Date Due  . COLONOSCOPY  11/25/2009  . OPHTHALMOLOGY EXAM  04/13/2016  . FOOT EXAM  12/20/2018  . INFLUENZA VACCINE  12/29/2018  . URINE MICROALBUMIN  04/19/2019  . HEMOGLOBIN A1C  05/15/2019  . TETANUS/TDAP  07/05/2027  . PNEUMOCOCCAL POLYSACCHARIDE VACCINE AGE 20-64 HIGH RISK  Completed  . Hepatitis C Screening  Completed  . HIV Screening  Completed    Objective:  Vitals:   05/15/19 1335  BP: 125/82  Pulse: 77  Temp: 98 F (36.7 C)  TempSrc: Oral  Weight: 201 lb (91.2 kg)   Body mass index is 28.03 kg/m.  Physical Exam Constitutional:      Comments: He is talkative and in good spirits.  Cardiovascular:     Rate and Rhythm: Normal rate and regular rhythm.     Heart sounds: No murmur.  Pulmonary:     Effort: Pulmonary effort is normal.     Breath sounds: Normal breath sounds.  Abdominal:     Palpations: Abdomen  is soft.     Tenderness: There is no abdominal tenderness.  Musculoskeletal:        General: No swelling or tenderness.  Skin:    Findings: No rash.  Neurological:     General: No focal deficit present.     Comments: Gait is normal.  Psychiatric:        Mood and Affect: Mood normal.     Lab Results Lab Results  Component Value Date   WBC 5.4 05/01/2019   HGB 14.5 05/01/2019   HCT 42.8 05/01/2019   MCV 89.4 05/01/2019   PLT 208 05/01/2019    Lab Results  Component Value Date   CREATININE 1.33 05/01/2019   BUN 29 (H) 05/01/2019   NA 134 (L) 05/01/2019   K 4.6 05/01/2019   CL 100 05/01/2019   CO2 25 05/01/2019    Lab Results  Component Value Date   ALT 19 05/01/2019   AST 17 05/01/2019   ALKPHOS 72 11/13/2018   BILITOT 0.4 05/01/2019    Lab Results  Component Value Date   CHOL 129 12/20/2017   HDL 43 12/20/2017   LDLCALC 72 12/20/2017   TRIG 72 12/20/2017   CHOLHDL 3.0 12/20/2017   Lab Results  Component Value Date   LABRPR NON-REACTIVE 05/01/2019   HIV 1 RNA Quant (copies/mL)  Date Value  05/01/2019 <20 NOT DETECTED  04/16/2018 29 (H)  11/29/2017 <20 DETECTED (A)   CD4 T Cell Abs (/uL)  Date Value  05/01/2019 488  04/16/2018 480  11/29/2017 540     Problem List Items Addressed This Visit      High   Human immunodeficiency virus (HIV) disease ([place])    His infection remains under excellent, long-term control.  He has already received his influenza vaccine.  He will continue Biktarvy and follow-up after lab work in 1 year.      Relevant Orders   1 Year CBC   1 Year CD4   1 Year CMP   1 Year RPR   1 Year VL     Medium   Type 2 diabetes mellitus with hyperglycemia, with long-term current use of insulin ([place])    He knows that he needs to do better controlling his blood sugar.  I asked him to cut out his sodas and candy.        Unprioritized   Right leg pain    The cause of his acute right leg pain is unclear.  He has advanced degenerative  spine disease but it is unclear to me if the pain is caused by radiculopathy, muscle strain or right hip injury.  I encouraged him to continue doing his stretching exercises.  He is doing his best to stay active.  He is planning on following up with his PCP.           Michel Bickers, MD [facility] [phone] pager   [phone] cell 05/15/2019, 2:10 PM

## 2019-05-15 NOTE — Assessment & Plan Note (Signed)
He knows that he needs to do better controlling his blood sugar.  I asked him to cut out his sodas and candy.

## 2019-05-15 NOTE — Assessment & Plan Note (Signed)
The cause of his acute right leg pain is unclear.  He has advanced degenerative spine disease but it is unclear to me if the pain is caused by radiculopathy, muscle strain or right hip injury.  I encouraged him to continue doing his stretching exercises.  He is doing his best to stay active.  He is planning on following up with his PCP.

## 2019-05-15 NOTE — Assessment & Plan Note (Signed)
His infection remains under excellent, long-term control.  He has already received his influenza vaccine.  He will continue Biktarvy and follow-up after lab work in 1 year. 

## 2019-05-18 ENCOUNTER — Ambulatory Visit (HOSPITAL_COMMUNITY)
Admission: EM | Admit: 2019-05-18 | Discharge: 2019-05-18 | Disposition: A | Discharge status: Home / Self Care | Payer: Medicare Other

## 2019-05-18 ENCOUNTER — Encounter (HOSPITAL_COMMUNITY): Payer: Self-pay

## 2019-05-18 ENCOUNTER — Other Ambulatory Visit: Payer: Self-pay

## 2019-05-18 DIAGNOSIS — M79604 Pain in right leg: Secondary | ICD-10-CM

## 2019-05-18 DIAGNOSIS — S76911A Strain of unspecified muscles, fascia and tendons at thigh level, right thigh, initial encounter: Secondary | ICD-10-CM

## 2019-05-18 NOTE — ED Triage Notes (Signed)
Pt present leg and back pain symptoms been going on for over a week.

## 2019-05-18 NOTE — Discharge Instructions (Signed)
You may take these medications together or stagger them if you like. -Take tylenol/acetaminophen 325mg  2 tablets every 6 hours as needed. -Take the aspirin back and body ache medication you have been using as directed on product labeling.  Continue you stretching, heat, ice as tolerated and preferred.   Follow up with your primary care at your Appointment on 12/28.    Return if you have worsening of pain.  Go to the emergency department if you experience sever chest pain, severe pain, feel like you pass out, lose feeling in your legs, lose control of your urine or bowels.

## 2019-05-18 NOTE — ED Provider Notes (Signed)
[place]    CSN: [id number] Arrival date & time: 05/18/19  1415      History   Chief Complaint Chief Complaint  Patient presents with  . Back Pain  . Leg Pain    HPI Benjamin Hunter is a 58 y.o. male.   Patient reports to urgent care today for Right leg pain for 6 weeks in duration. He reports about 6 weeks ago he "stepped into the back of a truck and felt pain in his right hip, thigh and back of his leg". He states the pain has been present since then. He describes the pain as tightness and aching in his thigh and back of his leg. He notes it hurts with walking and this has changed his activity tolerance somewhat. At its worst the pain is a 8/10. He has utilized tylenol, aspirin and ibuprofen and stretching to some mild relief. He had greatest improvement with aspirin and tylenol combination. He does note some overall improvement of his symptoms. He denies loss of sensation, weakness tingling, loss of bowel or bladder control.   He notes some left sided chest pain and side pain. He describes the chest pain as "burning" and feels like it is in the muscle. The side pain is aching. He notes laying on his left side frequently and these have started following his leg pain. He is concerned about possible lung infections or heart concerns. He denies shortness of breath, nausea, vomiting, sweating.   We discussed and I reassured him that it does not seem like there is anything wrong with his lungs or heart.  Of note, he has had imaging of his lower back which showed arthritic, stenosis and disk degeneration. He notes a injury many years ago that started these problems. He has a follow up appointment with his primary care on this on 05/27/2019.     Past Medical History:  Diagnosis Date  . Arthritis   . Depression   . Diabetes mellitus   . HIV (human immunodeficiency virus infection) ([place])   . Hyperlipidemia   . Hypertension     Patient Active Problem List   Diagnosis  Date Noted  . Right leg pain 05/15/2019  . 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 Depression 01/14/2013  . Rash and nonspecific skin eruption 10/11/2012  . Recurrent low back pain 10/11/2012  . Left facial numbness 08/20/2012  . Seasonal allergies 09/20/2011  . Polyarthritis 09/06/2011  . Type 2 diabetes mellitus with hyperglycemia, with long-term current use of insulin ([place]) 03/16/2009  . Human immunodeficiency virus (HIV) disease ([place]) 07/17/2008  . Hyperlipidemia 07/17/2008  . Hypertension 07/17/2008    History reviewed. No pertinent surgical history.     Home Medications    Prior to Admission medications   Medication Sig Start Date End Date Taking? Authorizing Provider  Alcohol Swabs PADS 1 each by Does not apply route 3 (three) times daily before meals. 11/13/18   Charlott Rakes, MD  amLODipine (NORVASC) 5 MG tablet TAKE 1 TABLET(5 MG) BY MOUTH DAILY 11/13/18   Charlott Rakes, MD  atorvastatin (LIPITOR) 40 MG tablet Take 1 tablet (40 mg total) by mouth daily. 11/13/18   Charlott Rakes, MD  B-D UF III MINI PEN NEEDLES 31G X 5 MM MISC USE TO INJECT AT BEDTIME 06/21/18   Charlott Rakes, MD  BIKTARVY 50-200-25 MG TABS tablet TAKE 1 TABLET BY MOUTH DAILY 12/13/18   Michel Bickers, MD  Blood Glucose Monitoring Suppl [facility] VERIO) w/Device KIT  USE AS DIRECTED 03/14/18   Charlott Rakes, MD  cetirizine (ZYRTEC) 10 MG tablet TAKE 1 TABLET(10 MG) BY MOUTH DAILY 11/13/18   Charlott Rakes, MD  diclofenac sodium (VOLTAREN) 1 % GEL Apply 2 g topically 4 (four) times daily. 09/08/18   Robyn Haber, MD  ezetimibe (ZETIA) 10 MG tablet 1 daily 11/13/18   Charlott Rakes, MD  fenofibrate (TRICOR) 48 MG tablet 1 at bedtime 11/13/18   Charlott Rakes, MD  fluticasone (FLONASE) 50 MCG/ACT nasal spray Place 2 sprays into both nostrils daily. 11/13/18   Charlott Rakes, MD  glipiZIDE (GLUCOTROL) 10 MG tablet TAKE 1 TABLET(10 MG) BY MOUTH TWICE DAILY BEFORE A MEAL 11/13/18    Charlott Rakes, MD  glucose blood (ONETOUCH VERIO) test strip Use as instructed 03/14/18   Charlott Rakes, MD  hydrocortisone (ANUSOL-HC) 2.5 % rectal cream Apply to rectal hemorrhoid BID PRN 08/20/18   Ladell Pier, MD  hydrocortisone-pramoxine (ANALPRAM HC) 2.5-1 % rectal cream Place 1 application rectally 3 (three) times daily. 08/14/18   Charlott Rakes, MD  Insulin Glargine (LANTUS SOLOSTAR) 100 UNIT/ML Solostar Pen Inject 35 Units into the skin at bedtime. 03/29/19   Charlott Rakes, MD  Lancet Devices [facility]) lancets Use as instructed for 3 times daily testing of blood sugar. 12/07/15   Charlott Rakes, MD  methylPREDNISolone (MEDROL) 4 MG tablet Take 1 tablet (4 mg total) by mouth 2 (two) times daily. Patient not taking: Reported on 05/15/2019 09/01/18   Robyn Haber, MD  [facility] DELICA LANCETS 31S MISC USE AS DIRECTED 03/14/18   Charlott Rakes, MD  valACYclovir (VALTREX) 1000 MG tablet TAKE 1 TABLET(1000 MG) BY MOUTH TWICE DAILY 10/01/18   Charlott Rakes, MD    Family History Family History  Problem Relation Age of Onset  . Hypertension Mother   . Heart failure Father     Social History Social History   Tobacco Use  . Smoking status: Never Smoker  . Smokeless tobacco: Never Used  Substance Use Topics  . Alcohol use: Yes    Alcohol/week: 1.0 - 2.0 standard drinks    Types: 1 - 2 Cans of beer per week    Comment: occasional   . Drug use: No     Allergies   Shrimp [shellfish allergy] and Lisinopril   Review of Systems Review of Systems  Constitutional: Positive for activity change. Negative for chills and fever.  HENT: Negative for congestion and sore throat.   Respiratory: Negative for cough and shortness of breath.   Cardiovascular: Negative for chest pain and palpitations.  Gastrointestinal: Negative for abdominal pain and vomiting.  Genitourinary: Negative for decreased urine volume, difficulty urinating, hematuria and urgency.   Musculoskeletal: Positive for arthralgias, back pain, gait problem and myalgias. Negative for joint swelling.  Skin: Negative for color change and rash.  Neurological: Negative for weakness and numbness.  Hematological: Negative for adenopathy. Does not bruise/bleed easily.  All other systems reviewed and are negative.    Physical Exam Triage Vital Signs ED Triage Vitals  Enc Vitals Group     BP 05/18/19 1552 132/89     Pulse Rate 05/18/19 1552 77     Resp 05/18/19 1552 16     Temp 05/18/19 1552 98.4 F (36.9 C)     Temp Source 05/18/19 1552 Oral     SpO2 05/18/19 1552 99 %     Weight --      Height --      Head Circumference --  Peak Flow --      Pain Score 05/18/19 1553 8     Pain Loc --      Pain Edu? --      Excl. in [place]? --    No data found.  Updated Vital Signs BP 132/89 (BP Location: Right Arm)   Pulse 77   Temp 98.4 F (36.9 C) (Oral)   Resp 16   SpO2 99%   Visual Acuity Right Eye Distance:   Left Eye Distance:   Bilateral Distance:    Right Eye Near:   Left Eye Near:    Bilateral Near:     Physical Exam Vitals and nursing note reviewed.  Constitutional:      Appearance: He is well-developed.  HENT:     Head: Normocephalic and atraumatic.  Eyes:     Conjunctiva/sclera: Conjunctivae normal.  Cardiovascular:     Rate and Rhythm: Normal rate and regular rhythm.     Pulses: Normal pulses.     Heart sounds: Normal heart sounds. No murmur.  Pulmonary:     Effort: Pulmonary effort is normal. No respiratory distress.     Breath sounds: Normal breath sounds. No wheezing.  Abdominal:     Palpations: Abdomen is soft.     Tenderness: There is no abdominal tenderness.  Musculoskeletal:        General: Tenderness (Over quadraceps and TFL. Some pain with terminal flexion at the knee, described as "tight") present. No swelling, deformity or signs of injury. Normal range of motion.     Cervical back: Neck supple.     Right lower leg: No edema.     Left  lower leg: No edema.  Skin:    General: Skin is warm and dry.  Neurological:     General: No focal deficit present.     Mental Status: He is alert and oriented to person, place, and time.     Sensory: No sensory deficit.     Motor: No weakness.     Coordination: Coordination normal.     Gait: Gait normal.     Deep Tendon Reflexes: Reflexes normal.  Psychiatric:        Mood and Affect: Mood normal.        Behavior: Behavior normal.        Thought Content: Thought content normal.        Judgment: Judgment normal.      UC Treatments / Results  Labs (all labs ordered are listed, but only abnormal results are displayed) Labs Reviewed - No data to display  EKG   Radiology No results found.  Procedures Procedures (including critical care time)  Medications Ordered in UC Medications - No data to display  Initial Impression / Assessment and Plan / UC Course  I have reviewed the triage vital signs and the nursing notes.  Pertinent labs & imaging results that were available during my care of the patient were reviewed by me and considered in my medical decision making (see chart for details).     Muscle Strain - Symptoms suggestive of muscle strain as opposed to related to previous back pathology. Encouraging with symptom improvement. Has near follow up for spine issues. Discussed at length the options for pain management we agreed on combination of tylenol and aspirin , as this worked well for him. Return precautions given.  Final Clinical Impressions(s) / UC Diagnoses   Final diagnoses:  Muscle strain of right thigh, initial encounter  Right leg pain  Discharge Instructions     You may take these medications together or stagger them if you like. -Take tylenol/acetaminophen 347m 2 tablets every 6 hours as needed. -Take the aspirin back and body ache medication you have been using as directed on product labeling.  Continue you stretching, heat, ice as tolerated  and preferred.   Follow up with your primary care at your Appointment on 12/28.    Return if you have worsening of pain.  Go to the emergency department if you experience sever chest pain, severe pain, feel like you pass out, lose feeling in your legs, lose control of your urine or bowels.      ED Prescriptions    None     PDMP not reviewed this encounter.   DPurnell Shoemaker PA-C 05/18/19 1728

## 2019-06-03 ENCOUNTER — Other Ambulatory Visit: Payer: Self-pay

## 2019-06-03 ENCOUNTER — Ambulatory Visit: Payer: Medicare Other

## 2019-06-03 ENCOUNTER — Encounter: Payer: Self-pay | Admitting: Internal Medicine

## 2019-08-16 ENCOUNTER — Other Ambulatory Visit: Payer: Self-pay | Admitting: Physician Assistant

## 2019-08-22 ENCOUNTER — Other Ambulatory Visit: Payer: Self-pay | Admitting: Family Medicine

## 2019-08-22 DIAGNOSIS — E7800 Pure hypercholesterolemia, unspecified: Secondary | ICD-10-CM

## 2019-08-22 DIAGNOSIS — I10 Essential (primary) hypertension: Secondary | ICD-10-CM

## 2019-08-30 ENCOUNTER — Other Ambulatory Visit: Payer: Self-pay

## 2019-08-30 ENCOUNTER — Ambulatory Visit (HOSPITAL_COMMUNITY)
Admission: EM | Admit: 2019-08-30 | Discharge: 2019-08-30 | Disposition: A | Discharge status: Home / Self Care | Payer: Medicare Other | Attending: Family Medicine | Admitting: Family Medicine

## 2019-08-30 ENCOUNTER — Ambulatory Visit (INDEPENDENT_AMBULATORY_CARE_PROVIDER_SITE_OTHER): Payer: Medicare Other

## 2019-08-30 ENCOUNTER — Encounter (HOSPITAL_COMMUNITY): Payer: Self-pay

## 2019-08-30 DIAGNOSIS — S20211A Contusion of right front wall of thorax, initial encounter: Secondary | ICD-10-CM

## 2019-08-30 DIAGNOSIS — R0781 Pleurodynia: Secondary | ICD-10-CM

## 2019-08-30 DIAGNOSIS — G8911 Acute pain due to trauma: Secondary | ICD-10-CM | POA: Diagnosis not present

## 2019-08-30 NOTE — Discharge Instructions (Signed)
CLINICAL DATA:  Posttraumatic right rib pain   EXAM: RIGHT RIBS AND CHEST - 3+ VIEW   COMPARISON:  Chest x-ray 01/12/2017   FINDINGS: No fracture or other bone lesions are seen involving the ribs. There is no evidence of pneumothorax or pleural effusion. Both lungs are clear. Heart size and mediastinal contours are within normal limits.   IMPRESSION: Negative.

## 2019-08-30 NOTE — ED Triage Notes (Signed)
Pt is here with right flank pain that started last Saturday after climbing into a window.

## 2019-08-30 NOTE — ED Provider Notes (Signed)
[facility]    CSN: [id number] Arrival date & time: 08/30/19  1093      History   Chief Complaint Chief Complaint  Patient presents with  . Flank Pain    HPI Benjamin Hunter is a 59 y.o. male.   [facility] [place] patient  Pt is here with right flank pain that started last Saturday after climbing into a window.   Most of the soreness has resolved (legs, left chest) but the right lateral ribs still hurt, especially with cough or sneeze.     Past Medical History:  Diagnosis Date  . Arthritis   . Depression   . Diabetes mellitus   . HIV (human immunodeficiency virus infection) ([place])   . Hyperlipidemia   . Hypertension     Patient Active Problem List   Diagnosis Date Noted  . Right leg pain 05/15/2019  . 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 Depression 01/14/2013  . Rash and nonspecific skin eruption 10/11/2012  . Recurrent low back pain 10/11/2012  . Left facial numbness 08/20/2012  . Seasonal allergies 09/20/2011  . Polyarthritis 09/06/2011  . Type 2 diabetes mellitus with hyperglycemia, with long-term current use of insulin ([place]) 03/16/2009  . Human immunodeficiency virus (HIV) disease ([place]) 07/17/2008  . Hyperlipidemia 07/17/2008  . Hypertension 07/17/2008    History reviewed. No pertinent surgical history.     Home Medications    Prior to Admission medications   Medication Sig Start Date End Date Taking? Authorizing Provider  Alcohol Swabs PADS 1 each by Does not apply route 3 (three) times daily before meals. 11/13/18   Charlott Rakes, MD  amLODipine (NORVASC) 5 MG tablet TAKE 1 TABLET(5 MG) BY MOUTH DAILY 11/13/18   Charlott Rakes, MD  atorvastatin (LIPITOR) 40 MG tablet Take 1 tablet (40 mg total) by mouth daily. 11/13/18   Charlott Rakes, MD  B-D UF III MINI PEN NEEDLES 31G X 5 MM MISC USE TO INJECT AT BEDTIME 06/21/18   Charlott Rakes, MD  BIKTARVY 50-200-25 MG TABS tablet TAKE 1 TABLET BY MOUTH DAILY 12/13/18    Michel Bickers, MD  Blood Glucose Monitoring Suppl [facility] VERIO) w/Device KIT USE AS DIRECTED 03/14/18   Charlott Rakes, MD  cetirizine (ZYRTEC) 10 MG tablet TAKE 1 TABLET(10 MG) BY MOUTH DAILY 11/13/18   Charlott Rakes, MD  diclofenac sodium (VOLTAREN) 1 % GEL Apply 2 g topically 4 (four) times daily. 09/08/18   Robyn Haber, MD  ezetimibe (ZETIA) 10 MG tablet 1 daily 11/13/18   Charlott Rakes, MD  fenofibrate (TRICOR) 48 MG tablet 1 at bedtime 11/13/18   Charlott Rakes, MD  fluticasone (FLONASE) 50 MCG/ACT nasal spray Place 2 sprays into both nostrils daily. 11/13/18   Charlott Rakes, MD  glipiZIDE (GLUCOTROL) 10 MG tablet TAKE 1 TABLET(10 MG) BY MOUTH TWICE DAILY BEFORE A MEAL 11/13/18   Charlott Rakes, MD  glucose blood (ONETOUCH VERIO) test strip Use as instructed 03/14/18   Charlott Rakes, MD  hydrocortisone (ANUSOL-HC) 2.5 % rectal cream Apply to rectal hemorrhoid BID PRN 08/20/18   Ladell Pier, MD  hydrocortisone cream 1 % APP AA QID FOR 7 DAYS 05/21/19   [provider]  hydrocortisone-pramoxine (ANALPRAM HC) 2.5-1 % rectal cream Place 1 application rectally 3 (three) times daily. 08/14/18   Charlott Rakes, MD  Insulin Glargine (LANTUS SOLOSTAR) 100 UNIT/ML Solostar Pen Inject 35 Units into the skin at bedtime. 03/29/19   Charlott Rakes, MD  Lancet Devices [facility]) lancets  Use as instructed for 3 times daily testing of blood sugar. 12/07/15   Charlott Rakes, MD  LANTUS 100 UNIT/ML injection  08/14/19   [provider]  Jonetta Speak LANCETS 19T MISC USE AS DIRECTED 03/14/18   Charlott Rakes, MD  valACYclovir (VALTREX) 1000 MG tablet TAKE 1 TABLET(1000 MG) BY MOUTH TWICE DAILY 10/01/18   Charlott Rakes, MD    Family History Family History  Problem Relation Age of Onset  . Hypertension Mother   . Heart failure Father     Social History Social History   Tobacco Use  . Smoking status: Never Smoker  . Smokeless tobacco: Never Used    Substance Use Topics  . Alcohol use: Yes    Alcohol/week: 1.0 - 2.0 standard drinks    Types: 1 - 2 Cans of beer per week    Comment: occasional   . Drug use: No     Allergies   Shrimp [shellfish allergy] and Lisinopril   Review of Systems Review of Systems  Constitutional: Negative.   Respiratory: Negative for cough and chest tightness.   Cardiovascular: Positive for chest pain.  All other systems reviewed and are negative.    Physical Exam Triage Vital Signs ED Triage Vitals  Enc Vitals Group     BP 08/30/19 0935 133/77     Pulse Rate 08/30/19 0935 76     Resp 08/30/19 0935 18     Temp 08/30/19 0935 98.6 F (37 C)     Temp Source 08/30/19 0935 Oral     SpO2 08/30/19 0935 96 %     Weight 08/30/19 0932 203 lb (92.1 kg)     Height --      Head Circumference --      Peak Flow --      Pain Score 08/30/19 0932 3     Pain Loc --      Pain Edu? --      Excl. in [place]? --    No data found.  Updated Vital Signs BP 133/77 (BP Location: Right Arm)   Pulse 76   Temp 98.6 F (37 C) (Oral)   Resp 18   Wt 92.1 kg   SpO2 96%   BMI 28.31 kg/m    Physical Exam Vitals and nursing note reviewed.  Constitutional:      Appearance: Normal appearance.  HENT:     Head: Normocephalic.     Nose: Nose normal.  Eyes:     Conjunctiva/sclera: Conjunctivae normal.     Pupils: Pupils are equal, round, and reactive to light.  Cardiovascular:     Rate and Rhythm: Normal rate.     Heart sounds: Normal heart sounds.  Pulmonary:     Effort: Pulmonary effort is normal.     Breath sounds: Normal breath sounds.  Abdominal:     Palpations: Abdomen is soft.     Tenderness: There is no abdominal tenderness.  Musculoskeletal:        General: Normal range of motion.     Cervical back: Normal range of motion and neck supple.  Skin:    General: Skin is warm and dry.  Neurological:     General: No focal deficit present.     Mental Status: He is alert and oriented to person, place,  and time.  Psychiatric:        Mood and Affect: Mood normal.        Behavior: Behavior normal.        Thought Content: Thought  content normal.        Judgment: Judgment normal.      UC Treatments / Results  Labs (all labs ordered are listed, but only abnormal results are displayed) Labs Reviewed - No data to display  EKG   Radiology DG Ribs Unilateral W/Chest Right  Result Date: 08/30/2019 CLINICAL DATA:  Posttraumatic right rib pain EXAM: RIGHT RIBS AND CHEST - 3+ VIEW COMPARISON:  Chest x-ray 01/12/2017 FINDINGS: No fracture or other bone lesions are seen involving the ribs. There is no evidence of pneumothorax or pleural effusion. Both lungs are clear. Heart size and mediastinal contours are within normal limits. IMPRESSION: Negative. Electronically Signed   By: Monte Fantasia M.D.   On: 08/30/2019 09:59    Procedures Procedures (including critical care time)  Medications Ordered in UC Medications - No data to display  Initial Impression / Assessment and Plan / UC Course  I have reviewed the triage vital signs and the nursing notes.  Pertinent labs & imaging results that were available during my care of the patient were reviewed by me and considered in my medical decision making (see chart for details).    Final Clinical Impressions(s) / UC Diagnoses   Final diagnoses:  Contusion of right chest wall, initial encounter     Discharge Instructions     CLINICAL DATA:  Posttraumatic right rib pain   EXAM: RIGHT RIBS AND CHEST - 3+ VIEW   COMPARISON:  Chest x-ray 01/12/2017   FINDINGS: No fracture or other bone lesions are seen involving the ribs. There is no evidence of pneumothorax or pleural effusion. Both lungs are clear. Heart size and mediastinal contours are within normal limits.   IMPRESSION: Negative.    ED Prescriptions    None     I have reviewed the PDMP during this encounter.   Robyn Haber, MD 08/30/19 1010

## 2019-10-20 ENCOUNTER — Other Ambulatory Visit: Payer: Self-pay | Admitting: Internal Medicine

## 2019-10-20 DIAGNOSIS — B20 Human immunodeficiency virus [HIV] disease: Secondary | ICD-10-CM

## 2019-11-17 ENCOUNTER — Other Ambulatory Visit: Payer: Self-pay | Admitting: Family Medicine

## 2019-11-19 ENCOUNTER — Encounter: Payer: Self-pay | Admitting: Internal Medicine

## 2019-11-22 ENCOUNTER — Other Ambulatory Visit: Payer: Self-pay

## 2019-11-22 ENCOUNTER — Encounter (HOSPITAL_COMMUNITY): Payer: Self-pay

## 2019-11-22 ENCOUNTER — Ambulatory Visit (HOSPITAL_COMMUNITY)
Admission: EM | Admit: 2019-11-22 | Discharge: 2019-11-22 | Disposition: A | Discharge status: Home / Self Care | Payer: Medicare Other | Attending: Emergency Medicine | Admitting: Emergency Medicine

## 2019-11-22 DIAGNOSIS — R21 Rash and other nonspecific skin eruption: Secondary | ICD-10-CM | POA: Diagnosis not present

## 2019-11-22 MED ORDER — PREDNISONE 10 MG (21) PO TBPK: ORAL_TABLET | Freq: Every day | ORAL | 0 refills | Status: DC

## 2019-11-22 MED ORDER — HYDROXYZINE HCL 25 MG PO TABS: 25.0000 mg | ORAL_TABLET | Freq: Four times a day (QID) | ORAL | 0 refills | Status: DC

## 2019-11-22 MED ORDER — METHYLPREDNISOLONE SODIUM SUCC 125 MG IJ SOLR
125.0000 mg | Freq: Once | INTRAMUSCULAR | Status: AC
  Administered 2019-11-22: 125 mg via INTRAMUSCULAR

## 2019-11-22 MED ORDER — METHYLPREDNISOLONE SODIUM SUCC 125 MG IJ SOLR
INTRAMUSCULAR | Status: AC
  Filled 2019-11-22: qty 2

## 2019-11-22 NOTE — Discharge Instructions (Addendum)
Do not drink while taking medications or drive d Unsure of the cause you do have several bites that appear to body may also use cortisone cream topical to help with itching

## 2019-11-22 NOTE — ED Triage Notes (Signed)
Pt resents to UC for rash on back, and face. Pt states rash began two days ago. Pt states rash is itchy , and non painful. Pt has been treating with benadryl cream, and Cortizone cream at home, with out relief. Pt denies contact with any irritants.

## 2019-11-22 NOTE — ED Provider Notes (Signed)
[facility]    CSN: [id number] Arrival date & time: 11/22/19  0802      History   Chief Complaint Chief Complaint  Patient presents with   Rash    HPI Benjamin Hunter is a 59 y.o. male.   Pt states that he has bug bites to arms, neck area and upper chest. He is unsure if they are bed bugs due to he has had this in the past. Has washed sheets at home but does ride the bus. Denies any new detergents, no new soaps.      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 No pertinent surgical history.     Home Medications    Prior to Admission medications   Medication Sig Start Date End Date Taking? Authorizing Provider  Alcohol Swabs PADS 1 each by Does not apply route 3 (three) times daily before meals. 11/13/18  Yes Newlin, Enobong, MD  amLODipine (NORVASC) 5 MG tablet TAKE 1 TABLET(5 MG) BY MOUTH DAILY 11/13/18  Yes Newlin, Enobong, MD  atorvastatin (LIPITOR) 40 MG tablet Take 1 tablet (40 mg total) by mouth daily. 11/13/18  Yes Newlin, Charlane Ferretti, MD  B-D UF III MINI PEN NEEDLES 31G X 5 MM MISC USE TO INJECT AT BEDTIME 06/21/18  Yes Newlin, Charlane Ferretti, MD  BIKTARVY 50-200-25 MG TABS tablet TAKE 1 TABLET BY MOUTH DAILY 10/21/19  Yes  Michel Bickers, MD  cetirizine (ZYRTEC) 10 MG tablet TAKE 1 TABLET(10 MG) BY MOUTH DAILY 11/13/18  Yes Charlott Rakes, MD  diclofenac sodium (VOLTAREN) 1 % GEL Apply 2 g topically 4 (four) times daily. 09/08/18  Yes Robyn Haber, MD  ezetimibe (ZETIA) 10 MG tablet 1 daily 11/13/18  Yes Charlott Rakes, MD  fenofibrate (TRICOR) 48 MG tablet 1 at bedtime 11/13/18  Yes Newlin, Enobong, MD  fluticasone (FLONASE) 50 MCG/ACT nasal spray Place 2 sprays into both nostrils daily. 11/13/18  Yes Newlin, Enobong, MD  glipiZIDE (GLUCOTROL) 10 MG tablet TAKE 1 TABLET(10 MG) BY MOUTH TWICE DAILY BEFORE A MEAL 11/13/18  Yes Charlott Rakes, MD  hydrocortisone (ANUSOL-HC) 2.5 % rectal cream Apply to rectal hemorrhoid BID PRN 08/20/18  Yes Ladell Pier, MD  hydrocortisone cream 1 % APP AA QID FOR 7 DAYS 05/21/19  Yes [provider]  Insulin Glargine (LANTUS SOLOSTAR) 100 UNIT/ML Solostar Pen Inject 35 Units into the skin at bedtime. 03/29/19  Yes Charlott Rakes, MD  Lancet Devices [facility]) lancets Use as instructed for 3 times daily testing of blood sugar. 12/07/15  Yes Charlott Rakes, MD  LANTUS 100 UNIT/ML injection  08/14/19  Yes [provider]  Jonetta Speak LANCETS 28U MISC USE AS DIRECTED 03/14/18  Yes Charlott Rakes, MD  valACYclovir (VALTREX) 1000 MG tablet TAKE  1 TABLET(1000 MG) BY MOUTH TWICE DAILY 10/01/18  Yes Newlin, Enobong, MD  Blood Glucose Monitoring Suppl (ONETOUCH VERIO) w/Device KIT USE AS DIRECTED 03/14/18   Charlott Rakes, MD  glucose blood (ONETOUCH VERIO) test strip Use as instructed 03/14/18   Charlott Rakes, MD  hydrocortisone-pramoxine (ANALPRAM HC) 2.5-1 % rectal cream Place 1 application rectally 3 (three) times daily. 08/14/18   Charlott Rakes, MD  hydrOXYzine (ATARAX/VISTARIL) 25 MG tablet Take 1 tablet (25 mg total) by mouth every 6 (six) hours. 11/22/19   Marney Setting, NP  predniSONE (STERAPRED UNI-PAK 21 TAB) 10 MG (21) TBPK tablet Take  by mouth daily. Take 6 tabs by mouth daily  for 2 days, then 5 tabs for 2 days, then 4 tabs for 2 days, then 3 tabs for 2 days, 2 tabs for 2 days, then 1 tab by mouth daily for 2 days 11/22/19   Marney Setting, NP    Family History Family History  Problem Relation Age of Onset   Hypertension Mother    Heart failure Father     Social History Social History   Tobacco Use   Smoking status: Never Smoker   Smokeless tobacco: Never Used  Scientific laboratory technician Use: Never used  Substance Use Topics   Alcohol use: Yes    Alcohol/week: 1.0 - 2.0 standard drink    Types: 1 - 2 Cans of beer per week    Comment: occasional    Drug use: No     Allergies   Shrimp [shellfish allergy] and Lisinopril   Review of Systems Review of Systems  Constitutional: Negative.   Respiratory: Negative.   Cardiovascular: Negative.   Skin: Positive for rash.       Bug bites   Neurological: Negative.      Physical Exam Triage Vital Signs ED Triage Vitals  Enc Vitals Group     BP 11/22/19 0819 (!) 165/74     Pulse Rate 11/22/19 0819 89     Resp 11/22/19 0819 18     Temp 11/22/19 0819 98.1 F (36.7 C)     Temp Source 11/22/19 0819 Oral     SpO2 11/22/19 0819 100 %     Weight --      Height --      Head Circumference --      Peak Flow --      Pain Score 11/22/19 0820 0     Pain Loc --      Pain Edu? --      Excl. in [place]? --    No data found.  Updated Vital Signs BP (!) 165/74 (BP Location: Right Arm)    Pulse 89    Temp 98.1 F (36.7 C) (Oral)    Resp 18    SpO2 100%   Visual Acuity     Physical Exam Cardiovascular:     Rate and Rhythm: Normal rate.  Pulmonary:     Effort: Pulmonary effort is normal.  Musculoskeletal:     Cervical back: Normal range of motion.  Skin:    Findings: Rash present.     Comments: Small areas of rash appearance and raised raised areas of bite appearance. To neck, neck area. None around waist area  No Open spores   Neurological:     Mental  Status: He is alert.      UC Treatments / Results  Labs (all labs ordered are listed, but only abnormal results are displayed) Labs Reviewed - No  data to display  EKG   Radiology No results found.  Procedures Procedures (including critical care time)  Medications Ordered in UC Medications  methylPREDNISolone sodium succinate (SOLU-MEDROL) 125 mg/2 mL injection 125 mg (125 mg Intramuscular Given 11/22/19 4332)    Initial Impression / Assessment and Plan / UC Course  I have reviewed the triage vital signs and the nursing notes.  Pertinent labs & imaging results that were available during my care of the patient were reviewed by me and considered in my medical decision making (see chart for details).     Unsure of the cause of bite, does not appear as bed bugs, can wash clothes and sheets to be sure Pt asking for steroids as he used in the past an a steroid shot.  Final Clinical Impressions(s) / UC Diagnoses   Final diagnoses:  Rash  Rash and nonspecific skin eruption     Discharge Instructions     Do not drink while taking medications or drive d Unsure of the cause you do have several bites that appear to body may also use cortisone cream topical to help with itching      ED Prescriptions    Medication Sig Dispense Auth. Provider   predniSONE (STERAPRED UNI-PAK 21 TAB) 10 MG (21) TBPK tablet Take by mouth daily. Take 6 tabs by mouth daily  for 2 days, then 5 tabs for 2 days, then 4 tabs for 2 days, then 3 tabs for 2 days, 2 tabs for 2 days, then 1 tab by mouth daily for 2 days 42 tablet Morley Kos L, NP   hydrOXYzine (ATARAX/VISTARIL) 25 MG tablet Take 1 tablet (25 mg total) by mouth every 6 (six) hours. 12 tablet Marney Setting, NP     PDMP not reviewed this encounter.   Marney Setting, NP 11/22/19 [phone]

## 2019-11-30 ENCOUNTER — Other Ambulatory Visit: Payer: Self-pay | Admitting: Family Medicine

## 2019-11-30 DIAGNOSIS — E7800 Pure hypercholesterolemia, unspecified: Secondary | ICD-10-CM

## 2019-12-03 ENCOUNTER — Ambulatory Visit: Payer: Medicare Other

## 2019-12-03 ENCOUNTER — Other Ambulatory Visit: Payer: Self-pay

## 2019-12-28 ENCOUNTER — Other Ambulatory Visit: Payer: Self-pay

## 2019-12-28 ENCOUNTER — Encounter (HOSPITAL_COMMUNITY): Payer: Self-pay

## 2019-12-28 ENCOUNTER — Ambulatory Visit (HOSPITAL_COMMUNITY)
Admission: EM | Admit: 2019-12-28 | Discharge: 2019-12-28 | Disposition: A | Discharge status: Home / Self Care | Payer: Medicare Other | Attending: Urgent Care | Admitting: Urgent Care

## 2019-12-28 DIAGNOSIS — E860 Dehydration: Secondary | ICD-10-CM

## 2019-12-28 DIAGNOSIS — R519 Headache, unspecified: Secondary | ICD-10-CM

## 2019-12-28 DIAGNOSIS — R42 Dizziness and giddiness: Secondary | ICD-10-CM | POA: Diagnosis not present

## 2019-12-28 DIAGNOSIS — I10 Essential (primary) hypertension: Secondary | ICD-10-CM

## 2019-12-28 DIAGNOSIS — E162 Hypoglycemia, unspecified: Secondary | ICD-10-CM

## 2019-12-28 LAB — CBG MONITORING, ED: Glucose-Capillary: 75 mg/dL (ref 70–99)

## 2019-12-28 LAB — POCT URINALYSIS DIP (DEVICE)
Bilirubin Urine: NEGATIVE
Glucose, UA: NEGATIVE mg/dL
Hgb urine dipstick: NEGATIVE
Ketones, ur: NEGATIVE mg/dL
Leukocytes,Ua: NEGATIVE
Nitrite: NEGATIVE
Protein, ur: 100 mg/dL — AB
Specific Gravity, Urine: >=1.03 (ref 1.005–1.030)
Urobilinogen, UA: 0.2 mg/dL (ref 0.0–1.0)
pH: 5 (ref 5.0–8.0)

## 2019-12-28 NOTE — ED Triage Notes (Signed)
Pt c/o dizziness that started upon waking at 0500 yesterday. Pt states has tightness in head, but took corn rows in hair that he just took out 2 days ago. Pt denies any other symptoms.

## 2019-12-28 NOTE — ED Provider Notes (Signed)
[place]   MRN: [id number] DOB: 06-04-1960  Subjective:   Benjamin Hunter is a 59 y.o. male presenting for 2-day history of persistent intermittent dizziness, mild tightness over his scalp area with slight headache.  Patient had corneals but he took them out just before the symptoms started. Had [place] vaccination February 2021.  Tries to drink a few cups of water daily. Drinks a lot of coffee especially in the morning.  Has history of high blood pressure, is compliant with his medication.  Also has history of diabetes and HIV, hyperlipidemia.  Denies weakness, chest pain, shortness of breath, heart racing, belly pain, nausea, vomiting, hematuria.  No current facility-administered medications for this encounter.  Current Outpatient Medications:  .  Alcohol Swabs PADS, 1 each by Does not apply route 3 (three) times daily before meals., Disp: 90 each, Rfl: 11 .  amLODipine (NORVASC) 5 MG tablet, TAKE 1 TABLET(5 MG) BY MOUTH DAILY, Disp: 90 tablet, Rfl: 1 .  atorvastatin (LIPITOR) 40 MG tablet, Take 1 tablet (40 mg total) by mouth daily., Disp: 90 tablet, Rfl: 1 .  B-D UF III MINI PEN NEEDLES 31G X 5 MM MISC, USE TO INJECT AT BEDTIME, Disp: 100 each, Rfl: 2 .  BIKTARVY 50-200-25 MG TABS tablet, TAKE 1 TABLET BY MOUTH DAILY, Disp: 30 tablet, Rfl: 8 .  Blood Glucose Monitoring Suppl (ONETOUCH VERIO) w/Device KIT, USE AS DIRECTED, Disp: 1 kit, Rfl: 0 .  cetirizine (ZYRTEC) 10 MG tablet, TAKE 1 TABLET(10 MG) BY MOUTH DAILY, Disp: 90 tablet, Rfl: 1 .  diclofenac sodium (VOLTAREN) 1 % GEL, Apply 2 g topically 4 (four) times daily., Disp: 100 g, Rfl: 2 .  ezetimibe (ZETIA) 10 MG tablet, 1 daily, Disp: 90 tablet, Rfl: 1 .  fenofibrate (TRICOR) 48 MG tablet, 1 at bedtime, Disp: 90 tablet, Rfl: 1 .  fluticasone (FLONASE) 50 MCG/ACT nasal spray, Place 2 sprays into both nostrils daily., Disp: 16 g, Rfl: 3 .  glipiZIDE (GLUCOTROL) 10 MG tablet, TAKE 1 TABLET(10 MG) BY MOUTH TWICE DAILY BEFORE A  MEAL, Disp: 180 tablet, Rfl: 1 .  glucose blood (ONETOUCH VERIO) test strip, Use as instructed, Disp: 100 each, Rfl: 12 .  hydrocortisone (ANUSOL-HC) 2.5 % rectal cream, Apply to rectal hemorrhoid BID PRN, Disp: 30 g, Rfl: 0 .  hydrocortisone cream 1 %, APP AA QID FOR 7 DAYS, Disp: , Rfl:  .  hydrocortisone-pramoxine (ANALPRAM HC) 2.5-1 % rectal cream, Place 1 application rectally 3 (three) times daily., Disp: 30 g, Rfl: 1 .  hydrOXYzine (ATARAX/VISTARIL) 25 MG tablet, Take 1 tablet (25 mg total) by mouth every 6 (six) hours., Disp: 12 tablet, Rfl: 0 .  Insulin Glargine (LANTUS SOLOSTAR) 100 UNIT/ML Solostar Pen, Inject 35 Units into the skin at bedtime., Disp: 15 mL, Rfl: 1 .  Lancet Devices (ACCU-CHEK SOFTCLIX) lancets, Use as instructed for 3 times daily testing of blood sugar., Disp: 1 each, Rfl: 0 .  LANTUS 100 UNIT/ML injection, , Disp: , Rfl:  .  ONETOUCH DELICA LANCETS 35K MISC, USE AS DIRECTED, Disp: 100 each, Rfl: 11 .  predniSONE (STERAPRED UNI-PAK 21 TAB) 10 MG (21) TBPK tablet, Take by mouth daily. Take 6 tabs by mouth daily  for 2 days, then 5 tabs for 2 days, then 4 tabs for 2 days, then 3 tabs for 2 days, 2 tabs for 2 days, then 1 tab by mouth daily for 2 days, Disp: 42 tablet, Rfl: 0 .  valACYclovir (VALTREX) 1000 MG tablet, TAKE 1  TABLET(1000 MG) BY MOUTH TWICE DAILY, Disp: 20 tablet, Rfl: 0   Allergies  Allergen Reactions  . Shrimp [Shellfish Allergy] Nausea And Vomiting  . Lisinopril Rash    Palmar rash     Past Medical History:  Diagnosis Date  . Arthritis   . Depression   . Diabetes mellitus   . HIV (human immunodeficiency virus infection) ([place])   . Hyperlipidemia   . Hypertension      No past surgical history on file.  Family History  Problem Relation Age of Onset  . Hypertension Mother   . Heart failure Father     Social History   Tobacco Use  . Smoking status: Never Smoker  . Smokeless tobacco: Never Used  Vaping Use  . Vaping Use: Never used    Substance Use Topics  . Alcohol use: Yes    Alcohol/week: 1.0 - 2.0 standard drink    Types: 1 - 2 Cans of beer per week    Comment: occasional   . Drug use: No    ROS   Objective:   Vitals: BP (!) 138/73 (BP Location: Right Arm)   Pulse 80   Temp 98.8 F (37.1 C) (Oral)   Resp 16   SpO2 100%   Physical Exam Constitutional:      General: He is not in acute distress.    Appearance: Normal appearance. He is well-developed. He is not ill-appearing, toxic-appearing or diaphoretic.  HENT:     Head: Normocephalic and atraumatic.     Right Ear: External ear normal.     Left Ear: External ear normal.     Nose: Nose normal.     Mouth/Throat:     Mouth: Mucous membranes are moist.     Pharynx: Oropharynx is clear.  Eyes:     General: No scleral icterus.       Right eye: No discharge.        Left eye: No discharge.     Extraocular Movements: Extraocular movements intact.     Conjunctiva/sclera: Conjunctivae normal.     Pupils: Pupils are equal, round, and reactive to light.  Neck:     Vascular: No carotid bruit.  Cardiovascular:     Rate and Rhythm: Normal rate and regular rhythm.     Heart sounds: Normal heart sounds. No murmur heard.  No friction rub. No gallop.   Pulmonary:     Effort: Pulmonary effort is normal. No respiratory distress.     Breath sounds: Normal breath sounds. No stridor. No wheezing, rhonchi or rales.  Musculoskeletal:        General: Normal range of motion.     Cervical back: Normal range of motion and neck supple.     Right lower leg: No edema.     Left lower leg: No edema.     Comments: Full range of motion throughout, strength 5/5 for upper and lower extremities.  Skin:    General: Skin is warm and dry.  Neurological:     Mental Status: He is alert and oriented to person, place, and time.     Cranial Nerves: No cranial nerve deficit.     Motor: No weakness.     Coordination: Coordination normal.     Gait: Gait normal.     Deep Tendon  Reflexes: Reflexes normal.     Comments: Negative Romberg and pronator drift.  Speech and memory intact.  Psychiatric:        Mood and Affect: Mood normal.  Behavior: Behavior normal.        Thought Content: Thought content normal.        Judgment: Judgment normal.     Results for orders placed or performed during the hospital encounter of 12/28/19 (from the past 24 hour(s))  POCT urinalysis dip (device)     Status: Abnormal   Collection Time: 12/28/19  2:27 PM  Result Value Ref Range   Glucose, UA NEGATIVE NEGATIVE mg/dL   Bilirubin Urine NEGATIVE NEGATIVE   Ketones, ur NEGATIVE NEGATIVE mg/dL   Specific Gravity, Urine >=1.030 1.005 - 1.030   Hgb urine dipstick NEGATIVE NEGATIVE   pH 5.0 5.0 - 8.0   Protein, ur 100 (A) NEGATIVE mg/dL   Urobilinogen, UA 0.2 0.0 - 1.0 mg/dL   Nitrite NEGATIVE NEGATIVE   Leukocytes,Ua NEGATIVE NEGATIVE  POC CBG monitoring     Status: None   Collection Time: 12/28/19  2:34 PM  Result Value Ref Range   Glucose-Capillary 75 70 - 99 mg/dL    Assessment and Plan :   PDMP not reviewed this encounter.  1. Dizziness   2. Acute nonintractable headache, unspecified headache type   3. Hypoglycemia   4. Dehydration   5. Essential hypertension     No signs of acute process including stroke, ACS on exam.  Recommended conservative management with better hydration, he was given Sprite following his blood sugar draw that showed hypoglycemia.  Recommended cutting back on his coffee use and limiting this to 1 cup/day.  Follow-up with PCP ASAP. Counseled patient on potential for adverse effects with medications prescribed/recommended today, strict ER and return-to-clinic precautions discussed, patient verbalized understanding.    Jaynee Eagles, PA-C 12/28/19 1450

## 2019-12-28 NOTE — ED Triage Notes (Signed)
Pt c/o dizziness onset yesterday, feeling "off balance", nausea today. Denies CP, SOB, slurred speech, ataxia, extremity weakness, HA, ear pain/pressure.    Smile symmetrical, grips equal/strong, PERRLA, legs equal/strong, no arm drift. Pt states he took his HTN medications this morning.

## 2019-12-29 ENCOUNTER — Ambulatory Visit (HOSPITAL_COMMUNITY)
Admission: EM | Admit: 2019-12-29 | Discharge: 2019-12-29 | Disposition: A | Discharge status: Home / Self Care | Payer: Medicare Other | Attending: Internal Medicine | Admitting: Internal Medicine

## 2019-12-29 DIAGNOSIS — Z20822 Contact with and (suspected) exposure to covid-19: Secondary | ICD-10-CM | POA: Insufficient documentation

## 2019-12-29 DIAGNOSIS — Z01812 Encounter for preprocedural laboratory examination: Secondary | ICD-10-CM | POA: Diagnosis not present

## 2019-12-30 LAB — SARS CORONAVIRUS 2 (TAT 6-24 HRS): SARS Coronavirus 2: NEGATIVE

## 2019-12-31 ENCOUNTER — Encounter: Payer: Self-pay | Admitting: Internal Medicine

## 2020-01-17 ENCOUNTER — Ambulatory Visit (HOSPITAL_COMMUNITY)
Admission: EM | Admit: 2020-01-17 | Discharge: 2020-01-17 | Disposition: A | Discharge status: Home / Self Care | Payer: Medicare Other | Attending: Family Medicine | Admitting: Family Medicine

## 2020-01-17 ENCOUNTER — Other Ambulatory Visit: Payer: Self-pay

## 2020-01-17 ENCOUNTER — Encounter (HOSPITAL_COMMUNITY): Payer: Self-pay

## 2020-01-17 DIAGNOSIS — F329 Major depressive disorder, single episode, unspecified: Secondary | ICD-10-CM | POA: Insufficient documentation

## 2020-01-17 DIAGNOSIS — E785 Hyperlipidemia, unspecified: Secondary | ICD-10-CM | POA: Diagnosis not present

## 2020-01-17 DIAGNOSIS — R067 Sneezing: Secondary | ICD-10-CM | POA: Insufficient documentation

## 2020-01-17 DIAGNOSIS — I10 Essential (primary) hypertension: Secondary | ICD-10-CM | POA: Diagnosis not present

## 2020-01-17 DIAGNOSIS — Z7901 Long term (current) use of anticoagulants: Secondary | ICD-10-CM | POA: Insufficient documentation

## 2020-01-17 DIAGNOSIS — Z794 Long term (current) use of insulin: Secondary | ICD-10-CM | POA: Insufficient documentation

## 2020-01-17 DIAGNOSIS — Z21 Asymptomatic human immunodeficiency virus [HIV] infection status: Secondary | ICD-10-CM | POA: Diagnosis not present

## 2020-01-17 DIAGNOSIS — Z79899 Other long term (current) drug therapy: Secondary | ICD-10-CM | POA: Diagnosis not present

## 2020-01-17 DIAGNOSIS — R0981 Nasal congestion: Secondary | ICD-10-CM | POA: Insufficient documentation

## 2020-01-17 DIAGNOSIS — M1990 Unspecified osteoarthritis, unspecified site: Secondary | ICD-10-CM | POA: Insufficient documentation

## 2020-01-17 DIAGNOSIS — U071 COVID-19: Secondary | ICD-10-CM | POA: Insufficient documentation

## 2020-01-17 DIAGNOSIS — E1165 Type 2 diabetes mellitus with hyperglycemia: Secondary | ICD-10-CM | POA: Diagnosis not present

## 2020-01-17 NOTE — Discharge Instructions (Addendum)
Go home to rest Drink plenty of fluids Take Tylenol for pain or fever Continue your flonase You may take over-the-counter cough and cold medicines as needed You must quarantine at home until your test result is available You can check for your test result in MyChart

## 2020-01-17 NOTE — ED Triage Notes (Signed)
Pt c/o runny nose and sneezing x 3-4 days, requesting COVID test

## 2020-01-17 NOTE — ED Provider Notes (Signed)
[facility]    CSN: [id number] Arrival date & time: 01/17/20  1512      History   Chief Complaint Chief Complaint  Patient presents with  . Nasal Congestion    HPI Benjamin Hunter is a 60 y.o. male.   HPI  Patient has a runny stuffy nose and sneezing for 3 or 3 days.  He would like to have a Covid test.  He does not think he has Covid, but wants to be careful regarding transmission.  States he has had Covid vaccinations. Past Medical History:  Diagnosis Date  . Arthritis   . Depression   . Diabetes mellitus   . HIV (human immunodeficiency virus infection) ([place])   . Hyperlipidemia   . Hypertension     Patient Active Problem List   Diagnosis Date Noted  . Right leg pain 05/15/2019  . 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 Depression 01/14/2013  . Rash and nonspecific skin eruption 10/11/2012  . Recurrent low back pain 10/11/2012  . Left facial numbness 08/20/2012  . Seasonal allergies 09/20/2011  . Polyarthritis 09/06/2011  . Type 2 diabetes mellitus with hyperglycemia, with long-term current use of insulin ([place]) 03/16/2009  . Human immunodeficiency virus (HIV) disease ([place]) 07/17/2008  . Hyperlipidemia 07/17/2008  . Hypertension 07/17/2008    History reviewed. No pertinent surgical history.     Home Medications    Prior to Admission medications   Medication Sig Start Date End Date Taking? Authorizing Provider  Alcohol Swabs PADS 1 each by Does not apply route 3 (three) times daily before meals. 11/13/18   Charlott Rakes, MD  amLODipine (NORVASC) 5 MG tablet TAKE 1 TABLET(5 MG) BY MOUTH DAILY 11/13/18   Charlott Rakes, MD  atorvastatin (LIPITOR) 40 MG tablet Take 1 tablet (40 mg total) by mouth daily. 11/13/18   Charlott Rakes, MD  B-D UF III MINI PEN NEEDLES 31G X 5 MM MISC USE TO INJECT AT BEDTIME 06/21/18   Charlott Rakes, MD  BIKTARVY 50-200-25 MG TABS tablet TAKE 1 TABLET BY MOUTH DAILY 10/21/19   Michel Bickers, MD    Blood Glucose Monitoring Suppl [facility] VERIO) w/Device KIT USE AS DIRECTED 03/14/18   Charlott Rakes, MD  cetirizine (ZYRTEC) 10 MG tablet TAKE 1 TABLET(10 MG) BY MOUTH DAILY 11/13/18   Charlott Rakes, MD  diclofenac sodium (VOLTAREN) 1 % GEL Apply 2 g topically 4 (four) times daily. 09/08/18   Robyn Haber, MD  ezetimibe (ZETIA) 10 MG tablet 1 daily 11/13/18   Charlott Rakes, MD  fenofibrate (TRICOR) 48 MG tablet 1 at bedtime 11/13/18   Charlott Rakes, MD  fluticasone (FLONASE) 50 MCG/ACT nasal spray Place 2 sprays into both nostrils daily. 11/13/18   Charlott Rakes, MD  glipiZIDE (GLUCOTROL) 10 MG tablet TAKE 1 TABLET(10 MG) BY MOUTH TWICE DAILY BEFORE A MEAL 11/13/18   Charlott Rakes, MD  glucose blood (ONETOUCH VERIO) test strip Use as instructed 03/14/18   Charlott Rakes, MD  hydrocortisone (ANUSOL-HC) 2.5 % rectal cream Apply to rectal hemorrhoid BID PRN 08/20/18   Ladell Pier, MD  hydrocortisone cream 1 % APP AA QID FOR 7 DAYS 05/21/19   [provider]  hydrocortisone-pramoxine (ANALPRAM HC) 2.5-1 % rectal cream Place 1 application rectally 3 (three) times daily. 08/14/18   Charlott Rakes, MD  hydrOXYzine (ATARAX/VISTARIL) 25 MG tablet Take 1 tablet (25 mg total) by mouth every 6 (six) hours. 11/22/19   Marney Setting, NP  Insulin Glargine (LANTUS SOLOSTAR)  100 UNIT/ML Solostar Pen Inject 35 Units into the skin at bedtime. 03/29/19   Charlott Rakes, MD  Lancet Devices [facility]) lancets Use as instructed for 3 times daily testing of blood sugar. 12/07/15   Charlott Rakes, MD  LANTUS 100 UNIT/ML injection  08/14/19   [provider]  Jonetta Speak LANCETS 50Y MISC USE AS DIRECTED 03/14/18   Charlott Rakes, MD  predniSONE (STERAPRED UNI-PAK 21 TAB) 10 MG (21) TBPK tablet Take by mouth daily. Take 6 tabs by mouth daily  for 2 days, then 5 tabs for 2 days, then 4 tabs for 2 days, then 3 tabs for 2 days, 2 tabs for 2 days, then 1 tab by mouth daily  for 2 days 11/22/19   Marney Setting, NP  valACYclovir (VALTREX) 1000 MG tablet TAKE 1 TABLET(1000 MG) BY MOUTH TWICE DAILY 10/01/18   Charlott Rakes, MD    Family History Family History  Problem Relation Age of Onset  . Hypertension Mother   . Heart failure Father     Social History Social History   Tobacco Use  . Smoking status: Never Smoker  . Smokeless tobacco: Never Used  Vaping Use  . Vaping Use: Never used  Substance Use Topics  . Alcohol use: Yes    Alcohol/week: 1.0 - 2.0 standard drink    Types: 1 - 2 Cans of beer per week    Comment: occasional   . Drug use: No     Allergies   Shrimp [shellfish allergy] and Lisinopril   Review of Systems Review of Systems See HPI  Physical Exam Triage Vital Signs ED Triage Vitals  Enc Vitals Group     BP 01/17/20 1557 132/78     Pulse Rate 01/17/20 1557 84     Resp 01/17/20 1557 18     Temp 01/17/20 1557 98.6 F (37 C)     Temp src --      SpO2 01/17/20 1557 100 %     Weight --      Height --      Head Circumference --      Peak Flow --      Pain Score 01/17/20 1556 0     Pain Loc --      Pain Edu? --      Excl. in [place]? --    No data found.  Updated Vital Signs BP 132/78   Pulse 84   Temp 98.6 F (37 C)   Resp 18   SpO2 100%        Physical Exam Constitutional:      General: He is not in acute distress.    Appearance: Normal appearance. He is well-developed.  HENT:     Head: Normocephalic and atraumatic.     Nose: Rhinorrhea present.     Comments: Clear rhinorrhea    Mouth/Throat:     Pharynx: Oropharynx is clear.  Eyes:     Conjunctiva/sclera: Conjunctivae normal.     Pupils: Pupils are equal, round, and reactive to light.  Cardiovascular:     Rate and Rhythm: Normal rate.  Pulmonary:     Effort: Pulmonary effort is normal. No respiratory distress.     Comments: Lungs are clear Musculoskeletal:        General: Normal range of motion.     Cervical back: Normal range of motion.    Skin:    General: Skin is warm and dry.  Neurological:     Mental Status: He is  alert.  Psychiatric:        Mood and Affect: Mood normal.        Behavior: Behavior normal.      UC Treatments / Results  Labs (all labs ordered are listed, but only abnormal results are displayed) Labs Reviewed  SARS CORONAVIRUS 2 (TAT 6-24 HRS)    EKG   Radiology No results found.  Procedures Procedures (including critical care time)  Medications Ordered in UC Medications - No data to display  Initial Impression / Assessment and Plan / UC Course  I have reviewed the triage vital signs and the nursing notes.  Pertinent labs & imaging results that were available during my care of the patient were reviewed by me and considered in my medical decision making (see chart for details).     Discussed quarantine pending test result.  Symptomatic care.  Return as needed Final Clinical Impressions(s) / UC Diagnoses   Final diagnoses:  Nasal congestion     Discharge Instructions     Go home to rest Drink plenty of fluids Take Tylenol for pain or fever Continue your flonase You may take over-the-counter cough and cold medicines as needed You must quarantine at home until your test result is available You can check for your test result in MyChart    ED Prescriptions    None     PDMP not reviewed this encounter.   Raylene Everts, MD 01/17/20 2129

## 2020-01-18 LAB — SARS CORONAVIRUS 2 (TAT 6-24 HRS): SARS Coronavirus 2: POSITIVE — AB

## 2020-01-19 ENCOUNTER — Telehealth (HOSPITAL_COMMUNITY): Payer: Self-pay

## 2020-01-19 ENCOUNTER — Telehealth: Payer: Self-pay | Admitting: Adult Health

## 2020-01-19 NOTE — Telephone Encounter (Signed)
Called to discuss with [name] about Covid symptoms and the use of bamlanivimab, a monoclonal antibody infusion for those with mild to moderate Covid symptoms and at a high risk of hospitalization.     Pt is qualified for this infusion at the [facility] infusion center due to co-morbid conditions and/or a member of an at-risk group, however declines infusion at this time. Symptoms tier reviewed as well as criteria for ending isolation.  Symptoms reviewed that would warrant ED/Hospital evaluation. Preventative practices reviewed. Patient verbalized understanding. Patient advised to call back if he decides that he does want to get infusion. Callback number to the infusion center given. Patient advised to go to Urgent care or ED with severe symptoms. Last date he  would be eligible for infusion is 01/26/20  . Says he will think about and call us back.    Patient Active Problem List   Diagnosis Date Noted  . Right leg pain 05/15/2019  . 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 Depression 01/14/2013  . Rash and nonspecific skin eruption 10/11/2012  . Recurrent low back pain 10/11/2012  . Left facial numbness 08/20/2012  . Seasonal allergies 09/20/2011  . Polyarthritis 09/06/2011  . Type 2 diabetes mellitus with hyperglycemia, with long-term current use of insulin (HCC) 03/16/2009  . Human immunodeficiency virus (HIV) disease (HCC) 07/17/2008  . Hyperlipidemia 07/17/2008  . Hypertension 07/17/2008    Racheal Mathurin NP-C  [facility]    01/19/2020

## 2020-01-19 NOTE — Telephone Encounter (Signed)
Returned call to pt who phoned yesterday requesting COVID results. Left message for pt only stating RN name, [facility] and that RN was returning his call and number to call us back.

## 2020-01-20 ENCOUNTER — Telehealth: Payer: Self-pay

## 2020-01-20 ENCOUNTER — Telehealth: Payer: Self-pay | Admitting: Unknown Physician Specialty

## 2020-01-20 ENCOUNTER — Other Ambulatory Visit: Payer: Self-pay | Admitting: Unknown Physician Specialty

## 2020-01-20 ENCOUNTER — Ambulatory Visit (HOSPITAL_COMMUNITY)
Admission: RE | Admit: 2020-01-20 | Discharge: 2020-01-20 | Disposition: A | Discharge status: Home / Self Care | Payer: Medicare Other | Source: Ambulatory Visit | Attending: Pulmonary Disease | Admitting: Pulmonary Disease

## 2020-01-20 ENCOUNTER — Encounter: Payer: Self-pay | Admitting: Unknown Physician Specialty

## 2020-01-20 DIAGNOSIS — B20 Human immunodeficiency virus [HIV] disease: Secondary | ICD-10-CM

## 2020-01-20 DIAGNOSIS — U071 COVID-19: Secondary | ICD-10-CM

## 2020-01-20 DIAGNOSIS — E1165 Type 2 diabetes mellitus with hyperglycemia: Secondary | ICD-10-CM | POA: Diagnosis not present

## 2020-01-20 DIAGNOSIS — Z23 Encounter for immunization: Secondary | ICD-10-CM | POA: Insufficient documentation

## 2020-01-20 DIAGNOSIS — Z794 Long term (current) use of insulin: Secondary | ICD-10-CM | POA: Insufficient documentation

## 2020-01-20 MED ORDER — DIPHENHYDRAMINE HCL 50 MG/ML IJ SOLN: 50.0000 mg | Freq: Once | INTRAMUSCULAR | Status: DC | PRN

## 2020-01-20 MED ORDER — SODIUM CHLORIDE 0.9 % IV SOLN: INTRAVENOUS | Status: DC | PRN

## 2020-01-20 MED ORDER — EPINEPHRINE 0.3 MG/0.3ML IJ SOAJ: 0.3000 mg | Freq: Once | INTRAMUSCULAR | Status: DC | PRN

## 2020-01-20 MED ORDER — FAMOTIDINE IN NACL 20-0.9 MG/50ML-% IV SOLN: 20.0000 mg | Freq: Once | INTRAVENOUS | Status: DC | PRN

## 2020-01-20 MED ORDER — SODIUM CHLORIDE 0.9 % IV SOLN
1200.0000 mg | Freq: Once | INTRAVENOUS | Status: AC
  Administered 2020-01-20: 1200 mg via INTRAVENOUS
  Filled 2020-01-20: qty 10

## 2020-01-20 MED ORDER — METHYLPREDNISOLONE SODIUM SUCC 125 MG IJ SOLR: 125.0000 mg | Freq: Once | INTRAMUSCULAR | Status: DC | PRN

## 2020-01-20 MED ORDER — ALBUTEROL SULFATE HFA 108 (90 BASE) MCG/ACT IN AERS: 2.0000 | INHALATION_SPRAY | Freq: Once | RESPIRATORY_TRACT | Status: DC | PRN

## 2020-01-20 NOTE — Telephone Encounter (Signed)
I connected by phone with Benjamin Hunter on 01/20/2020 at 8:50 AM to discuss the potential use of a new treatment for mild to moderate COVID-19 viral infection in non-hospitalized patients.  This patient is a 59 y.o. male that meets the FDA criteria for Emergency Use Authorization of COVID monoclonal antibody casirivimab/imdevimab.  Has a (+) direct SARS-CoV-2 viral test result  Has mild or moderate COVID-19   Is NOT hospitalized due to COVID-19  Is within 10 days of symptom onset  Has at least one of the high risk factor(s) for progression to severe COVID-19 and/or hospitalization as defined in EUA.  Specific high risk criteria : Immunosuppressive Disease or Treatment   I have spoken and communicated the following to the patient or parent/caregiver regarding COVID monoclonal antibody treatment:  1. FDA has authorized the emergency use for the treatment of mild to moderate COVID-19 in adults and pediatric patients with positive results of direct SARS-CoV-2 viral testing who are 33 years of age and older weighing at least 40 kg, and who are at high risk for progressing to severe COVID-19 and/or hospitalization.  2. The significant known and potential risks and benefits of COVID monoclonal antibody, and the extent to which such potential risks and benefits are unknown.  3. Information on available alternative treatments and the risks and benefits of those alternatives, including clinical trials.  4. Patients treated with COVID monoclonal antibody should continue to self-isolate and use infection control measures (e.g., wear mask, isolate, social distance, avoid sharing personal items, clean and disinfect "high touch" surfaces, and frequent handwashing) according to CDC guidelines.   5. The patient or parent/caregiver has the option to accept or refuse COVID monoclonal antibody treatment.  After reviewing this information with the patient, The patient agreed to proceed with receiving  casirivimab\imdevimab infusion and will be provided a copy of the Fact sheet prior to receiving the infusion. [name] 01/20/2020 8:50 AM

## 2020-01-20 NOTE — Progress Notes (Signed)
  Diagnosis: COVID-19  Physician:DR P. Wright  Procedure: Covid Infusion Clinic Med: casirivimab\imdevimab infusion - Provided patient with casirivimab\imdevimab fact sheet for patients, parents and caregivers prior to infusion.  Complications: No immediate complications noted.  Discharge: Discharged home   Benjamin Hunter 01/20/2020  

## 2020-01-20 NOTE — Telephone Encounter (Signed)
Referred patient to monoclonal antibody infusion team to triage/schedule for infusion. Patient knows to expect call.   Kipling Graser Loyola Mast, RN

## 2020-01-20 NOTE — Discharge Instructions (Signed)

## 2020-01-22 ENCOUNTER — Encounter: Payer: Self-pay | Admitting: Internal Medicine

## 2020-01-22 DIAGNOSIS — Z8616 Personal history of COVID-19: Secondary | ICD-10-CM | POA: Insufficient documentation

## 2020-01-22 NOTE — Telephone Encounter (Signed)
His December appointment should be good but asked him to let us know if he has new problems.

## 2020-01-22 NOTE — Telephone Encounter (Signed)
Patient called office today to update MD that he did receive Monoclonal antibody infusion Monday. Is feeling better, but has noticed some hearing loss. Is using nasal spray PRN. Has noticed some improvement with sinus congestion. Patient would like to know if MD would prefer to have patient seen before December or if current appointment is fine. Will forward message to MD.  Lorenso Courier, CMA

## 2020-01-24 ENCOUNTER — Telehealth: Payer: Self-pay | Admitting: *Deleted

## 2020-01-24 ENCOUNTER — Other Ambulatory Visit: Payer: Self-pay | Admitting: Family Medicine

## 2020-01-24 DIAGNOSIS — E1165 Type 2 diabetes mellitus with hyperglycemia: Secondary | ICD-10-CM

## 2020-01-24 NOTE — Telephone Encounter (Signed)
Patient feeling better, still with some sinus trouble, describes it as a weird smell/taste in the back of his throat. He wants to make sure that is not pneumonia.  He had the covid infusion 8/24.  He wanted to clarify the quarantine period (10 days, must be feeling better and fever free without medication 24 hours).  He knows to wait at least 90 days from infusion before he could get the booster shot. He reports having 2 doses of Moderna vaccine previously. Patient without difficult breathing, no cough.  He will call if he has any other questions or develops any issues. Andree Coss, RN

## 2020-01-27 ENCOUNTER — Ambulatory Visit (HOSPITAL_COMMUNITY)
Admission: EM | Admit: 2020-01-27 | Discharge: 2020-01-27 | Disposition: A | Discharge status: Home / Self Care | Payer: Medicare Other

## 2020-01-27 ENCOUNTER — Encounter (HOSPITAL_COMMUNITY): Payer: Self-pay | Admitting: Emergency Medicine

## 2020-01-27 ENCOUNTER — Other Ambulatory Visit: Payer: Self-pay

## 2020-01-27 DIAGNOSIS — U071 COVID-19: Secondary | ICD-10-CM | POA: Diagnosis not present

## 2020-01-27 NOTE — ED Provider Notes (Signed)
[place]    CSN: [id number] Arrival date & time: 01/27/20  0944      History   Chief Complaint Chief Complaint  Patient presents with   covid 19 positive    HPI Benjamin Hunter is a 59 y.o. male with past medical history of DM, HIV, hyperlipidemia, hypertension recently diagnosed with COVID-19 on 01/17/2020.  Patient fully vaccinated at time of diagnosis.  Patient received MAB infusion on 8/23.  He presents to urgent care today with some follow-up questions and to have oxygen level checked.  Patient states he is feeling well aside from some nasal congestion.  He denies any HA, dizziness, chest pain, SOB, ABD pain, N/V/D, recent fever or chills.    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 No pertinent surgical history.     Home Medications    Prior to Admission medications   Medication Sig Start Date End Date Taking? Authorizing Provider  Alcohol Swabs PADS 1 each by Does not apply route 3 (three) times daily before meals. 11/13/18  Yes Newlin, Enobong, MD  amLODipine (NORVASC) 5 MG tablet TAKE 1 TABLET(5 MG) BY MOUTH DAILY 11/13/18  Yes Newlin, Enobong, MD  atorvastatin (LIPITOR) 40 MG  tablet Take 1 tablet (40 mg total) by mouth daily. 11/13/18  Yes Newlin, Charlane Ferretti, MD  B-D UF III MINI PEN NEEDLES 31G X 5 MM MISC USE TO INJECT AT BEDTIME 06/21/18  Yes Newlin, Charlane Ferretti, MD  BIKTARVY 50-200-25 MG TABS tablet TAKE 1 TABLET BY MOUTH DAILY 10/21/19  Yes Michel Bickers, MD  Blood Glucose Monitoring Suppl (ONETOUCH VERIO) w/Device KIT USE AS DIRECTED 03/14/18  Yes Charlott Rakes, MD  cetirizine (ZYRTEC) 10 MG tablet TAKE 1 TABLET(10 MG) BY MOUTH DAILY 11/13/18  Yes Charlott Rakes, MD  diclofenac sodium (VOLTAREN) 1 % GEL Apply 2 g topically 4 (four) times daily. 09/08/18  Yes Robyn Haber, MD  ezetimibe (ZETIA) 10 MG tablet 1 daily 11/13/18  Yes Charlott Rakes, MD  fenofibrate (TRICOR) 48 MG tablet 1 at bedtime 11/13/18  Yes Newlin, Enobong, MD  fluticasone (FLONASE) 50 MCG/ACT nasal spray Place 2 sprays into both nostrils daily. 11/13/18  Yes Newlin, Enobong, MD  glipiZIDE (GLUCOTROL) 10 MG tablet TAKE 1 TABLET(10 MG) BY MOUTH TWICE DAILY BEFORE A MEAL 11/13/18  Yes Newlin, Enobong, MD  glucose blood (ONETOUCH VERIO) test strip Use as instructed 03/14/18  Yes Newlin, Charlane Ferretti, MD  hydrocortisone (ANUSOL-HC) 2.5 % rectal cream Apply to rectal hemorrhoid BID PRN 08/20/18  Yes Ladell Pier, MD  hydrocortisone cream 1 % APP AA QID FOR 7 DAYS 05/21/19  Yes [provider]  hydrocortisone-pramoxine (ANALPRAM HC) 2.5-1 % rectal cream Place 1 application rectally 3 (three) times daily.  08/14/18  Yes Charlott Rakes, MD  hydrOXYzine (ATARAX/VISTARIL) 25 MG tablet Take 1 tablet (25 mg total) by mouth every 6 (six) hours. 11/22/19  Yes Marney Setting, NP  Insulin Glargine (LANTUS SOLOSTAR) 100 UNIT/ML Solostar Pen Inject 35 Units into the skin at bedtime. 03/29/19  Yes Charlott Rakes, MD  Lancet Devices [facility]) lancets Use as instructed for 3 times daily testing of blood sugar. 12/07/15  Yes Charlott Rakes, MD  LANTUS 100 UNIT/ML injection  08/14/19  Yes [provider]  Jonetta Speak LANCETS 25K MISC USE AS DIRECTED 03/14/18  Yes Newlin, Charlane Ferretti, MD  predniSONE (STERAPRED UNI-PAK 21 TAB) 10 MG (21) TBPK tablet Take by mouth daily. Take 6 tabs by mouth daily  for 2 days, then 5 tabs for 2 days, then 4 tabs for 2 days, then 3 tabs for 2 days, 2 tabs for 2 days, then 1 tab by mouth daily for 2 days 11/22/19  Yes Marney Setting, NP  valACYclovir (VALTREX) 1000 MG tablet TAKE 1 TABLET(1000 MG) BY MOUTH TWICE DAILY 10/01/18  Yes Charlott Rakes, MD    Family History Family History  Problem Relation Age of Onset   Hypertension Mother    Heart failure Father     Social History Social History   Tobacco Use   Smoking status: Never Smoker   Smokeless tobacco: Never Used  Scientific laboratory technician Use: Never used  Substance Use Topics   Alcohol use: Yes    Alcohol/week: 1.0 - 2.0 standard drink    Types: 1 - 2 Cans of beer per week    Comment: occasional    Drug use: No     Allergies   Shrimp [shellfish allergy] and Lisinopril   Review of Systems As stated in HPI otherwise negative   Physical Exam Triage Vital Signs ED Triage Vitals [01/27/20 1217]  Enc Vitals Group     BP (!) 152/86     Pulse Rate 65     Resp 18     Temp 98.5 F (36.9 C)     Temp Source Oral     SpO2 100 %     Weight      Height      Head Circumference      Peak Flow      Pain Score 0     Pain Loc      Pain Edu?      Excl. in [name]?    No data found.  Updated Vital Signs BP (!) 152/86 (BP Location: Left Arm)    Pulse 65    Temp 98.5 F (36.9 C) (Oral)    Resp 18    SpO2 100%      Physical Exam Constitutional:      General: He is not in acute distress.    Appearance: Normal appearance. He is not ill-appearing.  HENT:     Right Ear: Tympanic membrane normal.     Left Ear: Tympanic membrane normal.     Nose: Congestion present.     Mouth/Throat:     Mouth: Mucous membranes are moist.     Pharynx: No oropharyngeal exudate or posterior  oropharyngeal erythema.  Cardiovascular:     Rate and Rhythm: Normal rate and regular rhythm.  Pulmonary:     Effort: Pulmonary effort is normal.     Breath sounds: Normal breath sounds. No wheezing or rales.  Abdominal:     General: Bowel sounds are normal.  Palpations: Abdomen is soft.  Musculoskeletal:     Cervical back: Normal range of motion and neck supple.  Skin:    General: Skin is warm and dry.  Neurological:     General: No focal deficit present.     Mental Status: He is alert.  Psychiatric:        Mood and Affect: Mood normal.        Behavior: Behavior normal.      UC Treatments / Results  Labs (all labs ordered are listed, but only abnormal results are displayed) Labs Reviewed - No data to display  EKG   Radiology No results found.  Procedures Procedures (including critical care time)  Medications Ordered in UC Medications - No data to display  Initial Impression / Assessment and Plan / UC Course  I have reviewed the triage vital signs and the nursing notes.  Pertinent labs & imaging results that were available during my care of the patient were reviewed by me and considered in my medical decision making (see chart for details).   Covid-19 -Patient with mild symptoms, has been fully vaccinated.  He received Mab infusion and tolerated well -VSS today -Okay to end isolation.  Follow-up as needed  Final Clinical Impressions(s) / UC Diagnoses   Final diagnoses:  [id number]     Discharge Instructions     Your exam looks great today.  I am glad you are feeling better.  Please return should you need anything else.    ED Prescriptions    None     PDMP not reviewed this encounter.   Rudolpho Sevin, NP 01/29/20 [phone]

## 2020-01-27 NOTE — ED Triage Notes (Signed)
Pt states he is here to have his oxygen checked and make sure he doesn't have pneumonia. Pt states he tested positive for covid on 8/20. Pt states he is still having nasal congestion.

## 2020-01-27 NOTE — Discharge Instructions (Addendum)
Your exam looks great today.  I am glad you are feeling better.  Please return should you need anything else.

## 2020-02-07 ENCOUNTER — Encounter (HOSPITAL_COMMUNITY): Payer: Self-pay

## 2020-02-07 ENCOUNTER — Ambulatory Visit (HOSPITAL_COMMUNITY)
Admission: EM | Admit: 2020-02-07 | Discharge: 2020-02-07 | Disposition: A | Discharge status: Home / Self Care | Payer: Medicare Other | Attending: Physician Assistant | Admitting: Physician Assistant

## 2020-02-07 ENCOUNTER — Other Ambulatory Visit: Payer: Self-pay

## 2020-02-07 DIAGNOSIS — J0100 Acute maxillary sinusitis, unspecified: Secondary | ICD-10-CM | POA: Diagnosis not present

## 2020-02-07 MED ORDER — SALINE SPRAY 0.65 % NA SOLN: 1.0000 | NASAL | 0 refills | Status: AC | PRN

## 2020-02-07 MED ORDER — GUAIFENESIN ER 600 MG PO TB12: 600.0000 mg | ORAL_TABLET | Freq: Two times a day (BID) | ORAL | 0 refills | Status: AC

## 2020-02-07 MED ORDER — AMOXICILLIN-POT CLAVULANATE 875-125 MG PO TABS: 1.0000 | ORAL_TABLET | Freq: Two times a day (BID) | ORAL | 0 refills | Status: AC

## 2020-02-07 NOTE — ED Triage Notes (Signed)
Patient presents to Urgent Care with complaints of sinus pressure since about 10 days ago. Patient reports he has been vaccinated for covid, also just tested positive on 8/20 for covid.

## 2020-02-07 NOTE — ED Provider Notes (Signed)
[place]    CSN: [id number] Arrival date & time: 02/07/20  1621      History   Chief Complaint Chief Complaint  Patient presents with  . Sinusitis    HPI Benjamin Hunter is a 59 y.o. male.   Patient presents for sinus congestion and pressure.  He states symptoms been present for around 10 days.  Reports facial pressure.  Reports symptoms started several days after testing positive for Covid on 01/17/2020.  He reports his other symptoms of cough, headaches and sore throat of all resolved.  Denies fever and chills.  Denies any shortness of breath.  Patient also asks about prednisone he has had prescribed for his back in the past whether he should use this for his sinuses.  Patient states this was prescribed a few months ago by his primary care.     Past Medical History:  Diagnosis Date  . Arthritis   . Depression   . Diabetes mellitus   . HIV (human immunodeficiency virus infection) ([place])   . Hyperlipidemia   . Hypertension     Patient Active Problem List   Diagnosis Date Noted  . History of COVID-19 01/22/2020  . Right leg pain 05/15/2019  . 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 Depression 01/14/2013  . Rash and nonspecific skin eruption 10/11/2012  . Recurrent low back pain 10/11/2012  . Left facial numbness 08/20/2012  . Seasonal allergies 09/20/2011  . Polyarthritis 09/06/2011  . Type 2 diabetes mellitus with hyperglycemia, with long-term current use of insulin ([place]) 03/16/2009  . Human immunodeficiency virus (HIV) disease ([place]) 07/17/2008  . Hyperlipidemia 07/17/2008  . Hypertension 07/17/2008    History reviewed. No pertinent surgical history.     Home Medications    Prior to Admission medications   Medication Sig Start Date End Date Taking? Authorizing Provider  Alcohol Swabs PADS 1 each by Does not apply route 3 (three) times daily before meals. 11/13/18   Charlott Rakes, MD  amLODipine (NORVASC) 5 MG tablet  TAKE 1 TABLET(5 MG) BY MOUTH DAILY 11/13/18   Charlott Rakes, MD  amoxicillin-clavulanate (AUGMENTIN) 875-125 MG tablet Take 1 tablet by mouth every 12 (twelve) hours for 10 days. 02/07/20 02/17/20  Antionne Enrique, Marguerita Beards, PA-C  atorvastatin (LIPITOR) 40 MG tablet Take 1 tablet (40 mg total) by mouth daily. 11/13/18   Charlott Rakes, MD  B-D UF III MINI PEN NEEDLES 31G X 5 MM MISC USE TO INJECT AT BEDTIME 06/21/18   Charlott Rakes, MD  BIKTARVY 50-200-25 MG TABS tablet TAKE 1 TABLET BY MOUTH DAILY 10/21/19   Michel Bickers, MD  Blood Glucose Monitoring Suppl [facility] VERIO) w/Device KIT USE AS DIRECTED 03/14/18   Charlott Rakes, MD  cetirizine (ZYRTEC) 10 MG tablet TAKE 1 TABLET(10 MG) BY MOUTH DAILY 11/13/18   Charlott Rakes, MD  diclofenac sodium (VOLTAREN) 1 % GEL Apply 2 g topically 4 (four) times daily. 09/08/18   Robyn Haber, MD  ezetimibe (ZETIA) 10 MG tablet 1 daily 11/13/18   Charlott Rakes, MD  fenofibrate (TRICOR) 48 MG tablet 1 at bedtime 11/13/18   Charlott Rakes, MD  fluticasone (FLONASE) 50 MCG/ACT nasal spray Place 2 sprays into both nostrils daily. 11/13/18   Charlott Rakes, MD  glipiZIDE (GLUCOTROL) 10 MG tablet TAKE 1 TABLET(10 MG) BY MOUTH TWICE DAILY BEFORE A MEAL 11/13/18   Charlott Rakes, MD  glucose blood (ONETOUCH VERIO) test strip Use as instructed 03/14/18   Charlott Rakes, MD  guaiFENesin ([place]) 600  MG 12 hr tablet Take 1 tablet (600 mg total) by mouth 2 (two) times daily for 7 days. 02/07/20 02/14/20  Andi Mahaffy, Marguerita Beards, PA-C  hydrocortisone (ANUSOL-HC) 2.5 % rectal cream Apply to rectal hemorrhoid BID PRN 08/20/18   Ladell Pier, MD  hydrocortisone cream 1 % APP AA QID FOR 7 DAYS 05/21/19   [provider]  hydrocortisone-pramoxine (ANALPRAM HC) 2.5-1 % rectal cream Place 1 application rectally 3 (three) times daily. 08/14/18   Charlott Rakes, MD  hydrOXYzine (ATARAX/VISTARIL) 25 MG tablet Take 1 tablet (25 mg total) by mouth every 6 (six) hours. 11/22/19    Marney Setting, NP  Insulin Glargine (LANTUS SOLOSTAR) 100 UNIT/ML Solostar Pen Inject 35 Units into the skin at bedtime. 03/29/19   Charlott Rakes, MD  Lancet Devices [facility]) lancets Use as instructed for 3 times daily testing of blood sugar. 12/07/15   Charlott Rakes, MD  LANTUS 100 UNIT/ML injection  08/14/19   [provider]  Jonetta Speak LANCETS 88P MISC USE AS DIRECTED 03/14/18   Charlott Rakes, MD  predniSONE (STERAPRED UNI-PAK 21 TAB) 10 MG (21) TBPK tablet Take by mouth daily. Take 6 tabs by mouth daily  for 2 days, then 5 tabs for 2 days, then 4 tabs for 2 days, then 3 tabs for 2 days, 2 tabs for 2 days, then 1 tab by mouth daily for 2 days 11/22/19   Marney Setting, NP  sodium chloride (OCEAN) 0.65 % SOLN nasal spray Place 1 spray into both nostrils as needed for congestion. 02/07/20   Real Cona, Marguerita Beards, PA-C  valACYclovir (VALTREX) 1000 MG tablet TAKE 1 TABLET(1000 MG) BY MOUTH TWICE DAILY 10/01/18   Charlott Rakes, MD    Family History Family History  Problem Relation Age of Onset  . Hypertension Mother   . Heart failure Father     Social History Social History   Tobacco Use  . Smoking status: Never Smoker  . Smokeless tobacco: Never Used  Vaping Use  . Vaping Use: Never used  Substance Use Topics  . Alcohol use: Yes    Alcohol/week: 21.0 standard drinks    Types: 21 Cans of beer per week  . Drug use: No     Allergies   Shrimp [shellfish allergy] and Lisinopril   Review of Systems Review of Systems   Physical Exam Triage Vital Signs ED Triage Vitals  Enc Vitals Group     BP 02/07/20 1735 (!) 141/78     Pulse Rate 02/07/20 1735 85     Resp 02/07/20 1735 16     Temp 02/07/20 1735 98.4 F (36.9 C)     Temp Source 02/07/20 1735 Oral     SpO2 02/07/20 1735 100 %     Weight --      Height --      Head Circumference --      Peak Flow --      Pain Score 02/07/20 1734 0     Pain Loc --      Pain Edu? --      Excl. in [place]? --     No data found.  Updated Vital Signs BP (!) 141/78 (BP Location: Right Arm)   Pulse 85   Temp 98.4 F (36.9 C) (Oral)   Resp 16   SpO2 100%   Visual Acuity Right Eye Distance:   Left Eye Distance:   Bilateral Distance:    Right Eye Near:   Left Eye Near:  Bilateral Near:     Physical Exam Vitals and nursing note reviewed.  Constitutional:      General: He is not in acute distress.    Appearance: He is well-developed. He is not ill-appearing.  HENT:     Head: Normocephalic and atraumatic.     Comments: Maxillary sinus tenderness.    Right Ear: Tympanic membrane normal.     Left Ear: Tympanic membrane normal.     Nose: Congestion and rhinorrhea present.     Mouth/Throat:     Mouth: Mucous membranes are moist.  Eyes:     Extraocular Movements: Extraocular movements intact.     Conjunctiva/sclera: Conjunctivae normal.     Pupils: Pupils are equal, round, and reactive to light.  Cardiovascular:     Rate and Rhythm: Normal rate and regular rhythm.     Heart sounds: No murmur heard.   Pulmonary:     Effort: Pulmonary effort is normal. No respiratory distress.     Breath sounds: Normal breath sounds. No wheezing, rhonchi or rales.  Abdominal:     Palpations: Abdomen is soft.     Tenderness: There is no abdominal tenderness.  Musculoskeletal:     Cervical back: Neck supple.  Skin:    General: Skin is warm and dry.  Neurological:     Mental Status: He is alert.      UC Treatments / Results  Labs (all labs ordered are listed, but only abnormal results are displayed) Labs Reviewed - No data to display  EKG   Radiology No results found.  Procedures Procedures (including critical care time)  Medications Ordered in UC Medications - No data to display  Initial Impression / Assessment and Plan / UC Course  I have reviewed the triage vital signs and the nursing notes.  Pertinent labs & imaging results that were available during my care of the patient  were reviewed by me and considered in my medical decision making (see chart for details).     #Maxillary sinusitis Patient is a 59 year old presenting with maxillary sinusitis.  Given greater than 10 days of symptoms with facial pain and pressure will start on Augmentin.  Patient currently on Flonase will encourage to continue.  Instructed patient to not utilize the prednisone for his sinuses this was not prescribed for his sinuses.  Will encourage use of nasal saline and Mucinex.  Discussed return, follow-up and emergency department precautions.  Patient verbalized understand plan of care Final Clinical Impressions(s) / UC Diagnoses   Final diagnoses:  Acute maxillary sinusitis, recurrence not specified     Discharge Instructions     Continue flonase Stop over-the-counter combination medicines  Start augmentin as prescribed Take Mucinex as Use nasal saline throughout the day  Follow-up with your primary care next week to discuss your other concerns about the prednisone as prescribed      ED Prescriptions    Medication Sig Dispense Auth. Provider   amoxicillin-clavulanate (AUGMENTIN) 875-125 MG tablet Take 1 tablet by mouth every 12 (twelve) hours for 10 days. 20 tablet Sylvana Bonk, Marguerita Beards, PA-C   sodium chloride (OCEAN) 0.65 % SOLN nasal spray Place 1 spray into both nostrils as needed for congestion. 88 mL Katheleen Stella, Marguerita Beards, PA-C   guaiFENesin (MUCINEX) 600 MG 12 hr tablet Take 1 tablet (600 mg total) by mouth 2 (two) times daily for 7 days. 14 tablet Stetson Pelaez, Marguerita Beards, PA-C     PDMP not reviewed this encounter.   Purnell Shoemaker, PA-C 02/07/20 2359

## 2020-02-07 NOTE — Discharge Instructions (Signed)
Continue flonase Stop over-the-counter combination medicines  Start augmentin as prescribed Take Mucinex as Use nasal saline throughout the day  Follow-up with your primary care next week to discuss your other concerns about the prednisone as prescribed

## 2020-03-10 ENCOUNTER — Other Ambulatory Visit: Payer: Medicare Other

## 2020-03-10 ENCOUNTER — Other Ambulatory Visit: Payer: Self-pay | Admitting: Internal Medicine

## 2020-03-10 ENCOUNTER — Ambulatory Visit: Payer: Medicare Other

## 2020-03-10 ENCOUNTER — Other Ambulatory Visit: Payer: Self-pay

## 2020-03-10 ENCOUNTER — Telehealth: Payer: Self-pay

## 2020-03-10 DIAGNOSIS — B20 Human immunodeficiency virus [HIV] disease: Secondary | ICD-10-CM

## 2020-03-10 NOTE — Telephone Encounter (Signed)
Patient called to inquire about medication changes that his PCP has recommended. Patient was unsure if the changes were ok and wanted to check with Dr. Orvan Falconer. PCP recommended discontinuing Fenofibrate but continuing on Zetia.   Patient is also going to try and reschedule appointment with Dr. Orvan Falconer due to COVID diagnosis back in August; wants to check in and make sure that everything is ok. Forwarded to front desk to reschedule.   Chiquetta Langner Loyola Mast, RN

## 2020-03-10 NOTE — Telephone Encounter (Signed)
Please let him know that stopping fenofibrate is okay.

## 2020-03-11 LAB — T-HELPER CELL (CD4) - (RCID CLINIC ONLY)
CD4 % Helper T Cell: 38 % (ref 33–65)
CD4 T Cell Abs: 496 /uL (ref 400–1790)

## 2020-03-12 LAB — CBC
HCT: 40 % (ref 38.5–50.0)
Hemoglobin: 13.4 g/dL (ref 13.2–17.1)
MCH: 30.3 pg (ref 27.0–33.0)
MCHC: 33.5 g/dL (ref 32.0–36.0)
MCV: 90.5 fL (ref 80.0–100.0)
MPV: 12.3 fL (ref 7.5–12.5)
Platelets: 169 10*3/uL (ref 140–400)
RBC: 4.42 10*6/uL (ref 4.20–5.80)
RDW: 11.7 % (ref 11.0–15.0)
WBC: 3.8 10*3/uL (ref 3.8–10.8)

## 2020-03-12 LAB — COMPREHENSIVE METABOLIC PANEL
AG Ratio: 1.3 (calc) (ref 1.0–2.5)
ALT: 17 U/L (ref 9–46)
AST: 17 U/L (ref 10–35)
Albumin: 4.3 g/dL (ref 3.6–5.1)
Alkaline phosphatase (APISO): 75 U/L (ref 35–144)
BUN: 16 mg/dL (ref 7–25)
CO2: 23 mmol/L (ref 20–32)
Calcium: 9.5 mg/dL (ref 8.6–10.3)
Chloride: 104 mmol/L (ref 98–110)
Creat: 1.24 mg/dL (ref 0.70–1.25)
Globulin: 3.3 g/dL (calc) (ref 1.9–3.7)
Glucose, Bld: 214 mg/dL — ABNORMAL HIGH (ref 65–99)
Potassium: 4.2 mmol/L (ref 3.5–5.3)
Sodium: 135 mmol/L (ref 135–146)
Total Bilirubin: 0.6 mg/dL (ref 0.2–1.2)
Total Protein: 7.6 g/dL (ref 6.1–8.1)

## 2020-03-12 LAB — HIV-1 RNA QUANT-NO REFLEX-BLD
HIV 1 RNA Quant: 31 Copies/mL — ABNORMAL HIGH
HIV-1 RNA Quant, Log: 1.49 Log cps/mL — ABNORMAL HIGH

## 2020-03-12 LAB — RPR: RPR Ser Ql: NONREACTIVE

## 2020-03-19 ENCOUNTER — Other Ambulatory Visit: Payer: Self-pay | Admitting: General Practice

## 2020-03-19 DIAGNOSIS — R5381 Other malaise: Secondary | ICD-10-CM

## 2020-03-31 ENCOUNTER — Ambulatory Visit (INDEPENDENT_AMBULATORY_CARE_PROVIDER_SITE_OTHER): Payer: Medicare Other | Admitting: Internal Medicine

## 2020-03-31 ENCOUNTER — Encounter: Payer: Self-pay | Admitting: Internal Medicine

## 2020-03-31 ENCOUNTER — Other Ambulatory Visit: Payer: Self-pay

## 2020-03-31 DIAGNOSIS — B20 Human immunodeficiency virus [HIV] disease: Secondary | ICD-10-CM | POA: Diagnosis not present

## 2020-03-31 DIAGNOSIS — Z794 Long term (current) use of insulin: Secondary | ICD-10-CM | POA: Diagnosis not present

## 2020-03-31 DIAGNOSIS — Z8616 Personal history of COVID-19: Secondary | ICD-10-CM | POA: Diagnosis not present

## 2020-03-31 DIAGNOSIS — E1165 Type 2 diabetes mellitus with hyperglycemia: Secondary | ICD-10-CM

## 2020-03-31 MED ORDER — BIKTARVY 50-200-25 MG PO TABS: 1.0000 | ORAL_TABLET | Freq: Every day | ORAL | 11 refills | Status: DC

## 2020-03-31 NOTE — Progress Notes (Signed)
Patient Active Problem List   Diagnosis Date Noted  . History of COVID-19 01/22/2020    Priority: High  . Human immunodeficiency virus (HIV) disease ([place]) 07/17/2008    Priority: High  . 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 Seasonal allergies 09/20/2011    Priority: Medium  . Polyarthritis 09/06/2011    Priority: Medium  . Type 2 diabetes mellitus with hyperglycemia, with long-term current use of insulin ([place]) 03/16/2009    Priority: Medium  . Hyperlipidemia 07/17/2008    Priority: Medium  . Hypertension 07/17/2008    Priority: Medium  . Right leg pain 05/15/2019  . 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 Depression 01/14/2013  . Rash and nonspecific skin eruption 10/11/2012  . Recurrent low back pain 10/11/2012  . Left facial numbness 08/20/2012    Patient's Medications  New Prescriptions   No medications on file  Previous Medications   ALCOHOL SWABS PADS    1 each by Does not apply route 3 (three) times daily before meals.   AMLODIPINE (NORVASC) 5 MG TABLET    TAKE 1 TABLET(5 MG) BY MOUTH DAILY   ATORVASTATIN (LIPITOR) 40 MG TABLET    Take 1 tablet (40 mg total) by mouth daily.   B-D UF III MINI PEN NEEDLES 31G X 5 MM MISC    USE TO INJECT AT BEDTIME   BLOOD GLUCOSE MONITORING SUPPL (ONETOUCH VERIO) W/DEVICE KIT    USE AS DIRECTED   CETIRIZINE (ZYRTEC) 10 MG TABLET    TAKE 1 TABLET(10 MG) BY MOUTH DAILY   DICLOFENAC SODIUM (VOLTAREN) 1 % GEL    Apply 2 g topically 4 (four) times daily.   EZETIMIBE (ZETIA) 10 MG TABLET    1 daily   FLUTICASONE (FLONASE) 50 MCG/ACT NASAL SPRAY    Place 2 sprays into both nostrils daily.   GLIPIZIDE (GLUCOTROL) 10 MG TABLET    TAKE 1 TABLET(10 MG) BY MOUTH TWICE DAILY BEFORE A MEAL   GLUCOSE BLOOD (ONETOUCH VERIO) TEST STRIP    Use as instructed   HYDROCORTISONE (ANUSOL-HC) 2.5 % RECTAL CREAM    Apply to rectal hemorrhoid BID PRN   HYDROCORTISONE CREAM 1 %    APP AA QID FOR 7 DAYS   HYDROCORTISONE-PRAMOXINE (ANALPRAM  HC) 2.5-1 % RECTAL CREAM    Place 1 application rectally 3 (three) times daily.   HYDROXYZINE (ATARAX/VISTARIL) 25 MG TABLET    Take 1 tablet (25 mg total) by mouth every 6 (six) hours.   INSULIN GLARGINE (LANTUS SOLOSTAR) 100 UNIT/ML SOLOSTAR PEN    Inject 35 Units into the skin at bedtime.   LANCET DEVICES (ACCU-CHEK SOFTCLIX) LANCETS    Use as instructed for 3 times daily testing of blood sugar.   LANTUS 100 UNIT/ML INJECTION       ONETOUCH DELICA LANCETS 65Y MISC    USE AS DIRECTED   PREDNISONE (STERAPRED UNI-PAK 21 TAB) 10 MG (21) TBPK TABLET    Take by mouth daily. Take 6 tabs by mouth daily  for 2 days, then 5 tabs for 2 days, then 4 tabs for 2 days, then 3 tabs for 2 days, 2 tabs for 2 days, then 1 tab by mouth daily for 2 days   SODIUM CHLORIDE (OCEAN) 0.65 % SOLN NASAL SPRAY    Place 1 spray into both nostrils as needed for congestion.   VALACYCLOVIR (VALTREX) 1000 MG TABLET    TAKE 1 TABLET(1000 MG) BY MOUTH TWICE  DAILY  Modified Medications   Modified Medication Previous Medication   BICTEGRAVIR-EMTRICITABINE-TENOFOVIR AF (BIKTARVY) 50-200-25 MG TABS TABLET BIKTARVY 50-200-25 MG TABS tablet      Take 1 tablet by mouth daily.    TAKE 1 TABLET BY MOUTH DAILY  Discontinued Medications   No medications on file    Subjective: Benjamin Hunter is in for his routine HIV follow-up visit.  He has not had any problems obtaining, taking or tolerating his Biktarvy.  He recalls missing only 2 doses.  He continues to recover from COVID-19 infection.  He still has a little bit of residual dizziness which is improving.  He took his first 2 Moderna Covid vaccines and is scheduled for his booster in 4 days.  He also has some sinus congestion which may be due to seasonal allergies.  Review of Systems: Review of Systems  Constitutional: Negative for chills, diaphoresis and fever.  Respiratory: Positive for cough. Negative for sputum production and shortness of breath.   Cardiovascular: Positive for chest  pain.       He occasionally has some mild no pain in his right upper chest when coughing.  Gastrointestinal: Negative for abdominal pain, diarrhea, nausea and vomiting.  Musculoskeletal: Positive for joint pain.  Psychiatric/Behavioral: Negative for depression.    Past Medical History:  Diagnosis Date  . Arthritis   . Depression   . Diabetes mellitus   . HIV (human immunodeficiency virus infection) ([place])   . Hyperlipidemia   . Hypertension     Social History   Tobacco Use  . Smoking status: Never Smoker  . Smokeless tobacco: Never Used  Vaping Use  . Vaping Use: Never used  Substance Use Topics  . Alcohol use: Yes    Alcohol/week: 21.0 standard drinks    Types: 21 Cans of beer per week  . Drug use: No    Family History  Problem Relation Age of Onset  . Hypertension Mother   . Heart failure Father     Allergies  Allergen Reactions  . Shrimp [Shellfish Allergy] Nausea And Vomiting  . Lisinopril Rash    Palmar rash     Health Maintenance  Topic Date Due  . COLONOSCOPY  Never done  . OPHTHALMOLOGY EXAM  04/13/2016  . FOOT EXAM  12/20/2018  . URINE MICROALBUMIN  04/19/2019  . HEMOGLOBIN A1C  05/15/2019  . INFLUENZA VACCINE  12/29/2019  . TETANUS/TDAP  07/05/2027  . PNEUMOCOCCAL POLYSACCHARIDE VACCINE AGE 67-64 HIGH RISK  Completed  . COVID-19 Vaccine  Completed  . Hepatitis C Screening  Completed  . HIV Screening  Completed    Objective:  Vitals:   03/31/20 1416  BP: 137/78  Pulse: 89  SpO2: 99%  Weight: 203 lb (92.1 kg)   Body mass index is 28.31 kg/m.  Physical Exam Constitutional:      Comments: His spirits are good as usual.  Cardiovascular:     Rate and Rhythm: Normal rate and regular rhythm.     Heart sounds: No murmur heard.   Pulmonary:     Effort: Pulmonary effort is normal.     Breath sounds: Normal breath sounds.  Abdominal:     Palpations: Abdomen is soft.     Tenderness: There is no abdominal tenderness.  Skin:    Findings:  No rash.  Neurological:     General: No focal deficit present.  Psychiatric:        Mood and Affect: Mood normal.     Lab Results Lab Results  Component  Value Date   WBC 3.8 03/10/2020   HGB 13.4 03/10/2020   HCT 40.0 03/10/2020   MCV 90.5 03/10/2020   PLT 169 03/10/2020    Lab Results  Component Value Date   CREATININE 1.24 03/10/2020   BUN 16 03/10/2020   NA 135 03/10/2020   K 4.2 03/10/2020   CL 104 03/10/2020   CO2 23 03/10/2020    Lab Results  Component Value Date   ALT 17 03/10/2020   AST 17 03/10/2020   ALKPHOS 72 11/13/2018   BILITOT 0.6 03/10/2020    Lab Results  Component Value Date   CHOL 129 12/20/2017   HDL 43 12/20/2017   LDLCALC 72 12/20/2017   TRIG 72 12/20/2017   CHOLHDL 3.0 12/20/2017   Lab Results  Component Value Date   LABRPR NON-REACTIVE 03/10/2020   HIV 1 RNA Quant  Date Value  03/10/2020 31 Copies/mL (H)  05/01/2019 <20 NOT DETECTED copies/mL  04/16/2018 29 copies/mL (H)   CD4 T Cell Abs (/uL)  Date Value  03/10/2020 496  05/01/2019 488  04/16/2018 480     Problem List Items Addressed This Visit      High   Human immunodeficiency virus (HIV) disease ([place])    His infection remains under very good long-term control.  He will continue Biktarvy and follow-up after lab work in 6 months.      Relevant Medications   bictegravir-emtricitabine-tenofovir AF (BIKTARVY) 50-200-25 MG TABS tablet   Other Relevant Orders   T-helper cell (CD4)- ([facility] clinic only)   HIV-1 RNA quant-no reflex-bld   CBC   Comprehensive metabolic panel   Lipid panel   RPR   History of COVID-19    He has recovered fairly uneventfully from COVID-19 infection.  I agreed with him getting his booster soon.        Medium   Type 2 diabetes mellitus with hyperglycemia, with long-term current use of insulin ([facility])    His blood sugars remain poorly controlled.  I told him that there should be his #1 priority for the coming year.           Benjamin Bickers, MD [facility] [place] [facility] [phone] pager   [phone] cell 03/31/2020, 2:42 PM

## 2020-03-31 NOTE — Assessment & Plan Note (Signed)
He has recovered fairly uneventfully from COVID-19 infection.  I agreed with him getting his booster soon.

## 2020-03-31 NOTE — Assessment & Plan Note (Signed)
His infection remains under very good long-term control.  He will continue Biktarvy and follow-up after lab work in 6 months.

## 2020-03-31 NOTE — Assessment & Plan Note (Signed)
His blood sugars remain poorly controlled.  I told him that there should be his #1 priority for the coming year.

## 2020-04-06 ENCOUNTER — Other Ambulatory Visit: Payer: Self-pay | Admitting: Family Medicine

## 2020-04-10 ENCOUNTER — Encounter (HOSPITAL_COMMUNITY): Payer: Self-pay | Admitting: Emergency Medicine

## 2020-04-10 ENCOUNTER — Other Ambulatory Visit: Payer: Self-pay

## 2020-04-10 ENCOUNTER — Ambulatory Visit (HOSPITAL_COMMUNITY)
Admission: EM | Admit: 2020-04-10 | Discharge: 2020-04-10 | Disposition: A | Discharge status: Home / Self Care | Payer: Medicare Other | Attending: Family Medicine | Admitting: Family Medicine

## 2020-04-10 ENCOUNTER — Ambulatory Visit (INDEPENDENT_AMBULATORY_CARE_PROVIDER_SITE_OTHER): Payer: Medicare Other

## 2020-04-10 DIAGNOSIS — R1011 Right upper quadrant pain: Secondary | ICD-10-CM

## 2020-04-10 DIAGNOSIS — K5900 Constipation, unspecified: Secondary | ICD-10-CM | POA: Diagnosis present

## 2020-04-10 LAB — COMPREHENSIVE METABOLIC PANEL
ALT: 21 U/L (ref 0–44)
AST: 23 U/L (ref 15–41)
Albumin: 4.1 g/dL (ref 3.5–5.0)
Alkaline Phosphatase: 67 U/L (ref 38–126)
Anion gap: 9 (ref 5–15)
BUN: 24 mg/dL — ABNORMAL HIGH (ref 6–20)
CO2: 22 mmol/L (ref 22–32)
Calcium: 9.9 mg/dL (ref 8.9–10.3)
Chloride: 102 mmol/L (ref 98–111)
Creatinine, Ser: 1.43 mg/dL — ABNORMAL HIGH (ref 0.61–1.24)
GFR, Estimated: 56 mL/min — ABNORMAL LOW (ref >60)
Glucose, Bld: 198 mg/dL — ABNORMAL HIGH (ref 70–99)
Potassium: 4.3 mmol/L (ref 3.5–5.1)
Sodium: 133 mmol/L — ABNORMAL LOW (ref 135–145)
Total Bilirubin: 0.8 mg/dL (ref 0.3–1.2)
Total Protein: 8.2 g/dL — ABNORMAL HIGH (ref 6.5–8.1)

## 2020-04-10 LAB — CBC
HCT: 41.4 % (ref 39.0–52.0)
Hemoglobin: 13.6 g/dL (ref 13.0–17.0)
MCH: 30.4 pg (ref 26.0–34.0)
MCHC: 32.9 g/dL (ref 30.0–36.0)
MCV: 92.4 fL (ref 80.0–100.0)
Platelets: 183 10*3/uL (ref 150–400)
RBC: 4.48 MIL/uL (ref 4.22–5.81)
RDW: 12.2 % (ref 11.5–15.5)
WBC: 4.4 10*3/uL (ref 4.0–10.5)
nRBC: 0 % (ref 0.0–0.2)

## 2020-04-10 LAB — POCT URINALYSIS DIPSTICK, ED / UC
Bilirubin Urine: NEGATIVE
Glucose, UA: 500 mg/dL — AB
Hgb urine dipstick: NEGATIVE
Ketones, ur: NEGATIVE mg/dL
Leukocytes,Ua: NEGATIVE
Nitrite: NEGATIVE
Protein, ur: 30 mg/dL — AB
Specific Gravity, Urine: 1.02 (ref 1.005–1.030)
Urobilinogen, UA: 0.2 mg/dL (ref 0.0–1.0)
pH: 5 (ref 5.0–8.0)

## 2020-04-10 LAB — AMYLASE: Amylase: 69 U/L (ref 28–100)

## 2020-04-10 LAB — LIPASE, BLOOD: Lipase: 36 U/L (ref 11–51)

## 2020-04-10 MED ORDER — POLYETHYLENE GLYCOL 3350 17 G PO PACK: 17.0000 g | PACK | Freq: Every day | ORAL | 0 refills | Status: DC | PRN

## 2020-04-10 NOTE — ED Triage Notes (Signed)
Patient c/o sharp pain in RUQ of ABD and RT sided flank pain x 4 days.   Patient denies SOB or cough.  Patient endorses constipation x 1 month.  Patient started taking OTC stool softener for constipation.   Patient endorses seasonal sinus drainage.  Patient has taken OTC "mucus relief and cold medication" for sinus drainage.   Patient has history of back problems.

## 2020-04-10 NOTE — ED Provider Notes (Signed)
[place] [place]    CSN: [id number] Arrival date & time: 04/10/20  0801      History   Chief Complaint Chief Complaint  Patient presents with  . Abdominal Pain    HPI Benjamin Hunter is a 59 y.o. male.   HPI  Patient presents today with a history of HIV, type II uncontrolled diabetes hypertension hyperlipidemia with a concern of right upper quadrant pain x2 days which radiates to his upper mid back.  He denies any associated nausea, vomiting or diarrhea.  Endorses a history of chronic constipation.  Only changes as he has been having some sinus congestion type symptoms and was concerned that this may be related to the abdominal pain.  He is not having any left-sided abdominal pain symptoms.  Denies any dysuria.  He last saw his healthcare provider and infectious disease x10 days ago.  He has not had an actual colonoscopy however had a negative stool DNA test a few months ago at his PCP office.  He sees his PCP Dr. Posey Pronto at [facility] in [place].  He is afebrile and has a history of Covid back in August 2021.  Past Medical History:  Diagnosis Date  . Arthritis   . Depression   . Diabetes mellitus   . HIV (human immunodeficiency virus infection) ([place])   . Hyperlipidemia   . Hypertension     Patient Active Problem List   Diagnosis Date Noted  . History of COVID-19 01/22/2020  . Right leg pain 05/15/2019  . 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 Depression 01/14/2013  . Rash and nonspecific skin eruption 10/11/2012  . Recurrent low back pain 10/11/2012  . Left facial numbness 08/20/2012  . Seasonal allergies 09/20/2011  . Polyarthritis 09/06/2011  . Type 2 diabetes mellitus with hyperglycemia, with long-term current use of insulin ([place]) 03/16/2009  . Human immunodeficiency virus (HIV) disease ([place]) 07/17/2008  . Hyperlipidemia 07/17/2008  . Hypertension 07/17/2008    History reviewed. No pertinent surgical  history.     Home Medications    Prior to Admission medications   Medication Sig Start Date End Date Taking? Authorizing Provider  Alcohol Swabs PADS 1 each by Does not apply route 3 (three) times daily before meals. 11/13/18  Yes Newlin, Enobong, MD  amLODipine (NORVASC) 5 MG tablet TAKE 1 TABLET(5 MG) BY MOUTH DAILY 11/13/18  Yes Newlin, Enobong, MD  atorvastatin (LIPITOR) 40 MG tablet Take 1 tablet (40 mg total) by mouth daily. 11/13/18  Yes Newlin, Charlane Ferretti, MD  B-D UF III MINI PEN NEEDLES 31G X 5 MM MISC USE TO INJECT AT BEDTIME 06/21/18  Yes Newlin, Enobong, MD  bictegravir-emtricitabine-tenofovir AF (BIKTARVY) 50-200-25 MG TABS tablet Take 1 tablet by mouth daily. 03/31/20  Yes Michel Bickers, MD  Blood Glucose Monitoring Suppl [facility] VERIO) w/Device KIT USE AS DIRECTED 03/14/18  Yes Newlin, Enobong, MD  fluticasone (FLONASE) 50 MCG/ACT nasal spray Place 2 sprays into both nostrils daily. 11/13/18  Yes Newlin, Enobong, MD  glipiZIDE (GLUCOTROL) 10 MG tablet TAKE 1 TABLET(10 MG) BY MOUTH TWICE DAILY BEFORE A MEAL 11/13/18  Yes Newlin, Enobong, MD  glucose blood (ONETOUCH VERIO) test strip Use as instructed 03/14/18  Yes Charlott Rakes, MD  Lancet Devices [facility]) lancets Use as instructed for 3 times daily testing of blood sugar. 12/07/15  Yes Charlott Rakes, MD  LANTUS 100 UNIT/ML injection  08/14/19  Yes [provider]  [facility] DELICA LANCETS 17O MISC USE AS  DIRECTED 03/14/18  Yes Newlin, Charlane Ferretti, MD  cetirizine (ZYRTEC) 10 MG tablet TAKE 1 TABLET(10 MG) BY MOUTH DAILY 11/13/18   Charlott Rakes, MD  diclofenac sodium (VOLTAREN) 1 % GEL Apply 2 g topically 4 (four) times daily. 09/08/18   Robyn Haber, MD  ezetimibe (ZETIA) 10 MG tablet 1 daily Patient not taking: Reported on 03/31/2020 11/13/18   Charlott Rakes, MD  hydrocortisone (ANUSOL-HC) 2.5 % rectal cream Apply to rectal hemorrhoid BID PRN 08/20/18   Ladell Pier, MD  hydrocortisone cream 1 % APP AA  QID FOR 7 DAYS 05/21/19   [provider]  hydrocortisone-pramoxine (ANALPRAM HC) 2.5-1 % rectal cream Place 1 application rectally 3 (three) times daily. 08/14/18   Charlott Rakes, MD  hydrOXYzine (ATARAX/VISTARIL) 25 MG tablet Take 1 tablet (25 mg total) by mouth every 6 (six) hours. 11/22/19   Marney Setting, NP  Insulin Glargine (LANTUS SOLOSTAR) 100 UNIT/ML Solostar Pen Inject 35 Units into the skin at bedtime. 03/29/19   Charlott Rakes, MD  polyethylene glycol (MIRALAX) 17 g packet Take 17 g by mouth daily as needed for moderate constipation or severe constipation. 04/10/20   Scot Jun, FNP  predniSONE (STERAPRED UNI-PAK 21 TAB) 10 MG (21) TBPK tablet Take by mouth daily. Take 6 tabs by mouth daily  for 2 days, then 5 tabs for 2 days, then 4 tabs for 2 days, then 3 tabs for 2 days, 2 tabs for 2 days, then 1 tab by mouth daily for 2 days 11/22/19   Marney Setting, NP  sodium chloride (OCEAN) 0.65 % SOLN nasal spray Place 1 spray into both nostrils as needed for congestion. Patient not taking: Reported on 03/31/2020 02/07/20   Darr, Edison Nasuti, PA-C  valACYclovir (VALTREX) 1000 MG tablet TAKE 1 TABLET(1000 MG) BY MOUTH TWICE DAILY 10/01/18   Charlott Rakes, MD    Family History Family History  Problem Relation Age of Onset  . Hypertension Mother   . Heart failure Father     Social History Social History   Tobacco Use  . Smoking status: Never Smoker  . Smokeless tobacco: Never Used  Vaping Use  . Vaping Use: Never used  Substance Use Topics  . Alcohol use: Yes    Alcohol/week: 21.0 standard drinks    Types: 21 Cans of beer per week  . Drug use: No     Allergies   Shrimp [shellfish allergy] and Lisinopril  Review of Systems Review of Systems Pertinent negatives listed in HPI  Physical Exam Triage Vital Signs ED Triage Vitals  Enc Vitals Group     BP 04/10/20 0824 122/77     Pulse Rate 04/10/20 0824 79     Resp 04/10/20 0824 12     Temp 04/10/20  0824 98.4 F (36.9 C)     Temp Source 04/10/20 0824 Oral     SpO2 04/10/20 0824 100 %     Weight --      Height --      Head Circumference --      Peak Flow --      Pain Score 04/10/20 0819 4     Pain Loc --      Pain Edu? --      Excl. in [place]? --    No data found.  Updated Vital Signs BP 122/77 (BP Location: Right Arm)   Pulse 79   Temp 98.4 F (36.9 C) (Oral)   Resp 12   SpO2 100%   Visual Acuity  Right Eye Distance:   Left Eye Distance:   Bilateral Distance:    Right Eye Near:   Left Eye Near:    Bilateral Near:     Physical Exam Constitutional:      Appearance: He is not ill-appearing.  HENT:     Head: Normocephalic.  Eyes:     Extraocular Movements: Extraocular movements intact.  Cardiovascular:     Rate and Rhythm: Normal rate and regular rhythm.  Pulmonary:     Effort: Pulmonary effort is normal. No respiratory distress.     Breath sounds: Normal breath sounds. No wheezing.  Abdominal:     Palpations: Abdomen is soft.     Tenderness: There is abdominal tenderness in the right upper quadrant.  Skin:    General: Skin is warm and dry.  Neurological:     General: No focal deficit present.     Mental Status: He is alert.  Psychiatric:        Mood and Affect: Mood normal.        Behavior: Behavior normal.      UC Treatments / Results  Labs (all labs ordered are listed, but only abnormal results are displayed) Labs Reviewed  CBC  COMPREHENSIVE METABOLIC PANEL  LIPASE, BLOOD  AMYLASE  POCT URINALYSIS DIPSTICK, ED / UC    EKG   Radiology No results found.  Procedures Procedures (including critical care time)  Medications Ordered in UC Medications - No data to display  Initial Impression / Assessment and Plan / UC Course  I have reviewed the triage vital signs and the nursing notes.  Pertinent labs & imaging results that were available during my care of the patient were reviewed by me and considered in my medical decision making (see  chart for details).    Abdominal series, BMP, CBC and UA pending.  Patient advised will update regarding results via MyChart.  Prescribed MiraLAX based on history of chronic constipation for as needed daily use to relieve constipation type symptoms.  Overall physical exam is reassuring however patient has not had his labs completed during his last visit at PCP office and would like to rule out any pancreatic or liver involvement as a source of his abdominal pain. Final Clinical Impressions(s) / UC Diagnoses   Final diagnoses:  RUQ abdominal pain  Constipation, unspecified constipation type     Discharge Instructions     I will send you my chart message advising of any findings related to your abdominal x-ray.  I will get your lab results back later in the evening I will also send you a message of any additional follow-up or of any abnormal findings that require immediate treatment.  Otherwise I would like for you to use the MiraLAX as a suspect some of your symptoms are related to stool impaction related to constipation.  Drink plenty of water at least 6 to 8 glasses a day to also help relieve constipation.   ED Prescriptions    Medication Sig Dispense Auth. Provider   polyethylene glycol (MIRALAX) 17 g packet Take 17 g by mouth daily as needed for moderate constipation or severe constipation. 85 each Scot Jun, FNP     PDMP not reviewed this encounter.   Scot Jun, [place] 04/10/20 [phone]

## 2020-04-10 NOTE — Discharge Instructions (Addendum)
I will send you my chart message advising of any findings related to your abdominal x-ray.  I will get your lab results back later in the evening I will also send you a message of any additional follow-up or of any abnormal findings that require immediate treatment.  Otherwise I would like for you to use the MiraLAX as a suspect some of your symptoms are related to stool impaction related to constipation.  Drink plenty of water at least 6 to 8 glasses a day to also help relieve constipation.

## 2020-04-13 ENCOUNTER — Other Ambulatory Visit: Payer: Self-pay

## 2020-04-13 ENCOUNTER — Ambulatory Visit (HOSPITAL_COMMUNITY)
Admission: EM | Admit: 2020-04-13 | Discharge: 2020-04-13 | Discharge status: Against Medical Advice | Payer: Medicare Other

## 2020-04-13 NOTE — ED Notes (Signed)
Patient presents to [facility] for recheck of bloodwork.  This RN spoke to him Friday about needing to follow-up with PCP.  Went to speak to patient while he was being triaged, and patient has follow-up appointment scheduled for tomorrow, but he was anxious about the results from Friday.  This RN gave him the option of staying to be seen or following up with his PCP.  Explained the rationale for allowing his PCP to manage his high blood sugars and other symptoms.  Patient states he was hoping we could give him a shot of insulin tonight.  This RN explained that with his follow-up scheduled for tomorrow we would more than likely not address, but that he could wait to speak to a provider if he wanted to.  Patient states he will follow up with his PCP tomorrow.  ER precautions reviewed with patient.

## 2020-05-04 ENCOUNTER — Other Ambulatory Visit: Payer: Medicare Other

## 2020-05-19 ENCOUNTER — Other Ambulatory Visit: Payer: Self-pay

## 2020-05-19 ENCOUNTER — Encounter: Payer: Medicare Other | Admitting: Internal Medicine

## 2020-05-19 ENCOUNTER — Encounter (HOSPITAL_COMMUNITY): Payer: Self-pay

## 2020-05-19 ENCOUNTER — Ambulatory Visit (HOSPITAL_COMMUNITY)
Admission: EM | Admit: 2020-05-19 | Discharge: 2020-05-19 | Disposition: A | Discharge status: Home / Self Care | Payer: Medicare Other | Attending: Student | Admitting: Student

## 2020-05-19 DIAGNOSIS — R109 Unspecified abdominal pain: Secondary | ICD-10-CM

## 2020-05-19 LAB — POCT URINALYSIS DIPSTICK, ED / UC
Bilirubin Urine: NEGATIVE
Glucose, UA: 500 mg/dL — AB
Hgb urine dipstick: NEGATIVE
Ketones, ur: NEGATIVE mg/dL
Leukocytes,Ua: NEGATIVE
Nitrite: NEGATIVE
Protein, ur: NEGATIVE mg/dL
Specific Gravity, Urine: 1.015 (ref 1.005–1.030)
Urobilinogen, UA: 1 mg/dL (ref 0.0–1.0)
pH: 5.5 (ref 5.0–8.0)

## 2020-05-19 LAB — CBG MONITORING, ED: Glucose-Capillary: 276 mg/dL — ABNORMAL HIGH (ref 70–99)

## 2020-05-19 NOTE — ED Triage Notes (Signed)
Pt presents with right flank pain, left flank pain x 1 month. Denies fever, nausea, abdominal pain, dysuria.

## 2020-05-19 NOTE — Discharge Instructions (Addendum)
Your labwork looks good. Please make an appointment with your primary care provider in 1-2 weeks to discuss your symptoms. Seek immediate medical attention if your pain worsens; if you develops fevers/chills; if you develop urinary symptoms; chest pain; shortness of breath.

## 2020-05-19 NOTE — ED Provider Notes (Signed)
[place]    CSN: [id number] Arrival date & time: 05/19/20  2951      History   Chief Complaint Chief Complaint  Patient presents with  . Flank Pain    HPI Benjamin Hunter is a 59 y.o. male presenting with bilateral side/back pain for 1 month. He was previously seen for these symptoms 1 month ago at this clinic. He has a history of HIV, type II uncontrolled diabetes hypertension hyperlipidemia. Today he again presents with complaint of RUQ pain that radiates to back. Also states he has intermittent LUQ pain that radiates to side. He denies any associated nausea, vomiting or diarrhea.  Endorses a history of chronic constipation, controlled on stool softener.  He has not had an actual colonoscopy however had a negative stool DNA test a few months ago at his PCP office. He sees his PCP Dr. Posey Pronto at [facility] in [place].  He is afebrile and has a history of Covid back in August 2021. He states that he has no history of liver, gallbladder disease. He drinks 2 beers a night. He is having regular bowel movements on stool softener.states his sugars have been running 199 at home due to eating more sweets lately. Denies hematuria, dysuria, frequency, urgency, back pain, n/v/d, fevers/chills, abdnormal discharge.   HPI  Past Medical History:  Diagnosis Date  . Arthritis   . Depression   . Diabetes mellitus   . HIV (human immunodeficiency virus infection) ([place])   . Hyperlipidemia   . Hypertension     Patient Active Problem List   Diagnosis Date Noted  . History of COVID-19 01/22/2020  . Right leg pain 05/15/2019  . 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 Depression 01/14/2013  . Rash and nonspecific skin eruption 10/11/2012  . Recurrent low back pain 10/11/2012  . Left facial numbness 08/20/2012  . Seasonal allergies 09/20/2011  . Polyarthritis 09/06/2011  . Type 2 diabetes mellitus with hyperglycemia, with long-term  current use of insulin ([place]) 03/16/2009  . Human immunodeficiency virus (HIV) disease ([place]) 07/17/2008  . Hyperlipidemia 07/17/2008  . Hypertension 07/17/2008    History reviewed. No pertinent surgical history.     Home Medications    Prior to Admission medications   Medication Sig Start Date End Date Taking? Authorizing Provider  Alcohol Swabs PADS 1 each by Does not apply route 3 (three) times daily before meals. 11/13/18   Charlott Rakes, MD  amLODipine (NORVASC) 5 MG tablet TAKE 1 TABLET(5 MG) BY MOUTH DAILY 11/13/18   Charlott Rakes, MD  atorvastatin (LIPITOR) 40 MG tablet Take 1 tablet (40 mg total) by mouth daily. 11/13/18   Charlott Rakes, MD  B-D UF III MINI PEN NEEDLES 31G X 5 MM MISC USE TO INJECT AT BEDTIME 06/21/18   Charlott Rakes, MD  bictegravir-emtricitabine-tenofovir AF (BIKTARVY) 50-200-25 MG TABS tablet Take 1 tablet by mouth daily. 03/31/20   Michel Bickers, MD  Blood Glucose Monitoring Suppl [facility] VERIO) w/Device KIT USE AS DIRECTED 03/14/18   Charlott Rakes, MD  cetirizine (ZYRTEC) 10 MG tablet TAKE 1 TABLET(10 MG) BY MOUTH DAILY 11/13/18   Charlott Rakes, MD  diclofenac sodium (VOLTAREN) 1 % GEL Apply 2 g topically 4 (four) times daily. 09/08/18   Robyn Haber, MD  ezetimibe (ZETIA) 10 MG tablet 1 daily Patient not taking: No sig reported 11/13/18   Charlott Rakes, MD  fluticasone (FLONASE) 50 MCG/ACT nasal spray Place 2 sprays into both nostrils daily. 11/13/18  Charlott Rakes, MD  glipiZIDE (GLUCOTROL) 10 MG tablet TAKE 1 TABLET(10 MG) BY MOUTH TWICE DAILY BEFORE A MEAL 11/13/18   Charlott Rakes, MD  glucose blood (ONETOUCH VERIO) test strip Use as instructed 03/14/18   Charlott Rakes, MD  hydrocortisone (ANUSOL-HC) 2.5 % rectal cream Apply to rectal hemorrhoid BID PRN 08/20/18   Ladell Pier, MD  hydrocortisone cream 1 % APP AA QID FOR 7 DAYS 05/21/19   [provider]  hydrocortisone-pramoxine (ANALPRAM HC) 2.5-1 % rectal cream Place 1  application rectally 3 (three) times daily. 08/14/18   Charlott Rakes, MD  hydrOXYzine (ATARAX/VISTARIL) 25 MG tablet Take 1 tablet (25 mg total) by mouth every 6 (six) hours. 11/22/19   Marney Setting, NP  Insulin Glargine (LANTUS SOLOSTAR) 100 UNIT/ML Solostar Pen Inject 35 Units into the skin at bedtime. 03/29/19   Charlott Rakes, MD  Lancet Devices [facility]) lancets Use as instructed for 3 times daily testing of blood sugar. 12/07/15   Charlott Rakes, MD  LANTUS 100 UNIT/ML injection  08/14/19   [provider]  Jonetta Speak LANCETS 40J MISC USE AS DIRECTED 03/14/18   Charlott Rakes, MD  polyethylene glycol (MIRALAX) 17 g packet Take 17 g by mouth daily as needed for moderate constipation or severe constipation. 04/10/20   Scot Jun, FNP  predniSONE (STERAPRED UNI-PAK 21 TAB) 10 MG (21) TBPK tablet Take by mouth daily. Take 6 tabs by mouth daily  for 2 days, then 5 tabs for 2 days, then 4 tabs for 2 days, then 3 tabs for 2 days, 2 tabs for 2 days, then 1 tab by mouth daily for 2 days 11/22/19   Marney Setting, NP  sodium chloride (OCEAN) 0.65 % SOLN nasal spray Place 1 spray into both nostrils as needed for congestion. Patient not taking: No sig reported 02/07/20   Darr, Edison Nasuti, PA-C  valACYclovir (VALTREX) 1000 MG tablet TAKE 1 TABLET(1000 MG) BY MOUTH TWICE DAILY 10/01/18   Charlott Rakes, MD    Family History Family History  Problem Relation Age of Onset  . Hypertension Mother   . Heart failure Father     Social History Social History   Tobacco Use  . Smoking status: Never Smoker  . Smokeless tobacco: Never Used  Vaping Use  . Vaping Use: Never used  Substance Use Topics  . Alcohol use: Yes    Alcohol/week: 21.0 standard drinks    Types: 21 Cans of beer per week  . Drug use: No     Allergies   Shrimp [shellfish allergy] and Lisinopril   Review of Systems Review of Systems  Genitourinary: Positive for flank pain.  All other  systems reviewed and are negative.    Physical Exam Triage Vital Signs ED Triage Vitals  Enc Vitals Group     BP      Pulse      Resp      Temp      Temp src      SpO2      Weight      Height      Head Circumference      Peak Flow      Pain Score      Pain Loc      Pain Edu?      Excl. in [place]?    No data found.  Updated Vital Signs BP 125/74 (BP Location: Right Arm)   Pulse 70   Temp 97.8 F (36.6 C) (Oral)  Resp 18   SpO2 100%   Visual Acuity Right Eye Distance:   Left Eye Distance:   Bilateral Distance:    Right Eye Near:   Left Eye Near:    Bilateral Near:     Physical Exam Vitals reviewed.  Constitutional:      General: He is not in acute distress.    Appearance: Normal appearance. He is not ill-appearing.  HENT:     Head: Normocephalic and atraumatic.  Cardiovascular:     Rate and Rhythm: Normal rate and regular rhythm.     Heart sounds: Normal heart sounds.  Pulmonary:     Effort: Pulmonary effort is normal.     Breath sounds: Normal breath sounds. No wheezing, rhonchi or rales.  Abdominal:     General: Bowel sounds are normal. There is no distension.     Palpations: Abdomen is soft. There is no mass.     Tenderness: There is abdominal tenderness in the right upper quadrant and left upper quadrant. There is no right CVA tenderness, left CVA tenderness, guarding or rebound. Negative signs include Murphy's sign, Rovsing's sign and McBurney's sign.     Comments: Bowel sounds positive in all 4 quadrants. Mild RUQ and LUQ tenderness to deep palpation. Negative Murphy Sign, Rovsing's sign, McBurney point tenderness.   Neurological:     General: No focal deficit present.     Mental Status: He is alert and oriented to person, place, and time.  Psychiatric:        Mood and Affect: Mood is anxious.        Behavior: Behavior normal.        Thought Content: Thought content normal.        Judgment: Judgment normal.      UC Treatments / Results   Labs (all labs ordered are listed, but only abnormal results are displayed) Labs Reviewed  POCT URINALYSIS DIPSTICK, ED / UC - Abnormal; Notable for the following components:      Result Value   Glucose, UA 500 (*)    All other components within normal limits  POCT URINALYSIS DIPSTICK, ED / UC  CBG MONITORING, ED    EKG   Radiology No results found.  Procedures Procedures (including critical care time)  Medications Ordered in UC Medications - No data to display  Initial Impression / Assessment and Plan / UC Course  I have reviewed the triage vital signs and the nursing notes.  Pertinent labs & imaging results that were available during my care of the patient were reviewed by me and considered in my medical decision making (see chart for details).      Pt presents again today with RUQ pain radiating to back, as well as occ LUQ discomfort. UA today wnl except for elevated glucose. Pt is a known diabetic; CBG today 276. He was previously seen for these symptoms 1 month ago (04/10/20); at that visit, labs drawn: amylase, lipase- normal. CMP with cr 1.43. CBC wnl. DG abd normal. He has not had an actual colonoscopy however had a negative stool DNA test a few months ago at his PCP office (per pt).    Discussed above lab results. Continue stool softener for constipation. Low risk of abd emergency, based on history and exam. Negative for perotineal signs. Rec symptomatic and supportive care. Discussed strict return precautions- worsening abd pain, intractable n/v/d, inability to keep fluids down, new fevers, etc. Patient verbalized understanding and is agreeable with plan. Strongly rec PCP f/u for further  evaluation of these symptoms. He sees his PCP Dr. Posey Pronto at [facility] in [place].   Seek immediate medical attention of worsening of RUQ or LUQ pain; fevers/chills; worsening abd pain or back pain; chest pain; shortness of breath; new urinary sx; etc.    Continue monitoring blood glucose at home. Continue Lantus, glipizide.  Final Clinical Impressions(s) / UC Diagnoses   Final diagnoses:  Right flank pain     Discharge Instructions     Your labwork looks good. Please make an appointment with your primary care provider in 1-2 weeks to discuss your symptoms. Seek immediate medical attention if your pain worsens; if you develops fevers/chills; if you develop urinary symptoms; chest pain; shortness of breath.     ED Prescriptions    None     PDMP not reviewed this encounter.   Hazel Sams, PA-C 05/19/20 [phone]

## 2020-06-06 ENCOUNTER — Ambulatory Visit (HOSPITAL_COMMUNITY)
Admission: EM | Admit: 2020-06-06 | Discharge: 2020-06-06 | Disposition: A | Discharge status: Home / Self Care | Payer: Medicare HMO | Attending: Physician Assistant | Admitting: Physician Assistant

## 2020-06-06 ENCOUNTER — Encounter (HOSPITAL_COMMUNITY): Payer: Self-pay | Admitting: Emergency Medicine

## 2020-06-06 DIAGNOSIS — Z20822 Contact with and (suspected) exposure to covid-19: Secondary | ICD-10-CM | POA: Insufficient documentation

## 2020-06-06 DIAGNOSIS — J0100 Acute maxillary sinusitis, unspecified: Secondary | ICD-10-CM | POA: Insufficient documentation

## 2020-06-06 LAB — SARS CORONAVIRUS 2 (TAT 6-24 HRS): SARS Coronavirus 2: NEGATIVE

## 2020-06-06 MED ORDER — AMOXICILLIN 875 MG PO TABS: 875.0000 mg | ORAL_TABLET | Freq: Two times a day (BID) | ORAL | 0 refills | Status: DC

## 2020-06-06 NOTE — ED Provider Notes (Signed)
[place]    CSN: [id number] Arrival date & time: 06/06/20  1113      History   Chief Complaint Chief Complaint  Patient presents with  . Nasal Congestion  . Cough    HPI Benjamin Hunter is a 60 y.o. male.   The history is provided by the patient. No language interpreter was used.  Cough Cough characteristics:  Non-productive Sputum characteristics:  Nondescript Severity:  Moderate Onset quality:  Gradual Duration:  5 days Progression:  Worsening Smoker: no   Relieved by:  Nothing Worsened by:  Nothing Ineffective treatments:  None tried Pt reports he has sinus congestion and pressure.  Pt reports he has a histroy of sinus infection.  Pt reports he had covid 5 months ago.  Pt has had 3 vaccines   Past Medical History:  Diagnosis Date  . Arthritis   . Depression   . Diabetes mellitus   . HIV (human immunodeficiency virus infection) ([place])   . Hyperlipidemia   . Hypertension     Patient Active Problem List   Diagnosis Date Noted  . History of COVID-19 01/22/2020  . Right leg pain 05/15/2019  . 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 Depression 01/14/2013  . Rash and nonspecific skin eruption 10/11/2012  . Recurrent low back pain 10/11/2012  . Left facial numbness 08/20/2012  . Seasonal allergies 09/20/2011  . Polyarthritis 09/06/2011  . Type 2 diabetes mellitus with hyperglycemia, with long-term current use of insulin ([place]) 03/16/2009  . Human immunodeficiency virus (HIV) disease ([place]) 07/17/2008  . Hyperlipidemia 07/17/2008  . Hypertension 07/17/2008    History reviewed. No pertinent surgical history.     Home Medications    Prior to Admission medications   Medication Sig Start Date End Date Taking? Authorizing Provider  amoxicillin (AMOXIL) 875 MG tablet Take 1 tablet (875 mg total) by mouth 2 (two) times daily. 06/06/20  Yes Fransico Meadow, PA-C  Alcohol Swabs PADS 1 each by Does not apply route 3 (three) times  daily before meals. 11/13/18   Charlott Rakes, MD  amLODipine (NORVASC) 5 MG tablet TAKE 1 TABLET(5 MG) BY MOUTH DAILY 11/13/18   Charlott Rakes, MD  atorvastatin (LIPITOR) 40 MG tablet Take 1 tablet (40 mg total) by mouth daily. 11/13/18   Charlott Rakes, MD  B-D UF III MINI PEN NEEDLES 31G X 5 MM MISC USE TO INJECT AT BEDTIME 06/21/18   Charlott Rakes, MD  bictegravir-emtricitabine-tenofovir AF (BIKTARVY) 50-200-25 MG TABS tablet Take 1 tablet by mouth daily. 03/31/20   Michel Bickers, MD  Blood Glucose Monitoring Suppl [facility] VERIO) w/Device KIT USE AS DIRECTED 03/14/18   Charlott Rakes, MD  cetirizine (ZYRTEC) 10 MG tablet TAKE 1 TABLET(10 MG) BY MOUTH DAILY 11/13/18   Charlott Rakes, MD  diclofenac sodium (VOLTAREN) 1 % GEL Apply 2 g topically 4 (four) times daily. 09/08/18   Robyn Haber, MD  ezetimibe (ZETIA) 10 MG tablet 1 daily Patient not taking: No sig reported 11/13/18   Charlott Rakes, MD  fluticasone (FLONASE) 50 MCG/ACT nasal spray Place 2 sprays into both nostrils daily. 11/13/18   Charlott Rakes, MD  glipiZIDE (GLUCOTROL) 10 MG tablet TAKE 1 TABLET(10 MG) BY MOUTH TWICE DAILY BEFORE A MEAL 11/13/18   Charlott Rakes, MD  glucose blood (ONETOUCH VERIO) test strip Use as instructed 03/14/18   Charlott Rakes, MD  hydrocortisone (ANUSOL-HC) 2.5 % rectal cream Apply to rectal hemorrhoid BID PRN 08/20/18   Ladell Pier, MD  hydrocortisone cream 1 % APP AA QID FOR 7 DAYS 05/21/19   [provider]  hydrocortisone-pramoxine (ANALPRAM HC) 2.5-1 % rectal cream Place 1 application rectally 3 (three) times daily. 08/14/18   Charlott Rakes, MD  hydrOXYzine (ATARAX/VISTARIL) 25 MG tablet Take 1 tablet (25 mg total) by mouth every 6 (six) hours. 11/22/19   Marney Setting, NP  Insulin Glargine (LANTUS SOLOSTAR) 100 UNIT/ML Solostar Pen Inject 35 Units into the skin at bedtime. 03/29/19   Charlott Rakes, MD  Lancet Devices [facility]) lancets Use as instructed for  3 times daily testing of blood sugar. 12/07/15   Charlott Rakes, MD  LANTUS 100 UNIT/ML injection  08/14/19   [provider]  Jonetta Speak LANCETS 16X MISC USE AS DIRECTED 03/14/18   Charlott Rakes, MD  polyethylene glycol (MIRALAX) 17 g packet Take 17 g by mouth daily as needed for moderate constipation or severe constipation. 04/10/20   Scot Jun, FNP  predniSONE (STERAPRED UNI-PAK 21 TAB) 10 MG (21) TBPK tablet Take by mouth daily. Take 6 tabs by mouth daily  for 2 days, then 5 tabs for 2 days, then 4 tabs for 2 days, then 3 tabs for 2 days, 2 tabs for 2 days, then 1 tab by mouth daily for 2 days 11/22/19   Marney Setting, NP  sodium chloride (OCEAN) 0.65 % SOLN nasal spray Place 1 spray into both nostrils as needed for congestion. Patient not taking: No sig reported 02/07/20   Darr, Edison Nasuti, PA-C  valACYclovir (VALTREX) 1000 MG tablet TAKE 1 TABLET(1000 MG) BY MOUTH TWICE DAILY 10/01/18   Charlott Rakes, MD    Family History Family History  Problem Relation Age of Onset  . Hypertension Mother   . Heart failure Father     Social History Social History   Tobacco Use  . Smoking status: Never Smoker  . Smokeless tobacco: Never Used  Vaping Use  . Vaping Use: Never used  Substance Use Topics  . Alcohol use: Yes    Alcohol/week: 21.0 standard drinks    Types: 21 Cans of beer per week  . Drug use: No     Allergies   Shrimp [shellfish allergy] and Lisinopril   Review of Systems Review of Systems  Respiratory: Positive for cough.   All other systems reviewed and are negative.    Physical Exam Triage Vital Signs ED Triage Vitals  Enc Vitals Group     BP 06/06/20 1215 (!) 165/94     Pulse Rate 06/06/20 1215 73     Resp 06/06/20 1215 16     Temp 06/06/20 1215 98.3 F (36.8 C)     Temp Source 06/06/20 1215 Oral     SpO2 06/06/20 1215 98 %     Weight --      Height --      Head Circumference --      Peak Flow --      Pain Score 06/06/20 1211 0      Pain Loc --      Pain Edu? --      Excl. in [place]? --    No data found.  Updated Vital Signs BP (!) 165/94 (BP Location: Left Arm)   Pulse 73   Temp 98.3 F (36.8 C) (Oral)   Resp 16   SpO2 98%   Visual Acuity Right Eye Distance:   Left Eye Distance:   Bilateral Distance:    Right Eye Near:   Left Eye Near:  Bilateral Near:     Physical Exam Vitals and nursing note reviewed.  Constitutional:      Appearance: He is well-developed and well-nourished.  HENT:     Head: Normocephalic and atraumatic.     Comments: bilat sinus tenderness    Nose: Nose normal.     Mouth/Throat:     Mouth: Mucous membranes are moist.  Eyes:     Conjunctiva/sclera: Conjunctivae normal.  Cardiovascular:     Rate and Rhythm: Normal rate and regular rhythm.     Heart sounds: No murmur heard.   Pulmonary:     Effort: Pulmonary effort is normal. No respiratory distress.     Breath sounds: Normal breath sounds.  Abdominal:     Palpations: Abdomen is soft.     Tenderness: There is no abdominal tenderness.  Musculoskeletal:        General: No edema.     Cervical back: Neck supple.  Skin:    General: Skin is warm and dry.  Neurological:     General: No focal deficit present.     Mental Status: He is alert.  Psychiatric:        Mood and Affect: Mood and affect and mood normal.      UC Treatments / Results  Labs (all labs ordered are listed, but only abnormal results are displayed) Labs Reviewed  SARS CORONAVIRUS 2 (TAT 6-24 HRS)    EKG   Radiology No results found.  Procedures Procedures (including critical care time)  Medications Ordered in UC Medications - No data to display  Initial Impression / Assessment and Plan / UC Course  I have reviewed the triage vital signs and the nursing notes.  Pertinent labs & imaging results that were available during my care of the patient were reviewed by me and considered in my medical decision making (see chart for details).      MDM:  Covid pending.  Pt states he can not aford augmentin.  I will treat with amoxicillian  Final Clinical Impressions(s) / UC Diagnoses   Final diagnoses:  Acute non-recurrent maxillary sinusitis     Discharge Instructions     Covid is pending    ED Prescriptions    Medication Sig Dispense Auth. Provider   amoxicillin (AMOXIL) 875 MG tablet Take 1 tablet (875 mg total) by mouth 2 (two) times daily. 20 tablet Fransico Meadow, [place]     PDMP not reviewed this encounter.  An After Visit Summary was printed and given to the patient.    Fransico Meadow, [place] 06/06/20 1305

## 2020-06-06 NOTE — Discharge Instructions (Addendum)
Covid is pending  

## 2020-06-06 NOTE — ED Triage Notes (Signed)
Presents with sinus pain, burning, congestion, and dry cough, and dry throat xs 5 days ago. States using OTC nasal sprays and cold medications with relief.

## 2020-06-10 ENCOUNTER — Ambulatory Visit: Payer: Medicare Other

## 2020-07-13 ENCOUNTER — Ambulatory Visit: Payer: Medicare Other

## 2020-07-26 ENCOUNTER — Other Ambulatory Visit: Payer: Self-pay

## 2020-07-26 ENCOUNTER — Encounter (HOSPITAL_COMMUNITY): Payer: Self-pay

## 2020-07-26 ENCOUNTER — Ambulatory Visit (HOSPITAL_COMMUNITY)
Admission: EM | Admit: 2020-07-26 | Discharge: 2020-07-26 | Disposition: A | Discharge status: Home / Self Care | Payer: Medicare HMO | Attending: Emergency Medicine | Admitting: Emergency Medicine

## 2020-07-26 DIAGNOSIS — E118 Type 2 diabetes mellitus with unspecified complications: Secondary | ICD-10-CM | POA: Diagnosis not present

## 2020-07-26 DIAGNOSIS — M545 Low back pain, unspecified: Secondary | ICD-10-CM | POA: Insufficient documentation

## 2020-07-26 DIAGNOSIS — Z79899 Other long term (current) drug therapy: Secondary | ICD-10-CM | POA: Insufficient documentation

## 2020-07-26 DIAGNOSIS — B20 Human immunodeficiency virus [HIV] disease: Secondary | ICD-10-CM | POA: Insufficient documentation

## 2020-07-26 DIAGNOSIS — Z794 Long term (current) use of insulin: Secondary | ICD-10-CM | POA: Insufficient documentation

## 2020-07-26 DIAGNOSIS — Z20822 Contact with and (suspected) exposure to covid-19: Secondary | ICD-10-CM | POA: Insufficient documentation

## 2020-07-26 DIAGNOSIS — Z7901 Long term (current) use of anticoagulants: Secondary | ICD-10-CM | POA: Insufficient documentation

## 2020-07-26 DIAGNOSIS — J069 Acute upper respiratory infection, unspecified: Secondary | ICD-10-CM | POA: Insufficient documentation

## 2020-07-26 DIAGNOSIS — R519 Headache, unspecified: Secondary | ICD-10-CM | POA: Insufficient documentation

## 2020-07-26 DIAGNOSIS — I10 Essential (primary) hypertension: Secondary | ICD-10-CM | POA: Insufficient documentation

## 2020-07-26 DIAGNOSIS — Z791 Long term (current) use of non-steroidal anti-inflammatories (NSAID): Secondary | ICD-10-CM | POA: Diagnosis not present

## 2020-07-26 DIAGNOSIS — M1990 Unspecified osteoarthritis, unspecified site: Secondary | ICD-10-CM | POA: Diagnosis not present

## 2020-07-26 LAB — SARS CORONAVIRUS 2 (TAT 6-24 HRS): SARS Coronavirus 2: NEGATIVE

## 2020-07-26 MED ORDER — ALBUTEROL SULFATE HFA 108 (90 BASE) MCG/ACT IN AERS
INHALATION_SPRAY | RESPIRATORY_TRACT | Status: AC
  Filled 2020-07-26: qty 6.7

## 2020-07-26 MED ORDER — ALBUTEROL SULFATE HFA 108 (90 BASE) MCG/ACT IN AERS
2.0000 | INHALATION_SPRAY | Freq: Once | RESPIRATORY_TRACT | Status: AC
  Administered 2020-07-26: 2 via RESPIRATORY_TRACT

## 2020-07-26 NOTE — ED Triage Notes (Signed)
Pt presents with sinus pressure and headache x x 3 days. Denies fever, cough. OTC gives relief.    Pt is concern having bronchitis.

## 2020-07-26 NOTE — Discharge Instructions (Signed)
Use the albuterol inhaler as directed.  Follow-up with your primary care provider next week if your symptoms are not improving.    Your COVID test is pending.  You should self quarantine until the test result is back.

## 2020-07-26 NOTE — ED Provider Notes (Signed)
[place]    CSN: [id number] Arrival date & time: 07/26/20  1127      History   Chief Complaint Chief Complaint  Patient presents with   Sinus Problem    HPI Benjamin Hunter is a 60 y.o. male.   Patient presents with 3-day history of sinus congestion, sinus pressure, and headache.  He denies dizziness, fever, chills, sore throat, cough,, shortness of breath, vomiting, diarrhea, or other symptoms.  He has had to have nebulizer treatments in the past when he had similar symptoms which he says was bronchitis.  Treatment attempted at home with OTC sinus medication.  Patient was seen here on 06/06/2020; diagnosed with acute sinusitis; treated with his medical history includes diabetes, hypertension, HIV, arthritis, depression, recurrent low back pain.  The history is provided by the patient and medical records.    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 No pertinent surgical history.     Home Medications    Prior to Admission medications   Medication Sig Start Date End Date Taking? Authorizing Provider  Alcohol Swabs PADS 1 each by Does not apply route 3 (three) times daily before meals.  11/13/18   Charlott Rakes, MD  amLODipine (NORVASC) 5 MG tablet TAKE 1 TABLET(5 MG) BY MOUTH DAILY 11/13/18   Charlott Rakes, MD  amoxicillin (AMOXIL) 875 MG tablet Take 1 tablet (875 mg total) by mouth 2 (two) times daily. 06/06/20   Fransico Meadow, PA-C  atorvastatin (LIPITOR) 40 MG tablet Take 1 tablet (40 mg total) by mouth daily. 11/13/18   Charlott Rakes, MD  B-D UF III MINI PEN NEEDLES 31G X 5 MM MISC USE TO INJECT AT BEDTIME 06/21/18   Charlott Rakes, MD  bictegravir-emtricitabine-tenofovir AF (BIKTARVY) 50-200-25 MG TABS tablet Take 1 tablet by mouth daily. 03/31/20   Michel Bickers, MD  Blood Glucose Monitoring [facility] VERIO) w/Device KIT USE AS DIRECTED 03/14/18   Charlott Rakes, MD  cetirizine (ZYRTEC) 10 MG tablet TAKE 1 TABLET(10 MG) BY MOUTH DAILY 11/13/18   Charlott Rakes, MD  diclofenac sodium (VOLTAREN) 1 % GEL Apply 2 g topically 4 (four) times daily. 09/08/18   Robyn Haber, MD  ezetimibe (ZETIA) 10 MG tablet 1 daily Patient not taking: No sig reported 11/13/18   Charlott Rakes, MD  fluticasone (FLONASE) 50 MCG/ACT nasal spray Place 2 sprays into both nostrils daily. 11/13/18   Charlott Rakes, MD  glipiZIDE (GLUCOTROL) 10 MG tablet TAKE 1 TABLET(10 MG) BY MOUTH TWICE DAILY BEFORE A MEAL 11/13/18   Charlott Rakes, MD  glucose blood (ONETOUCH VERIO) test strip Use as instructed 03/14/18   Charlott Rakes, MD  hydrocortisone (ANUSOL-HC) 2.5 % rectal cream Apply to rectal hemorrhoid BID PRN  08/20/18   Ladell Pier, MD  hydrocortisone cream 1 % APP AA QID FOR 7 DAYS 05/21/19   [provider]  hydrocortisone-pramoxine (ANALPRAM HC) 2.5-1 % rectal cream Place 1 application rectally 3 (three) times daily. 08/14/18   Charlott Rakes, MD  hydrOXYzine (ATARAX/VISTARIL) 25 MG tablet Take 1 tablet (25 mg total) by mouth every 6 (six) hours. 11/22/19   Marney Setting, NP  Insulin Glargine (LANTUS SOLOSTAR) 100 UNIT/ML Solostar Pen Inject 35 Units into the skin at  bedtime. 03/29/19   Charlott Rakes, MD  Lancet Devices [facility]) lancets Use as instructed for 3 times daily testing of blood sugar. 12/07/15   Charlott Rakes, MD  LANTUS 100 UNIT/ML injection  08/14/19   [provider]  Jonetta Speak LANCETS 87G MISC USE AS DIRECTED 03/14/18   Charlott Rakes, MD  polyethylene glycol (MIRALAX) 17 g packet Take 17 g by mouth daily as needed for moderate constipation or severe constipation. 04/10/20   Scot Jun, FNP  predniSONE (STERAPRED UNI-PAK 21 TAB) 10 MG (21) TBPK tablet Take by mouth daily. Take 6 tabs by mouth daily  for 2 days, then 5 tabs for 2 days, then 4 tabs for 2 days, then 3 tabs for 2 days, 2 tabs for 2 days, then 1 tab by mouth daily for 2 days 11/22/19   Marney Setting, NP  sodium chloride (OCEAN) 0.65 % SOLN nasal spray Place 1 spray into both nostrils as needed for congestion. Patient not taking: No sig reported 02/07/20   Darr, Edison Nasuti, PA-C  valACYclovir (VALTREX) 1000 MG tablet TAKE 1 TABLET(1000 MG) BY MOUTH TWICE DAILY 10/01/18   Charlott Rakes, MD    Family History Family History  Problem Relation Age of Onset   Hypertension Mother    Heart failure Father     Social History Social History   Tobacco Use   Smoking status: Never Smoker   Smokeless tobacco: Never Used  Scientific laboratory technician Use: Never used  Substance Use Topics   Alcohol use: Yes    Alcohol/week: 21.0 standard drinks    Types: 21 Cans of beer per week   Drug use: No     Allergies   Shrimp [shellfish allergy] and Lisinopril   Review of Systems Review of Systems  Constitutional: Negative for chills and fever.  HENT: Positive for congestion and sinus pressure. Negative for ear pain and sore throat.   Eyes: Negative for pain and visual disturbance.  Respiratory: Negative for cough and shortness of breath.   Cardiovascular: Negative for chest pain and palpitations.  Gastrointestinal: Negative for abdominal pain and  vomiting.  Genitourinary: Negative for dysuria and hematuria.  Musculoskeletal: Negative for arthralgias and back pain.  Skin: Negative for color change and rash.  Neurological: Positive for headaches. Negative for dizziness, syncope, weakness and numbness.  All other systems reviewed and are negative.    Physical Exam Triage Vital Signs ED Triage Vitals  Enc Vitals Group     BP      Pulse      Resp      Temp      Temp src      SpO2      Weight      Height      Head Circumference      Peak Flow      Pain Score      Pain Loc      Pain Edu?      Excl.  in [place]?    No data found.  Updated Vital Signs BP 138/68 (BP Location: Right Arm)    Pulse 73    Temp 98 F (36.7 C) (Oral)    Resp 18    SpO2 100%   Visual Acuity Right Eye Distance:   Left Eye Distance:   Bilateral Distance:    Right Eye Near:   Left Eye Near:    Bilateral Near:     Physical Exam Vitals and nursing note reviewed.  Constitutional:      General: He is not in acute distress.    Appearance: He is well-developed and well-nourished. He is not ill-appearing.  HENT:     Head: Normocephalic and atraumatic.     Right Ear: Tympanic membrane normal.     Left Ear: Tympanic membrane normal.     Nose: Nose normal.     Mouth/Throat:     Mouth: Mucous membranes are moist.     Pharynx: Oropharynx is clear.  Eyes:     Conjunctiva/sclera: Conjunctivae normal.  Cardiovascular:     Rate and Rhythm: Normal rate and regular rhythm.     Heart sounds: Normal heart sounds.  Pulmonary:     Effort: Pulmonary effort is normal. No respiratory distress.     Breath sounds: Normal breath sounds. No wheezing.  Abdominal:     Palpations: Abdomen is soft.     Tenderness: There is no abdominal tenderness.  Musculoskeletal:        General: No edema.     Cervical back: Neck supple.  Skin:    General: Skin is warm and dry.  Neurological:     General: No focal deficit present.     Mental Status: He is alert and oriented  to person, place, and time.     Gait: Gait normal.  Psychiatric:        Mood and Affect: Mood and affect and mood normal.        Behavior: Behavior normal.      UC Treatments / Results  Labs (all labs ordered are listed, but only abnormal results are displayed) Labs Reviewed  SARS CORONAVIRUS 2 (TAT 6-24 HRS)    EKG   Radiology No results found.  Procedures Procedures (including critical care time)  Medications Ordered in UC Medications  albuterol (VENTOLIN HFA) 108 (90 Base) MCG/ACT inhaler 2 puff (has no administration in time range)    Initial Impression / Assessment and Plan / UC Course  I have reviewed the triage vital signs and the nursing notes.  Pertinent labs & imaging results that were available during my care of the patient were reviewed by me and considered in my medical decision making (see chart for details).   Viral URI.  Due to patient's history of needing nebulizer treatments, albuterol inhaler provided today.  Instructed patient to follow-up with his PCP next week if his symptoms are not improving.  COVID pending.  Instructed patient to self quarantine until the test results are back.  Discussed other symptomatic treatment including Tylenol, plain Mucinex, rest, hydration. Patient agrees to plan of care.    Final Clinical Impressions(s) / UC Diagnoses   Final diagnoses:  Viral URI     Discharge Instructions     Use the albuterol inhaler as directed.  Follow-up with your primary care provider next week if your symptoms are not improving.    Your COVID test is pending.  You should self quarantine until the test result is back.  ED Prescriptions    None     PDMP not reviewed this encounter.   Sharion Balloon, NP 07/26/20 1247

## 2020-08-19 ENCOUNTER — Encounter (HOSPITAL_COMMUNITY): Payer: Self-pay

## 2020-08-19 ENCOUNTER — Ambulatory Visit (INDEPENDENT_AMBULATORY_CARE_PROVIDER_SITE_OTHER): Payer: Medicare HMO

## 2020-08-19 ENCOUNTER — Other Ambulatory Visit: Payer: Self-pay

## 2020-08-19 ENCOUNTER — Ambulatory Visit (HOSPITAL_COMMUNITY)
Admission: EM | Admit: 2020-08-19 | Discharge: 2020-08-19 | Disposition: A | Discharge status: Home / Self Care | Payer: Medicare HMO | Attending: Urgent Care | Admitting: Urgent Care

## 2020-08-19 DIAGNOSIS — R0981 Nasal congestion: Secondary | ICD-10-CM | POA: Diagnosis not present

## 2020-08-19 DIAGNOSIS — R070 Pain in throat: Secondary | ICD-10-CM | POA: Diagnosis not present

## 2020-08-19 DIAGNOSIS — J3089 Other allergic rhinitis: Secondary | ICD-10-CM

## 2020-08-19 DIAGNOSIS — B20 Human immunodeficiency virus [HIV] disease: Secondary | ICD-10-CM

## 2020-08-19 DIAGNOSIS — R059 Cough, unspecified: Secondary | ICD-10-CM

## 2020-08-19 DIAGNOSIS — E119 Type 2 diabetes mellitus without complications: Secondary | ICD-10-CM

## 2020-08-19 LAB — CBG MONITORING, ED: Glucose-Capillary: 136 mg/dL — ABNORMAL HIGH (ref 70–99)

## 2020-08-19 MED ORDER — METHYLPREDNISOLONE ACETATE 40 MG/ML IJ SUSP
INTRAMUSCULAR | Status: AC
  Filled 2020-08-19: qty 1

## 2020-08-19 MED ORDER — METHYLPREDNISOLONE ACETATE 80 MG/ML IJ SUSP
40.0000 mg | Freq: Once | INTRAMUSCULAR | Status: AC
  Administered 2020-08-19: 40 mg via INTRAMUSCULAR

## 2020-08-19 MED ORDER — CETIRIZINE HCL 10 MG PO TABS: 10.0000 mg | ORAL_TABLET | Freq: Every day | ORAL | 0 refills | Status: AC

## 2020-08-19 MED ORDER — METHYLPREDNISOLONE ACETATE 80 MG/ML IJ SUSP
INTRAMUSCULAR | Status: AC
  Filled 2020-08-19: qty 1

## 2020-08-19 MED ORDER — MONTELUKAST SODIUM 10 MG PO TABS: 10.0000 mg | ORAL_TABLET | Freq: Every day | ORAL | 0 refills | Status: DC

## 2020-08-19 NOTE — ED Provider Notes (Addendum)
[place]   MRN: [id number] DOB: 03-03-1961  Subjective:   Benjamin Hunter is a 60 y.o. male presenting for 4-day history of acute onset recurrent sinus congestion and pressure, scratchy throat, productive cough.  Has had similar symptoms that in January and February.  Both months he tested negative for COVID-19.  Denies any particular chest pain, shortness of breath.  Patient is not a smoker.  He does have uncontrolled diabetes and HIV.  Reports that he is trying to do better with his diet for his blood sugar.  He reports that he has previously received an injection in our clinic that helped in the past with his symptoms.  Would like to be considered for this again.  No current facility-administered medications for this encounter.  Current Outpatient Medications:  .  Alcohol Swabs PADS, 1 each by Does not apply route 3 (three) times daily before meals., Disp: 90 each, Rfl: 11 .  amLODipine (NORVASC) 5 MG tablet, TAKE 1 TABLET(5 MG) BY MOUTH DAILY, Disp: 90 tablet, Rfl: 1 .  amoxicillin (AMOXIL) 875 MG tablet, Take 1 tablet (875 mg total) by mouth 2 (two) times daily., Disp: 20 tablet, Rfl: 0 .  atorvastatin (LIPITOR) 40 MG tablet, Take 1 tablet (40 mg total) by mouth daily., Disp: 90 tablet, Rfl: 1 .  B-D UF III MINI PEN NEEDLES 31G X 5 MM MISC, USE TO INJECT AT BEDTIME, Disp: 100 each, Rfl: 2 .  bictegravir-emtricitabine-tenofovir AF (BIKTARVY) 50-200-25 MG TABS tablet, Take 1 tablet by mouth daily., Disp: 30 tablet, Rfl: 11 .  Blood Glucose Monitoring Suppl (ONETOUCH VERIO) w/Device KIT, USE AS DIRECTED, Disp: 1 kit, Rfl: 0 .  cetirizine (ZYRTEC) 10 MG tablet, TAKE 1 TABLET(10 MG) BY MOUTH DAILY, Disp: 90 tablet, Rfl: 1 .  diclofenac sodium (VOLTAREN) 1 % GEL, Apply 2 g topically 4 (four) times daily., Disp: 100 g, Rfl: 2 .  ezetimibe (ZETIA) 10 MG tablet, 1 daily (Patient not taking: No sig reported), Disp: 90 tablet, Rfl: 1 .  fluticasone (FLONASE) 50 MCG/ACT nasal  spray, Place 2 sprays into both nostrils daily., Disp: 16 g, Rfl: 3 .  glipiZIDE (GLUCOTROL) 10 MG tablet, TAKE 1 TABLET(10 MG) BY MOUTH TWICE DAILY BEFORE A MEAL, Disp: 180 tablet, Rfl: 1 .  glucose blood (ONETOUCH VERIO) test strip, Use as instructed, Disp: 100 each, Rfl: 12 .  hydrocortisone (ANUSOL-HC) 2.5 % rectal cream, Apply to rectal hemorrhoid BID PRN, Disp: 30 g, Rfl: 0 .  hydrocortisone cream 1 %, APP AA QID FOR 7 DAYS, Disp: , Rfl:  .  hydrocortisone-pramoxine (ANALPRAM HC) 2.5-1 % rectal cream, Place 1 application rectally 3 (three) times daily., Disp: 30 g, Rfl: 1 .  hydrOXYzine (ATARAX/VISTARIL) 25 MG tablet, Take 1 tablet (25 mg total) by mouth every 6 (six) hours., Disp: 12 tablet, Rfl: 0 .  Insulin Glargine (LANTUS SOLOSTAR) 100 UNIT/ML Solostar Pen, Inject 35 Units into the skin at bedtime., Disp: 15 mL, Rfl: 1 .  Lancet Devices (ACCU-CHEK SOFTCLIX) lancets, Use as instructed for 3 times daily testing of blood sugar., Disp: 1 each, Rfl: 0 .  LANTUS 100 UNIT/ML injection, , Disp: , Rfl:  .  ONETOUCH DELICA LANCETS 84T MISC, USE AS DIRECTED, Disp: 100 each, Rfl: 11 .  polyethylene glycol (MIRALAX) 17 g packet, Take 17 g by mouth daily as needed for moderate constipation or severe constipation., Disp: 14 each, Rfl: 0 .  predniSONE (STERAPRED UNI-PAK 21 TAB) 10 MG (21) TBPK tablet, Take  by mouth daily. Take 6 tabs by mouth daily  for 2 days, then 5 tabs for 2 days, then 4 tabs for 2 days, then 3 tabs for 2 days, 2 tabs for 2 days, then 1 tab by mouth daily for 2 days, Disp: 42 tablet, Rfl: 0 .  sodium chloride (OCEAN) 0.65 % SOLN nasal spray, Place 1 spray into both nostrils as needed for congestion. (Patient not taking: No sig reported), Disp: 88 mL, Rfl: 0 .  valACYclovir (VALTREX) 1000 MG tablet, TAKE 1 TABLET(1000 MG) BY MOUTH TWICE DAILY, Disp: 20 tablet, Rfl: 0   Allergies  Allergen Reactions  . Shrimp [Shellfish Allergy] Nausea And Vomiting  . Lisinopril Rash    Palmar  rash     Past Medical History:  Diagnosis Date  . Arthritis   . Depression   . Diabetes mellitus   . HIV (human immunodeficiency virus infection) ([place])   . Hyperlipidemia   . Hypertension      History reviewed. No pertinent surgical history.  Family History  Problem Relation Age of Onset  . Hypertension Mother   . Heart failure Father     Social History   Tobacco Use  . Smoking status: Never Smoker  . Smokeless tobacco: Never Used  Vaping Use  . Vaping Use: Never used  Substance Use Topics  . Alcohol use: Yes    Alcohol/week: 21.0 standard drinks    Types: 21 Cans of beer per week  . Drug use: No    ROS   Objective:   Vitals: BP 127/85 (BP Location: Right Arm)   Pulse 68   Temp 98 F (36.7 C) (Oral)   Resp 18   SpO2 98%   Physical Exam Constitutional:      General: He is not in acute distress.    Appearance: Normal appearance. He is well-developed. He is not ill-appearing, toxic-appearing or diaphoretic.  HENT:     Head: Normocephalic and atraumatic.     Right Ear: External ear normal.     Left Ear: External ear normal.     Nose: Congestion present. No rhinorrhea.     Mouth/Throat:     Mouth: Mucous membranes are moist.     Pharynx: No oropharyngeal exudate or posterior oropharyngeal erythema.     Comments: Mild amount of post-nasal drainage overlying pharynx.  Eyes:     General: No scleral icterus.       Right eye: No discharge.        Left eye: No discharge.     Extraocular Movements: Extraocular movements intact.     Conjunctiva/sclera: Conjunctivae normal.     Pupils: Pupils are equal, round, and reactive to light.  Cardiovascular:     Rate and Rhythm: Normal rate and regular rhythm.     Heart sounds: Normal heart sounds. No murmur heard. No friction rub. No gallop.   Pulmonary:     Effort: Pulmonary effort is normal. No respiratory distress.     Breath sounds: Normal breath sounds. No stridor. No wheezing, rhonchi or rales.   Neurological:     Mental Status: He is alert and oriented to person, place, and time.  Psychiatric:        Mood and Affect: Mood normal.        Behavior: Behavior normal.        Thought Content: Thought content normal.        Judgment: Judgment normal.     Results for orders placed or performed during the hospital  encounter of 08/19/20 (from the past 24 hour(s))  POC CBG monitoring     Status: Abnormal   Collection Time: 08/19/20  1:39 PM  Result Value Ref Range   Glucose-Capillary 136 (H) 70 - 99 mg/dL    Assessment and Plan :   PDMP not reviewed this encounter.  1. Allergic rhinitis due to other allergic trigger, unspecified seasonality   2. Sinus congestion   3. Throat discomfort   4. Cough   5. HIV disease ([place])   6. Well controlled diabetes mellitus ([place])     Over-read is pending. Will use IM Depomedrol at 67m for suspected allergic rhinitis. Emphasized need for consultation with allergy center as using steroids regularly is not viable for his diabetes. Will defer COVID 19 testing. Start Zyrtec and Singulair daily. Counseled patient on potential for adverse effects with medications prescribed/recommended today, ER and return-to-clinic precautions discussed, patient verbalized understanding.    MJaynee Eagles PVerm[place]03/23/22 1355   DG Chest 2 View  Result Date: 08/19/2020 CLINICAL DATA:  Cough EXAM: CHEST - 2 VIEW COMPARISON:  08/30/2019 FINDINGS: The heart size and mediastinal contours are within normal limits. No focal airspace consolidation, pleural effusion, or pneumothorax. The visualized skeletal structures are unremarkable. IMPRESSION: No active cardiopulmonary disease. Electronically Signed   By: NDavina PokeD.O.   On: 08/19/2020 14:00      MJaynee Eagles PA-C 08/19/20 1404

## 2020-08-19 NOTE — ED Triage Notes (Signed)
Pt c/o scratchy throat and sinus pressure x 4 days. Pt states he has used nasal spray during the night and day as needed. Pt states it has helped a little bit. Pt states he wants to make sure he does not have bronchitis.

## 2020-09-20 ENCOUNTER — Encounter (HOSPITAL_COMMUNITY): Payer: Self-pay | Admitting: Emergency Medicine

## 2020-09-20 ENCOUNTER — Ambulatory Visit (HOSPITAL_COMMUNITY)
Admission: EM | Admit: 2020-09-20 | Discharge: 2020-09-20 | Disposition: A | Discharge status: Home / Self Care | Payer: Medicare HMO | Attending: Family Medicine | Admitting: Family Medicine

## 2020-09-20 ENCOUNTER — Ambulatory Visit (INDEPENDENT_AMBULATORY_CARE_PROVIDER_SITE_OTHER): Payer: Medicare HMO

## 2020-09-20 ENCOUNTER — Other Ambulatory Visit: Payer: Self-pay

## 2020-09-20 DIAGNOSIS — M7989 Other specified soft tissue disorders: Secondary | ICD-10-CM

## 2020-09-20 DIAGNOSIS — S97112A Crushing injury of left great toe, initial encounter: Secondary | ICD-10-CM

## 2020-09-20 DIAGNOSIS — M79675 Pain in left toe(s): Secondary | ICD-10-CM

## 2020-09-20 DIAGNOSIS — S40811A Abrasion of right upper arm, initial encounter: Secondary | ICD-10-CM | POA: Diagnosis not present

## 2020-09-20 DIAGNOSIS — X58XXXA Exposure to other specified factors, initial encounter: Secondary | ICD-10-CM | POA: Diagnosis not present

## 2020-09-20 DIAGNOSIS — H02846 Edema of left eye, unspecified eyelid: Secondary | ICD-10-CM

## 2020-09-20 MED ORDER — POLYMYXIN B-TRIMETHOPRIM 10000-0.1 UNIT/ML-% OP SOLN: 1.0000 [drp] | OPHTHALMIC | 0 refills | Status: DC

## 2020-09-20 MED ORDER — TRIAMCINOLONE ACETONIDE 0.1 % EX CREA: 1.0000 "application " | TOPICAL_CREAM | Freq: Two times a day (BID) | CUTANEOUS | 0 refills | Status: DC

## 2020-09-20 NOTE — ED Provider Notes (Signed)
[name]    CSN: [id number] Arrival date & time: 09/20/20  1108      History   Chief Complaint Chief Complaint  Patient presents with  . Toe Injury    HPI Benjamin Hunter is a 60 y.o. male.   Patient presenting today with pain, swelling, bruising of left great toe after a truck lift slowly came down on his toe.  He is able to ambulate and move the toe in all directions, but very sore.  No nail injury, open wounds, numbness, tingling, weakness.  So far not trying anything over-the-counter for symptoms.  He states he walked here today from his house and has been able to tolerate the pain well.  He is also concerned about some swelling in his left upper eyelid the past few days.  Has been trying some over-the-counter eye wipes without much relief.  Does have a history of similar bumps coming up.  No drainage from the area, globe of eye issues, visual changes, headaches.  Also has a large area of either abrasion or rash to right inner arm that started yesterday while he was working.  He states he scraped the arm up against something while working with some plants, concerned about possible poison oak reaction.  Has not been itchy, mildly sore.  Putting calamine on it so far with mild relief.    Past Medical History:  Diagnosis Date  . Arthritis   . Depression   . Diabetes mellitus   . HIV (human immunodeficiency virus infection) ([place])   . Hyperlipidemia   . Hypertension     Patient Active Problem List   Diagnosis Date Noted  . History of COVID-19 01/22/2020  . Right leg pain 05/15/2019  . 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 Depression 01/14/2013  . Rash and nonspecific skin eruption 10/11/2012  . Recurrent low back pain 10/11/2012  . Left facial numbness 08/20/2012  . Seasonal allergies 09/20/2011  . Polyarthritis 09/06/2011  . Type 2 diabetes mellitus with hyperglycemia, with long-term current use of insulin ([place]) 03/16/2009  . Human  immunodeficiency virus (HIV) disease ([place]) 07/17/2008  . Hyperlipidemia 07/17/2008  . Hypertension 07/17/2008    History reviewed. No pertinent surgical history.     Home Medications    Prior to Admission medications   Medication Sig Start Date End Date Taking? Authorizing Provider  Alcohol Swabs PADS 1 each by Does not apply route 3 (three) times daily before meals. 11/13/18  Yes Newlin, Enobong, MD  amLODipine (NORVASC) 5 MG tablet TAKE 1 TABLET(5 MG) BY MOUTH DAILY 11/13/18  Yes Newlin, Enobong, MD  atorvastatin (LIPITOR) 40 MG tablet Take 1 tablet (40 mg total) by mouth daily. 11/13/18  Yes Newlin, Charlane Ferretti, MD  B-D UF III MINI PEN NEEDLES 31G X 5 MM MISC USE TO INJECT AT BEDTIME 06/21/18  Yes Newlin, Enobong, MD  bictegravir-emtricitabine-tenofovir AF (BIKTARVY) 50-200-25 MG TABS tablet Take 1 tablet by mouth daily. 03/31/20  Yes Michel Bickers, MD  Blood Glucose Monitoring Suppl [facility] VERIO) w/Device KIT USE AS DIRECTED 03/14/18  Yes Charlott Rakes, MD  cetirizine (ZYRTEC ALLERGY) 10 MG tablet Take 1 tablet (10 mg total) by mouth daily. 08/19/20  Yes Jaynee Eagles, PA-C  fluticasone [facility]) 50 MCG/ACT nasal spray Place 2 sprays into both nostrils daily. 11/13/18  Yes Newlin, Enobong, MD  glipiZIDE (GLUCOTROL) 10 MG tablet TAKE 1 TABLET(10 MG) BY MOUTH TWICE DAILY BEFORE A MEAL 11/13/18  Yes Charlott Rakes, MD  glucose blood (  ONETOUCH VERIO) test strip Use as instructed 03/14/18  Yes Newlin, Charlane Ferretti, MD  hydrocortisone (ANUSOL-HC) 2.5 % rectal cream Apply to rectal hemorrhoid BID PRN 08/20/18  Yes Ladell Pier, MD  hydrocortisone cream 1 % APP AA QID FOR 7 DAYS 05/21/19  Yes [provider]  hydrocortisone-pramoxine (ANALPRAM HC) 2.5-1 % rectal cream Place 1 application rectally 3 (three) times daily. 08/14/18  Yes Charlott Rakes, MD  Insulin Glargine (LANTUS SOLOSTAR) 100 UNIT/ML Solostar Pen Inject 35 Units into the skin at bedtime. 03/29/19  Yes Charlott Rakes, MD   Lancet Devices [facility]) lancets Use as instructed for 3 times daily testing of blood sugar. 12/07/15  Yes Charlott Rakes, MD  LANTUS 100 UNIT/ML injection  08/14/19  Yes [provider]  Jonetta Speak LANCETS 25O MISC USE AS DIRECTED 03/14/18  Yes Charlott Rakes, MD  triamcinolone cream (KENALOG) 0.1 % Apply 1 application topically 2 (two) times daily. 09/20/20  Yes Volney American, PA-C  trimethoprim-polymyxin b (POLYTRIM) ophthalmic solution Place 1 drop into the left eye every 4 (four) hours. 09/20/20  Yes Volney American, PA-C  valACYclovir (VALTREX) 1000 MG tablet TAKE 1 TABLET(1000 MG) BY MOUTH TWICE DAILY 10/01/18  Yes Charlott Rakes, MD  amoxicillin (AMOXIL) 875 MG tablet Take 1 tablet (875 mg total) by mouth 2 (two) times daily. 06/06/20   Fransico Meadow, PA-C  diclofenac sodium (VOLTAREN) 1 % GEL Apply 2 g topically 4 (four) times daily. 09/08/18   Robyn Haber, MD  ezetimibe (ZETIA) 10 MG tablet 1 daily Patient not taking: No sig reported 11/13/18   Charlott Rakes, MD  hydrOXYzine (ATARAX/VISTARIL) 25 MG tablet Take 1 tablet (25 mg total) by mouth every 6 (six) hours. 11/22/19   Marney Setting, NP  montelukast (SINGULAIR) 10 MG tablet Take 1 tablet (10 mg total) by mouth at bedtime. 08/19/20   Jaynee Eagles, PA-C  polyethylene glycol (MIRALAX) 17 g packet Take 17 g by mouth daily as needed for moderate constipation or severe constipation. 04/10/20   Scot Jun, FNP  predniSONE (STERAPRED UNI-PAK 21 TAB) 10 MG (21) TBPK tablet Take by mouth daily. Take 6 tabs by mouth daily  for 2 days, then 5 tabs for 2 days, then 4 tabs for 2 days, then 3 tabs for 2 days, 2 tabs for 2 days, then 1 tab by mouth daily for 2 days 11/22/19   Marney Setting, NP  sodium chloride (OCEAN) 0.65 % SOLN nasal spray Place 1 spray into both nostrils as needed for congestion. Patient not taking: No sig reported 02/07/20   Darr, Edison Nasuti, PA-C    Family History Family  History  Problem Relation Age of Onset  . Hypertension Mother   . Heart failure Father     Social History Social History   Tobacco Use  . Smoking status: Never Smoker  . Smokeless tobacco: Never Used  Vaping Use  . Vaping Use: Never used  Substance Use Topics  . Alcohol use: Yes    Alcohol/week: 21.0 standard drinks    Types: 21 Cans of beer per week  . Drug use: No     Allergies   Shrimp [shellfish allergy] and Lisinopril   Review of Systems Review of Systems Per HPI  Physical Exam Triage Vital Signs ED Triage Vitals  Enc Vitals Group     BP 09/20/20 1143 120/65     Pulse Rate 09/20/20 1143 85     Resp --      Temp 09/20/20  1143 98.5 F (36.9 C)     Temp Source 09/20/20 1143 Oral     SpO2 09/20/20 1143 99 %     Weight --      Height --      Head Circumference --      Peak Flow --      Pain Score 09/20/20 1144 4     Pain Loc --      Pain Edu? --      Excl. in [place]? --    No data found.  Updated Vital Signs BP 120/65 (BP Location: Left Arm)   Pulse 85   Temp 98.5 F (36.9 C) (Oral)   SpO2 99%   Visual Acuity Right Eye Distance:   Left Eye Distance:   Bilateral Distance:    Right Eye Near:   Left Eye Near:    Bilateral Near:     Physical Exam Vitals and nursing note reviewed.  Constitutional:      Appearance: Normal appearance.  HENT:     Head: Atraumatic.  Eyes:     Extraocular Movements: Extraocular movements intact.     Conjunctiva/sclera: Conjunctivae normal.     Comments: Mild swelling and erythema of left upper eyelid, minimally tender to palpation.  No active drainage.  Conjunctive a benign  Cardiovascular:     Rate and Rhythm: Normal rate and regular rhythm.  Pulmonary:     Effort: Pulmonary effort is normal.     Breath sounds: Normal breath sounds.  Musculoskeletal:        General: Swelling, tenderness and signs of injury present. No deformity. Normal range of motion.     Cervical back: Normal range of motion and neck supple.      Comments: Dorsal surface of left great toe with bruising, trace edema, tenderness to palpation Range of motion fully intact left great toe and throughout foot.  Nail fully intact.  Skin:    General: Skin is warm and dry.     Findings: Bruising present.     Comments: Area of skin irritation right inner upper arm, no raised areas present, not itchy or draining  Neurological:     General: No focal deficit present.     Mental Status: He is oriented to person, place, and time.     Comments: Left foot neurovascularly intact  Psychiatric:        Mood and Affect: Mood normal.        Thought Content: Thought content normal.        Judgment: Judgment normal.      UC Treatments / Results  Labs (all labs ordered are listed, but only abnormal results are displayed) Labs Reviewed - No data to display  EKG   Radiology DG Toe Great Left  Result Date: 09/20/2020 CLINICAL DATA:  CRUSH INJURY TO GREAT TOE. GREAT TOE PAIN, SWELLING, AND BRUISING. Initial encounter. EXAM: LEFT GREAT TOE- 3-VIEW COMPARISON:  None. FINDINGS: There is no evidence of fracture or dislocation. There is no evidence of arthropathy or other focal bone abnormality. Soft tissues are unremarkable. IMPRESSION: Negative. Electronically Signed   By: Marlaine Hind M.D.   On: 09/20/2020 12:57    Procedures Procedures (including critical care time)  Medications Ordered in UC Medications - No data to display  Initial Impression / Assessment and Plan / UC Course  I have reviewed the triage vital signs and the nursing notes.  Pertinent labs & imaging results that were available during my care of the patient were reviewed  by me and considered in my medical decision making (see chart for details).     X-ray left great toe negative for bony injury, suspect contusion from weight of lift coming down on toe.  Discussed RICE protocol, over-the-counter pain relievers, close monitoring.  Polytrim drops given for possible developing  stye in addition to warm compresses.  We will send triamcinolone in case rash starts appearing in area of abrasion right arm.  Keep clean and dry. Final Clinical Impressions(s) / UC Diagnoses   Final diagnoses:  Toe pain, left  Abrasion of right upper extremity, initial encounter  Swelling of left eyelid   Discharge Instructions   None    ED Prescriptions    Medication Sig Dispense Auth. Provider   trimethoprim-polymyxin b (POLYTRIM) ophthalmic solution Place 1 drop into the left eye every 4 (four) hours. 10 mL Volney American, PA-C   triamcinolone cream (KENALOG) 0.1 % Apply 1 application topically 2 (two) times daily. 30 g Volney American, [place]     PDMP not reviewed this encounter.   Volney American, [place] 09/20/20 1313

## 2020-09-20 NOTE — ED Triage Notes (Signed)
Patient c/o left big toe injury last night.  Toe was caught under tail lift coming down on the truck.  Patient has taken Tylenol for pain.  Patient c/o swelling in left eyelid for several days.    Patient c/o possible poison ivy on left arm x 1 day.

## 2020-09-24 IMAGING — DX DG RIBS W/ CHEST 3+V*R*
3 series · 3 of 3 positions shown · non-contrast
Comparison: Chest x-ray 01/12/2017

CLINICAL DATA: Posttraumatic right rib pain

EXAM:
RIGHT RIBS AND CHEST - 3+ VIEW

[chest pa]
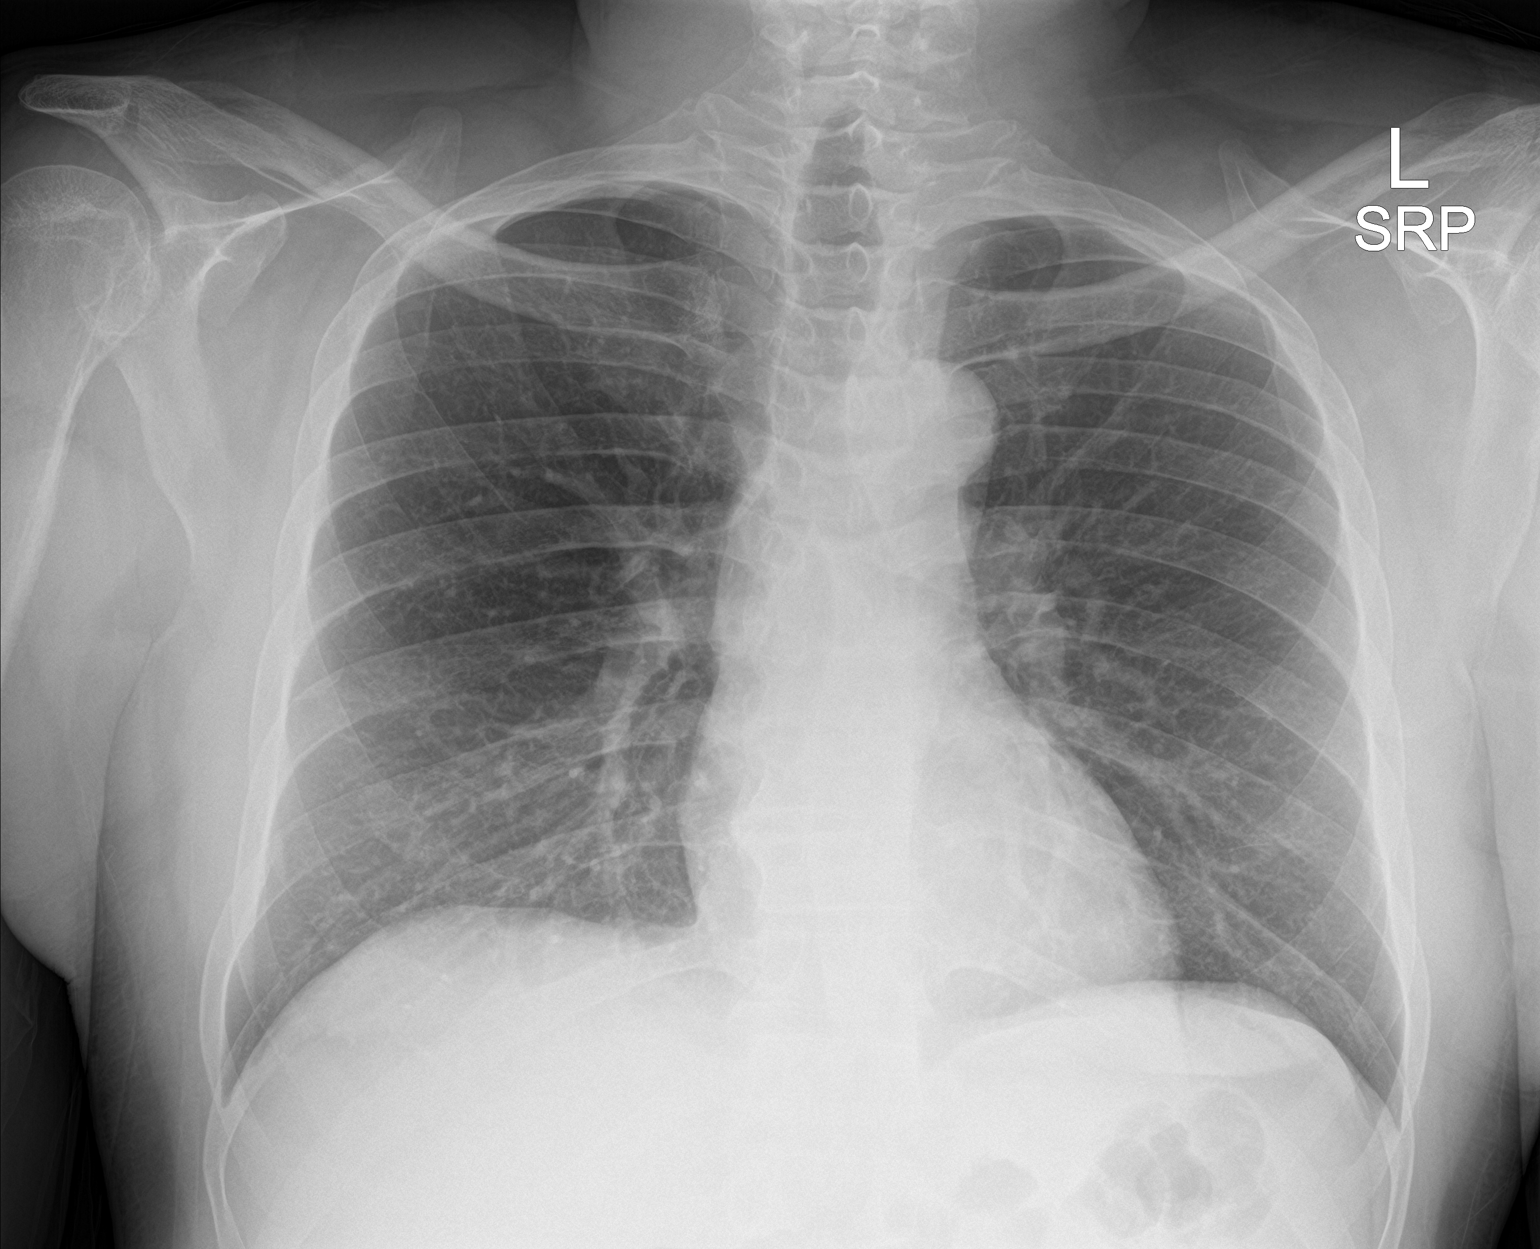

[rib pa]
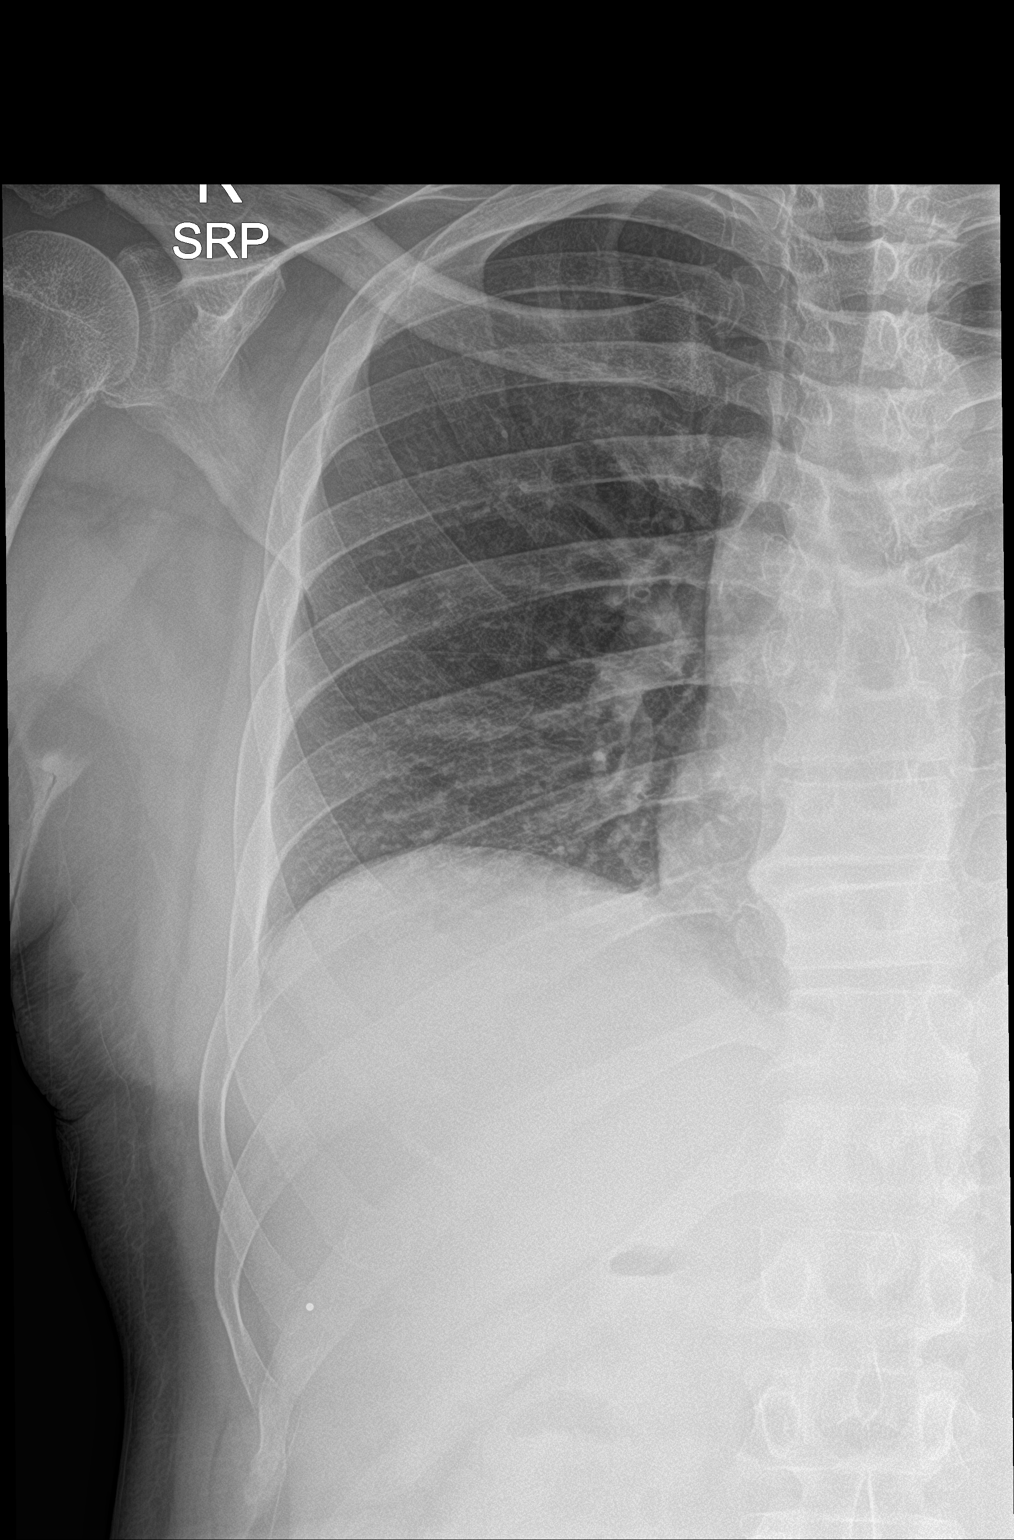

[rib obl]
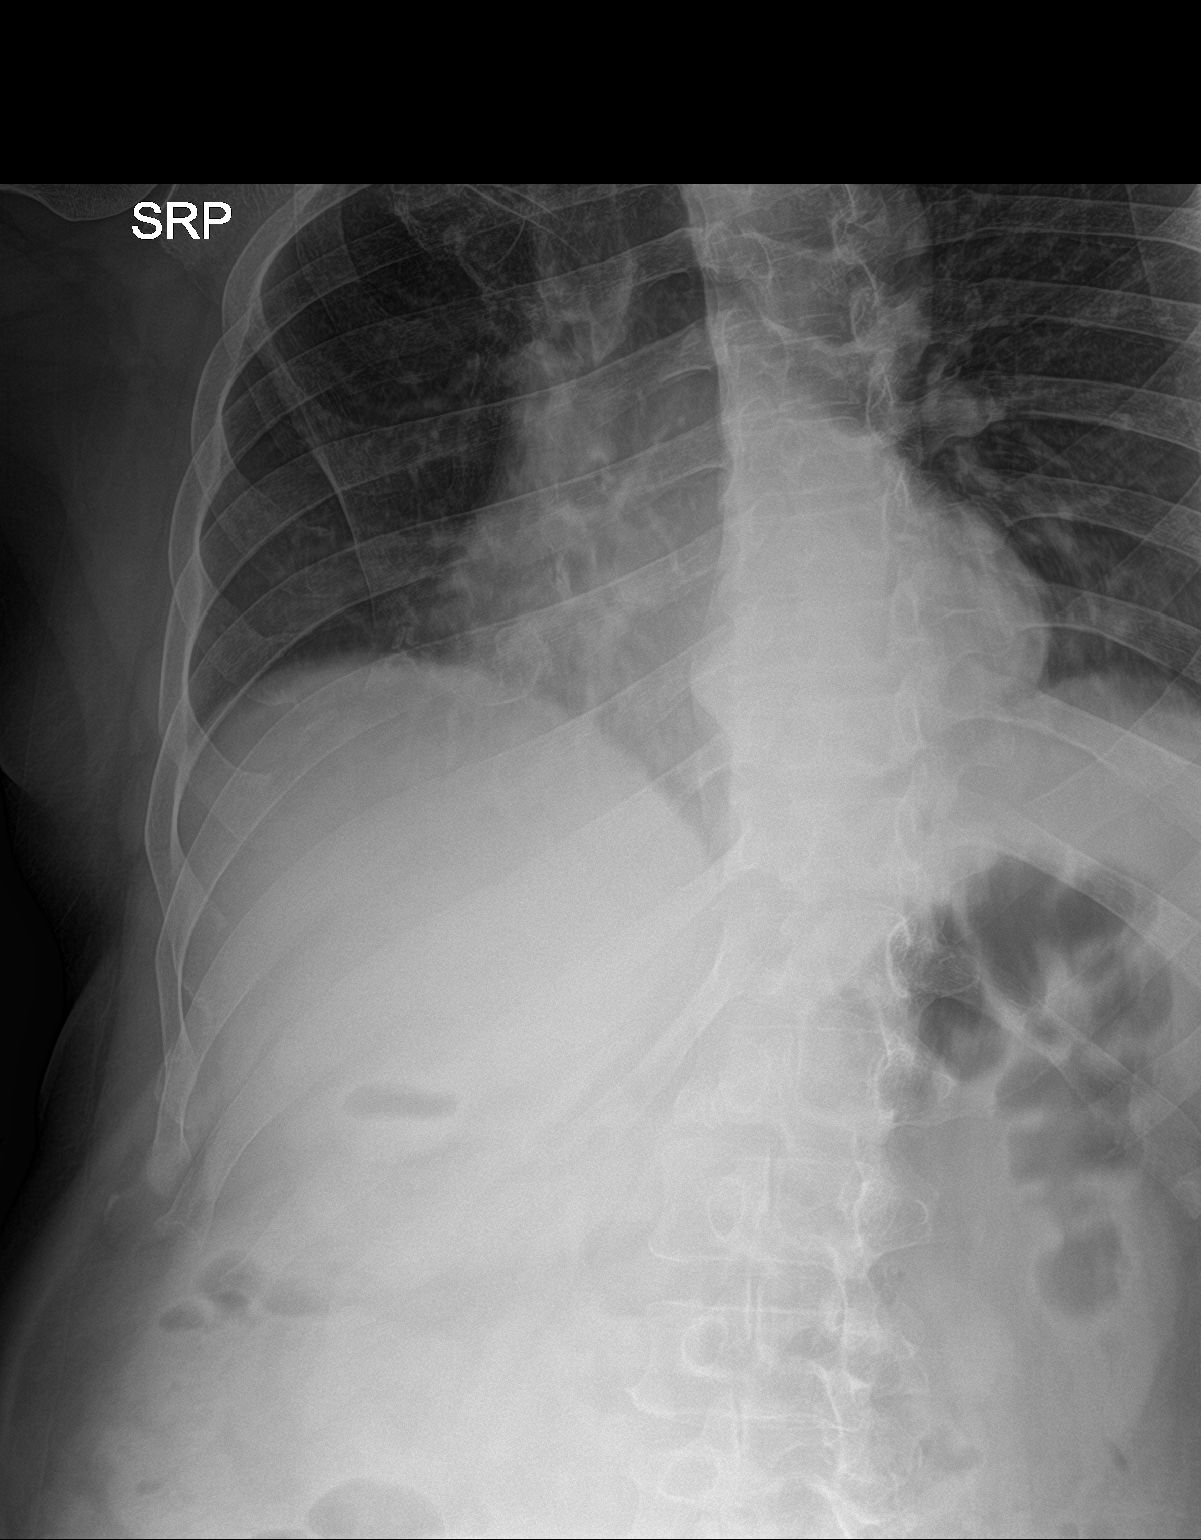

[3 of 3 positions shown; findings below may reference images not displayed]

FINDINGS: No fracture or other bone lesions are seen involving the ribs. There
is no evidence of pneumothorax or pleural effusion. Both lungs are
clear. Heart size and mediastinal contours are within normal limits.
IMPRESSION: Negative.

## 2020-09-28 ENCOUNTER — Other Ambulatory Visit: Payer: Medicare HMO

## 2020-09-28 ENCOUNTER — Other Ambulatory Visit: Payer: Self-pay

## 2020-09-28 DIAGNOSIS — B20 Human immunodeficiency virus [HIV] disease: Secondary | ICD-10-CM

## 2020-09-29 LAB — T-HELPER CELL (CD4) - (RCID CLINIC ONLY)
CD4 % Helper T Cell: 38 % (ref 33–65)
CD4 T Cell Abs: 482 /uL (ref 400–1790)

## 2020-09-30 LAB — CBC
HCT: 40.2 % (ref 38.5–50.0)
Hemoglobin: 13.1 g/dL — ABNORMAL LOW (ref 13.2–17.1)
MCH: 29.8 pg (ref 27.0–33.0)
MCHC: 32.6 g/dL (ref 32.0–36.0)
MCV: 91.6 fL (ref 80.0–100.0)
MPV: 12 fL (ref 7.5–12.5)
Platelets: 160 10*3/uL (ref 140–400)
RBC: 4.39 10*6/uL (ref 4.20–5.80)
RDW: 11.9 % (ref 11.0–15.0)
WBC: 4 10*3/uL (ref 3.8–10.8)

## 2020-09-30 LAB — COMPREHENSIVE METABOLIC PANEL
AG Ratio: 1.2 (calc) (ref 1.0–2.5)
ALT: 20 U/L (ref 9–46)
AST: 18 U/L (ref 10–35)
Albumin: 4.1 g/dL (ref 3.6–5.1)
Alkaline phosphatase (APISO): 62 U/L (ref 35–144)
BUN/Creatinine Ratio: 15 (calc) (ref 6–22)
BUN: 20 mg/dL (ref 7–25)
CO2: 24 mmol/L (ref 20–32)
Calcium: 9.7 mg/dL (ref 8.6–10.3)
Chloride: 102 mmol/L (ref 98–110)
Creat: 1.3 mg/dL — ABNORMAL HIGH (ref 0.70–1.25)
Globulin: 3.3 g/dL (calc) (ref 1.9–3.7)
Glucose, Bld: 188 mg/dL — ABNORMAL HIGH (ref 65–99)
Potassium: 4.5 mmol/L (ref 3.5–5.3)
Sodium: 135 mmol/L (ref 135–146)
Total Bilirubin: 0.5 mg/dL (ref 0.2–1.2)
Total Protein: 7.4 g/dL (ref 6.1–8.1)

## 2020-09-30 LAB — LIPID PANEL
Cholesterol: 220 mg/dL — ABNORMAL HIGH (ref <200)
HDL: 51 mg/dL (ref >=40)
LDL Cholesterol (Calc): 145 mg/dL (calc) — ABNORMAL HIGH
Non-HDL Cholesterol (Calc): 169 mg/dL (calc) — ABNORMAL HIGH (ref <130)
Total CHOL/HDL Ratio: 4.3 (calc) (ref <5.0)
Triglycerides: 118 mg/dL (ref <150)

## 2020-09-30 LAB — HIV-1 RNA QUANT-NO REFLEX-BLD
HIV 1 RNA Quant: NOT DETECTED Copies/mL
HIV-1 RNA Quant, Log: NOT DETECTED Log cps/mL

## 2020-09-30 LAB — RPR: RPR Ser Ql: NONREACTIVE

## 2020-10-13 ENCOUNTER — Telehealth (INDEPENDENT_AMBULATORY_CARE_PROVIDER_SITE_OTHER): Payer: Medicare HMO | Admitting: Internal Medicine

## 2020-10-13 ENCOUNTER — Other Ambulatory Visit: Payer: Self-pay

## 2020-10-13 DIAGNOSIS — B20 Human immunodeficiency virus [HIV] disease: Secondary | ICD-10-CM | POA: Diagnosis not present

## 2020-10-13 MED ORDER — BIKTARVY 50-200-25 MG PO TABS: 1.0000 | ORAL_TABLET | Freq: Every day | ORAL | 11 refills | Status: DC

## 2020-10-13 NOTE — Progress Notes (Signed)
Virtual Visit via Telephone Note  I connected with Benjamin Hunter on 10/13/20 at  1:45 PM EDT by telephone and verified that I am speaking with the correct person using two identifiers.  Location: Patient: Home Provider: [facility]   I discussed the limitations, risks, security and privacy concerns of performing an evaluation and management service by telephone and the availability of in person appointments. I also discussed with the patient that there may be a patient responsible charge related to this service. The patient expressed understanding and agreed to proceed.   History of Present Illness: I called and spoke with Benjamin Hunter today.  He denies any problems obtaining, taking or tolerating his Biktarvy and does not recall missing doses.  He says that he was a little concerned because his new primary care provider recently told him that he had stage III kidney disease.  She discussed the possibility of starting him on a new medication but he does not know which medicine she was referring to.  He has received his first COVID booster 3 months ago.  He is feeling well.   Observations/Objective: HIV 1 RNA Quant  Date Value  09/28/2020 Not Detected Copies/mL  03/10/2020 31 Copies/mL (H)  05/01/2019 <20 NOT DETECTED copies/mL   CD4 T Cell Abs (/uL)  Date Value  09/28/2020 482  03/10/2020 496  05/01/2019 488   CMP     Component Value Date/Time   NA 135 09/28/2020 0845   NA 137 11/13/2018 0915   K 4.5 09/28/2020 0845   CL 102 09/28/2020 0845   CO2 24 09/28/2020 0845   GLUCOSE 188 (H) 09/28/2020 0845   BUN 20 09/28/2020 0845   BUN 15 11/13/2018 0915   CREATININE 1.30 (H) 09/28/2020 0845   CALCIUM 9.7 09/28/2020 0845   PROT 7.4 09/28/2020 0845   PROT 7.5 11/13/2018 0915   ALBUMIN 4.1 04/10/2020 1109   ALBUMIN 4.5 11/13/2018 0915   AST 18 09/28/2020 0845   ALT 20 09/28/2020 0845   ALKPHOS 67 04/10/2020 1109   BILITOT 0.5 09/28/2020 0845   BILITOT 0.6 11/13/2018 0915   GFRNONAA 56  (L) 04/10/2020 1109   GFRNONAA 73 11/20/2013 0948   GFRAA 78 11/13/2018 0915   GFRAA 84 11/20/2013 0948    Assessment and Plan: His HIV infection remains under excellent, long-term control.  He will continue Biktarvy and follow-up after lab work in 1 year.  His creatinine has actually improved since it was done here last fall.  Follow Up Instructions: Continue Biktarvy Follow-up after lab work in 1 year I encouraged him to get more specific detail about what medication his PCP wants to start him on   I discussed the assessment and treatment plan with the patient. The patient was provided an opportunity to ask questions and all were answered. The patient agreed with the plan and demonstrated an understanding of the instructions.   The patient was advised to call back or seek an in-person evaluation if the symptoms worsen or if the condition fails to improve as anticipated.  I provided 16 minutes of non-face-to-face time during this encounter.   [name], MD

## 2020-10-14 LAB — COLOGUARD: COLOGUARD: NEGATIVE

## 2020-11-10 ENCOUNTER — Other Ambulatory Visit: Payer: Self-pay | Admitting: Infectious Diseases

## 2020-11-10 DIAGNOSIS — B20 Human immunodeficiency virus [HIV] disease: Secondary | ICD-10-CM

## 2020-11-16 ENCOUNTER — Other Ambulatory Visit: Payer: Self-pay

## 2020-11-16 ENCOUNTER — Encounter (HOSPITAL_COMMUNITY): Payer: Self-pay

## 2020-11-16 ENCOUNTER — Ambulatory Visit (HOSPITAL_COMMUNITY)
Admission: EM | Admit: 2020-11-16 | Discharge: 2020-11-16 | Disposition: A | Discharge status: Home / Self Care | Payer: Medicare HMO | Attending: Urgent Care | Admitting: Urgent Care

## 2020-11-16 DIAGNOSIS — Z79899 Other long term (current) drug therapy: Secondary | ICD-10-CM | POA: Insufficient documentation

## 2020-11-16 DIAGNOSIS — K529 Noninfective gastroenteritis and colitis, unspecified: Secondary | ICD-10-CM | POA: Diagnosis not present

## 2020-11-16 DIAGNOSIS — B20 Human immunodeficiency virus [HIV] disease: Secondary | ICD-10-CM | POA: Diagnosis not present

## 2020-11-16 DIAGNOSIS — R197 Diarrhea, unspecified: Secondary | ICD-10-CM

## 2020-11-16 DIAGNOSIS — Z20822 Contact with and (suspected) exposure to covid-19: Secondary | ICD-10-CM | POA: Diagnosis not present

## 2020-11-16 LAB — SARS CORONAVIRUS 2 (TAT 6-24 HRS): SARS Coronavirus 2: NEGATIVE

## 2020-11-16 MED ORDER — ONDANSETRON 8 MG PO TBDP: 8.0000 mg | ORAL_TABLET | Freq: Three times a day (TID) | ORAL | 0 refills | Status: DC | PRN

## 2020-11-16 MED ORDER — LOPERAMIDE HCL 2 MG PO CAPS: 2.0000 mg | ORAL_CAPSULE | Freq: Two times a day (BID) | ORAL | 0 refills | Status: AC | PRN

## 2020-11-16 NOTE — ED Triage Notes (Signed)
Pt presents with generalized abdominal cramping with some diarrhea and nausea X 3 days.

## 2020-11-16 NOTE — ED Provider Notes (Signed)
[place]   MRN: [id number] DOB: 07/15/1960  Subjective:   Benjamin Hunter is a 60 y.o. male presenting for 3-day history of acute onset diarrhea, watery stools, abdominal cramping, nausea without vomiting.  Patient states that he thinks it could have been from multiple sources including fresh fish from the market, eating out with his friend recently.  He also still drinks alcohol, has a few beers daily.  Denies fever, abdominal pain, bloody stools.  No recent long distance travel, hospitalizations, recent antibiotic use.  No new medications.  Patient did use a stool softener, laxative because he thought it would help with the symptoms but they only worsen his diarrhea.  No current facility-administered medications for this encounter.  Current Outpatient Medications:    Alcohol Swabs PADS, 1 each by Does not apply route 3 (three) times daily before meals., Disp: 90 each, Rfl: 11   amLODipine (NORVASC) 5 MG tablet, TAKE 1 TABLET(5 MG) BY MOUTH DAILY, Disp: 90 tablet, Rfl: 1   amoxicillin (AMOXIL) 875 MG tablet, Take 1 tablet (875 mg total) by mouth 2 (two) times daily., Disp: 20 tablet, Rfl: 0   atorvastatin (LIPITOR) 40 MG tablet, Take 1 tablet (40 mg total) by mouth daily., Disp: 90 tablet, Rfl: 1   B-D UF III MINI PEN NEEDLES 31G X 5 MM MISC, USE TO INJECT AT BEDTIME, Disp: 100 each, Rfl: 2   bictegravir-emtricitabine-tenofovir AF (BIKTARVY) 50-200-25 MG TABS tablet, Take 1 tablet by mouth daily., Disp: 30 tablet, Rfl: 11   Blood Glucose Monitoring Suppl (ONETOUCH VERIO) w/Device KIT, USE AS DIRECTED, Disp: 1 kit, Rfl: 0   cetirizine (ZYRTEC ALLERGY) 10 MG tablet, Take 1 tablet (10 mg total) by mouth daily., Disp: 90 tablet, Rfl: 0   diclofenac sodium (VOLTAREN) 1 % GEL, Apply 2 g topically 4 (four) times daily., Disp: 100 g, Rfl: 2   ezetimibe (ZETIA) 10 MG tablet, 1 daily (Patient not taking: No sig reported), Disp: 90 tablet, Rfl: 1   fluticasone (FLONASE) 50 MCG/ACT  nasal spray, Place 2 sprays into both nostrils daily., Disp: 16 g, Rfl: 3   glipiZIDE (GLUCOTROL) 10 MG tablet, TAKE 1 TABLET(10 MG) BY MOUTH TWICE DAILY BEFORE A MEAL, Disp: 180 tablet, Rfl: 1   glucose blood (ONETOUCH VERIO) test strip, Use as instructed, Disp: 100 each, Rfl: 12   hydrocortisone (ANUSOL-HC) 2.5 % rectal cream, Apply to rectal hemorrhoid BID PRN, Disp: 30 g, Rfl: 0   hydrocortisone cream 1 %, APP AA QID FOR 7 DAYS, Disp: , Rfl:    hydrocortisone-pramoxine (ANALPRAM HC) 2.5-1 % rectal cream, Place 1 application rectally 3 (three) times daily., Disp: 30 g, Rfl: 1   hydrOXYzine (ATARAX/VISTARIL) 25 MG tablet, Take 1 tablet (25 mg total) by mouth every 6 (six) hours., Disp: 12 tablet, Rfl: 0   Insulin Glargine (LANTUS SOLOSTAR) 100 UNIT/ML Solostar Pen, Inject 35 Units into the skin at bedtime., Disp: 15 mL, Rfl: 1   Lancet Devices (ACCU-CHEK SOFTCLIX) lancets, Use as instructed for 3 times daily testing of blood sugar., Disp: 1 each, Rfl: 0   LANTUS 100 UNIT/ML injection, , Disp: , Rfl:    montelukast (SINGULAIR) 10 MG tablet, Take 1 tablet (10 mg total) by mouth at bedtime., Disp: 90 tablet, Rfl: 0   ONETOUCH DELICA LANCETS 32Z MISC, USE AS DIRECTED, Disp: 100 each, Rfl: 11   polyethylene glycol (MIRALAX) 17 g packet, Take 17 g by mouth daily as needed for moderate constipation or severe constipation., Disp:  14 each, Rfl: 0   predniSONE (STERAPRED UNI-PAK 21 TAB) 10 MG (21) TBPK tablet, Take by mouth daily. Take 6 tabs by mouth daily  for 2 days, then 5 tabs for 2 days, then 4 tabs for 2 days, then 3 tabs for 2 days, 2 tabs for 2 days, then 1 tab by mouth daily for 2 days, Disp: 42 tablet, Rfl: 0   sodium chloride (OCEAN) 0.65 % SOLN nasal spray, Place 1 spray into both nostrils as needed for congestion. (Patient not taking: No sig reported), Disp: 88 mL, Rfl: 0   triamcinolone cream (KENALOG) 0.1 %, Apply 1 application topically 2 (two) times daily., Disp: 30 g, Rfl: 0    trimethoprim-polymyxin b (POLYTRIM) ophthalmic solution, Place 1 drop into the left eye every 4 (four) hours., Disp: 10 mL, Rfl: 0   valACYclovir (VALTREX) 1000 MG tablet, TAKE 1 TABLET(1000 MG) BY MOUTH TWICE DAILY, Disp: 20 tablet, Rfl: 0   Allergies  Allergen Reactions   Shrimp [Shellfish Allergy] Nausea And Vomiting   Lisinopril Rash    Palmar rash     Past Medical History:  Diagnosis Date   Arthritis    Depression    Diabetes mellitus    HIV (human immunodeficiency virus infection) ([place])    Hyperlipidemia    Hypertension      History reviewed. No pertinent surgical history.  Family History  Problem Relation Age of Onset   Hypertension Mother    Heart failure Father     Social History   Tobacco Use   Smoking status: Never   Smokeless tobacco: Never  Vaping Use   Vaping Use: Never used  Substance Use Topics   Alcohol use: Yes    Alcohol/week: 21.0 standard drinks    Types: 21 Cans of beer per week   Drug use: No    ROS   Objective:   Vitals: BP 135/79 (BP Location: Right Arm)   Pulse 85   Temp 98.1 F (36.7 C) (Oral)   Resp 17   SpO2 95%   Physical Exam Constitutional:      General: He is not in acute distress.    Appearance: Normal appearance. He is well-developed. He is not ill-appearing, toxic-appearing or diaphoretic.  HENT:     Head: Normocephalic and atraumatic.     Right Ear: External ear normal.     Left Ear: External ear normal.     Nose: Nose normal.     Mouth/Throat:     Mouth: Mucous membranes are moist.     Pharynx: Oropharynx is clear.  Eyes:     General: No scleral icterus.    Extraocular Movements: Extraocular movements intact.     Pupils: Pupils are equal, round, and reactive to light.  Cardiovascular:     Rate and Rhythm: Normal rate and regular rhythm.     Heart sounds: Normal heart sounds. No murmur heard.   No friction rub. No gallop.  Pulmonary:     Effort: Pulmonary effort is normal. No respiratory distress.      Breath sounds: Normal breath sounds. No stridor. No wheezing, rhonchi or rales.  Abdominal:     General: Bowel sounds are increased. There is no distension.     Palpations: Abdomen is soft. There is no mass.     Tenderness: There is no abdominal tenderness. There is no guarding or rebound.  Skin:    General: Skin is warm and dry.  Neurological:     Mental Status: He is alert  and oriented to person, place, and time.  Psychiatric:        Mood and Affect: Mood normal.        Behavior: Behavior normal.        Thought Content: Thought content normal.      Assessment and Plan :   PDMP not reviewed this encounter.  1. Gastroenteritis   2. Diarrhea, unspecified type     Will manage for suspected viral gastroenteritis, colitis with supportive care.  Recommended patient hydrate well, eat light meals and maintain electrolytes.  Will use Zofran and Imodium for nausea, vomiting and diarrhea. Counseled patient on potential for adverse effects with medications prescribed/recommended today, ER and return-to-clinic precautions discussed, patient verbalized understanding.    Jaynee Eagles, PA-C 11/16/20 1001

## 2020-11-16 NOTE — Discharge Instructions (Addendum)

## 2020-11-23 ENCOUNTER — Telehealth: Payer: Self-pay

## 2020-11-23 NOTE — Telephone Encounter (Signed)
Patient called complaining of constipation x 1 week. Patient has been taking OTC stool softeners. I have advised the patient to try eating foods high in fiber such as fruits,vegetables, drinking plenty of water and also OTC Miralax. Patient verbalized understanding and will call back if no improvement.  Benjamin Hunter T Pricilla Loveless

## 2020-11-25 ENCOUNTER — Other Ambulatory Visit: Payer: Self-pay | Admitting: Family

## 2020-11-25 DIAGNOSIS — B20 Human immunodeficiency virus [HIV] disease: Secondary | ICD-10-CM

## 2020-12-01 ENCOUNTER — Ambulatory Visit: Payer: Medicare HMO

## 2021-03-08 ENCOUNTER — Telehealth: Payer: Self-pay

## 2021-03-08 NOTE — Telephone Encounter (Signed)
Received fax today from [facility] stating SPAP is expired. Per chart pt is approved 02/27/21-08/27/20. Will have financial counselor look into SPAP concerns.  Juanita Laster, RMA

## 2021-04-12 ENCOUNTER — Encounter: Payer: Self-pay | Admitting: Internal Medicine

## 2021-04-13 ENCOUNTER — Other Ambulatory Visit: Payer: Self-pay | Admitting: Internal Medicine

## 2021-04-13 DIAGNOSIS — B20 Human immunodeficiency virus [HIV] disease: Secondary | ICD-10-CM

## 2021-04-27 ENCOUNTER — Encounter (HOSPITAL_COMMUNITY): Payer: Self-pay | Admitting: Emergency Medicine

## 2021-04-27 ENCOUNTER — Ambulatory Visit (HOSPITAL_COMMUNITY)
Admission: EM | Admit: 2021-04-27 | Discharge: 2021-04-27 | Disposition: A | Discharge status: Home / Self Care | Payer: Medicare HMO | Attending: Emergency Medicine | Admitting: Emergency Medicine

## 2021-04-27 ENCOUNTER — Other Ambulatory Visit: Payer: Self-pay

## 2021-04-27 DIAGNOSIS — Z20822 Contact with and (suspected) exposure to covid-19: Secondary | ICD-10-CM | POA: Diagnosis not present

## 2021-04-27 DIAGNOSIS — J069 Acute upper respiratory infection, unspecified: Secondary | ICD-10-CM | POA: Diagnosis not present

## 2021-04-27 DIAGNOSIS — R051 Acute cough: Secondary | ICD-10-CM | POA: Insufficient documentation

## 2021-04-27 LAB — SARS CORONAVIRUS 2 (TAT 6-24 HRS): SARS Coronavirus 2: NEGATIVE

## 2021-04-27 MED ORDER — ALBUTEROL SULFATE HFA 108 (90 BASE) MCG/ACT IN AERS
2.0000 | INHALATION_SPRAY | Freq: Once | RESPIRATORY_TRACT | Status: AC
  Administered 2021-04-27: 2 via RESPIRATORY_TRACT

## 2021-04-27 NOTE — ED Triage Notes (Signed)
Pt is present today with cough and congestion. Pt states sx started x2 days ago.

## 2021-04-27 NOTE — ED Provider Notes (Signed)
[place]    CSN: [id number] Arrival date & time: 04/27/21  0300      History   Chief Complaint Chief Complaint  Patient presents with   Cough    congestion    HPI Benjamin Hunter is a 60 y.o. male. Pt reports feeling ill since 04/24/21. Reports cough, nasal congestion. Denies sore throat, fever, body aches. Has been treating symptoms with OTC cold medicine for some temporary relief. He is worried he could have bronchitis and wants a "shot in his butt" to take care of it. Reports he has had this treatment before and it helped him feel better. Has tested himself 2x at home for covid, both negative. Would like to be tested for covid again.    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 No pertinent surgical history.     Home Medications    Prior to Admission medications   Medication Sig Start Date End Date Taking? Authorizing Provider  Alcohol Swabs PADS 1 each by Does not apply route 3 (three) times daily before meals. 11/13/18   Charlott Rakes, MD  amLODipine (NORVASC) 5 MG tablet TAKE 1 TABLET(5 MG) BY  MOUTH DAILY 11/13/18   Charlott Rakes, MD  amoxicillin (AMOXIL) 875 MG tablet Take 1 tablet (875 mg total) by mouth 2 (two) times daily. 06/06/20   Fransico Meadow, PA-C  atorvastatin (LIPITOR) 40 MG tablet Take 1 tablet (40 mg total) by mouth daily. 11/13/18   Charlott Rakes, MD  B-D UF III MINI PEN NEEDLES 31G X 5 MM MISC USE TO INJECT AT BEDTIME 06/21/18   Charlott Rakes, MD  bictegravir-emtricitabine-tenofovir AF (BIKTARVY) 50-200-25 MG TABS tablet Take 1 tablet by mouth daily. 10/13/20   Michel Bickers, MD  Blood Glucose Monitoring Suppl [facility] VERIO) w/Device KIT USE AS DIRECTED 03/14/18   Charlott Rakes, MD  cetirizine (ZYRTEC ALLERGY) 10 MG tablet Take 1 tablet (10 mg total) by mouth daily. 08/19/20   Jaynee Eagles, PA-C  diclofenac sodium (VOLTAREN) 1 % GEL Apply 2 g topically 4 (four) times daily. 09/08/18   Robyn Haber, MD  ezetimibe (ZETIA) 10 MG tablet 1 daily Patient not taking: No sig reported 11/13/18   Charlott Rakes, MD  fluticasone (FLONASE) 50 MCG/ACT nasal spray Place 2 sprays into both nostrils daily. 11/13/18   Charlott Rakes, MD  glipiZIDE (GLUCOTROL) 10 MG tablet TAKE 1 TABLET(10 MG) BY MOUTH TWICE DAILY BEFORE A MEAL 11/13/18   Charlott Rakes, MD  glucose blood (ONETOUCH VERIO) test strip Use as instructed 03/14/18   Charlott Rakes, MD  hydrocortisone (ANUSOL-HC) 2.5 % rectal cream  Apply to rectal hemorrhoid BID PRN 08/20/18   Ladell Pier, MD  hydrocortisone cream 1 % APP AA QID FOR 7 DAYS 05/21/19   [provider]  hydrocortisone-pramoxine (ANALPRAM HC) 2.5-1 % rectal cream Place 1 application rectally 3 (three) times daily. 08/14/18   Charlott Rakes, MD  hydrOXYzine (ATARAX/VISTARIL) 25 MG tablet Take 1 tablet (25 mg total) by mouth every 6 (six) hours. 11/22/19   Marney Setting, NP  Insulin Glargine (LANTUS SOLOSTAR) 100 UNIT/ML Solostar Pen Inject 35 Units into the skin at bedtime. 03/29/19   Charlott Rakes, MD  Lancet Devices [facility]) lancets Use as instructed for 3 times daily testing of blood sugar. 12/07/15   Charlott Rakes, MD  LANTUS 100 UNIT/ML injection  08/14/19   [provider]  loperamide (IMODIUM) 2 MG capsule Take 1 capsule (2 mg total) by mouth 2 (two) times daily as needed for diarrhea or loose stools. 11/16/20   Jaynee Eagles, PA-C  montelukast (SINGULAIR) 10 MG tablet Take 1 tablet (10 mg total) by mouth at bedtime. 08/19/20   Jaynee Eagles, PA-C  ondansetron (ZOFRAN-ODT) 8 MG disintegrating tablet Take 1 tablet (8 mg total) by mouth every 8 (eight) hours as needed for nausea or vomiting. 11/16/20   Jaynee Eagles, PA-C  ONETOUCH DELICA LANCETS 54Y MISC USE AS DIRECTED 03/14/18   Charlott Rakes, MD  polyethylene glycol (MIRALAX) 17 g packet Take 17 g by mouth daily as needed for moderate constipation or severe constipation. 04/10/20   Scot Jun, FNP  predniSONE (STERAPRED UNI-PAK 21 TAB) 10 MG (21) TBPK tablet Take by mouth daily. Take 6 tabs by mouth daily  for 2 days, then 5 tabs for 2 days, then 4 tabs for 2 days, then 3 tabs for 2 days, 2 tabs for 2 days, then 1 tab by mouth daily for 2 days 11/22/19   Marney Setting, NP  sodium chloride (OCEAN) 0.65 % SOLN nasal spray Place 1 spray into both nostrils as needed for congestion. Patient not taking: No sig reported 02/07/20   Darr, Edison Nasuti, PA-C  triamcinolone cream (KENALOG) 0.1 % Apply 1 application topically 2 (two) times daily. 09/20/20   Volney American, PA-C  trimethoprim-polymyxin b (POLYTRIM) ophthalmic solution Place 1 drop into the left eye every 4 (four) hours. 09/20/20   Volney American, PA-C  valACYclovir (VALTREX) 1000 MG tablet TAKE 1 TABLET(1000 MG) BY MOUTH TWICE DAILY 10/01/18   Charlott Rakes, MD    Family History Family History  Problem Relation Age of Onset   Hypertension Mother    Heart failure Father     Social History Social History   Tobacco Use   Smoking status: Never   Smokeless tobacco: Never   Vaping Use   Vaping Use: Never used  Substance Use Topics   Alcohol use: Yes    Alcohol/week: 21.0 standard drinks    Types: 21 Cans of beer per week   Drug use: No     Allergies   Shrimp [shellfish allergy] and Lisinopril   Review of Systems Review of Systems  Constitutional:  Negative for chills and fever.  HENT:  Positive for congestion and postnasal drip. Negative for sore throat.   Respiratory:  Positive for cough. Negative for shortness of breath and wheezing.     Physical Exam Triage Vital Signs ED Triage Vitals  Enc Vitals Group     BP 04/27/21 1157 (!) 160/80     Pulse Rate 04/27/21 1157 72  Resp 04/27/21 1157 18     Temp 04/27/21 1157 97.8 F (36.6 C)     Temp Source 04/27/21 1157 Oral     SpO2 04/27/21 1157 100 %     Weight --      Height --      Head Circumference --      Peak Flow --      Pain Score 04/27/21 1155 5     Pain Loc --      Pain Edu? --      Excl. in [place]? --    No data found.  Updated Vital Signs BP (!) 160/80 (BP Location: Left Arm)   Pulse 72   Temp 97.8 F (36.6 C) (Oral)   Resp 18   SpO2 100%   Visual Acuity Right Eye Distance:   Left Eye Distance:   Bilateral Distance:    Right Eye Near:   Left Eye Near:    Bilateral Near:     Physical Exam Constitutional:      Appearance: Normal appearance. He is not ill-appearing.  HENT:     Right Ear: Tympanic membrane, ear canal and external ear normal.     Left Ear: Tympanic membrane, ear canal and external ear normal.     Nose: Congestion present.     Mouth/Throat:     Mouth: Mucous membranes are moist.     Pharynx: Oropharynx is clear.  Cardiovascular:     Rate and Rhythm: Normal rate and regular rhythm.  Pulmonary:     Effort: Pulmonary effort is normal.     Breath sounds: Normal breath sounds.     Comments: No cough observed during H&P Lymphadenopathy:     Cervical: No cervical adenopathy.  Neurological:     Mental Status: He is alert.     UC Treatments /  Results  Labs (all labs ordered are listed, but only abnormal results are displayed) Labs Reviewed  SARS CORONAVIRUS 2 (TAT 6-24 HRS)    EKG   Radiology No results found.  Procedures Procedures (including critical care time)  Medications Ordered in UC Medications  albuterol (VENTOLIN HFA) 108 (90 Base) MCG/ACT inhaler 2 puff (2 puffs Inhalation Given 04/27/21 1222)    Initial Impression / Assessment and Plan / UC Course  I have reviewed the triage vital signs and the nursing notes.  Pertinent labs & imaging results that were available during my care of the patient were reviewed by me and considered in my medical decision making (see chart for details).    Pt is worried about bronchitis but at this time, lungs are clear and I see no evidence of bronchitis. Pt requests an albuterol inhaler in case he does get bronchitis - given inhaler here in Urgent Care. Pt to continue OTC cold medicine to manage sx. Repeated COvid test.   Final Clinical Impressions(s) / UC Diagnoses   Final diagnoses:  Viral URI with cough     Discharge Instructions      You do not have bronchitis at this time. You can use the albuterol inhaler 2 puffs every 4-6 hours as needed for shortness of breath or wheezing. Keep using your over the counter cold medicine as directed on the bottle. Make sure to drink plenty of liquids to stay hydrated    ED Prescriptions   None    PDMP not reviewed this encounter.   Carvel Getting, NP 04/27/21 1243

## 2021-04-27 NOTE — Discharge Instructions (Signed)
You do not have bronchitis at this time. You can use the albuterol inhaler 2 puffs every 4-6 hours as needed for shortness of breath or wheezing. Keep using your over the counter cold medicine as directed on the bottle. Make sure to drink plenty of liquids to stay hydrated

## 2021-05-04 ENCOUNTER — Other Ambulatory Visit: Payer: Self-pay

## 2021-05-04 ENCOUNTER — Ambulatory Visit (HOSPITAL_COMMUNITY)
Admission: EM | Admit: 2021-05-04 | Discharge: 2021-05-04 | Disposition: A | Discharge status: Home / Self Care | Payer: Medicare HMO | Attending: Emergency Medicine | Admitting: Emergency Medicine

## 2021-05-04 ENCOUNTER — Encounter (HOSPITAL_COMMUNITY): Payer: Self-pay

## 2021-05-04 DIAGNOSIS — J069 Acute upper respiratory infection, unspecified: Secondary | ICD-10-CM

## 2021-05-04 MED ORDER — METHYLPREDNISOLONE SODIUM SUCC 125 MG IJ SOLR
INTRAMUSCULAR | Status: AC
  Filled 2021-05-04: qty 2

## 2021-05-04 MED ORDER — METHYLPREDNISOLONE SODIUM SUCC 125 MG IJ SOLR
60.0000 mg | Freq: Once | INTRAMUSCULAR | Status: AC
  Administered 2021-05-04: 60 mg via INTRAMUSCULAR

## 2021-05-04 NOTE — ED Provider Notes (Signed)
[place]    CSN: [id number] Arrival date & time: 05/04/21  4098      History   Chief Complaint Chief Complaint  Patient presents with   Cough    HPI Benjamin Hunter is a 60 y.o. male.   Patient presents with nasal congestion, mild sore throat and productive cough for 10 days.  Has been using her albuterol inhaler and over-the-counter medication which has been helpful.  Patient endorses that his symptoms are improved but he just wants to make sure that it has not progressed into something more serious such as pneumonia.  History of HIV, DM, HTN, HLD, arthritis.  Denies fever, chills, body aches, rhinorrhea, shortness of breath, wheezing, abdominal pain, nausea, vomiting, diarrhea, chest pain, headaches.   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 No pertinent surgical history.     Home Medications    Prior to Admission medications   Medication Sig Start Date End Date Taking? Authorizing Provider  Alcohol Swabs PADS 1 each by Does not apply route 3 (three) times daily before meals. 11/13/18  Yes Newlin, Enobong, MD  amLODipine (NORVASC) 5 MG tablet TAKE 1 TABLET(5 MG) BY MOUTH DAILY 11/13/18  Yes Newlin,  Enobong, MD  atorvastatin (LIPITOR) 40 MG tablet Take 1 tablet (40 mg total) by mouth daily. 11/13/18  Yes Newlin, Charlane Ferretti, MD  B-D UF III MINI PEN NEEDLES 31G X 5 MM MISC USE TO INJECT AT BEDTIME 06/21/18  Yes Newlin, Enobong, MD  bictegravir-emtricitabine-tenofovir AF (BIKTARVY) 50-200-25 MG TABS tablet Take 1 tablet by mouth daily. 10/13/20  Yes Michel Bickers, MD  Blood Glucose Monitoring Suppl [facility] VERIO) w/Device KIT USE AS DIRECTED 03/14/18  Yes Charlott Rakes, MD  cetirizine (ZYRTEC ALLERGY) 10 MG tablet Take 1 tablet (10 mg total) by mouth daily. 08/19/20  Yes Jaynee Eagles, PA-C  diclofenac sodium (VOLTAREN) 1 % GEL Apply 2 g topically 4 (four) times daily. 09/08/18  Yes Robyn Haber, MD  fluticasone (FLONASE) 50 MCG/ACT nasal spray Place 2 sprays into both nostrils daily. 11/13/18  Yes Newlin, Enobong, MD  glipiZIDE (GLUCOTROL) 10 MG tablet TAKE 1 TABLET(10 MG) BY MOUTH TWICE DAILY BEFORE A MEAL 11/13/18  Yes Newlin, Enobong, MD  glucose blood (ONETOUCH VERIO) test strip Use as instructed 03/14/18  Yes Newlin, Charlane Ferretti, MD  hydrocortisone (ANUSOL-HC) 2.5 % rectal cream Apply to rectal hemorrhoid BID PRN 08/20/18  Yes Ladell Pier, MD  hydrocortisone cream 1 % APP AA QID FOR 7 DAYS 05/21/19  Yes [provider]  hydrocortisone-pramoxine (ANALPRAM HC) 2.5-1 % rectal cream Place 1 application rectally 3 (three) times daily. 08/14/18  Yes Charlott Rakes, MD  hydrOXYzine (ATARAX/VISTARIL) 25 MG tablet Take 1 tablet (25 mg total)  by mouth every 6 (six) hours. 11/22/19  Yes Marney Setting, NP  Insulin Glargine (LANTUS SOLOSTAR) 100 UNIT/ML Solostar Pen Inject 35 Units into the skin at bedtime. 03/29/19  Yes Charlott Rakes, MD  Lancet Devices [facility]) lancets Use as instructed for 3 times daily testing of blood sugar. 12/07/15  Yes Charlott Rakes, MD  LANTUS 100 UNIT/ML injection  08/14/19  Yes [provider]  loperamide (IMODIUM) 2 MG capsule Take 1  capsule (2 mg total) by mouth 2 (two) times daily as needed for diarrhea or loose stools. 11/16/20  Yes Jaynee Eagles, PA-C  montelukast (SINGULAIR) 10 MG tablet Take 1 tablet (10 mg total) by mouth at bedtime. 08/19/20  Yes Jaynee Eagles, PA-C  ondansetron (ZOFRAN-ODT) 8 MG disintegrating tablet Take 1 tablet (8 mg total) by mouth every 8 (eight) hours as needed for nausea or vomiting. 11/16/20  Yes Jaynee Eagles, PA-C  [facility] DELICA LANCETS 51V MISC USE AS DIRECTED 03/14/18  Yes Charlott Rakes, MD  polyethylene glycol (MIRALAX) 17 g packet Take 17 g by mouth daily as needed for moderate constipation or severe constipation. 04/10/20  Yes Scot Jun, FNP  predniSONE (STERAPRED UNI-PAK 21 TAB) 10 MG (21) TBPK tablet Take by mouth daily. Take 6 tabs by mouth daily  for 2 days, then 5 tabs for 2 days, then 4 tabs for 2 days, then 3 tabs for 2 days, 2 tabs for 2 days, then 1 tab by mouth daily for 2 days 11/22/19  Yes Morley Kos L, NP  sodium chloride (OCEAN) 0.65 % SOLN nasal spray Place 1 spray into both nostrils as needed for congestion. 02/07/20  Yes Darr, Edison Nasuti, PA-C  triamcinolone cream (KENALOG) 0.1 % Apply 1 application topically 2 (two) times daily. 09/20/20  Yes Volney American, PA-C  valACYclovir (VALTREX) 1000 MG tablet TAKE 1 TABLET(1000 MG) BY MOUTH TWICE DAILY 10/01/18  Yes Charlott Rakes, MD  amoxicillin (AMOXIL) 875 MG tablet Take 1 tablet (875 mg total) by mouth 2 (two) times daily. 06/06/20   Fransico Meadow, PA-C  ezetimibe (ZETIA) 10 MG tablet 1 daily Patient not taking: Reported on 03/31/2020 11/13/18   Charlott Rakes, MD  trimethoprim-polymyxin b (POLYTRIM) ophthalmic solution Place 1 drop into the left eye every 4 (four) hours. 09/20/20   Volney American, PA-C    Family History Family History  Problem Relation Age of Onset   Hypertension Mother    Heart failure Father     Social History Social History   Tobacco Use   Smoking status: Never   Smokeless  tobacco: Never  Vaping Use   Vaping Use: Never used  Substance Use Topics   Alcohol use: Yes    Alcohol/week: 21.0 standard drinks    Types: 21 Cans of beer per week   Drug use: No     Allergies   Shrimp [shellfish allergy] and Lisinopril   Review of Systems Review of Systems  Constitutional: Negative.   HENT:  Positive for congestion and sore throat. Negative for dental problem, drooling, ear discharge, ear pain, facial swelling, hearing loss, mouth sores, nosebleeds, postnasal drip, rhinorrhea, sinus pressure, sinus pain, sneezing, tinnitus, trouble swallowing and voice change.   Respiratory:  Positive for cough. Negative for apnea, choking, chest tightness, shortness of breath, wheezing and stridor.   Cardiovascular: Negative.   Gastrointestinal: Negative.   Skin: Negative.   Neurological: Negative.     Physical Exam Triage Vital Signs ED Triage Vitals  Enc Vitals Group  BP 05/04/21 0854 133/88     Pulse Rate 05/04/21 0854 92     Resp 05/04/21 0854 18     Temp 05/04/21 0854 98.2 F (36.8 C)     Temp Source 05/04/21 0854 Oral     SpO2 05/04/21 0854 97 %     Weight 05/04/21 0851 199 lb (90.3 kg)     Height 05/04/21 0851 '5\' 11"'  (1.803 m)     Head Circumference --      Peak Flow --      Pain Score 05/04/21 0851 4     Pain Loc --      Pain Edu? --      Excl. in [place]? --    No data found.  Updated Vital Signs BP 133/88 (BP Location: Right Arm)   Pulse 92   Temp 98.2 F (36.8 C) (Oral)   Resp 18   Ht '5\' 11"'  (1.803 m)   Wt 199 lb (90.3 kg)   SpO2 97%   BMI 27.75 kg/m   Visual Acuity Right Eye Distance:   Left Eye Distance:   Bilateral Distance:    Right Eye Near:   Left Eye Near:    Bilateral Near:     Physical Exam Constitutional:      Appearance: Normal appearance. He is normal weight.  HENT:     Head: Normocephalic.     Right Ear: Tympanic membrane, ear canal and external ear normal.     Left Ear: Tympanic membrane, ear canal and external ear  normal.     Nose: Congestion present. No rhinorrhea.     Mouth/Throat:     Mouth: Mucous membranes are moist.     Pharynx: Posterior oropharyngeal erythema present.  Eyes:     Extraocular Movements: Extraocular movements intact.  Cardiovascular:     Rate and Rhythm: Normal rate and regular rhythm.     Pulses: Normal pulses.     Heart sounds: Normal heart sounds.  Pulmonary:     Effort: Pulmonary effort is normal.     Breath sounds: Wheezing present.  Musculoskeletal:        General: Normal range of motion.     Cervical back: Normal range of motion and neck supple.  Skin:    General: Skin is warm and dry.  Neurological:     Mental Status: He is alert and oriented to person, place, and time. Mental status is at baseline.  Psychiatric:        Mood and Affect: Mood normal.        Behavior: Behavior normal.     UC Treatments / Results  Labs (all labs ordered are listed, but only abnormal results are displayed) Labs Reviewed - No data to display  EKG   Radiology No results found.  Procedures Procedures (including critical care time)  Medications Ordered in UC Medications - No data to display  Initial Impression / Assessment and Plan / UC Course  I have reviewed the triage vital signs and the nursing notes.  Pertinent labs & imaging results that were available during my care of the patient were reviewed by me and considered in my medical decision making (see chart for details).  Viral URI with cough  1.  Methylprednisolone 60 mg IM now 2.  Continue use of over-the-counter medications and albuterol inhaler for symptomatic treatment 3.  Urgent care follow-up as below Final Clinical Impressions(s) / UC Diagnoses   Final diagnoses:  None   Discharge Instructions   None  ED Prescriptions   None    PDMP not reviewed this encounter.   Hans Eden, [place] 05/04/21 [phone]

## 2021-05-04 NOTE — ED Triage Notes (Signed)
Pt is here for a follow up from a recent visit. Pt states that he is still having some nasal drainage with cough and congestion. Pt states that the medication is helping and that he is feeling better.

## 2021-05-04 NOTE — Discharge Instructions (Signed)
Your symptoms today are most likely being caused by a virus and should steadily improve in time it can take up to 7 to 10 days before you truly start to see a turnaround however things will get better, since your symptoms have been improving we can continue the current treatment plan, of course if symptoms begin to worsen you may return to urgent care for evaluation  You have been given a steroid injection today in office to help reduce any inflammation to the upper airways and reduce wheezing, please continue use of your albuterol inhaler as needed    You can take Tylenol and/or Ibuprofen as needed for fever reduction and pain relief.   For cough: honey 1/2 to 1 teaspoon (you can dilute the honey in water or another fluid).  You can also use guaifenesin and dextromethorphan for cough. You can use a humidifier for chest congestion and cough.  If you don't have a humidifier, you can sit in the bathroom with the hot shower running.      For sore throat: try warm salt water gargles, cepacol lozenges, throat spray, warm tea or water with lemon/honey, popsicles or ice, or OTC cold relief medicine for throat discomfort.   For congestion: take a daily anti-histamine like Zyrtec, Claritin, and a oral decongestant, such as pseudoephedrine.  You can also use Flonase 1-2 sprays in each nostril daily.   It is important to stay hydrated: drink plenty of fluids (water, gatorade/powerade/pedialyte, juices, or teas) to keep your throat moisturized and help further relieve irritation/discomfort.

## 2021-05-15 ENCOUNTER — Ambulatory Visit (HOSPITAL_COMMUNITY)
Admission: EM | Admit: 2021-05-15 | Discharge: 2021-05-15 | Disposition: A | Discharge status: Home / Self Care | Payer: Medicare HMO | Attending: Physician Assistant | Admitting: Physician Assistant

## 2021-05-15 ENCOUNTER — Encounter (HOSPITAL_COMMUNITY): Payer: Self-pay | Admitting: Emergency Medicine

## 2021-05-15 ENCOUNTER — Ambulatory Visit (INDEPENDENT_AMBULATORY_CARE_PROVIDER_SITE_OTHER): Payer: Medicare HMO

## 2021-05-15 ENCOUNTER — Other Ambulatory Visit: Payer: Self-pay

## 2021-05-15 DIAGNOSIS — R059 Cough, unspecified: Secondary | ICD-10-CM

## 2021-05-15 DIAGNOSIS — U071 COVID-19: Secondary | ICD-10-CM | POA: Diagnosis not present

## 2021-05-15 DIAGNOSIS — J0140 Acute pansinusitis, unspecified: Secondary | ICD-10-CM | POA: Insufficient documentation

## 2021-05-15 DIAGNOSIS — Z21 Asymptomatic human immunodeficiency virus [HIV] infection status: Secondary | ICD-10-CM | POA: Diagnosis not present

## 2021-05-15 MED ORDER — AMOXICILLIN-POT CLAVULANATE 875-125 MG PO TABS: 1.0000 | ORAL_TABLET | Freq: Two times a day (BID) | ORAL | 0 refills | Status: DC

## 2021-05-15 NOTE — ED Provider Notes (Signed)
[name]    CSN: [id number] Arrival date & time: 05/15/21  1002      History   Chief Complaint Chief Complaint  Patient presents with   Cough    HPI Benjamin Hunter is a 60 y.o. male.   Patient presents today with a 3-week history of cough.  He was initially seen by our clinic on 04/27/2021 which point is negative for flu and COVID.  He was seen again on 05/04/2021 at which point he was given steroid injection.  He continues to have significant productive cough with thick sputum.  He reports that sinus pressure, nasal congestion, headache.  Denies any fever, chest pain, shortness of breath, nausea, vomiting.  He denies history of asthma, COPD, smoking but has been using albuterol inhaler with minimal relief of symptoms.  He has been using multiple medications including multisymptom medication, Mucinex, nasal saline, Flonase, albuterol without improvement of symptoms.  Denies any recent antibiotic use.  He is HIV positive but has had undetectable viral load at last infectious disease visit 7 months ago.  He has had COVID-19 and influenza vaccines.  He is concerned that symptoms are not improving despite conservative treatment measures.   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 No pertinent surgical history.     Home Medications    Prior to Admission medications   Medication Sig Start Date End Date Taking? Authorizing Provider  amoxicillin-clavulanate (AUGMENTIN) 875-125 MG tablet Take 1 tablet by mouth every 12 (twelve) hours. 05/15/21  Yes Dak Szumski K, PA-C  fluticasone (FLONASE) 50 MCG/ACT nasal spray Place 2 sprays into both nostrils daily. 11/13/18  Yes Charlott Rakes, MD  albuterol (VENTOLIN HFA) 108 (90 Base) MCG/ACT inhaler Inhale into the lungs every 6 (six) hours as needed for wheezing or shortness of breath.    [provider]  Alcohol Swabs PADS 1 each by Does not apply route 3 (three) times daily before meals. 11/13/18   Charlott Rakes, MD  amLODipine (NORVASC) 5 MG tablet TAKE 1 TABLET(5 MG) BY MOUTH DAILY 11/13/18   Charlott Rakes, MD  atorvastatin (LIPITOR) 40 MG tablet Take 1 tablet (40 mg total) by mouth daily. 11/13/18   Charlott Rakes, MD  B-D UF III MINI PEN NEEDLES 31G X 5 MM MISC USE TO INJECT AT BEDTIME 06/21/18   Charlott Rakes, MD  bictegravir-emtricitabine-tenofovir AF (BIKTARVY) 50-200-25 MG TABS tablet Take 1 tablet by mouth daily. 10/13/20   Michel Bickers, MD  Blood Glucose Monitoring Suppl [facility] VERIO) w/Device KIT USE AS DIRECTED 03/14/18   Charlott Rakes, MD  cetirizine (ZYRTEC ALLERGY) 10 MG tablet Take 1 tablet (10 mg total) by mouth daily. 08/19/20   Jaynee Eagles, PA-C  diclofenac sodium (  VOLTAREN) 1 % GEL Apply 2 g topically 4 (four) times daily. 09/08/18   Robyn Haber, MD  glipiZIDE (GLUCOTROL) 10 MG tablet TAKE 1 TABLET(10 MG) BY MOUTH TWICE DAILY BEFORE A MEAL 11/13/18   Charlott Rakes, MD  glucose blood (ONETOUCH VERIO) test strip Use as instructed 03/14/18   Charlott Rakes, MD  hydrocortisone (ANUSOL-HC) 2.5 % rectal cream Apply to rectal hemorrhoid BID PRN 08/20/18   Ladell Pier, MD  hydrocortisone cream 1 % APP AA QID FOR 7 DAYS 05/21/19    [provider]  hydrocortisone-pramoxine (ANALPRAM HC) 2.5-1 % rectal cream Place 1 application rectally 3 (three) times daily. 08/14/18   Charlott Rakes, MD  hydrOXYzine (ATARAX/VISTARIL) 25 MG tablet Take 1 tablet (25 mg total) by mouth every 6 (six) hours. 11/22/19   Marney Setting, NP  Insulin Glargine (LANTUS SOLOSTAR) 100 UNIT/ML Solostar Pen Inject 35 Units into the skin at bedtime. 03/29/19   Charlott Rakes, MD  Lancet Devices [facility]) lancets Use as instructed for 3 times daily testing of blood sugar. 12/07/15   Charlott Rakes, MD  LANTUS 100 UNIT/ML injection  08/14/19   [provider]  loperamide (IMODIUM) 2 MG capsule Take 1 capsule (2 mg total) by mouth 2 (two) times daily as needed for diarrhea or loose stools. 11/16/20   Jaynee Eagles, PA-C  montelukast (SINGULAIR) 10 MG tablet Take 1 tablet (10 mg total) by mouth at bedtime. 08/19/20   Jaynee Eagles, PA-C  ondansetron (ZOFRAN-ODT) 8 MG disintegrating tablet Take 1 tablet (8 mg total) by mouth every 8 (eight) hours as needed for nausea or vomiting. 11/16/20   Jaynee Eagles, PA-C  ONETOUCH DELICA LANCETS 35A MISC USE AS DIRECTED 03/14/18   Charlott Rakes, MD  polyethylene glycol (MIRALAX) 17 g packet Take 17 g by mouth daily as needed for moderate constipation or severe constipation. 04/10/20   Scot Jun, FNP  sodium chloride (OCEAN) 0.65 % SOLN nasal spray Place 1 spray into both nostrils as needed for congestion. 02/07/20   Darr, Edison Nasuti, PA-C  triamcinolone cream (KENALOG) 0.1 % Apply 1 application topically 2 (two) times daily. 09/20/20   Volney American, PA-C  valACYclovir (VALTREX) 1000 MG tablet TAKE 1 TABLET(1000 MG) BY MOUTH TWICE DAILY 10/01/18   Charlott Rakes, MD    Family History Family History  Problem Relation Age of Onset   Hypertension Mother    Heart failure Father     Social History Social History   Tobacco Use   Smoking status: Never   Smokeless tobacco: Never   Vaping Use   Vaping Use: Never used  Substance Use Topics   Alcohol use: Yes    Alcohol/week: 21.0 standard drinks    Types: 21 Cans of beer per week   Drug use: No     Allergies   Shrimp [shellfish allergy] and Lisinopril   Review of Systems Review of Systems  Constitutional:  Positive for activity change. Negative for appetite change, fatigue and fever.  HENT:  Positive for congestion, postnasal drip and sinus pressure. Negative for sneezing and sore throat.   Respiratory:  Positive for cough. Negative for shortness of breath.   Cardiovascular:  Negative for chest pain.  Gastrointestinal:  Negative for abdominal pain, diarrhea, nausea and vomiting.  Musculoskeletal:  Negative for arthralgias and myalgias.  Neurological:  Positive for headaches. Negative for dizziness and light-headedness.    Physical Exam Triage Vital Signs ED Triage Vitals [05/15/21 1020]  Enc Vitals Group  BP (!) 144/84     Pulse Rate 88     Resp 18     Temp 98.4 F (36.9 C)     Temp Source Oral     SpO2 96 %     Weight      Height      Head Circumference      Peak Flow      Pain Score 0     Pain Loc      Pain Edu?      Excl. in [place]?    No data found.  Updated Vital Signs BP (!) 144/84 (BP Location: Right Arm)    Pulse 88    Temp 98.4 F (36.9 C) (Oral)    Resp 18    SpO2 96%   Visual Acuity Right Eye Distance:   Left Eye Distance:   Bilateral Distance:    Right Eye Near:   Left Eye Near:    Bilateral Near:     Physical Exam Vitals reviewed.  Constitutional:      General: He is awake.     Appearance: Normal appearance. He is well-developed. He is not ill-appearing.     Comments: Very pleasant male appears stated age in no acute distress sitting comfortably in exam room  HENT:     Head: Normocephalic and atraumatic.     Right Ear: Tympanic membrane, ear canal and external ear normal. Tympanic membrane is not erythematous or bulging.     Left Ear: Tympanic membrane, ear canal  and external ear normal. Tympanic membrane is not erythematous or bulging.     Nose:     Right Sinus: Maxillary sinus tenderness and frontal sinus tenderness present.     Left Sinus: Maxillary sinus tenderness and frontal sinus tenderness present.     Mouth/Throat:     Pharynx: Uvula midline. Posterior oropharyngeal erythema present. No oropharyngeal exudate or uvula swelling.  Cardiovascular:     Rate and Rhythm: Normal rate and regular rhythm.     Heart sounds: Normal heart sounds, S1 normal and S2 normal. No murmur heard. Pulmonary:     Effort: Pulmonary effort is normal. No accessory muscle usage or respiratory distress.     Breath sounds: Normal breath sounds. No stridor. No wheezing, rhonchi or rales.     Comments: Clear to auscultation bilaterally Abdominal:     Palpations: Abdomen is soft.     Tenderness: There is no abdominal tenderness.  Neurological:     Mental Status: He is alert.  Psychiatric:        Behavior: Behavior is cooperative.     UC Treatments / Results  Labs (all labs ordered are listed, but only abnormal results are displayed) Labs Reviewed  SARS CORONAVIRUS 2 (TAT 6-24 HRS)    EKG   Radiology DG Chest 2 View  Result Date: 05/15/2021 CLINICAL DATA:  Cough for 1-2 weeks. EXAM: CHEST - 2 VIEW COMPARISON:  08/19/2020 and older studies. FINDINGS: Cardiac silhouette is normal in size and configuration. Normal mediastinal and hilar contours. Clear lungs.  No pleural effusion or pneumothorax. Skeletal structures are intact. IMPRESSION: No active cardiopulmonary disease. Electronically Signed   By: Lajean Manes M.D.   On: 05/15/2021 11:11    Procedures Procedures (including critical care time)  Medications Ordered in UC Medications - No data to display  Initial Impression / Assessment and Plan / UC Course  I have reviewed the triage vital signs and the nursing notes.  Pertinent labs & imaging results that  were available during my care of the patient  were reviewed by me and considered in my medical decision making (see chart for details).     No indication for viral testing given patient has been symptomatic for several weeks and this would not change management but patient was very concerned about COVID-19 given HIV status so this was sent per his request.  X-ray was obtained given prolonged history of cough which showed no acute abnormalities.  Concern for sinusitis versus sinobronchitis.  Patient was started on antibiotics given he has been symptomatic for several weeks without improvement of symptoms.  He was prescribed Augmentin twice daily.  Recommended over-the-counter medications including Flonase and Mucinex.  He is to rest and drink plenty of fluid.  Discussed alarm symptoms that warrant emergent evaluation.  Strict return precautions given to which he expressed understanding.  Final Clinical Impressions(s) / UC Diagnoses   Final diagnoses:  Acute non-recurrent pansinusitis     Discharge Instructions      Your chest x-ray was normal.  I am going to start you on an antibiotic given you have had symptoms for 2+ weeks.  Take Augmentin twice daily for 7 days.  You can continue the over-the-counter medications including Flonase, Mucinex.  Make sure you rest and drink plenty of fluid.  If your symptoms or not improving within 3 days please follow-up with your primary care provider or our clinic.  If you have any severe symptoms including high fever, worsening cough, shortness of breath, chest discomfort, nausea/vomiting interfering with oral intake you need to go to the emergency room.     ED Prescriptions     Medication Sig Dispense Auth. Provider   amoxicillin-clavulanate (AUGMENTIN) 875-125 MG tablet Take 1 tablet by mouth every 12 (twelve) hours. 14 tablet Hashir Deleeuw, Derry Skill, PA-C      PDMP not reviewed this encounter.   Terrilee Croak, PA-C 05/15/21 1135

## 2021-05-15 NOTE — Discharge Instructions (Signed)
Your chest x-ray was normal.  I am going to start you on an antibiotic given you have had symptoms for 2+ weeks.  Take Augmentin twice daily for 7 days.  You can continue the over-the-counter medications including Flonase, Mucinex.  Make sure you rest and drink plenty of fluid.  If your symptoms or not improving within 3 days please follow-up with your primary care provider or our clinic.  If you have any severe symptoms including high fever, worsening cough, shortness of breath, chest discomfort, nausea/vomiting interfering with oral intake you need to go to the emergency room.

## 2021-05-15 NOTE — ED Triage Notes (Addendum)
Patient c/o productive cough w/ "yellowish to gray" sputum x 2.5 weeks.   Patient denies SOB.   Patient endorses nasal drainage.   Patient endorses being seen at this clinic for similar symptoms on 12/6.   Patient wants X-Ray performed.   Patient has had negative COVID and flu test per patient statement.   Patient has taken OTC cough medication with no relief of symptoms.

## 2021-05-16 ENCOUNTER — Telehealth (HOSPITAL_COMMUNITY): Payer: Self-pay

## 2021-05-16 ENCOUNTER — Ambulatory Visit (HOSPITAL_COMMUNITY): Payer: Medicare HMO

## 2021-05-16 LAB — SARS CORONAVIRUS 2 (TAT 6-24 HRS): SARS Coronavirus 2: POSITIVE — AB

## 2021-05-16 NOTE — Telephone Encounter (Signed)
Called patient to go over his discharge paper and to remind him to treat his symptoms according to how he is feeling at that time. Pt verbalized understanding.

## 2021-09-02 ENCOUNTER — Ambulatory Visit (INDEPENDENT_AMBULATORY_CARE_PROVIDER_SITE_OTHER): Payer: Medicare HMO

## 2021-09-02 ENCOUNTER — Encounter (HOSPITAL_COMMUNITY): Payer: Self-pay | Admitting: Emergency Medicine

## 2021-09-02 ENCOUNTER — Ambulatory Visit (HOSPITAL_COMMUNITY)
Admission: EM | Admit: 2021-09-02 | Discharge: 2021-09-02 | Disposition: A | Discharge status: Home / Self Care | Payer: Medicare HMO | Attending: Nurse Practitioner | Admitting: Nurse Practitioner

## 2021-09-02 DIAGNOSIS — M25512 Pain in left shoulder: Secondary | ICD-10-CM

## 2021-09-02 DIAGNOSIS — M25511 Pain in right shoulder: Secondary | ICD-10-CM | POA: Diagnosis not present

## 2021-09-02 NOTE — ED Triage Notes (Signed)
Pt is present today with left shoulder pain that radiates down his arm . Pt states that his pain started three months ago. Pt states that he may have pulled a muscle. ?

## 2021-09-02 NOTE — Discharge Instructions (Addendum)
Your x-ray is negative for dislocation or fracture.  Your x-ray did show some degenerative changes in the left shoulder. ?Continue Tylenol that you are currently taking. ?Gentle range of motion exercises to help keep the joint mobile. ?Use ice or heat.  Apply ice for pain or swelling, heat for stiffness or spasm.  Apply for 20 minutes, remove for 1 hour, then repeat. ?Follow-up with [facility], you can contact them at [phone] or [facility] [phone]. ?Follow-up as needed. ?

## 2021-09-02 NOTE — ED Provider Notes (Signed)
[facility]    CSN: [id number] Arrival date & time: 09/02/21  4098      History   Chief Complaint Chief Complaint  Patient presents with   Shoulder Pain    HPI Benjamin Hunter is a 61 y.o. male.   The patient is a 61 year old male who presents with left shoulder pain.  Symptoms have been present for the past 3 months.  Patient states that he recalls picking up some trash with the left arm and felt a "pull" in the left shoulder.  Since that times he has had decreased range of motion, with pain radiating down into the upper arm, elbow and forearm.  He also admits to some intermittent numbness and tingling.  The patient is left-hand dominant.  He denies any swelling since the injury occurred.  States that he has been taking Tylenol since his symptoms started with good relief.  He denies any previous injury to that shoulder.  Patient thinks he may have pulled a muscle in his shoulder. Patient does have a history of arthritis.  The history is provided by the patient.   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 No pertinent surgical history.     Home Medications    Prior to Admission medications   Medication Sig Start  Date End Date Taking? Authorizing Provider  albuterol (VENTOLIN HFA) 108 (90 Base) MCG/ACT inhaler Inhale into the lungs every 6 (six) hours as needed for wheezing or shortness of breath.    [provider]  Alcohol Swabs PADS 1 each by Does not apply route 3 (three) times daily before meals. 11/13/18   Hoy Register, MD  amLODipine (NORVASC) 5 MG tablet TAKE 1 TABLET(5 MG) BY MOUTH DAILY 11/13/18   Hoy Register, MD  amoxicillin-clavulanate (AUGMENTIN) 875-125 MG tablet Take 1 tablet by mouth every 12 (twelve) hours. 05/15/21   Raspet, Noberto Retort, PA-C  atorvastatin (LIPITOR) 40 MG tablet Take 1 tablet (40 mg total) by mouth daily. 11/13/18   Hoy Register, MD  B-D UF III MINI PEN NEEDLES 31G X 5 MM MISC USE TO INJECT AT BEDTIME 06/21/18   Hoy Register, MD  bictegravir-emtricitabine-tenofovir AF (BIKTARVY) 50-200-25 MG TABS tablet Take 1 tablet by mouth daily. 10/13/20   [name], MD  Blood Glucose Monitoring Suppl [facility] VERIO) w/Device KIT USE AS DIRECTED 03/14/18   Hoy Register, MD  cetirizine (ZYRTEC ALLERGY) 10 MG tablet Take 1 tablet (10 mg total) by mouth daily. 08/19/20   Wallis Bamberg, PA-C  diclofenac sodium (VOLTAREN) 1 % GEL Apply 2 g topically 4 (four) times daily. 09/08/18   Elvina Sidle, MD  fluticasone (FLONASE) 50 MCG/ACT nasal spray Place 2 sprays into both nostrils daily. 11/13/18  Hoy Register, MD  glipiZIDE (GLUCOTROL) 10 MG tablet TAKE 1 TABLET(10 MG) BY MOUTH TWICE DAILY BEFORE A MEAL 11/13/18   Hoy Register, MD  glucose blood (ONETOUCH VERIO) test strip Use as instructed 03/14/18   Hoy Register, MD  hydrocortisone (ANUSOL-HC) 2.5 % rectal cream Apply to rectal hemorrhoid BID PRN 08/20/18   Marcine Matar, MD  hydrocortisone cream 1 % APP AA QID FOR 7 DAYS 05/21/19   [provider]  hydrocortisone-pramoxine (ANALPRAM HC) 2.5-1 % rectal cream Place 1 application rectally 3 (three) times daily. 08/14/18   Hoy Register, MD  hydrOXYzine  (ATARAX/VISTARIL) 25 MG tablet Take 1 tablet (25 mg total) by mouth every 6 (six) hours. 11/22/19   Coralyn Mark, NP  Insulin Glargine (LANTUS SOLOSTAR) 100 UNIT/ML Solostar Pen Inject 35 Units into the skin at bedtime. 03/29/19   Hoy Register, MD  Lancet Devices [facility]) lancets Use as instructed for 3 times daily testing of blood sugar. 12/07/15   Hoy Register, MD  LANTUS 100 UNIT/ML injection  08/14/19   [provider]  loperamide (IMODIUM) 2 MG capsule Take 1 capsule (2 mg total) by mouth 2 (two) times daily as needed for diarrhea or loose stools. 11/16/20   Wallis Bamberg, PA-C  montelukast (SINGULAIR) 10 MG tablet Take 1 tablet (10 mg total) by mouth at bedtime. 08/19/20   Wallis Bamberg, PA-C  ondansetron (ZOFRAN-ODT) 8 MG disintegrating tablet Take 1 tablet (8 mg total) by mouth every 8 (eight) hours as needed for nausea or vomiting. 11/16/20   Wallis Bamberg, PA-C  ONETOUCH DELICA LANCETS 33G MISC USE AS DIRECTED 03/14/18   Hoy Register, MD  polyethylene glycol (MIRALAX) 17 g packet Take 17 g by mouth daily as needed for moderate constipation or severe constipation. 04/10/20   Bing Neighbors, FNP  sodium chloride (OCEAN) 0.65 % SOLN nasal spray Place 1 spray into both nostrils as needed for congestion. 02/07/20   Darr, Gerilyn Pilgrim, PA-C  triamcinolone cream (KENALOG) 0.1 % Apply 1 application topically 2 (two) times daily. 09/20/20   Particia Nearing, PA-C  valACYclovir (VALTREX) 1000 MG tablet TAKE 1 TABLET(1000 MG) BY MOUTH TWICE DAILY 10/01/18   Hoy Register, MD    Family History Family History  Problem Relation Age of Onset   Hypertension Mother    Heart failure Father     Social History Social History   Tobacco Use   Smoking status: Never   Smokeless tobacco: Never  Vaping Use   Vaping Use: Never used  Substance Use Topics   Alcohol use: Yes    Alcohol/week: 21.0 standard drinks    Types: 21 Cans of beer per week   Drug use: No      Allergies   Shrimp [shellfish allergy] and Lisinopril   Review of Systems Review of Systems  Constitutional: Negative.   Respiratory: Negative.    Cardiovascular: Negative.   Musculoskeletal:        Left shoulder pain   Skin: Negative.   Psychiatric/Behavioral: Negative.      Physical Exam Triage Vital Signs ED Triage Vitals  Enc Vitals Group     BP 09/02/21 1108 137/74     Pulse Rate 09/02/21 1108 68     Resp 09/02/21 1108 17     Temp 09/02/21 1108 97.9 F (36.6 C)     Temp src --      SpO2 09/02/21 1108 97 %     Weight --      Height --  Head Circumference --      Peak Flow --      Pain Score 09/02/21 1106 8     Pain Loc --      Pain Edu? --      Excl. in [place]? --    No data found.  Updated Vital Signs BP 137/74   Pulse 68   Temp 97.9 F (36.6 C)   Resp 17   SpO2 97%   Visual Acuity Right Eye Distance:   Left Eye Distance:   Bilateral Distance:    Right Eye Near:   Left Eye Near:    Bilateral Near:     Physical Exam Vitals reviewed.  Constitutional:      Appearance: Normal appearance.  Musculoskeletal:     Left shoulder: Tenderness present. No swelling, deformity or crepitus. Decreased range of motion. Decreased strength. Normal pulse.     Left upper arm: Tenderness present. No swelling or deformity.     Left elbow: Normal range of motion. Tenderness present.     Left forearm: Tenderness present. No swelling or deformity.     Comments: Limited ROM to the left shoulder. Unable to raise left arm above the level of the shoulder. + Hawkins test, + painful arc, + lift off test. Tenderness to the [facility] joint,supra/infraspinatus of the left shoulder and deltoid region of the left upper arm  Neurological:     Mental Status: He is alert.     UC Treatments / Results  Labs (all labs ordered are listed, but only abnormal results are displayed) Labs Reviewed - No data to display  EKG   Radiology DG Shoulder Left  Result Date:  09/02/2021 CLINICAL DATA:  Shoulder pain for 3 months. EXAM: LEFT SHOULDER - 2+ VIEW COMPARISON:  Chest radiograph 05/15/2021 FINDINGS: Left shoulder is located without a fracture. Mild irregularity and degenerative changes involving the left glenoid. Degenerative changes along the undersurface of the left acromion. Normal alignment at the left [facility] joint. IMPRESSION: Mild degenerative changes in left shoulder. No acute bone abnormality. Electronically Signed   By: Richarda Overlie M.D.   On: 09/02/2021 12:12    Procedures Procedures (including critical care time)  Medications Ordered in UC Medications - No data to display  Initial Impression / Assessment and Plan / UC Course  I have reviewed the triage vital signs and the nursing notes.  Pertinent labs & imaging results that were available during my care of the patient were reviewed by me and considered in my medical decision making (see chart for details).  The patient is a 61 year old male who presents for left shoulder pain.  Symptoms have been present for the past 3 months.  Patient states that he has had shoulder pain since after lifting a bag of trash.  States he felt a "pop" in the left shoulder.  Since that time, he has had decreased range of motion, and pain that radiates down the entire left upper extremity.  He also reports some numbness and tingling.  X-ray was performed, which showed degenerative changes.  Did explain to the patient that for evaluation of soft tissue, nerves, ligaments, or tendons, he would need an MRI.  We will provide the patient with information to [facility] and [facility] care for follow-up.  In the interim, we will have the patient continue Tylenol since it is helping with his pain.  Also we will have the patient begin using ice or heat as needed.  Patient advised to follow-up as needed. Final Clinical  Impressions(s) / UC Diagnoses   Final diagnoses:  Acute pain of right shoulder     Discharge Instructions       Your x-ray is negative for dislocation or fracture.  Your x-ray did show some degenerative changes in the left shoulder. Continue Tylenol that you are currently taking. Gentle range of motion exercises to help keep the joint mobile. Use ice or heat.  Apply ice for pain or swelling, heat for stiffness or spasm.  Apply for 20 minutes, remove for 1 hour, then repeat. Follow-up with [facility], you can contact them at [phone] or [facility] [phone]. Follow-up as needed.     ED Prescriptions   None    PDMP not reviewed this encounter.   Abran Cantor, NP 09/02/21 1228

## 2021-09-14 IMAGING — DX DG CHEST 2V
2 series · 2 of 2 positions shown · non-contrast
Comparison: 08/30/2019

CLINICAL DATA: Cough

EXAM:
CHEST - 2 VIEW

[chest lat]
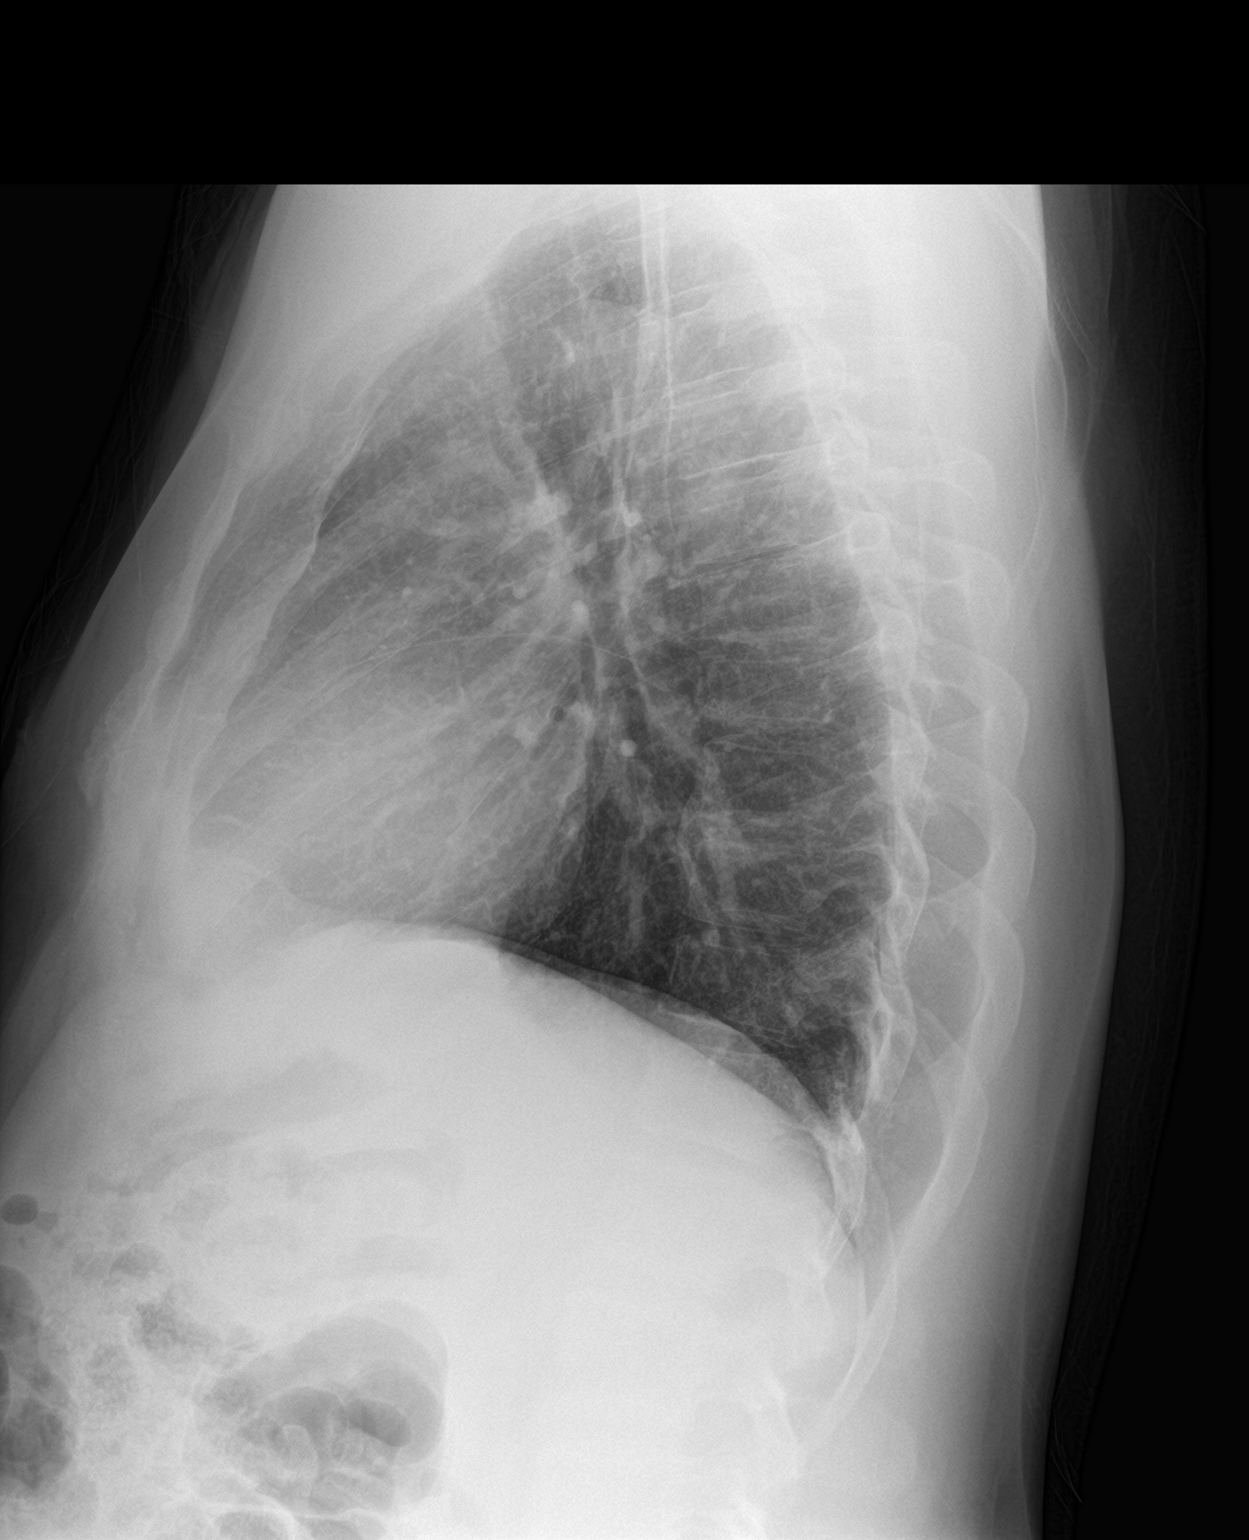

[chest pa]
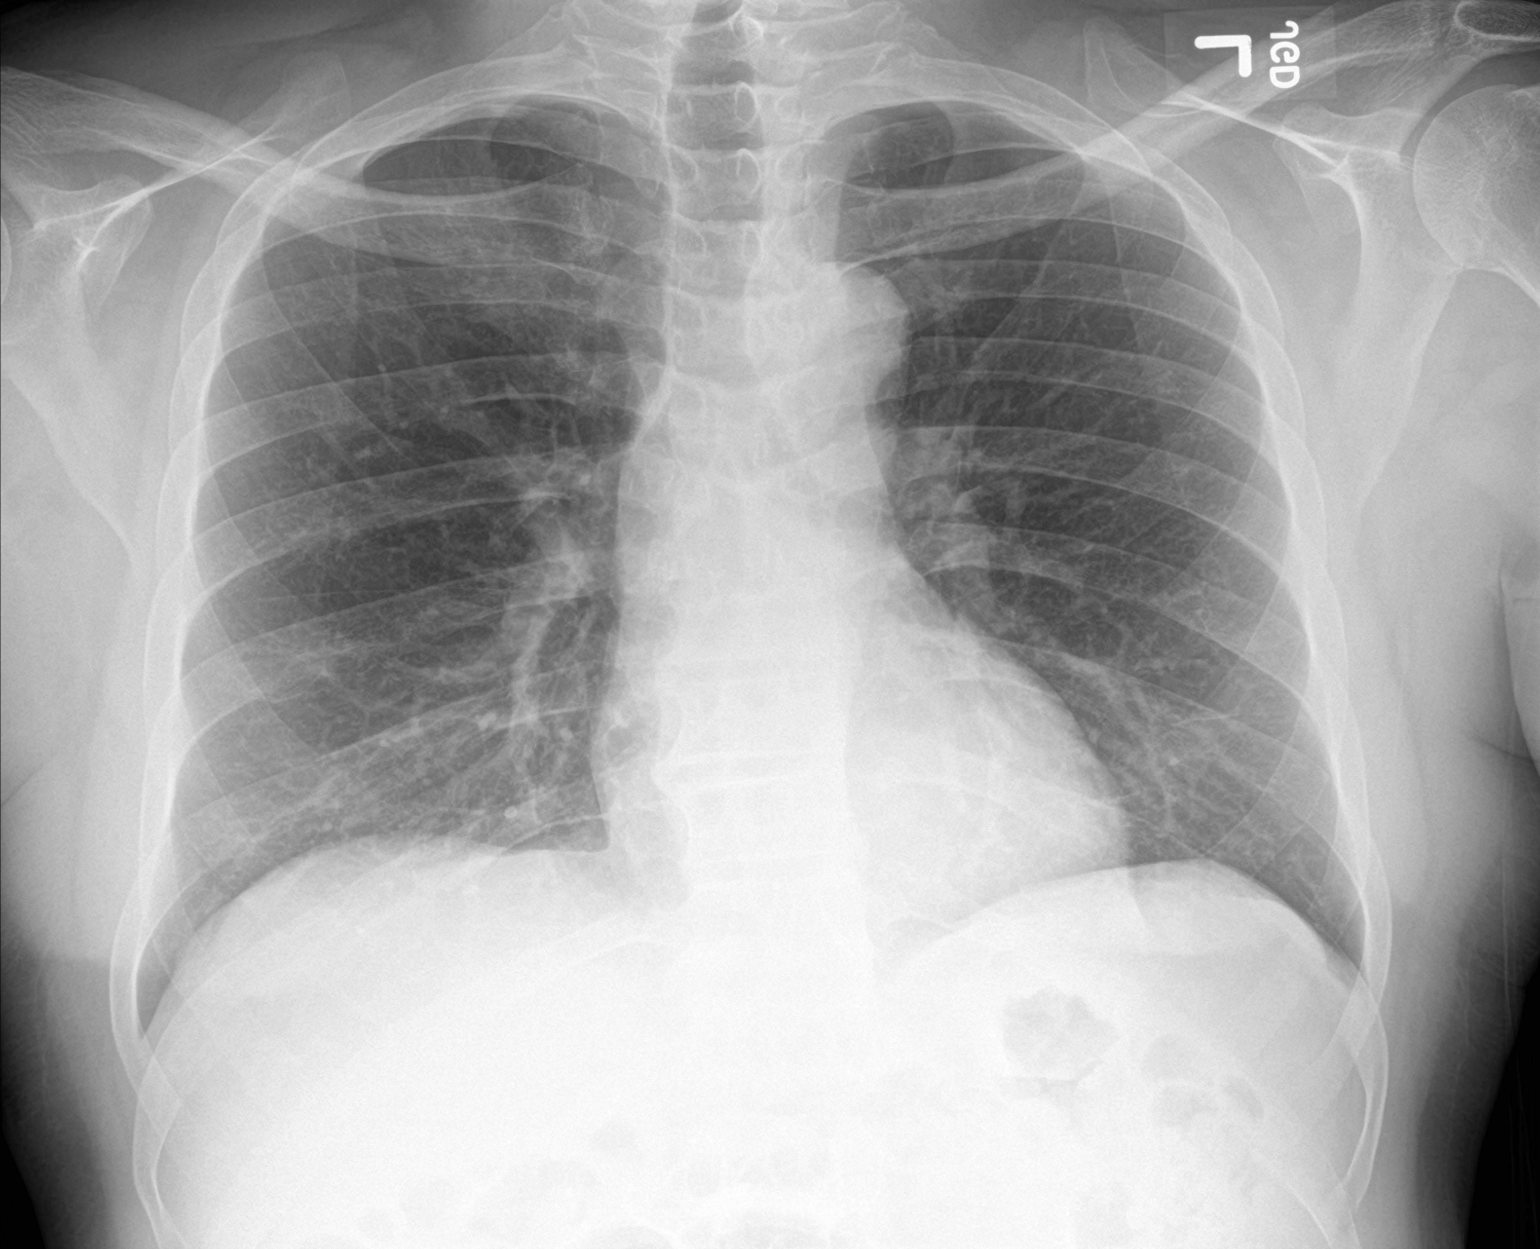

[2 of 2 positions shown; findings below may reference images not displayed]

FINDINGS: The heart size and mediastinal contours are within normal limits. No
focal airspace consolidation, pleural effusion, or pneumothorax. The
visualized skeletal structures are unremarkable.
IMPRESSION: No active cardiopulmonary disease.

## 2021-09-15 ENCOUNTER — Other Ambulatory Visit: Payer: Medicare HMO

## 2021-09-15 ENCOUNTER — Other Ambulatory Visit: Payer: Self-pay

## 2021-09-15 DIAGNOSIS — B20 Human immunodeficiency virus [HIV] disease: Secondary | ICD-10-CM

## 2021-09-18 LAB — HIV-1 RNA QUANT-NO REFLEX-BLD
HIV 1 RNA Quant: NOT DETECTED copies/mL
HIV-1 RNA Quant, Log: NOT DETECTED Log copies/mL

## 2021-09-18 LAB — CBC
HCT: 37.4 % — ABNORMAL LOW (ref 38.5–50.0)
Hemoglobin: 12.3 g/dL — ABNORMAL LOW (ref 13.2–17.1)
MCH: 30.3 pg (ref 27.0–33.0)
MCHC: 32.9 g/dL (ref 32.0–36.0)
MCV: 92.1 fL (ref 80.0–100.0)
MPV: 12.1 fL (ref 7.5–12.5)
Platelets: 172 10*3/uL (ref 140–400)
RBC: 4.06 10*6/uL — ABNORMAL LOW (ref 4.20–5.80)
RDW: 11.8 % (ref 11.0–15.0)
WBC: 3.7 10*3/uL — ABNORMAL LOW (ref 3.8–10.8)

## 2021-09-18 LAB — HELPER T-LYMPH-CD4 (ARMC ONLY)
% CD 4 Pos. Lymph.: 31.6 % (ref 30.8–58.5)
Absolute CD 4 Helper: 506 /uL (ref 359–1519)
Basophils Absolute: 0 10*3/uL (ref 0.0–0.2)
Basos: 1 %
EOS (ABSOLUTE): 0.1 10*3/uL (ref 0.0–0.4)
Eos: 4 %
Hematocrit: 39.1 % (ref 37.5–51.0)
Hemoglobin: 12.7 g/dL — ABNORMAL LOW (ref 13.0–17.7)
Immature Grans (Abs): 0 10*3/uL (ref 0.0–0.1)
Immature Granulocytes: 0 %
Lymphocytes Absolute: 1.6 10*3/uL (ref 0.7–3.1)
Lymphs: 42 %
MCH: 30.7 pg (ref 26.6–33.0)
MCHC: 32.5 g/dL (ref 31.5–35.7)
MCV: 94 fL (ref 79–97)
Monocytes Absolute: 0.3 10*3/uL (ref 0.1–0.9)
Monocytes: 7 %
Neutrophils Absolute: 1.7 10*3/uL (ref 1.4–7.0)
Neutrophils: 46 %
Platelets: 180 10*3/uL (ref 150–450)
RBC: 4.14 x10E6/uL (ref 4.14–5.80)
RDW: 11.6 % (ref 11.6–15.4)
WBC: 3.7 10*3/uL (ref 3.4–10.8)

## 2021-09-18 LAB — COMPREHENSIVE METABOLIC PANEL
AG Ratio: 1.3 (calc) (ref 1.0–2.5)
ALT: 14 U/L (ref 9–46)
AST: 14 U/L (ref 10–35)
Albumin: 3.9 g/dL (ref 3.6–5.1)
Alkaline phosphatase (APISO): 60 U/L (ref 35–144)
BUN/Creatinine Ratio: 14 (calc) (ref 6–22)
BUN: 21 mg/dL (ref 7–25)
CO2: 22 mmol/L (ref 20–32)
Calcium: 9.3 mg/dL (ref 8.6–10.3)
Chloride: 103 mmol/L (ref 98–110)
Creat: 1.49 mg/dL — ABNORMAL HIGH (ref 0.70–1.35)
Globulin: 3 g/dL (calc) (ref 1.9–3.7)
Glucose, Bld: 268 mg/dL — ABNORMAL HIGH (ref 65–99)
Potassium: 4.2 mmol/L (ref 3.5–5.3)
Sodium: 133 mmol/L — ABNORMAL LOW (ref 135–146)
Total Bilirubin: 0.5 mg/dL (ref 0.2–1.2)
Total Protein: 6.9 g/dL (ref 6.1–8.1)

## 2021-09-18 LAB — LIPID PANEL
Cholesterol: 171 mg/dL (ref <200)
HDL: 39 mg/dL — ABNORMAL LOW (ref >=40)
LDL Cholesterol (Calc): 100 mg/dL (calc) — ABNORMAL HIGH
Non-HDL Cholesterol (Calc): 132 mg/dL (calc) — ABNORMAL HIGH (ref <130)
Total CHOL/HDL Ratio: 4.4 (calc) (ref <5.0)
Triglycerides: 205 mg/dL — ABNORMAL HIGH (ref <150)

## 2021-09-18 LAB — RPR: RPR Ser Ql: NONREACTIVE

## 2021-09-20 ENCOUNTER — Other Ambulatory Visit: Payer: Medicare HMO

## 2021-09-21 LAB — T-HELPER CELL (CD4) - (RCID CLINIC ONLY)

## 2021-09-27 ENCOUNTER — Other Ambulatory Visit: Payer: Medicare HMO

## 2021-09-29 ENCOUNTER — Other Ambulatory Visit: Payer: Medicare HMO

## 2021-10-07 ENCOUNTER — Encounter: Payer: Self-pay | Admitting: Internal Medicine

## 2021-10-07 ENCOUNTER — Other Ambulatory Visit: Payer: Self-pay

## 2021-10-07 ENCOUNTER — Ambulatory Visit (INDEPENDENT_AMBULATORY_CARE_PROVIDER_SITE_OTHER): Payer: Medicare HMO | Admitting: Internal Medicine

## 2021-10-07 DIAGNOSIS — B20 Human immunodeficiency virus [HIV] disease: Secondary | ICD-10-CM | POA: Diagnosis not present

## 2021-10-07 MED ORDER — BIKTARVY 50-200-25 MG PO TABS: 1.0000 | ORAL_TABLET | Freq: Every day | ORAL | 11 refills | Status: DC

## 2021-10-07 NOTE — Progress Notes (Signed)
? ?   ? ? ? ? ?Patient Active Problem List  ? Diagnosis Date Noted  ? History of COVID-19 01/22/2020  ?  Priority: High  ? Human immunodeficiency virus (HIV) disease ([place]) 07/17/2008  ?  Priority: High  ? 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  Priority: Medium   ? Seasonal allergies 09/20/2011  ?  Priority: Medium   ? Polyarthritis 09/06/2011  ?  Priority: Medium   ? Type 2 diabetes mellitus with hyperglycemia, with long-term current use of insulin ([place]) 03/16/2009  ?  Priority: Medium   ? Hyperlipidemia 07/17/2008  ?  Priority: Medium   ? Hypertension 07/17/2008  ?  Priority: Medium   ? Right leg pain 05/15/2019  ? Varicose vein of leg 02/24/2016  ? Depression 01/14/2013  ? Rash and nonspecific skin eruption 10/11/2012  ? Recurrent low back pain 10/11/2012  ? Left facial numbness 08/20/2012  ? ? ?Patient's Medications  ?New Prescriptions  ? No medications on file  ?Previous Medications  ? ALBUTEROL (VENTOLIN HFA) 108 (90 BASE) MCG/ACT INHALER    Inhale into the lungs every 6 (six) hours as needed for wheezing or shortness of breath.  ? ALCOHOL SWABS PADS    1 each by Does not apply route 3 (three) times daily before meals.  ? AMLODIPINE (NORVASC) 5 MG TABLET    TAKE 1 TABLET(5 MG) BY MOUTH DAILY  ? AMOXICILLIN-CLAVULANATE (AUGMENTIN) 875-125 MG TABLET    Take 1 tablet by mouth every 12 (twelve) hours.  ? ATORVASTATIN (LIPITOR) 40 MG TABLET    Take 1 tablet (40 mg total) by mouth daily.  ? B-D UF III MINI PEN NEEDLES 31G X 5 MM MISC    USE TO INJECT AT BEDTIME  ? BLOOD GLUCOSE MONITORING SUPPL (ONETOUCH VERIO) W/DEVICE KIT    USE AS DIRECTED  ? CETIRIZINE (ZYRTEC ALLERGY) 10 MG TABLET    Take 1 tablet (10 mg total) by mouth daily.  ? DICLOFENAC SODIUM (VOLTAREN) 1 % GEL    Apply 2 g topically 4 (four) times daily.  ? FENOFIBRATE 160 MG TABLET    Take 160 mg by mouth daily.  ? FLUTICASONE (FLONASE) 50 MCG/ACT NASAL SPRAY    Place 2 sprays into both nostrils daily.  ? GLIPIZIDE (GLUCOTROL) 10 MG TABLET    TAKE 1 TABLET(10 MG)  BY MOUTH TWICE DAILY BEFORE A MEAL  ? GLUCOSE BLOOD (ONETOUCH VERIO) TEST STRIP    Use as instructed  ? HYDROCORTISONE (ANUSOL-HC) 2.5 % RECTAL CREAM    Apply to rectal hemorrhoid BID PRN  ? HYDROCORTISONE CREAM 1 %    APP AA QID FOR 7 DAYS  ? HYDROCORTISONE-PRAMOXINE (ANALPRAM HC) 2.5-1 % RECTAL CREAM    Place 1 application rectally 3 (three) times daily.  ? HYDROXYZINE (ATARAX/VISTARIL) 25 MG TABLET    Take 1 tablet (25 mg total) by mouth every 6 (six) hours.  ? INSULIN GLARGINE (LANTUS SOLOSTAR) 100 UNIT/ML SOLOSTAR PEN    Inject 35 Units into the skin at bedtime.  ? LANCET DEVICES (ACCU-CHEK SOFTCLIX) LANCETS    Use as instructed for 3 times daily testing of blood sugar.  ? LANTUS 100 UNIT/ML INJECTION      ? LOPERAMIDE (IMODIUM) 2 MG CAPSULE    Take 1 capsule (2 mg total) by mouth 2 (two) times daily as needed for diarrhea or loose stools.  ? MONTELUKAST (SINGULAIR) 10 MG TABLET    Take 1 tablet (10 mg total) by mouth at bedtime.  ? ONDANSETRON (ZOFRAN-ODT) 8  MG DISINTEGRATING TABLET    Take 1 tablet (8 mg total) by mouth every 8 (eight) hours as needed for nausea or vomiting.  ? ONETOUCH DELICA LANCETS 16X MISC    USE AS DIRECTED  ? POLYETHYLENE GLYCOL (MIRALAX) 17 G PACKET    Take 17 g by mouth daily as needed for moderate constipation or severe constipation.  ? SODIUM CHLORIDE (OCEAN) 0.65 % SOLN NASAL SPRAY    Place 1 spray into both nostrils as needed for congestion.  ? TRIAMCINOLONE CREAM (KENALOG) 0.1 %    Apply 1 application topically 2 (two) times daily.  ? VALACYCLOVIR (VALTREX) 1000 MG TABLET    TAKE 1 TABLET(1000 MG) BY MOUTH TWICE DAILY  ?Modified Medications  ? Modified Medication Previous Medication  ? BICTEGRAVIR-EMTRICITABINE-TENOFOVIR AF (BIKTARVY) 50-200-25 MG TABS TABLET bictegravir-emtricitabine-tenofovir AF (BIKTARVY) 50-200-25 MG TABS tablet  ?    Take 1 tablet by mouth daily.    Take 1 tablet by mouth daily.  ?Discontinued Medications  ? No medications on file  ? ? ?Subjective: ?Benjamin Hunter  is in for his routine HIV follow-up visit.  He denies any problems obtaining, taking or tolerating his Biktarvy.  He thinks he is only missed 1 dose since his visit last year.  He is feeling well other than his chronic left shoulder pain.  He says that he is doing physical therapy exercises at home. ? ?Review of Systems: ?Review of Systems  ?Constitutional:  Negative for weight loss.  ?Musculoskeletal:  Positive for joint pain.  ?Psychiatric/Behavioral:  Negative for depression. The patient is not nervous/anxious.   ? ?Past Medical History:  ?Diagnosis Date  ? Arthritis   ? Depression   ? Diabetes mellitus   ? HIV (human immunodeficiency virus infection) ([place])   ? Hyperlipidemia   ? Hypertension   ? ? ?Social History  ? ?Tobacco Use  ? Smoking status: Never  ? Smokeless tobacco: Never  ?Vaping Use  ? Vaping Use: Never used  ?Substance Use Topics  ? Alcohol use: Yes  ?  Alcohol/week: 21.0 standard drinks  ?  Types: 21 Cans of beer per week  ? Drug use: No  ? ? ?Family History  ?Problem Relation Age of Onset  ? Hypertension Mother   ? Heart failure Father   ? ? ?Allergies  ?Allergen Reactions  ? Shrimp [Shellfish Allergy] Nausea And Vomiting  ? Lisinopril Rash  ?  Palmar rash ?  ? ? ?Health Maintenance  ?Topic Date Due  ? Zoster Vaccines- Shingrix (1 of 2) Never done  ? COLONOSCOPY (Pts 45-93yr Insurance coverage will need to be confirmed)  Never done  ? OPHTHALMOLOGY EXAM  04/13/2016  ? FOOT EXAM  12/20/2018  ? URINE MICROALBUMIN  04/19/2019  ? HEMOGLOBIN A1C  05/15/2019  ? COVID-19 Vaccine (3 - Moderna risk series) 09/30/2019  ? INFLUENZA VACCINE  12/28/2021  ? TETANUS/TDAP  07/05/2027  ? Hepatitis C Screening  Completed  ? HIV Screening  Completed  ? HPV VACCINES  Aged Out  ? ? ?Objective: ? ?Vitals:  ? 10/07/21 1436  ?BP: 129/82  ?Pulse: 80  ?Temp: 97.9 ?F (36.6 ?C)  ?TempSrc: Temporal  ?SpO2: 100%  ?Weight: 202 lb (91.6 kg)  ? ?Body mass index is 28.17 kg/m?. ? ?Physical Exam ?Constitutional:   ?   Comments: He  is in good spirits as usual.  ?Cardiovascular:  ?   Rate and Rhythm: Normal rate.  ?Pulmonary:  ?   Effort: Pulmonary effort is normal.  ?Psychiatric:     ?  Mood and Affect: Mood normal.  ? ? ?Lab Results ?Lab Results  ?Component Value Date  ? WBC 3.7 (L) 09/15/2021  ? WBC 3.7 09/15/2021  ? HGB 12.3 (L) 09/15/2021  ? HGB 12.7 (L) 09/15/2021  ? HCT 37.4 (L) 09/15/2021  ? HCT 39.1 09/15/2021  ? MCV 92.1 09/15/2021  ? MCV 94 09/15/2021  ? PLT 172 09/15/2021  ? PLT 180 09/15/2021  ?  ?Lab Results  ?Component Value Date  ? CREATININE 1.49 (H) 09/15/2021  ? BUN 21 09/15/2021  ? NA 133 (L) 09/15/2021  ? K 4.2 09/15/2021  ? CL 103 09/15/2021  ? CO2 22 09/15/2021  ?  ?Lab Results  ?Component Value Date  ? ALT 14 09/15/2021  ? AST 14 09/15/2021  ? ALKPHOS 67 04/10/2020  ? BILITOT 0.5 09/15/2021  ?  ?Lab Results  ?Component Value Date  ? CHOL 171 09/15/2021  ? HDL 39 (L) 09/15/2021  ? LDLCALC 100 (H) 09/15/2021  ? TRIG 205 (H) 09/15/2021  ? CHOLHDL 4.4 09/15/2021  ? ?Lab Results  ?Component Value Date  ? LABRPR NON-REACTIVE 09/15/2021  ? ?HIV 1 RNA Quant  ?Date Value  ?09/15/2021 NOT DETECTED copies/mL  ?09/28/2020 Not Detected Copies/mL  ?03/10/2020 31 Copies/mL (H)  ? ?CD4 T Cell Abs (/uL)  ?Date Value  ?09/28/2020 482  ?03/10/2020 496  ?05/01/2019 488  ? ?  ?Problem List Items Addressed This Visit   ? ?  ? High  ? Human immunodeficiency virus (HIV) disease ([place])  ?  His infection remains under excellent, long-term control.  He will continue Biktarvy and follow-up after lab work in 1 year.  Biktarvy does not cause any nephrotoxicity.  Of course, HIV itself can cause nephrotoxicity but guidelines for prevention are simply to have him on effective therapy which he is on. ? ?  ?  ? Relevant Medications  ? bictegravir-emtricitabine-tenofovir AF (BIKTARVY) 50-200-25 MG TABS tablet  ? Other Relevant Orders  ? CBC  ? T-helper cells (CD4) count (not at [facility])  ? Comprehensive metabolic panel  ? Lipid panel  ? RPR  ? HIV-1 RNA  quant-no reflex-bld  ? ? ? ? ?Michel Bickers, MD ?[facility] ?[facility] ?[phone] pager   [phone] cell ?10/07/2021, 2:54 PM ? ?

## 2021-10-07 NOTE — Assessment & Plan Note (Signed)
His infection remains under excellent, long-term control.  He will continue Biktarvy and follow-up after lab work in 1 year.  Biktarvy does not cause any nephrotoxicity.  Of course, HIV itself can cause nephrotoxicity but guidelines for prevention are simply to have him on effective therapy which he is on. ?

## 2021-10-12 ENCOUNTER — Ambulatory Visit: Payer: Medicare HMO | Admitting: Internal Medicine

## 2021-10-13 ENCOUNTER — Telehealth: Payer: Self-pay

## 2021-10-13 NOTE — Telephone Encounter (Signed)
Patient requests address update with the following information: ? ?[address] ?[place], [place] [address] ? ?States he will be moving to this new apartment this week. ? ?Routed to front desk staff. ? ?Binnie Kand, RN  ?

## 2021-10-21 ENCOUNTER — Ambulatory Visit (HOSPITAL_COMMUNITY)
Admission: EM | Admit: 2021-10-21 | Discharge: 2021-10-21 | Disposition: A | Discharge status: Home / Self Care | Payer: Medicare HMO | Attending: Internal Medicine | Admitting: Internal Medicine

## 2021-10-21 ENCOUNTER — Other Ambulatory Visit: Payer: Self-pay

## 2021-10-21 ENCOUNTER — Encounter (HOSPITAL_COMMUNITY): Payer: Self-pay | Admitting: *Deleted

## 2021-10-21 DIAGNOSIS — R051 Acute cough: Secondary | ICD-10-CM | POA: Diagnosis not present

## 2021-10-21 DIAGNOSIS — R0981 Nasal congestion: Secondary | ICD-10-CM | POA: Diagnosis not present

## 2021-10-21 MED ORDER — METHYLPREDNISOLONE SODIUM SUCC 125 MG IJ SOLR
INTRAMUSCULAR | Status: AC
Start: 2021-10-21 — End: ?
  Filled 2021-10-21: qty 2

## 2021-10-21 MED ORDER — METHYLPREDNISOLONE SODIUM SUCC 125 MG IJ SOLR
80.0000 mg | Freq: Once | INTRAMUSCULAR | Status: AC
  Administered 2021-10-21: 80 mg via INTRAMUSCULAR

## 2021-10-21 NOTE — ED Triage Notes (Signed)
Pt reports he has seasonal allergies and has been taking OTC with out relief. Pt has congestion and runny nose.

## 2021-10-21 NOTE — Discharge Instructions (Signed)
You were given a steroid injection today in urgent care.  Please monitor blood sugar closely for the next few days.  Follow-up if symptoms persist or worsen.

## 2021-10-21 NOTE — ED Provider Notes (Signed)
[place]    CSN: [id number] Arrival date & time: 10/21/21  0801      History   Chief Complaint Chief Complaint  Patient presents with   Nasal Congestion    HPI Benjamin Hunter is a 61 y.o. male.   Patient presents with nasal congestion, runny nose, cough that has been present for approximately 4 days.  Patient is attributing symptoms to allergies as he has these symptoms annually at this time of year.  Denies any known fevers or sick contacts.  Patient has taken a COVID test at home that was negative yesterday.  Patient has taken Benadryl, dextromethorphan, Tylenol with no improvement in symptoms.  Denies chest pain, shortness of breath, wheezing, sore throat, ear pain, nausea, vomiting, diarrhea, abdominal pain.  Patient denies history of asthma or COPD.  Patient is requesting a steroid injection as he has received these in the past for the same symptoms with improvement.    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 No pertinent surgical history.     Home Medications    Prior to Admission medications   Medication Sig Start Date End Date Taking? Authorizing Provider  albuterol (VENTOLIN HFA) 108 (90 Base)  MCG/ACT inhaler Inhale into the lungs every 6 (six) hours as needed for wheezing or shortness of breath.    [provider]  Alcohol Swabs PADS 1 each by Does not apply route 3 (three) times daily before meals. 11/13/18   Charlott Rakes, MD  amLODipine (NORVASC) 5 MG tablet TAKE 1 TABLET(5 MG) BY MOUTH DAILY 11/13/18   Charlott Rakes, MD  amoxicillin-clavulanate (AUGMENTIN) 875-125 MG tablet Take 1 tablet by mouth every 12 (twelve) hours. Patient not taking: Reported on 10/07/2021 05/15/21   Raspet, Junie Panning K, PA-C  atorvastatin (LIPITOR) 40 MG tablet Take 1 tablet (40 mg total) by mouth daily. 11/13/18   Charlott Rakes, MD  B-D UF III MINI PEN NEEDLES 31G X 5 MM MISC USE TO INJECT AT BEDTIME 06/21/18   Charlott Rakes, MD  bictegravir-emtricitabine-tenofovir AF (BIKTARVY) 50-200-25 MG TABS tablet Take 1 tablet by mouth daily. 10/07/21   Michel Bickers, MD  Blood Glucose Monitoring Suppl [facility] VERIO) w/Device KIT USE AS DIRECTED 03/14/18   Charlott Rakes, MD  cetirizine (ZYRTEC ALLERGY) 10 MG tablet Take 1 tablet (10 mg total) by mouth daily. 08/19/20   Jaynee Eagles, PA-C  diclofenac sodium (VOLTAREN) 1 % GEL Apply 2 g topically 4 (four) times daily. 09/08/18   Robyn Haber, MD  fenofibrate 160 MG tablet Take 160 mg by mouth daily. 07/13/21   [provider]  fluticasone (FLONASE) 50 MCG/ACT nasal spray Place 2 sprays into both nostrils daily. 11/13/18   Charlott Rakes, MD  glipiZIDE (GLUCOTROL) 10 MG tablet TAKE 1 TABLET(10 MG) BY MOUTH TWICE DAILY BEFORE A MEAL 11/13/18   Charlott Rakes, MD  glucose blood (ONETOUCH VERIO) test strip Use as instructed 03/14/18   Charlott Rakes, MD  hydrocortisone (ANUSOL-HC) 2.5 % rectal cream Apply to rectal hemorrhoid BID PRN 08/20/18   Ladell Pier, MD  hydrocortisone cream 1 % APP AA QID FOR 7 DAYS 05/21/19   [provider]  hydrocortisone-pramoxine (ANALPRAM HC) 2.5-1 % rectal cream Place 1 application rectally 3 (three) times  daily. 08/14/18   Charlott Rakes, MD  hydrOXYzine (ATARAX/VISTARIL) 25 MG tablet Take 1 tablet (25 mg total) by mouth every 6 (six) hours. 11/22/19   Marney Setting, NP  Insulin Glargine (LANTUS SOLOSTAR) 100 UNIT/ML Solostar Pen Inject 35 Units into the skin at bedtime. 03/29/19   Charlott Rakes, MD  Lancet [facility]) lancets Use as instructed for 3 times daily testing of blood sugar. 12/07/15   Charlott Rakes, MD  LANTUS 100 UNIT/ML injection  08/14/19   [provider]  loperamide (IMODIUM) 2 MG capsule Take 1 capsule (2 mg total) by mouth 2 (two) times daily as needed for diarrhea or loose stools. 11/16/20   Jaynee Eagles, PA-C  montelukast (SINGULAIR) 10 MG tablet Take 1 tablet (10 mg total) by mouth at bedtime. 08/19/20   Jaynee Eagles, PA-C  ondansetron (ZOFRAN-ODT) 8 MG disintegrating tablet Take 1 tablet (8 mg total) by mouth every 8 (eight) hours as needed for nausea or vomiting. 11/16/20   Jaynee Eagles, PA-C  ONETOUCH DELICA LANCETS 59D MISC USE AS DIRECTED 03/14/18   Charlott Rakes, MD  polyethylene glycol (MIRALAX) 17 g packet Take 17 g by mouth daily as needed for moderate constipation or severe constipation. 04/10/20   Scot Jun, FNP  sodium chloride (OCEAN) 0.65 % SOLN nasal spray Place 1 spray into both nostrils as needed for congestion. 02/07/20   Darr, Edison Nasuti, PA-C  triamcinolone cream (KENALOG) 0.1 % Apply 1 application topically 2 (two) times daily. 09/20/20   Volney American, PA-C  valACYclovir (VALTREX) 1000 MG tablet TAKE 1 TABLET(1000 MG) BY MOUTH TWICE DAILY 10/01/18   Charlott Rakes, MD    Family History Family History  Problem Relation Age of Onset   Hypertension Mother    Heart failure Father     Social History Social History   Tobacco Use   Smoking status: Never   Smokeless tobacco: Never  Vaping Use   Vaping Use: Never used  Substance Use Topics   Alcohol use: Yes    Alcohol/week: 21.0 standard drinks    Types: 21  Cans of beer per week   Drug use: No     Allergies   Shrimp [shellfish allergy] and Lisinopril   Review of Systems Review of Systems Per HPI  Physical Exam Triage Vital Signs ED Triage Vitals  Enc Vitals Group     BP 10/21/21 0821 120/78     Pulse Rate 10/21/21 0821 77     Resp 10/21/21 0821 18     Temp 10/21/21 0821 98.4 F (36.9 C)     Temp src --      SpO2 10/21/21 0821 100 %     Weight --      Height --      Head Circumference --      Peak Flow --      Pain Score 10/21/21 0820 0     Pain Loc --      Pain  Edu? --      Excl. in [place]? --    No data found.  Updated Vital Signs BP 120/78   Pulse 77   Temp 98.4 F (36.9 C)   Resp 18   SpO2 100%   Visual Acuity Right Eye Distance:   Left Eye Distance:   Bilateral Distance:    Right Eye Near:   Left Eye Near:    Bilateral Near:     Physical Exam Constitutional:      General: He is not in acute distress.    Appearance: Normal appearance. He is not toxic-appearing or diaphoretic.  HENT:     Head: Normocephalic and atraumatic.     Right Ear: Tympanic membrane and ear canal normal.     Left Ear: Tympanic membrane and ear canal normal.     Nose: Congestion present.     Mouth/Throat:     Mouth: Mucous membranes are moist.     Pharynx: No posterior oropharyngeal erythema.  Eyes:     Extraocular Movements: Extraocular movements intact.     Conjunctiva/sclera: Conjunctivae normal.     Pupils: Pupils are equal, round, and reactive to light.  Cardiovascular:     Rate and Rhythm: Normal rate and regular rhythm.     Pulses: Normal pulses.     Heart sounds: Normal heart sounds.  Pulmonary:     Effort: Pulmonary effort is normal. No respiratory distress.     Breath sounds: Normal breath sounds. No stridor. No wheezing, rhonchi or rales.  Abdominal:     General: Abdomen is flat. Bowel sounds are normal.     Palpations: Abdomen is soft.  Musculoskeletal:        General: Normal range of motion.     Cervical  back: Normal range of motion.  Skin:    General: Skin is warm and dry.  Neurological:     General: No focal deficit present.     Mental Status: He is alert and oriented to person, place, and time. Mental status is at baseline.  Psychiatric:        Mood and Affect: Mood normal.        Behavior: Behavior normal.     UC Treatments / Results  Labs (all labs ordered are listed, but only abnormal results are displayed) Labs Reviewed - No data to display  EKG   Radiology No results found.  Procedures Procedures (including critical care time)  Medications Ordered in UC Medications  methylPREDNISolone sodium succinate (SOLU-MEDROL) 125 mg/2 mL injection 80 mg (80 mg Intramuscular Given 10/21/21 0839)    Initial Impression / Assessment and Plan / UC Course  I have reviewed the triage vital signs and the nursing notes.  Pertinent labs & imaging results that were available during my care of the patient were reviewed by me and considered in my medical decision making (see chart for details).     Differential diagnoses include viral upper respiratory infection versus allergic rhinitis.  Patient is requesting IM Solu-Medrol and I do think this is reasonable given that symptoms have been refractory to over-the-counter medications.  Patient does have diabetes but he reports that he has taken steroids in the past and it does not increase his blood sugar very much.  Patient was advised to monitor blood sugar very closely for the next few days following steroid injection.  Discussed supportive care and symptom management patient.  Do not think that viral testing is necessary given that patient had negative COVID test yesterday.  Discussed strict return and ER precautions.  Patient verbalized understanding and was agreeable with plan. Final Clinical Impressions(s) / UC Diagnoses   Final diagnoses:  Nasal congestion  Acute cough     Discharge Instructions      You were given a steroid  injection today in urgent care.  Please monitor blood sugar closely for the next few days.  Follow-up if symptoms persist or worsen.    ED Prescriptions   None    PDMP not reviewed this encounter.   Teodora Medici, [place] 10/21/21 [phone]

## 2021-10-23 ENCOUNTER — Ambulatory Visit (HOSPITAL_COMMUNITY)
Admission: EM | Admit: 2021-10-23 | Discharge: 2021-10-23 | Disposition: A | Discharge status: Home / Self Care | Payer: Medicare HMO | Attending: Internal Medicine | Admitting: Internal Medicine

## 2021-10-23 ENCOUNTER — Ambulatory Visit (INDEPENDENT_AMBULATORY_CARE_PROVIDER_SITE_OTHER): Payer: Medicare HMO

## 2021-10-23 ENCOUNTER — Encounter (HOSPITAL_COMMUNITY): Payer: Self-pay | Admitting: Emergency Medicine

## 2021-10-23 DIAGNOSIS — J40 Bronchitis, not specified as acute or chronic: Secondary | ICD-10-CM

## 2021-10-23 DIAGNOSIS — R051 Acute cough: Secondary | ICD-10-CM

## 2021-10-23 DIAGNOSIS — R059 Cough, unspecified: Secondary | ICD-10-CM

## 2021-10-23 MED ORDER — PROMETHAZINE-DM 6.25-15 MG/5ML PO SYRP: 2.5000 mL | ORAL_SOLUTION | Freq: Four times a day (QID) | ORAL | 0 refills | Status: DC | PRN

## 2021-10-23 MED ORDER — ALBUTEROL SULFATE HFA 108 (90 BASE) MCG/ACT IN AERS
2.0000 | INHALATION_SPRAY | Freq: Once | RESPIRATORY_TRACT | Status: AC
  Administered 2021-10-23: 2 via RESPIRATORY_TRACT

## 2021-10-23 MED ORDER — ACETAMINOPHEN 500 MG PO TABS
1000.0000 mg | ORAL_TABLET | Freq: Four times a day (QID) | ORAL | 0 refills | Status: DC | PRN
Start: 2021-10-23 — End: 2022-07-29

## 2021-10-23 MED ORDER — ALBUTEROL SULFATE HFA 108 (90 BASE) MCG/ACT IN AERS
INHALATION_SPRAY | RESPIRATORY_TRACT | Status: AC
  Filled 2021-10-23: qty 6.7

## 2021-10-23 MED ORDER — ALBUTEROL SULFATE HFA 108 (90 BASE) MCG/ACT IN AERS: 1.0000 | INHALATION_SPRAY | Freq: Four times a day (QID) | RESPIRATORY_TRACT | 0 refills | Status: DC | PRN

## 2021-10-23 MED ORDER — GUAIFENESIN ER 1200 MG PO TB12: 1200.0000 mg | ORAL_TABLET | Freq: Two times a day (BID) | ORAL | 0 refills | Status: DC

## 2021-10-23 NOTE — Discharge Instructions (Addendum)
You were seen in urgent care today for bronchitis.  This is inflammation of your airways that is causing your cough.  Use the albuterol inhaler I have prescribed 2 puffs every 4-6 hours to help open up your airway, decrease inflammation, and allow you to cough up more mucus.  Take guaifenesin 1,200 mg every 12 hours (twice daily) to thin mucous so that you are able to cough it up and get it out of your body easier.  Increase your water intake to help your body thin the mucus to get out of your body easier with the medications.  You may take 2.5 mL of promethazine DM at night for cough every 4 hours.  Only take this cough medicine at night as it can make you very sleepy.  Do not drive or drink alcohol while taking this medication.  You may take Tylenol as needed for pain and comfort.  Chest x-ray was negative for any acute abnormality today.  This is very reassuring!  If you develop any new or worsening symptoms or do not improve in the next 4-5 days, please return.  If your symptoms are severe, please go to the emergency room.  Follow-up with your primary care provider for further evaluation and management of your symptoms as well as ongoing wellness visits.  I hope you feel better!

## 2021-10-23 NOTE — ED Triage Notes (Signed)
Pt reports a cough that has gotten worse the past two days. States he was seen here on 5/25 and since then the cough has worsen and he has pain around ribs from coughing,

## 2021-10-23 NOTE — ED Provider Notes (Addendum)
[place]    CSN: [id number] Arrival date & time: 10/23/21  1012      History   Chief Complaint Chief Complaint  Patient presents with   Cough    HPI Benjamin Hunter is a 61 y.o. male.   Patient presents urgent care for evaluation of cough and nasal congestion that he has had for the last 6 days.  He was seen here on Oct 21, 2021 on day 4 of his symptoms and given a steroid injection as requested.  Patient states that he gets allergies every year around this time of year and his symptoms usually go away with a steroid shot.  Steroid injection helped for 24 hours, then symptoms returned.  He is not a smoker and denies history of asthma and COPD.  He denies nausea, vomiting, abdominal pain, urinary symptoms, headache, ear pain, throat pain, dizziness, and eye itching.  He is diabetic and states that the steroid shot made his sugars increase for 24 hours, but his sugars are back to baseline and are well controlled at this time.  Denies polydipsia, polyuria, and shakiness.  Cough is nonproductive and patient states that he feels like it is "in his throat".  Cough keeps him awake at night.  He has been taking guaifenesin 400 mg as prescribed to loosen mucus and be able to cough it up easier but he complains that this makes his cough worse.  He has also been taking dextromethorphan with his last dose being this morning at around 3 AM.  He has been taking Flonase nasal spray as prescribed with moderate relief of nasal congestion.  Nasal congestion is yellow/white/clear.  Denies fever/chills and sick contacts that he is aware of.  COVID test at home was -6 days ago.  No other aggravating or relieving factors identified at this time.   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 No pertinent surgical history.     Home Medications    Prior to Admission medications   Medication Sig Start Date End Date Taking? Authorizing Provider  acetaminophen (TYLENOL) 500 MG tablet Take 2 tablets (1,000 mg total) by mouth every 6 (six) hours as needed. 10/23/21  Yes Talbot Grumbling, FNP  albuterol (VENTOLIN HFA) 108 (90 Base) MCG/ACT inhaler Inhale 1-2 puffs into the lungs every 6 (six) hours as needed for wheezing or shortness of breath. 10/23/21  Yes Rida Loudin, Stasia Cavalier, FNP  Guaifenesin 1200 MG TB12 Take 1 tablet (1,200 mg total) by mouth in the morning and at bedtime. 10/23/21  Yes Talbot Grumbling, FNP  promethazine-dextromethorphan (PROMETHAZINE-DM) 6.25-15 MG/5ML syrup Take 2.5 mLs by mouth 4 (four) times daily as needed for cough. 10/23/21  Yes Marilene Vath,  Stasia Cavalier, FNP  Alcohol Swabs PADS 1 each by Does not apply route 3 (three) times daily before meals. 11/13/18   Charlott Rakes, MD  amLODipine (NORVASC) 5 MG tablet TAKE 1 TABLET(5 MG) BY MOUTH DAILY 11/13/18   Charlott Rakes, MD  amoxicillin-clavulanate (AUGMENTIN) 875-125 MG tablet Take 1 tablet by mouth every 12 (twelve) hours. Patient not taking: Reported on 10/07/2021 05/15/21   Raspet, Junie Panning K, PA-C  atorvastatin (LIPITOR) 40 MG tablet Take 1 tablet (40 mg total) by mouth daily. 11/13/18   Charlott Rakes, MD  B-D UF III MINI PEN NEEDLES 31G X 5 MM MISC USE  TO INJECT AT BEDTIME 06/21/18   Charlott Rakes, MD  bictegravir-emtricitabine-tenofovir AF (BIKTARVY) 50-200-25 MG TABS tablet Take 1 tablet by mouth daily. 10/07/21   Michel Bickers, MD  Blood Glucose Monitoring Suppl [facility] VERIO) w/Device KIT USE AS DIRECTED 03/14/18   Charlott Rakes, MD  cetirizine (ZYRTEC ALLERGY) 10 MG tablet Take 1 tablet (10 mg total) by mouth daily. 08/19/20   Jaynee Eagles, PA-C  diclofenac sodium (VOLTAREN) 1 % GEL Apply 2 g topically 4 (four) times daily. 09/08/18   Robyn Haber, MD  fenofibrate 160 MG tablet Take 160 mg by mouth daily. 07/13/21   [provider]  fluticasone (FLONASE) 50 MCG/ACT nasal spray Place 2 sprays into both nostrils daily. 11/13/18   Charlott Rakes, MD  glipiZIDE (GLUCOTROL) 10 MG tablet TAKE 1 TABLET(10 MG) BY MOUTH TWICE DAILY BEFORE A MEAL 11/13/18   Charlott Rakes, MD  glucose blood (ONETOUCH VERIO) test strip Use as instructed 03/14/18   Charlott Rakes, MD  hydrocortisone (ANUSOL-HC) 2.5 % rectal cream Apply to rectal hemorrhoid BID PRN 08/20/18   Ladell Pier, MD  hydrocortisone cream 1 % APP AA QID FOR 7 DAYS 05/21/19   [provider]  hydrocortisone-pramoxine (ANALPRAM HC) 2.5-1 % rectal cream Place 1 application rectally 3 (three) times daily. 08/14/18   Charlott Rakes, MD  hydrOXYzine (ATARAX/VISTARIL) 25 MG tablet Take 1 tablet (25 mg total) by mouth every 6 (six) hours. 11/22/19   Marney Setting, NP  Insulin Glargine (LANTUS SOLOSTAR) 100 UNIT/ML Solostar Pen Inject 35 Units into the skin at bedtime. 03/29/19   Charlott Rakes, MD  Lancet Devices [facility]) lancets Use as instructed for 3 times daily testing of blood sugar. 12/07/15   Charlott Rakes, MD  LANTUS 100 UNIT/ML injection  08/14/19   [provider]  loperamide (IMODIUM) 2 MG capsule Take 1 capsule (2 mg total) by mouth 2 (two) times daily as needed for diarrhea or loose stools. 11/16/20   Jaynee Eagles, PA-C  montelukast  (SINGULAIR) 10 MG tablet Take 1 tablet (10 mg total) by mouth at bedtime. 08/19/20   Jaynee Eagles, PA-C  ondansetron (ZOFRAN-ODT) 8 MG disintegrating tablet Take 1 tablet (8 mg total) by mouth every 8 (eight) hours as needed for nausea or vomiting. 11/16/20   Jaynee Eagles, PA-C  ONETOUCH DELICA LANCETS 96P MISC USE AS DIRECTED 03/14/18   Charlott Rakes, MD  polyethylene glycol (MIRALAX) 17 g packet Take 17 g by mouth daily as needed for moderate constipation or severe constipation. 04/10/20   Scot Jun, FNP  sodium chloride (OCEAN) 0.65 % SOLN nasal spray Place 1 spray into both nostrils as needed for congestion. 02/07/20   Darr, Edison Nasuti, PA-C  triamcinolone cream (KENALOG) 0.1 % Apply 1 application topically 2 (two) times daily. 09/20/20   Volney American, PA-C  valACYclovir (VALTREX) 1000 MG tablet TAKE 1 TABLET(1000  MG) BY MOUTH TWICE DAILY 10/01/18   Charlott Rakes, MD    Family History Family History  Problem Relation Age of Onset   Hypertension Mother    Heart failure Father     Social History Social History   Tobacco Use   Smoking status: Never   Smokeless tobacco: Never  Vaping Use   Vaping Use: Never used  Substance Use Topics   Alcohol use: Yes    Alcohol/week: 21.0 standard drinks    Types: 21 Cans of beer per week   Drug use: No     Allergies   Shrimp [shellfish allergy] and Lisinopril   Review of Systems Review of Systems  Respiratory:  Positive for cough.   Per HPI  Physical Exam Triage Vital Signs ED Triage Vitals  Enc Vitals Group     BP 10/23/21 1151 140/85     Pulse Rate 10/23/21 1151 74     Resp 10/23/21 1151 16     Temp 10/23/21 1151 97.9 F (36.6 C)     Temp Source 10/23/21 1151 Oral     SpO2 10/23/21 1151 100 %     Weight 10/23/21 1150 202 lb (91.6 kg)     Height --      Head Circumference --      Peak Flow --      Pain Score 10/23/21 1150 6     Pain Loc --      Pain Edu? --      Excl. in [place]? --    No data found.  Updated  Vital Signs BP 140/85 (BP Location: Right Arm)   Pulse 74   Temp 97.9 F (36.6 C) (Oral)   Resp 16   Wt 202 lb (91.6 kg)   SpO2 100%   BMI 28.17 kg/m   Visual Acuity Right Eye Distance:   Left Eye Distance:   Bilateral Distance:    Right Eye Near:   Left Eye Near:    Bilateral Near:     Physical Exam Vitals and nursing note reviewed.  Constitutional:      General: He is not in acute distress.    Appearance: Normal appearance. He is well-developed. He is not ill-appearing.     Comments: Very pleasant patient sitting comfortably on exam able in no acute distress.   HENT:     Head: Normocephalic and atraumatic.     Right Ear: Hearing, tympanic membrane, ear canal and external ear normal.     Left Ear: Hearing, tympanic membrane, ear canal and external ear normal.     Nose: Rhinorrhea present. No congestion. Rhinorrhea is clear and purulent.     Mouth/Throat:     Lips: Pink.     Mouth: Mucous membranes are moist.     Tongue: No lesions. Tongue does not deviate from midline.     Pharynx: Oropharynx is clear. Uvula midline. Posterior oropharyngeal erythema present. No pharyngeal swelling or uvula swelling.     Tonsils: No tonsillar exudate.     Comments: Mild erythema to posterior oropharynx with small amount of clear postnasal drainage visualized. Airway intact and patent.  Oral mucosa is moist. Eyes:     General: Lids are normal. Vision grossly intact. Gaze aligned appropriately.     Extraocular Movements: Extraocular movements intact.     Conjunctiva/sclera: Conjunctivae normal.     Right eye: Right conjunctiva is not injected.     Left eye: Left conjunctiva is not injected.  Cardiovascular:     Rate and  Rhythm: Normal rate and regular rhythm.     Heart sounds: Normal heart sounds, S1 normal and S2 normal. No murmur heard.   No friction rub. No gallop.  Pulmonary:     Effort: Pulmonary effort is normal. No respiratory distress.     Breath sounds: Normal air entry. No  decreased air movement. Examination of the right-lower field reveals wheezing and rhonchi. Examination of the left-lower field reveals wheezing and rhonchi. Wheezing and rhonchi present. No rales.     Comments: Is intact and patient is able to control his secretions at this time.  Cough is present to physical exam especially with deep inspiration during lung exam.  Wheeze and rhonchi do not clear after cough.  Patient is not in any respiratory distress at this time and is seated in a position of comfort on the exam table. Chest:     Chest wall: No tenderness.  Abdominal:     General: There is no distension.     Palpations: Abdomen is soft.     Tenderness: There is no abdominal tenderness. There is no right CVA tenderness or left CVA tenderness.  Musculoskeletal:        General: No swelling.     Cervical back: Neck supple.     Right lower leg: No edema.     Left lower leg: No edema.  Lymphadenopathy:     Cervical: No cervical adenopathy.  Skin:    General: Skin is warm and dry.     Capillary Refill: Capillary refill takes less than 2 seconds.     Findings: No rash.  Neurological:     General: No focal deficit present.     Mental Status: He is alert and oriented to person, place, and time. Mental status is at baseline.     Gait: Gait is intact.  Psychiatric:        Attention and Perception: Attention and perception normal.        Mood and Affect: Mood normal.        Speech: Speech normal.        Behavior: Behavior normal. Behavior is cooperative.        Thought Content: Thought content normal.        Cognition and Memory: Cognition and memory normal.        Judgment: Judgment normal.     UC Treatments / Results  Labs (all labs ordered are listed, but only abnormal results are displayed) Labs Reviewed - No data to display  EKG   Radiology DG Chest 2 View  Result Date: 10/23/2021 CLINICAL DATA:  Cough worsening over the last 2 days. EXAM: CHEST - 2 VIEW COMPARISON:   05/15/2021. FINDINGS: Cardiac silhouette is normal in size and configuration. Normal mediastinal and hilar contours. Clear lungs.  No pleural effusion or pneumothorax. Skeletal structures are intact. IMPRESSION: No active cardiopulmonary disease. Electronically Signed   By: Lajean Manes M.D.   On: 10/23/2021 12:51    Procedures Procedures (including critical care time)  Medications Ordered in UC Medications  albuterol (VENTOLIN HFA) 108 (90 Base) MCG/ACT inhaler 2 puff (has no administration in time range)    Initial Impression / Assessment and Plan / UC Course  I have reviewed the triage vital signs and the nursing notes.  Pertinent labs & imaging results that were available during my care of the patient were reviewed by me and considered in my medical decision making (see chart for details).  Patient is a 61 year old male presenting to urgent  care today with cough that he has had for 6 days.  He was given a steroid injection 2 days ago at urgent care which helped his symptoms for 24 hours, but cough is persistent and bothersome.  Rhonchi and wheezing heard to right and left lower lung fields with auscultation today.  Patient states he has been vaccinated for pneumonia and denies recent history of lung infection.  Patient is not in any respiratory distress at this time.  Patient's rhonchi and wheezing are likely due to bronchitis; however, plan to get chest x-ray today to rule out acute pneumonia due to length of symptoms and cough with purulent and yellow phlegm.   Chest x-ray shows no acute cardiopulmonary disease.  Symptoms are consistent with viral bronchitis etiology.  Plan to treat with albuterol inhaler for wheezing as needed 2 puffs every 4-6 hours.  First dose of albuterol given in clinic today with improvement of patient's symptoms.  Patient to take guaifenesin 1200 mg every 12 hours and increase water intake to thin mucus.  Patient to take Promethazine DM 2.5 mL at nighttime every 4  hours as needed for cough.  Advised patient not to take Promethazine DM with current dextromethorphan pills as this is the same medication.  Advised patient not to take Promethazine DM during the day or drink alcohol/drive while taking the medication as it will make him sleepy.  He may take Tylenol every 6 hours as needed for pain and comfort.  Steroid given by IM injection 2 days ago is likely still in patient's system at this time.  No clinical indication for daily prednisone burst especially due to patient's type 2 diabetes.  Suspect symptoms will resolve in the next 4 to 5 days with supportive care prescriptions as outlined above.  Counseled patient regarding appropriate use of medications and potential side effects for all medications recommended or prescribed today. Discussed red flag signs and symptoms of worsening condition,when to call the PCP office, return to urgent care, and when to seek higher level of care. Patient verbalizes understanding and agreement with plan. All questions answered. Patient discharged in stable condition.  Final Clinical Impressions(s) / UC Diagnoses   Final diagnoses:  Bronchitis  Acute cough     Discharge Instructions      You were seen in urgent care today for bronchitis.  This is inflammation of your airways that is causing your cough.  Use the albuterol inhaler I have prescribed 2 puffs every 4-6 hours to help open up your airway, decrease inflammation, and allow you to cough up more mucus.  Take guaifenesin 1,200 mg every 12 hours (twice daily) to thin mucous so that you are able to cough it up and get it out of your body easier.  Increase your water intake to help your body thin the mucus to get out of your body easier with the medications.  You may take 2.5 mL of promethazine DM at night for cough every 4 hours.  Only take this cough medicine at night as it can make you very sleepy.  Do not drive or drink alcohol while taking this medication.  You may take  Tylenol as needed for pain and comfort.  Chest x-ray was negative for any acute abnormality today.  This is very reassuring!  If you develop any new or worsening symptoms or do not improve in the next 4-5 days, please return.  If your symptoms are severe, please go to the emergency room.  Follow-up with your primary care provider  for further evaluation and management of your symptoms as well as ongoing wellness visits.  I hope you feel better!     ED Prescriptions     Medication Sig Dispense Auth. Provider   promethazine-dextromethorphan (PROMETHAZINE-DM) 6.25-15 MG/5ML syrup Take 2.5 mLs by mouth 4 (four) times daily as needed for cough. 118 mL Joella Prince M, FNP   Guaifenesin 1200 MG TB12 Take 1 tablet (1,200 mg total) by mouth in the morning and at bedtime. 14 tablet Joella Prince M, FNP   acetaminophen (TYLENOL) 500 MG tablet Take 2 tablets (1,000 mg total) by mouth every 6 (six) hours as needed. 30 tablet Talbot Grumbling, FNP   albuterol (VENTOLIN HFA) 108 (90 Base) MCG/ACT inhaler Inhale 1-2 puffs into the lungs every 6 (six) hours as needed for wheezing or shortness of breath. 8 g Talbot Grumbling, FNP      PDMP not reviewed this encounter.   Talbot Grumbling, FNP 10/23/21 [place], [place], FNP 10/23/21 1320

## 2021-11-04 ENCOUNTER — Other Ambulatory Visit: Payer: Self-pay | Admitting: Internal Medicine

## 2021-11-04 DIAGNOSIS — B20 Human immunodeficiency virus [HIV] disease: Secondary | ICD-10-CM

## 2021-11-06 ENCOUNTER — Ambulatory Visit (HOSPITAL_COMMUNITY)
Admission: EM | Admit: 2021-11-06 | Discharge: 2021-11-06 | Disposition: A | Discharge status: Home / Self Care | Payer: Medicare HMO

## 2021-11-06 ENCOUNTER — Encounter (HOSPITAL_COMMUNITY): Payer: Self-pay

## 2021-11-06 DIAGNOSIS — R195 Other fecal abnormalities: Secondary | ICD-10-CM

## 2021-11-06 DIAGNOSIS — R052 Subacute cough: Secondary | ICD-10-CM

## 2021-11-06 NOTE — Discharge Instructions (Signed)
Continue to drink plenty of water and try to drink at least 64 ounces of water per day.  Go to your primary care appointment on December 03, 2021 to discuss your stool abnormality further.  Physical exam is reassuring today.  If you develop any new or worsening symptoms or do not improve in the next 2 to 3 days, please return.  If your symptoms are severe, please go to the emergency room.  Follow-up with your primary care provider for further evaluation and management of your symptoms as well as ongoing wellness visits.  I hope you feel better!

## 2021-11-06 NOTE — ED Triage Notes (Signed)
Pt states he has been seen here x2 for cough was treated with antibiotics and steroids states he is feeling better but wants someone to listen to his lungs.  Also stated his stool is green for the past 4 days.

## 2021-11-06 NOTE — ED Provider Notes (Signed)
[place]    CSN: [id number] Arrival date & time: 11/06/21  1411      History   Chief Complaint Chief Complaint  Patient presents with  . Cough    HPI Benjamin Hunter is a 61 y.o. male.    Cough  Past Medical History:  Diagnosis Date  . Arthritis   . Depression   . Diabetes mellitus   . HIV (human immunodeficiency virus infection) ([place])   . Hyperlipidemia   . Hypertension     Patient Active Problem List   Diagnosis Date Noted  . History of COVID-19 01/22/2020  . Right leg pain 05/15/2019  . 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 Depression 01/14/2013  . Rash and nonspecific skin eruption 10/11/2012  . Recurrent low back pain 10/11/2012  . Left facial numbness 08/20/2012  . Seasonal allergies 09/20/2011  . Polyarthritis 09/06/2011  . Type 2 diabetes mellitus with hyperglycemia, with long-term current use of insulin ([place]) 03/16/2009  . Human immunodeficiency virus (HIV) disease ([place]) 07/17/2008  . Hyperlipidemia 07/17/2008  . Hypertension 07/17/2008    History reviewed. No pertinent surgical history.     Home Medications    Prior to Admission medications   Medication Sig Start Date End Date Taking? Authorizing Provider  acetaminophen (TYLENOL) 500 MG tablet Take 2 tablets (1,000 mg total) by mouth every 6 (six) hours as needed. 10/23/21   Talbot Grumbling, FNP  albuterol (VENTOLIN HFA) 108 (90 Base) MCG/ACT inhaler Inhale 1-2 puffs into the lungs every 6 (six) hours as needed for wheezing or shortness of breath. 10/23/21   Talbot Grumbling, FNP  Alcohol Swabs PADS 1 each by Does not apply route 3 (three) times daily before meals. 11/13/18   Charlott Rakes, MD  amLODipine (NORVASC) 5 MG tablet TAKE 1 TABLET(5 MG) BY MOUTH DAILY 11/13/18   Charlott Rakes, MD  amoxicillin-clavulanate (AUGMENTIN) 875-125 MG tablet Take 1 tablet by mouth every 12 (twelve) hours. Patient not taking: Reported on 10/07/2021 05/15/21    Raspet, Junie Panning K, PA-C  atorvastatin (LIPITOR) 40 MG tablet Take 1 tablet (40 mg total) by mouth daily. 11/13/18   Charlott Rakes, MD  B-D UF III MINI PEN NEEDLES 31G X 5 MM MISC USE TO INJECT AT BEDTIME 06/21/18   Charlott Rakes, MD  bictegravir-emtricitabine-tenofovir AF (BIKTARVY) 50-200-25 MG TABS tablet Take 1 tablet by mouth daily. 10/07/21   Michel Bickers, MD  Blood Glucose Monitoring Suppl [facility] VERIO) w/Device KIT USE AS DIRECTED 03/14/18   Charlott Rakes, MD  cetirizine (ZYRTEC ALLERGY) 10 MG tablet Take 1 tablet (10 mg total) by mouth daily. 08/19/20   Jaynee Eagles, PA-C  diclofenac sodium (VOLTAREN) 1 % GEL Apply 2 g topically 4 (four) times daily. 09/08/18   Robyn Haber, MD  fenofibrate 160 MG tablet Take 160 mg by mouth daily. 07/13/21   [provider]  fluticasone (FLONASE) 50 MCG/ACT nasal spray Place 2 sprays into both nostrils daily. 11/13/18   Charlott Rakes, MD  glipiZIDE (GLUCOTROL) 10 MG tablet TAKE 1 TABLET(10 MG) BY MOUTH TWICE DAILY BEFORE A MEAL 11/13/18   Charlott Rakes, MD  glucose blood (ONETOUCH VERIO) test strip Use as instructed 03/14/18   Charlott Rakes, MD  Guaifenesin 1200 MG TB12 Take 1 tablet (1,200 mg total) by mouth in the morning and at bedtime. 10/23/21   Talbot Grumbling, FNP  hydrocortisone (ANUSOL-HC) 2.5 % rectal cream Apply to rectal hemorrhoid BID PRN 08/20/18   Ladell Pier, MD  hydrocortisone cream 1 % APP AA QID FOR 7 DAYS 05/21/19   [provider]  hydrocortisone-pramoxine (ANALPRAM HC) 2.5-1 % rectal cream Place 1 application rectally 3 (three) times daily. 08/14/18   Charlott Rakes, MD  hydrOXYzine (ATARAX/VISTARIL) 25 MG tablet Take 1 tablet (25 mg total) by mouth every 6 (six) hours. 11/22/19   Marney Setting, NP  Insulin Glargine (LANTUS SOLOSTAR) 100 UNIT/ML Solostar Pen Inject 35 Units into the skin at bedtime. 03/29/19   Charlott Rakes, MD  Lancet Devices [facility]) lancets Use as instructed  for 3 times daily testing of blood sugar. 12/07/15   Charlott Rakes, MD  LANTUS 100 UNIT/ML injection  08/14/19   [provider]  loperamide (IMODIUM) 2 MG capsule Take 1 capsule (2 mg total) by mouth 2 (two) times daily as needed for diarrhea or loose stools. 11/16/20   Jaynee Eagles, PA-C  montelukast (SINGULAIR) 10 MG tablet Take 1 tablet (10 mg total) by mouth at bedtime. 08/19/20   Jaynee Eagles, PA-C  ondansetron (ZOFRAN-ODT) 8 MG disintegrating tablet Take 1 tablet (8 mg total) by mouth every 8 (eight) hours as needed for nausea or vomiting. 11/16/20   Jaynee Eagles, PA-C  ONETOUCH DELICA LANCETS 72B MISC USE AS DIRECTED 03/14/18   Charlott Rakes, MD  polyethylene glycol (MIRALAX) 17 g packet Take 17 g by mouth daily as needed for moderate constipation or severe constipation. 04/10/20   Scot Jun, FNP  promethazine-dextromethorphan (PROMETHAZINE-DM) 6.25-15 MG/5ML syrup Take 2.5 mLs by mouth 4 (four) times daily as needed for cough. 10/23/21   Talbot Grumbling, FNP  sodium chloride (OCEAN) 0.65 % SOLN nasal spray Place 1 spray into both nostrils as needed for congestion. 02/07/20   Darr, Edison Nasuti, PA-C  triamcinolone cream (KENALOG) 0.1 % Apply 1 application topically 2 (two) times daily. 09/20/20   Volney American, PA-C  valACYclovir (VALTREX) 1000 MG tablet TAKE 1 TABLET(1000 MG) BY MOUTH TWICE DAILY 10/01/18   Charlott Rakes, MD    Family History Family History  Problem Relation Age of Onset  . Hypertension Mother   . Heart failure Father     Social History Social History   Tobacco Use  . Smoking status: Never  . Smokeless tobacco: Never  Vaping Use  . Vaping Use: Never used  Substance Use Topics  . Alcohol use: Yes    Alcohol/week: 21.0 standard drinks of alcohol    Types: 21 Cans of beer per week  . Drug use: No     Allergies   Shrimp [shellfish allergy] and Lisinopril   Review of Systems Review of Systems  Respiratory:  Positive for cough.      Physical Exam Triage Vital Signs ED Triage Vitals [11/06/21 1442]  Enc Vitals Group     BP (!) 136/94     Pulse Rate 79     Resp 16     Temp 97.6 F (36.4 C)     Temp Source Oral     SpO2 100 %     Weight      Height      Head Circumference      Peak Flow      Pain Score 0     Pain Loc      Pain Edu?      Excl. in [place]?    No data found.  Updated Vital Signs BP (!) 136/94 (BP Location: Right Arm)   Pulse 79   Temp 97.6 F (36.4 C) (Oral)  Resp 16   SpO2 100%   Visual Acuity Right Eye Distance:   Left Eye Distance:   Bilateral Distance:    Right Eye Near:   Left Eye Near:    Bilateral Near:     Physical Exam   UC Treatments / Results  Labs (all labs ordered are listed, but only abnormal results are displayed) Labs Reviewed - No data to display  EKG   Radiology No results found.  Procedures Procedures (including critical care time)  Medications Ordered in UC Medications - No data to display  Initial Impression / Assessment and Plan / UC Course  I have reviewed the triage vital signs and the nursing notes.  Pertinent labs & imaging results that were available during my care of the patient were reviewed by me and considered in my medical decision making (see chart for details).     *** Final Clinical Impressions(s) / UC Diagnoses   Final diagnoses:  None   Discharge Instructions   None    ED Prescriptions   None    PDMP not reviewed this encounter.

## 2021-11-17 ENCOUNTER — Ambulatory Visit: Payer: Medicare HMO | Admitting: Internal Medicine

## 2021-11-20 ENCOUNTER — Encounter (HOSPITAL_COMMUNITY): Payer: Self-pay | Admitting: *Deleted

## 2021-11-20 ENCOUNTER — Ambulatory Visit (HOSPITAL_COMMUNITY)
Admission: EM | Admit: 2021-11-20 | Discharge: 2021-11-20 | Disposition: A | Discharge status: Home / Self Care | Payer: Medicare HMO

## 2021-11-20 ENCOUNTER — Other Ambulatory Visit: Payer: Self-pay

## 2021-11-20 DIAGNOSIS — J069 Acute upper respiratory infection, unspecified: Secondary | ICD-10-CM

## 2021-11-20 DIAGNOSIS — J302 Other seasonal allergic rhinitis: Secondary | ICD-10-CM

## 2021-11-20 DIAGNOSIS — R051 Acute cough: Secondary | ICD-10-CM

## 2021-11-23 ENCOUNTER — Ambulatory Visit (INDEPENDENT_AMBULATORY_CARE_PROVIDER_SITE_OTHER): Payer: Medicare HMO | Admitting: Internal Medicine

## 2021-11-23 ENCOUNTER — Encounter: Payer: Self-pay | Admitting: Internal Medicine

## 2021-11-23 ENCOUNTER — Other Ambulatory Visit: Payer: Self-pay

## 2021-11-23 DIAGNOSIS — J40 Bronchitis, not specified as acute or chronic: Secondary | ICD-10-CM | POA: Insufficient documentation

## 2021-11-23 DIAGNOSIS — B20 Human immunodeficiency virus [HIV] disease: Secondary | ICD-10-CM | POA: Diagnosis not present

## 2021-12-10 ENCOUNTER — Encounter: Payer: Self-pay | Admitting: Internal Medicine

## 2022-04-03 ENCOUNTER — Encounter (HOSPITAL_COMMUNITY): Payer: Self-pay | Admitting: Emergency Medicine

## 2022-04-03 ENCOUNTER — Ambulatory Visit (HOSPITAL_COMMUNITY)
Admission: EM | Admit: 2022-04-03 | Discharge: 2022-04-03 | Disposition: A | Discharge status: Home / Self Care | Payer: Medicare HMO | Attending: Family Medicine | Admitting: Family Medicine

## 2022-04-03 ENCOUNTER — Other Ambulatory Visit: Payer: Self-pay

## 2022-04-03 DIAGNOSIS — Z1152 Encounter for screening for COVID-19: Secondary | ICD-10-CM | POA: Diagnosis not present

## 2022-04-03 DIAGNOSIS — J069 Acute upper respiratory infection, unspecified: Secondary | ICD-10-CM | POA: Diagnosis present

## 2022-04-03 DIAGNOSIS — R059 Cough, unspecified: Secondary | ICD-10-CM | POA: Insufficient documentation

## 2022-04-03 NOTE — ED Provider Notes (Signed)
[place]    CSN: [id number] Arrival date & time: 04/03/22  1021      History   Chief Complaint Chief Complaint  Patient presents with   URI    HPI Benjamin Hunter is a 61 y.o. male.    URI  For sore throat, cough, and congestion.  Symptoms began on November 3.  He has not had any fever or chills.  1 time this morning he felt short of breath and used his inhaler.  No nausea or vomiting or diarrhea.  At home he is done to home COVID test this morning and one yesterday.  2 of the tests had a blue control line and then had a pink line very close to where the liquid is supposed started sending, but did not have a pink positive line in the correct place.  The third test had a blue control line and was negative.   He has diabetes and that is fairly well controlled by his report now.  He also takes [employer] for HIV. 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 No pertinent surgical history.     Home Medications    Prior to Admission medications   Medication Sig Start Date End Date Taking? Authorizing Provider  acetaminophen (TYLENOL) 500 MG tablet Take 2 tablets (1,000 mg total) by mouth  every 6 (six) hours as needed. 10/23/21   Talbot Grumbling, FNP  albuterol (VENTOLIN HFA) 108 (90 Base) MCG/ACT inhaler Inhale 1-2 puffs into the lungs every 6 (six) hours as needed for wheezing or shortness of breath. 10/23/21   Talbot Grumbling, FNP  Alcohol Swabs PADS 1 each by Does not apply route 3 (three) times daily before meals. 11/13/18   Charlott Rakes, MD  amLODipine (NORVASC) 5 MG tablet TAKE 1 TABLET(5 MG) BY MOUTH DAILY 11/13/18   Charlott Rakes, MD  atorvastatin (LIPITOR) 40 MG tablet Take 1 tablet (40 mg total) by mouth daily. 11/13/18   Charlott Rakes, MD  B-D UF III MINI PEN NEEDLES 31G X 5 MM MISC USE TO INJECT AT BEDTIME 06/21/18   Charlott Rakes, MD  bictegravir-emtricitabine-tenofovir AF (BIKTARVY) 50-200-25 MG TABS tablet Take 1 tablet by mouth daily. 10/07/21   Michel Bickers, MD  Blood Glucose Monitoring Suppl [facility] VERIO) w/Device KIT USE AS DIRECTED 03/14/18   Charlott Rakes, MD  cetirizine (ZYRTEC ALLERGY) 10 MG tablet Take 1 tablet (10 mg total) by mouth daily. 08/19/20   Jaynee Eagles, PA-C  diclofenac sodium (VOLTAREN) 1 % GEL Apply 2 g topically 4 (four) times daily. 09/08/18   Robyn Haber, MD  fenofibrate 160 MG tablet Take 160 mg by mouth daily. 07/13/21   [provider]  fluticasone (FLONASE) 50 MCG/ACT nasal spray Place 2 sprays into both nostrils daily. 11/13/18   Charlott Rakes, MD  glipiZIDE (GLUCOTROL) 10 MG tablet TAKE 1 TABLET(10 MG) BY MOUTH TWICE DAILY BEFORE A MEAL 11/13/18   Charlott Rakes, MD  glucose blood (ONETOUCH VERIO) test strip Use as instructed 03/14/18   Charlott Rakes, MD  Guaifenesin 1200 MG TB12 Take 1 tablet (1,200 mg total) by mouth in the morning and at bedtime. 10/23/21   Talbot Grumbling, FNP  hydrocortisone (ANUSOL-HC) 2.5 % rectal cream Apply to rectal hemorrhoid BID PRN 08/20/18   Ladell Pier, MD  hydrocortisone cream 1 % APP AA QID FOR 7 DAYS 05/21/19   [provider]   hydrocortisone-pramoxine (ANALPRAM HC) 2.5-1 % rectal cream Place 1 application rectally 3 (three) times daily. 08/14/18   Charlott Rakes, MD  Insulin Glargine (LANTUS SOLOSTAR) 100 UNIT/ML Solostar Pen Inject 35 Units into the skin at bedtime. 03/29/19   Charlott Rakes, MD  Lancet Devices [facility]) lancets Use as instructed for 3 times daily testing of blood sugar. 12/07/15   Charlott Rakes, MD  LANTUS 100 UNIT/ML injection  08/14/19   [provider]  loperamide (IMODIUM) 2 MG capsule Take 1 capsule (2 mg total) by mouth 2 (two) times daily as needed for diarrhea or loose stools. 11/16/20   Jaynee Eagles, PA-C  ONETOUCH DELICA LANCETS 44W MISC USE AS DIRECTED 03/14/18   Charlott Rakes, MD  polyethylene glycol (MIRALAX) 17 g packet Take 17 g by mouth daily as needed for moderate constipation or severe constipation. 04/10/20   Scot Jun, FNP  sodium chloride (OCEAN) 0.65 % SOLN nasal spray Place 1 spray into both nostrils as needed for congestion. 02/07/20   Darr, Edison Nasuti, PA-C  triamcinolone cream (KENALOG) 0.1 % Apply 1 application topically 2 (two) times daily. 09/20/20   Volney American, PA-C  valACYclovir (VALTREX) 1000 MG tablet TAKE 1 TABLET(1000 MG) BY MOUTH TWICE DAILY 10/01/18   Charlott Rakes, MD    Family History Family History  Problem Relation Age of Onset   Hypertension Mother    Heart failure Father     Social History Social History   Tobacco Use   Smoking status: Never   Smokeless tobacco: Never  Vaping Use   Vaping Use: Never used  Substance Use Topics   Alcohol use: Yes    Alcohol/week: 21.0 standard drinks of alcohol    Types: 21 Cans of beer per week   Drug use: No     Allergies   Shrimp [shellfish allergy] and Lisinopril   Review of Systems Review of Systems   Physical Exam Triage Vital Signs ED Triage Vitals  Enc Vitals Group     BP 04/03/22 1114 137/76     Pulse Rate 04/03/22 1114 88     Resp 04/03/22 1114 20      Temp 04/03/22 1114 98.3 F (36.8 C)     Temp Source 04/03/22 1114 Oral     SpO2 04/03/22 1114 96 %     Weight --      Height --      Head Circumference --      Peak Flow --      Pain Score 04/03/22 1112 0     Pain Loc --      Pain Edu? --      Excl. in [place]? --    No data found.  Updated Vital Signs BP 137/76 (BP Location: Right Arm)   Pulse  88   Temp 98.3 F (36.8 C) (Oral)   Resp 20   SpO2 96%   Visual Acuity Right Eye Distance:   Left Eye Distance:   Bilateral Distance:    Right Eye Near:   Left Eye Near:    Bilateral Near:     Physical Exam Vitals reviewed.  Constitutional:      General: He is not in acute distress.    Appearance: He is not toxic-appearing.  HENT:     Right Ear: Tympanic membrane and ear canal normal.     Left Ear: Tympanic membrane and ear canal normal.     Nose: Nose normal.     Mouth/Throat:     Mouth: Mucous membranes are moist.     Comments: There is very mild erythema of the tonsillar pillars, and some clear mucus in the oropharynx. Eyes:     Extraocular Movements: Extraocular movements intact.     Conjunctiva/sclera: Conjunctivae normal.     Pupils: Pupils are equal, round, and reactive to light.  Cardiovascular:     Rate and Rhythm: Normal rate and regular rhythm.     Heart sounds: No murmur heard. Pulmonary:     Effort: No respiratory distress.     Breath sounds: No stridor. No wheezing, rhonchi or rales.  Musculoskeletal:     Cervical back: Neck supple.  Lymphadenopathy:     Cervical: No cervical adenopathy.  Skin:    Capillary Refill: Capillary refill takes less than 2 seconds.     Coloration: Skin is not jaundiced or pale.  Neurological:     General: No focal deficit present.     Mental Status: He is alert and oriented to person, place, and time.  Psychiatric:        Behavior: Behavior normal.      UC Treatments / Results  Labs (all labs ordered are listed, but only abnormal results are displayed) Labs Reviewed   SARS CORONAVIRUS 2 (TAT 6-24 HRS)    EKG   Radiology No results found.  Procedures Procedures (including critical care time)  Medications Ordered in UC Medications - No data to display  Initial Impression / Assessment and Plan / UC Course  I have reviewed the triage vital signs and the nursing notes.  Pertinent labs & imaging results that were available during my care of the patient were reviewed by me and considered in my medical decision making (see chart for details).        I think it is best for [place] to do a PCR COVID test today.  And will result by tomorrow and we will notify him if positive.  If positive, he is a candidate for molnupiravir.  He cannot take the Paxlovid due to interactions with his Biktarvy. Final Clinical Impressions(s) / UC Diagnoses   Final diagnoses:  Viral URI with cough     Discharge Instructions       You have been swabbed for COVID, and the test will result in the next 24 hours. Our staff will call you if positive. If the test is positive, you should quarantine for 5 days from the start of your symptoms      ED Prescriptions   None    PDMP not reviewed this encounter.   Barrett Henle, MD 04/03/22 1141

## 2022-04-03 NOTE — ED Triage Notes (Signed)
2 days ago, started having sinus congestion.  Throat is sore, started yesterday.  Slight right ear pain, had a headache yesterday.   Took aspirin today, has used inhaler and flonase and otc cough medicine.  Patient is frequently clearing throat.  Patient states his concerns are for sinus infection, bronchitis, and and/or covid.    Patient did get covid and flu vaccines last week.   Patient reports doing home covid tests, but demonstrated results and questionable results.

## 2022-04-03 NOTE — Discharge Instructions (Signed)
  You have been swabbed for COVID, and the test will result in the next 24 hours. Our staff will call you if positive. If the test is positive, you should quarantine for 5 days from the start of your symptoms.  

## 2022-04-04 LAB — SARS CORONAVIRUS 2 (TAT 6-24 HRS): SARS Coronavirus 2: NEGATIVE

## 2022-07-07 ENCOUNTER — Encounter (HOSPITAL_COMMUNITY): Payer: Self-pay | Admitting: Emergency Medicine

## 2022-07-07 ENCOUNTER — Ambulatory Visit (HOSPITAL_COMMUNITY)
Admission: EM | Admit: 2022-07-07 | Discharge: 2022-07-07 | Disposition: A | Discharge status: Home / Self Care | Payer: Medicare HMO | Attending: Family Medicine | Admitting: Family Medicine

## 2022-07-07 ENCOUNTER — Other Ambulatory Visit: Payer: Self-pay

## 2022-07-07 DIAGNOSIS — J0100 Acute maxillary sinusitis, unspecified: Secondary | ICD-10-CM

## 2022-07-07 MED ORDER — AMOXICILLIN 500 MG PO CAPS: 500.0000 mg | ORAL_CAPSULE | Freq: Three times a day (TID) | ORAL | 0 refills | Status: AC

## 2022-07-07 NOTE — ED Provider Notes (Cosign Needed)
[place]    CSN: [id number] Arrival date & time: 07/07/22  3825      History   Chief Complaint Chief Complaint  Patient presents with   URI    HPI Benjamin Hunter is a 62 y.o. male.    URI Presenting symptoms: congestion   Presenting symptoms: no cough, no fatigue, no fever and no sore throat    Started feeling ill about 1.5 weeks ago with congestion, headache Taking over-the-counter medications and Flonase Denies cough, sore throat, fever, chills, chest pain, shortness of breath No known sick contacts Asking about steroid shot which he states he got last year  Past Medical History:  Diagnosis Date   Arthritis    Depression    Diabetes mellitus    HIV (human immunodeficiency virus infection) ([place])    Hyperlipidemia    Hypertension     Patient Active Problem List   Diagnosis Date Noted   Bronchitis 11/23/2021   History of COVID-19 01/22/2020   Right leg pain 05/15/2019   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 No pertinent surgical history.     Home Medications    Prior to Admission medications   Medication Sig Start Date End Date Taking? Authorizing Provider  amoxicillin (AMOXIL) 500 MG capsule Take 1 capsule (500 mg total) by mouth 3 (three) times daily for 5 days. 07/07/22 07/12/22 Yes Zola Button, MD  acetaminophen (TYLENOL) 500 MG tablet Take 2 tablets (1,000 mg total) by mouth every 6 (six) hours as needed. 10/23/21   Talbot Grumbling, FNP  albuterol (VENTOLIN HFA) 108 (90  Base) MCG/ACT inhaler Inhale 1-2 puffs into the lungs every 6 (six) hours as needed for wheezing or shortness of breath. 10/23/21   Talbot Grumbling, FNP  Alcohol Swabs PADS 1 each by Does not apply route 3 (three) times daily before meals. 11/13/18   Charlott Rakes, MD  amLODipine (NORVASC) 5 MG tablet TAKE 1 TABLET(5 MG) BY MOUTH DAILY 11/13/18   Charlott Rakes, MD  atorvastatin (LIPITOR) 40 MG tablet Take 1 tablet (40 mg total) by mouth daily. 11/13/18   Charlott Rakes, MD  B-D UF III MINI PEN NEEDLES 31G X 5 MM MISC USE TO INJECT AT BEDTIME 06/21/18   Charlott Rakes, MD  bictegravir-emtricitabine-tenofovir AF (BIKTARVY) 50-200-25 MG TABS tablet Take 1 tablet by mouth daily. 10/07/21   Michel Bickers, MD  Blood Glucose Monitoring Suppl [facility] VERIO) w/Device KIT USE AS DIRECTED 03/14/18   Charlott Rakes, MD  cetirizine (ZYRTEC ALLERGY) 10 MG tablet Take 1 tablet (10 mg total) by mouth daily. 08/19/20   Jaynee Eagles, PA-C  diclofenac sodium (VOLTAREN) 1 % GEL Apply 2 g topically 4 (four) times daily. 09/08/18   Robyn Haber, MD  fenofibrate 160 MG tablet Take 160 mg by mouth daily. 07/13/21   [provider]  fluticasone (FLONASE) 50 MCG/ACT nasal spray Place 2 sprays into both nostrils daily. 11/13/18   Charlott Rakes, MD  glipiZIDE (GLUCOTROL) 10 MG tablet TAKE 1 TABLET(10 MG) BY MOUTH TWICE DAILY BEFORE A MEAL 11/13/18   Newlin,  Enobong, MD  glucose blood (ONETOUCH VERIO) test strip Use as instructed 03/14/18   Charlott Rakes, MD  Guaifenesin 1200 MG TB12 Take 1 tablet (1,200 mg total) by mouth in the morning and at bedtime. 10/23/21   Talbot Grumbling, FNP  hydrocortisone (ANUSOL-HC) 2.5 % rectal cream Apply to rectal hemorrhoid BID PRN 08/20/18   Ladell Pier, MD  hydrocortisone cream 1 % APP AA QID FOR 7 DAYS 05/21/19   [provider]  hydrocortisone-pramoxine (ANALPRAM HC) 2.5-1 % rectal cream Place 1 application rectally 3 (three) times daily. 08/14/18    Charlott Rakes, MD  Insulin Glargine (LANTUS SOLOSTAR) 100 UNIT/ML Solostar Pen Inject 35 Units into the skin at bedtime. 03/29/19   Charlott Rakes, MD  Lancet Devices [facility]) lancets Use as instructed for 3 times daily testing of blood sugar. 12/07/15   Charlott Rakes, MD  LANTUS 100 UNIT/ML injection  08/14/19   [provider]  loperamide (IMODIUM) 2 MG capsule Take 1 capsule (2 mg total) by mouth 2 (two) times daily as needed for diarrhea or loose stools. 11/16/20   Jaynee Eagles, PA-C  ONETOUCH DELICA LANCETS 54U MISC USE AS DIRECTED 03/14/18   Charlott Rakes, MD  polyethylene glycol (MIRALAX) 17 g packet Take 17 g by mouth daily as needed for moderate constipation or severe constipation. 04/10/20   Scot Jun, NP  sodium chloride (OCEAN) 0.65 % SOLN nasal spray Place 1 spray into both nostrils as needed for congestion. 02/07/20   Darr, Edison Nasuti, PA-C  triamcinolone cream (KENALOG) 0.1 % Apply 1 application topically 2 (two) times daily. 09/20/20   Volney American, PA-C  valACYclovir (VALTREX) 1000 MG tablet TAKE 1 TABLET(1000 MG) BY MOUTH TWICE DAILY 10/01/18   Charlott Rakes, MD    Family History Family History  Problem Relation Age of Onset   Hypertension Mother    Heart failure Father     Social History Social History   Tobacco Use   Smoking status: Never   Smokeless tobacco: Never  Vaping Use   Vaping Use: Never used  Substance Use Topics   Alcohol use: Yes    Alcohol/week: 21.0 standard drinks of alcohol    Types: 21 Cans of beer per week   Drug use: No     Allergies   Shrimp [shellfish allergy] and Lisinopril   Review of Systems Review of Systems  Constitutional:  Negative for chills, fatigue and fever.  HENT:  Positive for congestion. Negative for sore throat.   Respiratory:  Negative for cough and shortness of breath.   Cardiovascular:  Negative for chest pain.     Physical Exam Triage Vital Signs ED Triage Vitals  Enc  Vitals Group     BP 07/07/22 1029 134/68     Pulse Rate 07/07/22 1029 80     Resp 07/07/22 1029 18     Temp 07/07/22 1029 97.9 F (36.6 C)     Temp Source 07/07/22 1029 Oral     SpO2 07/07/22 1029 96 %     Weight --      Height --      Head Circumference --      Peak Flow --      Pain Score 07/07/22 1027 0     Pain Loc --      Pain Edu? --      Excl. in [place]? --    No data found.  Updated Vital Signs BP 134/68 (BP Location: Right Arm)   Pulse  80   Temp 97.9 F (36.6 C) (Oral)   Resp 18   SpO2 96%   Visual Acuity Right Eye Distance:   Left Eye Distance:   Bilateral Distance:    Right Eye Near:   Left Eye Near:    Bilateral Near:     Physical Exam Constitutional:      General: He is not in acute distress. HENT:     Head: Normocephalic and atraumatic.     Comments: Bilateral maxillary sinus tenderness    Nose: Congestion present.     Mouth/Throat:     Mouth: Mucous membranes are moist.     Pharynx: Oropharynx is clear. No oropharyngeal exudate or posterior oropharyngeal erythema.  Cardiovascular:     Rate and Rhythm: Normal rate and regular rhythm.  Pulmonary:     Effort: Pulmonary effort is normal. No respiratory distress.     Breath sounds: Normal breath sounds.  Musculoskeletal:     Cervical back: Neck supple.  Lymphadenopathy:     Cervical: No cervical adenopathy.  Neurological:     Mental Status: He is alert.      UC Treatments / Results  Labs (all labs ordered are listed, but only abnormal results are displayed) Labs Reviewed - No data to display  EKG   Radiology No results found.  Procedures Procedures (including critical care time)  Medications Ordered in UC Medications - No data to display  Initial Impression / Assessment and Plan / UC Course  I have reviewed the triage vital signs and the nursing notes.  Pertinent labs & imaging results that were available during my care of the patient were reviewed by me and considered in my  medical decision making (see chart for details).    62 year old male with history of T2DM, HIV on Biktarvy, seasonal allergies presenting with upper respiratory symptoms primarily congestion ongoing for approximately 1.5 weeks.  Could possibly be related to allergic rhinitis or sinusitis but given length of symptoms I think reasonable to treat with antibiotics.  Discussed with patient that steroids would unlikely be beneficial.  Final Clinical Impressions(s) / UC Diagnoses   Final diagnoses:  Acute non-recurrent maxillary sinusitis     Discharge Instructions      Take antibiotics as prescribed. Continue using your nasal sprays.     ED Prescriptions     Medication Sig Dispense Auth. Provider   amoxicillin (AMOXIL) 500 MG capsule Take 1 capsule (500 mg total) by mouth 3 (three) times daily for 5 days. 15 capsule Zola Button, MD      PDMP not reviewed this encounter.   Zola Button, MD 07/07/22 1141

## 2022-07-07 NOTE — Discharge Instructions (Addendum)
Take antibiotics as prescribed. Continue using your nasal sprays.

## 2022-07-07 NOTE — ED Triage Notes (Addendum)
Symptoms started a week ago.  Reports head/nasal stuffiness.   Patient refers to a seasonal illness, reports having "same symptoms last year,,,steroid shot really helped" Otc cold and fever medicine.  Reports using flonase

## 2022-07-22 ENCOUNTER — Encounter (HOSPITAL_COMMUNITY): Payer: Self-pay

## 2022-07-22 ENCOUNTER — Ambulatory Visit (HOSPITAL_COMMUNITY)
Admission: EM | Admit: 2022-07-22 | Discharge: 2022-07-22 | Disposition: A | Discharge status: Home / Self Care | Payer: Medicare HMO

## 2022-07-22 DIAGNOSIS — J0100 Acute maxillary sinusitis, unspecified: Secondary | ICD-10-CM | POA: Diagnosis not present

## 2022-07-22 DIAGNOSIS — R051 Acute cough: Secondary | ICD-10-CM

## 2022-07-22 NOTE — ED Provider Notes (Signed)
[place]    CSN: [id number] Arrival date & time: 07/22/22  1118      History   Chief Complaint Chief Complaint  Patient presents with   Cough    HPI Benjamin Hunter is a 62 y.o. male.   Reports he would like a chest x-ray and auscultation to see if we hear rattling or if he has developed pneumonia. Ribcage has been sore d/t cough, has been taking OTC could medication and cough syrup that are helping. Scratch in throat continues. Was seen on 07/07/21 and dx w/ sinusitis, he held off on starting amoxicillin but started them Thursday (yesterday).    Hx of HIV, HTN, HLD and DM.   The history is provided by the patient.  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 No pertinent surgical history.     Home Medications    Prior to Admission medications   Medication Sig Start Date End Date Taking? Authorizing Provider  acetaminophen (TYLENOL) 500 MG tablet Take 2 tablets (1,000 mg total) by mouth every 6 (six) hours  as needed. 10/23/21   Talbot Grumbling, FNP  albuterol (VENTOLIN HFA) 108 (90 Base) MCG/ACT inhaler Inhale 1-2 puffs into the lungs every 6 (six) hours as needed for wheezing or shortness of breath. 10/23/21   Talbot Grumbling, FNP  Alcohol Swabs PADS 1 each by Does not apply route 3 (three) times daily before meals. 11/13/18   Charlott Rakes, MD  amLODipine (NORVASC) 5 MG tablet TAKE 1 TABLET(5 MG) BY MOUTH DAILY 11/13/18   Charlott Rakes, MD  atorvastatin (LIPITOR) 40 MG tablet Take 1 tablet (40 mg total) by mouth daily. 11/13/18   Charlott Rakes, MD  B-D UF III MINI PEN NEEDLES 31G X 5 MM MISC USE TO INJECT AT BEDTIME 06/21/18   Charlott Rakes, MD  bictegravir-emtricitabine-tenofovir AF (BIKTARVY) 50-200-25 MG TABS tablet Take 1 tablet by mouth daily. 10/07/21   Michel Bickers, MD  Blood Glucose Monitoring Suppl [facility] VERIO) w/Device KIT USE AS DIRECTED 03/14/18   Charlott Rakes, MD  cetirizine (ZYRTEC ALLERGY) 10 MG tablet Take 1 tablet (10 mg total) by mouth daily. 08/19/20   Jaynee Eagles, PA-C  diclofenac sodium (VOLTAREN) 1 % GEL Apply 2 g topically 4 (four) times daily. 09/08/18   Robyn Haber, MD  fenofibrate 160 MG tablet Take 160 mg by mouth daily. 07/13/21   [provider]  fluticasone (FLONASE) 50 MCG/ACT nasal spray Place 2 sprays into both nostrils daily. 11/13/18  Newlin, Enobong, MD  glipiZIDE (GLUCOTROL) 10 MG tablet TAKE 1 TABLET(10 MG) BY MOUTH TWICE DAILY BEFORE A MEAL 11/13/18   Charlott Rakes, MD  glucose blood (ONETOUCH VERIO) test strip Use as instructed 03/14/18   Charlott Rakes, MD  Guaifenesin 1200 MG TB12 Take 1 tablet (1,200 mg total) by mouth in the morning and at bedtime. 10/23/21   Talbot Grumbling, FNP  hydrocortisone (ANUSOL-HC) 2.5 % rectal cream Apply to rectal hemorrhoid BID PRN 08/20/18   Ladell Pier, MD  hydrocortisone cream 1 % APP AA QID FOR 7 DAYS 05/21/19   [provider]  hydrocortisone-pramoxine (ANALPRAM HC)  2.5-1 % rectal cream Place 1 application rectally 3 (three) times daily. 08/14/18   Charlott Rakes, MD  Insulin Glargine (LANTUS SOLOSTAR) 100 UNIT/ML Solostar Pen Inject 35 Units into the skin at bedtime. 03/29/19   Charlott Rakes, MD  Lancet Devices [facility]) lancets Use as instructed for 3 times daily testing of blood sugar. 12/07/15   Charlott Rakes, MD  LANTUS 100 UNIT/ML injection  08/14/19   [provider]  loperamide (IMODIUM) 2 MG capsule Take 1 capsule (2 mg total) by mouth 2 (two) times daily as needed for diarrhea or loose stools. 11/16/20   Jaynee Eagles, PA-C  ONETOUCH DELICA LANCETS [id number] MISC USE AS DIRECTED 03/14/18   Charlott Rakes, MD  polyethylene glycol (MIRALAX) 17 g packet Take 17 g by mouth daily as needed for moderate constipation or severe constipation. 04/10/20   Scot Jun, NP  sodium chloride (OCEAN) 0.65 % SOLN nasal spray Place 1 spray into both nostrils as needed for congestion. 02/07/20   Darr, Edison Nasuti, PA-C  triamcinolone cream (KENALOG) 0.1 % Apply 1 application topically 2 (two) times daily. 09/20/20   Volney American, PA-C  valACYclovir (VALTREX) 1000 MG tablet TAKE 1 TABLET(1000 MG) BY MOUTH TWICE DAILY 10/01/18   Charlott Rakes, MD    Family History Family History  Problem Relation Age of Onset   Hypertension Mother    Heart failure Father     Social History Social History   Tobacco Use   Smoking status: Never   Smokeless tobacco: Never  Vaping Use   Vaping Use: Never used  Substance Use Topics   Alcohol use: Yes    Alcohol/week: 21.0 standard drinks of alcohol    Types: 21 Cans of beer per week   Drug use: No     Allergies   Shrimp [shellfish allergy] and Lisinopril   Review of Systems Review of Systems  Constitutional:  Negative for chills and fever.  HENT:  Positive for postnasal drip, rhinorrhea, sinus pressure, sinus pain and sore throat. Negative for ear pain, trouble swallowing and voice change.    Eyes:  Negative for pain and visual disturbance.  Respiratory:  Positive for cough. Negative for chest tightness, shortness of breath and wheezing.   Cardiovascular:  Negative for chest pain and palpitations.  Gastrointestinal:  Negative for vomiting.  Skin:  Negative for color change and rash.  All other systems reviewed and are negative.    Physical Exam Triage Vital Signs ED Triage Vitals  Enc Vitals Group     BP 07/22/22 1211 (!) 154/93     Pulse Rate 07/22/22 1211 87     Resp 07/22/22 1211 16     Temp 07/22/22 1211 97.8 F (36.6 C)     Temp Source 07/22/22 1211 Oral     SpO2 07/22/22 1211 97 %     Weight --  Height --      Head Circumference --      Peak Flow --      Pain Score 07/22/22 1212 0     Pain Loc --      Pain Edu? --      Excl. in [place]? --    No data found.  Updated Vital Signs BP (!) 154/93 (BP Location: Left Arm)   Pulse 87   Temp 97.8 F (36.6 C) (Oral)   Resp 16   SpO2 97%   Visual Acuity Right Eye Distance:   Left Eye Distance:   Bilateral Distance:    Right Eye Near:   Left Eye Near:    Bilateral Near:     Physical Exam Vitals and nursing note reviewed.  Constitutional:      General: He is not in acute distress.    Appearance: Normal appearance. He is well-developed. He is not ill-appearing, toxic-appearing or diaphoretic.     Comments: Pleasant 62 year old male who appears stated age.  HENT:     Head: Normocephalic and atraumatic.     Right Ear: External ear normal.     Left Ear: External ear normal.     Nose: Congestion and rhinorrhea present.     Mouth/Throat:     Mouth: Mucous membranes are moist.     Pharynx: Posterior oropharyngeal erythema present.  Eyes:     Conjunctiva/sclera: Conjunctivae normal.  Cardiovascular:     Rate and Rhythm: Normal rate and regular rhythm.     Heart sounds: S1 normal and S2 normal. No murmur heard. Pulmonary:     Effort: Pulmonary effort is normal. No respiratory distress.     Breath  sounds: Normal breath sounds. No stridor. No wheezing or rales.     Comments: Lungs vesicular bilaterally. Musculoskeletal:        General: No swelling.     Cervical back: Neck supple.  Lymphadenopathy:     Head:     Right side of head: Submandibular adenopathy present.     Left side of head: Submandibular adenopathy present.     Cervical: Cervical adenopathy present.  Skin:    General: Skin is warm and dry.     Capillary Refill: Capillary refill takes less than 2 seconds.  Neurological:     Mental Status: He is alert.  Psychiatric:        Mood and Affect: Mood normal.        Behavior: Behavior is cooperative.      UC Treatments / Results  Labs (all labs ordered are listed, but only abnormal results are displayed) Labs Reviewed - No data to display  EKG   Radiology No results found.  Procedures Procedures (including critical care time)  Medications Ordered in UC Medications - No data to display  Initial Impression / Assessment and Plan / UC Course  I have reviewed the triage vital signs and the nursing notes.  Pertinent labs & imaging results that were available during my care of the patient were reviewed by me and considered in my medical decision making (see chart for details).  Vitals and triage note reviewed, patient is hemodynamically stable.  Is hypertensive on exam, with history of hypertension.  Lungs vesicular bilaterally, 97% oxygen saturation on room air.  Discussed that his sore throat is likely due to his cough.  Advised to complete antibiotics as prescribed.  Symptomatic cough relief and sore throat relief discussed.  Return precautions discussed, patient verbalized understanding.     Final  Clinical Impressions(s) / UC Diagnoses   Final diagnoses:  Acute non-recurrent maxillary sinusitis  Acute cough     Discharge Instructions      Your lungs were clear to auscultation and your vital signs were stable.  Your symptoms appear consistent with your  sinusitis.  You started your amoxicillin for this infection yesterday, please take all antibiotics as prescribed until finished.  Today we discussed symptomatic relief for your cough.  You can do Mucinex as advised, please drink at least 64 ounces of water with this daily.  You can do cough drops, sleeping with a humidifier, and tea with honey for sore throat relief.  Please return to clinic if symptoms persist after antibiotics are completed.     ED Prescriptions   None    I have reviewed the PDMP during this encounter.   Eryx Zane, [place], [place] 07/22/22 1252

## 2022-07-22 NOTE — Discharge Instructions (Signed)
Your lungs were clear to auscultation and your vital signs were stable.  Your symptoms appear consistent with your sinusitis.  You started your amoxicillin for this infection yesterday, please take all antibiotics as prescribed until finished.  Today we discussed symptomatic relief for your cough.  You can do Mucinex as advised, please drink at least 64 ounces of water with this daily.  You can do cough drops, sleeping with a humidifier, and tea with honey for sore throat relief.  Please return to clinic if symptoms persist after antibiotics are completed.

## 2022-07-22 NOTE — ED Triage Notes (Signed)
Patient reports that he was seen in this UC a week ago and states that he started his antibiotic yesterday. Patient states he his having a scratchy throat and a productive cough with clear sputum.  Patient states he wants to make sure he does not have pneumonia.

## 2022-07-25 ENCOUNTER — Ambulatory Visit (HOSPITAL_COMMUNITY)
Admission: EM | Admit: 2022-07-25 | Discharge: 2022-07-25 | Disposition: A | Discharge status: Home / Self Care | Payer: Medicare HMO | Attending: Internal Medicine | Admitting: Internal Medicine

## 2022-07-25 ENCOUNTER — Ambulatory Visit (INDEPENDENT_AMBULATORY_CARE_PROVIDER_SITE_OTHER): Payer: Medicare HMO

## 2022-07-25 ENCOUNTER — Encounter (HOSPITAL_COMMUNITY): Payer: Self-pay | Admitting: Emergency Medicine

## 2022-07-25 DIAGNOSIS — R059 Cough, unspecified: Secondary | ICD-10-CM

## 2022-07-25 DIAGNOSIS — J209 Acute bronchitis, unspecified: Secondary | ICD-10-CM

## 2022-07-25 DIAGNOSIS — R053 Chronic cough: Secondary | ICD-10-CM

## 2022-07-25 MED ORDER — PREDNISONE 20 MG PO TABS: 40.0000 mg | ORAL_TABLET | Freq: Every day | ORAL | 0 refills | Status: DC

## 2022-07-25 MED ORDER — ALBUTEROL SULFATE HFA 108 (90 BASE) MCG/ACT IN AERS: 1.0000 | INHALATION_SPRAY | Freq: Four times a day (QID) | RESPIRATORY_TRACT | 0 refills | Status: AC | PRN

## 2022-07-25 MED ORDER — BENZONATATE 100 MG PO CAPS: 100.0000 mg | ORAL_CAPSULE | Freq: Three times a day (TID) | ORAL | 0 refills | Status: DC

## 2022-07-25 NOTE — Discharge Instructions (Signed)
You have bronchitis which is inflammation of the upper airways in your lungs due to a virus. The following medicines will help with your symptoms.   - Take steroid pills sent to pharmacy as directed. Do not take any other NSAID containing medications such as ibuprofen or naproxen/Aleve while taking prednisone. - You may use albuterol inhaler 1 to 2 puffs every 4-6 hours as needed for cough, shortness of breath, and wheezing. - Take cough medicines as directed. - Continue using over the counter medicines as needed as directed. Plain mucinex (guaifenesin) over the counter may further help breakup mucus and help with symptoms.   If you develop any new or worsening symptoms or do not improve in the next 2 to 3 days, please return.  If your symptoms are severe, please go to the emergency room.  Follow-up with your primary care provider for further evaluation and management of your symptoms as well as ongoing wellness visits.  I hope you feel better!

## 2022-07-25 NOTE — ED Provider Notes (Signed)
[place]    CSN: [id number] Arrival date & time: 07/25/22  0801      History   Chief Complaint Chief Complaint  Patient presents with   Cough    HPI Benjamin Hunter is a 62 y.o. male.   Patient presents to urgent care for evaluation of ongoing cough and nasal congestion that he states has been present since early February 2024 (approximately 4 weeks).  He was treated with amoxicillin prescribed on July 07, 2022 for likely acute bacterial sinusitis.  He states he did not start the antibiotic until July 21, 2022 and is currently on his last day of the 3 times daily 500 mg amoxicillin he was given at his February 8 visit.  He states this is helped somewhat with his nasal congestion, however he remains coughing at nighttime and has a sensation of inability to expectorate mucus "stuck in his throat".  Not experiencing any fever, chills, nausea, vomiting, headache, dizziness, shortness of breath, chest pain, or heart palpitations.  No recent known sick contacts or recent exposures to new viral illnesses.  He is not a smoker and denies drug use.  Denies history of chronic respiratory problems.  He has been using over-the-counter cough syrup at nighttime and states that this has been helping significantly with his symptoms.  He was seen on July 22, 2022 as well and advised to purchase Mucinex to help with expectoration.  He states he has been using this and it has not helped very much. Drinking plenty of water. No leg swelling or orthopnea.  Tolerating food and fluids well without nausea, vomiting, diarrhea, or abdominal pain.  He had leftover albuterol inhaler from a previous bronchitis illness and has been using this with some relief of his cough.   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 No pertinent surgical history.     Home Medications    Prior to Admission medications   Medication Sig Start Date End Date Taking? Authorizing Provider  benzonatate (TESSALON) 100 MG capsule Take 1 capsule (100 mg total) by mouth every 8 (eight) hours. 07/25/22  Yes Talbot Grumbling, FNP  predniSONE (DELTASONE) 20 MG tablet Take 2 tablets (40 mg total) by mouth daily for 5 days. 07/25/22 07/30/22 Yes Talbot Grumbling, FNP  acetaminophen (TYLENOL) 500 MG tablet Take 2 tablets (1,000 mg total) by mouth every 6 (six) hours as needed. 10/23/21   Talbot Grumbling, FNP  albuterol (VENTOLIN HFA) 108 (90 Base) MCG/ACT inhaler Inhale 1-2 puffs into the lungs every 6 (six) hours as needed for wheezing or shortness of breath. 07/25/22   Talbot Grumbling, FNP  Alcohol Swabs PADS 1 each  by Does not apply route 3 (three) times daily before meals. 11/13/18   Charlott Rakes, MD  amLODipine (NORVASC) 5 MG tablet TAKE 1 TABLET(5 MG) BY MOUTH DAILY 11/13/18   Charlott Rakes, MD  atorvastatin (LIPITOR) 40 MG tablet Take 1 tablet (40 mg total) by mouth daily. 11/13/18   Charlott Rakes, MD  B-D UF III MINI PEN NEEDLES 31G X 5 MM MISC USE TO INJECT AT BEDTIME 06/21/18   Charlott Rakes, MD  bictegravir-emtricitabine-tenofovir AF (BIKTARVY) 50-200-25 MG TABS tablet Take 1 tablet by mouth daily.  10/07/21   Michel Bickers, MD  Blood Glucose Monitoring Suppl [facility] VERIO) w/Device KIT USE AS DIRECTED 03/14/18   Charlott Rakes, MD  cetirizine (ZYRTEC ALLERGY) 10 MG tablet Take 1 tablet (10 mg total) by mouth daily. 08/19/20   Jaynee Eagles, PA-C  diclofenac sodium (VOLTAREN) 1 % GEL Apply 2 g topically 4 (four) times daily. 09/08/18   Robyn Haber, MD  fenofibrate 160 MG tablet Take 160 mg by mouth daily. 07/13/21   [provider]  fluticasone (FLONASE) 50 MCG/ACT nasal spray Place 2 sprays into both nostrils daily. 11/13/18   Charlott Rakes, MD  glipiZIDE (GLUCOTROL) 10 MG tablet TAKE 1 TABLET(10 MG) BY MOUTH TWICE DAILY BEFORE A MEAL 11/13/18   Charlott Rakes, MD  glucose blood (ONETOUCH VERIO) test strip Use as instructed 03/14/18   Charlott Rakes, MD  Guaifenesin 1200 MG TB12 Take 1 tablet (1,200 mg total) by mouth in the morning and at bedtime. 10/23/21   Talbot Grumbling, FNP  hydrocortisone (ANUSOL-HC) 2.5 % rectal cream Apply to rectal hemorrhoid BID PRN 08/20/18   Ladell Pier, MD  hydrocortisone cream 1 % APP AA QID FOR 7 DAYS 05/21/19   [provider]  hydrocortisone-pramoxine (ANALPRAM HC) 2.5-1 % rectal cream Place 1 application rectally 3 (three) times daily. 08/14/18   Charlott Rakes, MD  Insulin Glargine (LANTUS SOLOSTAR) 100 UNIT/ML Solostar Pen Inject 35 Units into the skin at bedtime. 03/29/19   Charlott Rakes, MD  Lancet Devices [facility]) lancets Use as instructed for 3 times daily testing of blood sugar. 12/07/15   Charlott Rakes, MD  LANTUS 100 UNIT/ML injection  08/14/19   [provider]  loperamide (IMODIUM) 2 MG capsule Take 1 capsule (2 mg total) by mouth 2 (two) times daily as needed for diarrhea or loose stools. 11/16/20   Jaynee Eagles, PA-C  ONETOUCH DELICA LANCETS [id number] MISC USE AS DIRECTED 03/14/18   Charlott Rakes, MD  polyethylene glycol (MIRALAX) 17 g packet Take 17 g by mouth daily as needed for moderate  constipation or severe constipation. 04/10/20   Scot Jun, NP  sodium chloride (OCEAN) 0.65 % SOLN nasal spray Place 1 spray into both nostrils as needed for congestion. 02/07/20   Darr, Edison Nasuti, PA-C  triamcinolone cream (KENALOG) 0.1 % Apply 1 application topically 2 (two) times daily. 09/20/20   Volney American, PA-C  valACYclovir (VALTREX) 1000 MG tablet TAKE 1 TABLET(1000 MG) BY MOUTH TWICE DAILY 10/01/18   Charlott Rakes, MD    Family History Family History  Problem Relation Age of Onset   Hypertension Mother    Heart failure Father     Social History Social History   Tobacco Use   Smoking status: Never   Smokeless tobacco: Never  Vaping Use   Vaping Use: Never used  Substance Use Topics   Alcohol use: Yes    Alcohol/week: 21.0 standard drinks of alcohol    Types: 21 Cans  of beer per week   Drug use: No     Allergies   Shrimp [shellfish allergy] and Lisinopril   Review of Systems Review of Systems  Respiratory:  Positive for cough.   Per HPI   Physical Exam Triage Vital Signs ED Triage Vitals [07/25/22 0818]  Enc Vitals Group     BP 136/79     Pulse Rate 78     Resp 15     Temp 98.1 F (36.7 C)     Temp Source Oral     SpO2 98 %     Weight      Height      Head Circumference      Peak Flow      Pain Score 0     Pain Loc      Pain Edu?      Excl. in [place]?    No data found.  Updated Vital Signs BP 136/79 (BP Location: Right Arm)   Pulse 78   Temp 98.1 F (36.7 C) (Oral)   Resp 15   SpO2 98%   Visual Acuity Right Eye Distance:   Left Eye Distance:   Bilateral Distance:    Right Eye Near:   Left Eye Near:    Bilateral Near:     Physical Exam Vitals and nursing note reviewed.  Constitutional:      Appearance: He is not ill-appearing or toxic-appearing.  HENT:     Head: Normocephalic and atraumatic.     Right Ear: Hearing, tympanic membrane, ear canal and external ear normal.     Left Ear: Hearing, tympanic membrane, ear  canal and external ear normal.     Nose: Nose normal.     Mouth/Throat:     Lips: Pink.     Mouth: Mucous membranes are moist. No injury.     Tongue: No lesions. Tongue does not deviate from midline.     Palate: No mass and lesions.     Pharynx: Oropharynx is clear. Uvula midline. No pharyngeal swelling, oropharyngeal exudate, posterior oropharyngeal erythema or uvula swelling.     Tonsils: No tonsillar exudate or tonsillar abscesses.  Eyes:     General: Lids are normal. Vision grossly intact. Gaze aligned appropriately.     Extraocular Movements: Extraocular movements intact.     Conjunctiva/sclera: Conjunctivae normal.  Cardiovascular:     Rate and Rhythm: Normal rate and regular rhythm.     Heart sounds: Normal heart sounds, S1 normal and S2 normal.  Pulmonary:     Effort: Pulmonary effort is normal. No tachypnea, accessory muscle usage, prolonged expiration or respiratory distress.     Breath sounds: Normal air entry. No decreased air movement. Examination of the right-lower field reveals rhonchi. Rhonchi present. No wheezing or rales.     Comments: Rhonchi present to the right lower lung field without any other adventitious sounds heard to the rest of the chest bilaterally. Musculoskeletal:     Cervical back: Neck supple.  Skin:    General: Skin is warm and dry.     Capillary Refill: Capillary refill takes less than 2 seconds.     Findings: No rash.  Neurological:     General: No focal deficit present.     Mental Status: He is alert and oriented to person, place, and time. Mental status is at baseline.     Cranial Nerves: No dysarthria or facial asymmetry.  Psychiatric:        Mood and Affect: Mood normal.  Speech: Speech normal.        Behavior: Behavior normal.        Thought Content: Thought content normal.        Judgment: Judgment normal.      UC Treatments / Results  Labs (all labs ordered are listed, but only abnormal results are displayed) Labs Reviewed -  No data to display  EKG   Radiology DG Chest 2 View  Result Date: 07/25/2022 CLINICAL DATA:  Persistent cough.  Sinus drainage. EXAM: CHEST - 2 VIEW COMPARISON:  10/23/2021 FINDINGS: Heart size is normal. Mediastinal shadows are normal. The lungs are clear. No bronchial thickening. No infiltrate, mass, effusion or collapse. Pulmonary vascularity is normal. No bony abnormality. IMPRESSION: Normal chest. Electronically Signed   By: Nelson Chimes M.D.   On: 07/25/2022 09:06    Procedures Procedures (including critical care time)  Medications Ordered in UC Medications - No data to display  Initial Impression / Assessment and Plan / UC Course  I have reviewed the triage vital signs and the nursing notes.  Pertinent labs & imaging results that were available during my care of the patient were reviewed by me and considered in my medical decision making (see chart for details).  1.  Acute bronchitis, persistent cough for 3 weeks or longer Presentation is consistent with acute viral bronchitis that will likely improve with use of steroid and cough medications along with rest and increase fluid intake over the next few days.  Chest x-ray performed due to persistent cough is negative for active cardiopulmonary disease/abnormality. Patient is nontoxic in appearance with hemodynamically stable vital signs. Oxygenating well on room air without new oxygen requirement. No shortness of breath or chest pain reported.  Steroid burst, tessalon perles, and refill for albuterol prescription sent to pharmacy for symptomatic relief to be taken as prescribed.  Patient states his blood sugars are in the 190s to 200s, advised that the prednisone may cause his blood sugars to temporarily rise.  He states he expects this as well and will keep an eye on his diet and his sugars.  No NSAIDs while taking steroid. Advised to push fluids to stay well hydrated.   Discussed physical exam and available lab work findings in  clinic with patient.  Counseled patient regarding appropriate use of medications and potential side effects for all medications recommended or prescribed today. Discussed red flag signs and symptoms of worsening condition,when to call the PCP office, return to urgent care, and when to seek higher level of care in the emergency department. Patient verbalizes understanding and agreement with plan. All questions answered. Patient discharged in stable condition.    Final Clinical Impressions(s) / UC Diagnoses   Final diagnoses:  Acute bronchitis, unspecified organism  Persistent cough for 3 weeks or longer     Discharge Instructions      You have bronchitis which is inflammation of the upper airways in your lungs due to a virus. The following medicines will help with your symptoms.   - Take steroid pills sent to pharmacy as directed. Do not take any other NSAID containing medications such as ibuprofen or naproxen/Aleve while taking prednisone. - You may use albuterol inhaler 1 to 2 puffs every 4-6 hours as needed for cough, shortness of breath, and wheezing. - Take cough medicines as directed. - Continue using over the counter medicines as needed as directed. Plain mucinex (guaifenesin) over the counter may further help breakup mucus and help with symptoms.   If you develop  any new or worsening symptoms or do not improve in the next 2 to 3 days, please return.  If your symptoms are severe, please go to the emergency room.  Follow-up with your primary care provider for further evaluation and management of your symptoms as well as ongoing wellness visits.  I hope you feel better!     ED Prescriptions     Medication Sig Dispense Auth. Provider   predniSONE (DELTASONE) 20 MG tablet Take 2 tablets (40 mg total) by mouth daily for 5 days. 10 tablet Joella Prince M, FNP   benzonatate (TESSALON) 100 MG capsule Take 1 capsule (100 mg total) by mouth every 8 (eight) hours. 21 capsule Joella Prince M, FNP   albuterol (VENTOLIN HFA) 108 (90 Base) MCG/ACT inhaler Inhale 1-2 puffs into the lungs every 6 (six) hours as needed for wheezing or shortness of breath. 8 g Talbot Grumbling, FNP      PDMP not reviewed this encounter.   Joella Prince [place], [place] 07/25/22 [phone]

## 2022-07-25 NOTE — ED Triage Notes (Signed)
Pt reports at night symptoms are worse with coughing. When lays down his sinuses drain. Was seen here on Thursday and put on antibiotics.

## 2022-07-29 ENCOUNTER — Telehealth (HOSPITAL_COMMUNITY): Payer: Self-pay | Admitting: Emergency Medicine

## 2022-07-29 ENCOUNTER — Encounter (HOSPITAL_COMMUNITY): Payer: Self-pay | Admitting: *Deleted

## 2022-07-29 ENCOUNTER — Ambulatory Visit (HOSPITAL_COMMUNITY)
Admission: EM | Admit: 2022-07-29 | Discharge: 2022-07-29 | Disposition: A | Discharge status: Home / Self Care | Payer: Medicare HMO | Attending: Emergency Medicine | Admitting: Emergency Medicine

## 2022-07-29 DIAGNOSIS — J209 Acute bronchitis, unspecified: Secondary | ICD-10-CM | POA: Diagnosis present

## 2022-07-29 DIAGNOSIS — J029 Acute pharyngitis, unspecified: Secondary | ICD-10-CM | POA: Insufficient documentation

## 2022-07-29 LAB — POCT RAPID STREP A, ED / UC: Streptococcus, Group A Screen (Direct): NEGATIVE

## 2022-07-29 MED ORDER — IPRATROPIUM-ALBUTEROL 0.5-2.5 (3) MG/3ML IN SOLN: 3.0000 mL | Freq: Four times a day (QID) | RESPIRATORY_TRACT | 0 refills | Status: DC | PRN

## 2022-07-29 MED ORDER — FAMOTIDINE 20 MG PO TABS: 20.0000 mg | ORAL_TABLET | Freq: Every day | ORAL | 0 refills | Status: DC

## 2022-07-29 MED ORDER — OMEPRAZOLE 20 MG PO CPDR: 20.0000 mg | DELAYED_RELEASE_CAPSULE | Freq: Every day | ORAL | 0 refills | Status: DC

## 2022-07-29 MED ORDER — IPRATROPIUM-ALBUTEROL 0.5-2.5 (3) MG/3ML IN SOLN: 3.0000 mL | Freq: Four times a day (QID) | RESPIRATORY_TRACT | 0 refills | Status: AC | PRN

## 2022-07-29 NOTE — ED Triage Notes (Signed)
Pt states that he is following up from last visit. He finished steroids today they did raise his BS some so he adjusted his dose of steroids. He finished his antibiotics on Monday. He states that his throat feels like the congestion is stuck there and her can't get it up. He is taking the OTC meds for cough and congestion, nasal spray. He states he wasn't able to get the Guaifenesin or tessalon due to cost.

## 2022-07-29 NOTE — Telephone Encounter (Signed)
Sent DuoNeb today from pharmacy per patient request.

## 2022-07-29 NOTE — Discharge Instructions (Addendum)
Your symptoms are consistent with continued bronchitis.  It is imperative that you fill your prescription for DuoNeb, as we have given you the nebulizer machine in clinic.  I have attached a coupon for this medication and it is under $24, if you sign up for good Rx at [email or web address], the cost is lowered to under $14.  I also suspect he might be having some acid reflux, please start the Pepcid and Prilosec as ordered.  I have attached information for foods that help lower your stomach acid as well, please avoid acidic foods like tomatoes and red sauce, fried or overly processed foods.  It these might be cheaper over-the-counter, you are welcome to compare them.  Please return to clinic if you develop worsening shortness of breath, fever, or worsening of symptoms.

## 2022-07-29 NOTE — ED Provider Notes (Signed)
[facility]    CSN: [id number] Arrival date & time: 07/29/22  0815      History   Chief Complaint Chief Complaint  Patient presents with   Nasal Congestion   Cough    HPI Benjamin Hunter is a 62 y.o. male.   Reports he is feeling better than he has previously. Reports his throat is still sore, feels like his mucus gets stuck and he is unable to expel his mucus. He would like to be checked for strep due to ongoing sore throat.   At last visit he had been started on steroids, he did adjust his dose and decrease his steroids since they were raising his blood sugar.   Cough is worse at night, he has trouble coughing anything up and his throat gets sore, no hx of GERD. He noticed that this morning after he ate and laid down his dry cough and sore throat returned.   Is immunocompromised with HIV.   The history is provided by the patient.  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 No pertinent  surgical history.     Home Medications    Prior to Admission medications   Medication Sig Start Date End Date Taking? Authorizing Provider  albuterol (VENTOLIN HFA) 108 (90 Base) MCG/ACT inhaler Inhale 1-2 puffs into the lungs every 6 (six) hours as needed for wheezing or shortness of breath. 07/25/22  Yes Talbot Grumbling, FNP  Alcohol Swabs PADS 1 each by Does not apply route 3 (three) times daily before meals. 11/13/18  Yes Newlin, Enobong, MD  amLODipine (NORVASC) 5 MG tablet TAKE 1 TABLET(5 MG) BY MOUTH DAILY 11/13/18  Yes Newlin, Enobong, MD  atorvastatin (LIPITOR) 40 MG tablet Take 1 tablet (40 mg total) by mouth daily. 11/13/18  Yes Newlin, Charlane Ferretti, MD  B-D UF III MINI PEN NEEDLES 31G X 5 MM MISC USE TO INJECT AT BEDTIME 06/21/18  Yes Newlin, Enobong, MD  bictegravir-emtricitabine-tenofovir AF (BIKTARVY) 50-200-25 MG TABS tablet Take 1 tablet by mouth daily. 10/07/21  Yes Michel Bickers, MD  Blood Glucose Monitoring Suppl [facility] VERIO) w/Device KIT USE AS DIRECTED 03/14/18  Yes Charlott Rakes, MD  cetirizine (ZYRTEC ALLERGY) 10 MG tablet Take 1 tablet (10 mg total) by mouth daily. 08/19/20  Yes Jaynee Eagles, PA-C  diclofenac sodium (VOLTAREN) 1 % GEL Apply 2 g topically 4 (four) times daily. 09/08/18  Yes Robyn Haber, MD  famotidine (PEPCID) 20 MG tablet Take 1 tablet (20 mg total)  by mouth daily. 07/29/22  Yes Louretta Shorten, [place], FNP  fenofibrate 160 MG tablet Take 160 mg by mouth daily. 07/13/21  Yes [provider]  fluticasone (FLONASE) 50 MCG/ACT nasal spray Place 2 sprays into both nostrils daily. 11/13/18  Yes Newlin, Enobong, MD  glipiZIDE (GLUCOTROL) 10 MG tablet TAKE 1 TABLET(10 MG) BY MOUTH TWICE DAILY BEFORE A MEAL 11/13/18  Yes Newlin, Enobong, MD  glucose blood (ONETOUCH VERIO) test strip Use as instructed 03/14/18  Yes Newlin, Enobong, MD  Guaifenesin 1200 MG TB12 Take 1 tablet (1,200 mg total) by mouth in the morning and at bedtime. 10/23/21  Yes Talbot Grumbling, FNP  hydrocortisone (ANUSOL-HC) 2.5 % rectal cream Apply to rectal hemorrhoid BID PRN 08/20/18  Yes Ladell Pier, MD  hydrocortisone cream 1 % APP AA QID FOR 7 DAYS 05/21/19  Yes [provider]  hydrocortisone-pramoxine (ANALPRAM HC) 2.5-1 % rectal cream Place 1 application rectally 3 (three) times daily. 08/14/18  Yes Charlott Rakes, MD  Insulin Glargine (LANTUS SOLOSTAR) 100 UNIT/ML Solostar Pen Inject 35 Units into the skin at bedtime. 03/29/19  Yes Newlin, Charlane Ferretti, MD  ipratropium-albuterol (DUONEB) 0.5-2.5 (3) MG/3ML SOLN Take 3 mLs by nebulization every 6 (six) hours as needed for up to 10 days. 07/29/22 08/08/22 Yes Louretta Shorten, [place], FNP  Lancet Devices [facility]) lancets Use as instructed for 3 times daily testing of blood sugar. 12/07/15  Yes Charlott Rakes, MD  loperamide (IMODIUM) 2 MG capsule Take 1 capsule (2 mg total) by mouth 2 (two) times daily as needed for diarrhea or loose stools. 11/16/20  Yes Jaynee Eagles, PA-C  omeprazole (PRILOSEC) 20 MG capsule Take 1 capsule (20 mg total) by mouth daily. 07/29/22  Yes Louretta Shorten, [place], FNP  [facility] DELICA LANCETS [id number] MISC USE AS DIRECTED 03/14/18  Yes Charlott Rakes, MD  polyethylene glycol (MIRALAX) 17 g packet Take 17 g by mouth daily as needed for moderate constipation or severe constipation. 04/10/20  Yes Scot Jun, NP  sodium chloride (OCEAN) 0.65 % SOLN nasal spray Place 1 spray into both nostrils as needed for congestion. 02/07/20  Yes Darr, Edison Nasuti, PA-C  triamcinolone cream (KENALOG) 0.1 % Apply 1 application topically 2 (two) times daily. 09/20/20  Yes Volney American, PA-C  valACYclovir (VALTREX) 1000 MG tablet TAKE 1 TABLET(1000 MG) BY MOUTH TWICE DAILY 10/01/18  Yes Charlott Rakes, MD    Family History Family History  Problem Relation Age of Onset   Hypertension Mother    Heart failure Father     Social History Social History   Tobacco Use   Smoking status: Never    Smokeless tobacco: Never  Vaping Use   Vaping Use: Never used  Substance Use Topics   Alcohol use: Yes    Alcohol/week: 21.0 standard drinks of alcohol    Types: 21 Cans of beer per week   Drug use: No     Allergies   Shrimp [shellfish allergy] and Lisinopril   Review of Systems Review of Systems  Constitutional:  Negative for chills and fever.  HENT:  Positive for sore throat. Negative for ear pain.   Eyes:  Negative for pain and visual disturbance.  Respiratory:  Positive for cough, choking, shortness of breath and wheezing.   Cardiovascular:  Negative for chest pain and palpitations.  Gastrointestinal:  Negative for abdominal pain and vomiting.  Genitourinary:  Negative for dysuria and hematuria.  Musculoskeletal:  Negative for arthralgias and back pain.  Skin:  Negative for  color change and rash.  Neurological:  Negative for seizures and syncope.  All other systems reviewed and are negative.    Physical Exam Triage Vital Signs ED Triage Vitals  Enc Vitals Group     BP 07/29/22 0903 (!) 163/83     Pulse Rate 07/29/22 0903 88     Resp 07/29/22 0903 18     Temp 07/29/22 0903 98.2 F (36.8 C)     Temp Source 07/29/22 0903 Oral     SpO2 07/29/22 0903 97 %     Weight --      Height --      Head Circumference --      Peak Flow --      Pain Score 07/29/22 0901 0     Pain Loc --      Pain Edu? --      Excl. in [place]? --    No data found.  Updated Vital Signs BP (!) 163/83 (BP Location: Left Arm)   Pulse 88   Temp 98.2 F (36.8 C) (Oral)   Resp 18   SpO2 97%   Visual Acuity Right Eye Distance:   Left Eye Distance:   Bilateral Distance:    Right Eye Near:   Left Eye Near:    Bilateral Near:     Physical Exam Vitals and nursing note reviewed.  Constitutional:      General: He is not in acute distress.    Appearance: Normal appearance. He is well-developed.     Comments: Pleasant 62 year old male who appears stated age.  HENT:     Head: Normocephalic  and atraumatic.     Right Ear: External ear normal.     Left Ear: External ear normal.     Nose: Nose normal.     Mouth/Throat:     Mouth: Mucous membranes are moist.     Pharynx: Uvula midline. Posterior oropharyngeal erythema present. No oropharyngeal exudate or uvula swelling.     Tonsils: Tonsillar exudate present. No tonsillar abscesses. 1+ on the right. 1+ on the left.     Comments: Tonsils and posterior pharynx with erythema, tonsils with scant white patches bilaterally  Eyes:     Conjunctiva/sclera: Conjunctivae normal.  Cardiovascular:     Rate and Rhythm: Normal rate and regular rhythm.     Heart sounds: Normal heart sounds, S1 normal and S2 normal. No murmur heard. Pulmonary:     Effort: Pulmonary effort is normal. No respiratory distress.     Breath sounds: Wheezing present.     Comments: Lungs with bilateral upper expiratory wheezing.  Lower lobes clear to auscultation. Musculoskeletal:        General: No swelling.     Cervical back: Normal range of motion and neck supple.  Lymphadenopathy:     Cervical: Cervical adenopathy present.  Skin:    General: Skin is warm and dry.     Capillary Refill: Capillary refill takes less than 2 seconds.  Neurological:     Mental Status: He is alert.  Psychiatric:        Mood and Affect: Mood normal.        Behavior: Behavior is cooperative.      UC Treatments / Results  Labs (all labs ordered are listed, but only abnormal results are displayed) Labs Reviewed  CULTURE, GROUP A STREP [facility])  POCT RAPID STREP A, ED / UC    EKG   Radiology No results found.  Procedures Procedures (including critical care time)  Medications  Ordered in UC Medications - No data to display  Initial Impression / Assessment and Plan / UC Course  I have reviewed the triage vital signs and the nursing notes.  Pertinent labs & imaging results that were available during my care of the patient were reviewed by me and considered in my medical  decision making (see chart for details).  Vitals and triage reviewed, patient is hemodynamically stable.  Afebrile.  No acute distress.  Lungs with bilateral upper expiratory wheezing, low suspicion for PNA, multiple negative x-rays recently.  Suspect continued bronchitis, will start on DuoNeb as needed.  Nebulizer machine provided in clinic.  Ongoing dry, nonproductive cough and sore throat that worsens with laying down.  Suspect acid reflux.  Did on omeprazole and Pepcid.  Posterior pharynx with erythema and tonsils with scant exudate, rapid strep testing negative in clinic, will send off for culture d/t immunocompromised status.   Plan of care, return precautions and follow-up discussed, patient verbalized understanding.      Final Clinical Impressions(s) / UC Diagnoses   Final diagnoses:  Acute pharyngitis, unspecified etiology  Acute bronchitis, unspecified organism     Discharge Instructions      Your symptoms are consistent with continued bronchitis.  It is imperative that you fill your prescription for DuoNeb, as we have given you the nebulizer machine in clinic.  I have attached a coupon for this medication and it is under $24, if you sign up for good Rx at [email or web address], the cost is lowered to under $14.  I also suspect he might be having some acid reflux, please start the Pepcid and Prilosec as ordered.  I have attached information for foods that help lower your stomach acid as well, please avoid acidic foods like tomatoes and red sauce, fried or overly processed foods.  It these might be cheaper over-the-counter, you are welcome to compare them.  Please return to clinic if you develop worsening shortness of breath, fever, or worsening of symptoms.     ED Prescriptions     Medication Sig Dispense Auth. Provider   famotidine (PEPCID) 20 MG tablet Take 1 tablet (20 mg total) by mouth daily. 30 tablet Louretta Shorten, [place], [place]   omeprazole (PRILOSEC) 20 MG capsule Take 1 capsule  (20 mg total) by mouth daily. 30 capsule Louretta Shorten, [place], [place]   ipratropium-albuterol (DUONEB) 0.5-2.5 (3) MG/3ML SOLN Take 3 mLs by nebulization every 6 (six) hours as needed for up to 10 days. 120 mL Kimberly Nieland, [place], [place]      I have reviewed the PDMP during this encounter.   Venba Zenner, [place] N, [place] 07/29/22 1012

## 2022-07-31 LAB — CULTURE, GROUP A STREP (THRC)

## 2022-08-07 ENCOUNTER — Ambulatory Visit (HOSPITAL_COMMUNITY)
Admission: EM | Admit: 2022-08-07 | Discharge: 2022-08-07 | Disposition: A | Discharge status: Home / Self Care | Payer: Medicare HMO

## 2022-08-07 ENCOUNTER — Encounter (HOSPITAL_COMMUNITY): Payer: Self-pay

## 2022-08-07 DIAGNOSIS — K529 Noninfective gastroenteritis and colitis, unspecified: Secondary | ICD-10-CM | POA: Diagnosis not present

## 2022-08-07 NOTE — Discharge Instructions (Addendum)
Continue daily allergy medicine and nasal spray.  Drink lots of fluids! Monitor stool and please return if symptoms persist. Try bland diet. There is a list of foods attached that may help with diarrhea

## 2022-08-07 NOTE — ED Triage Notes (Signed)
Recently seen for Cough, SOB/Congestion. That has improved some. Requests recheck.  Concerned with possible food poisoning/sensitivity to fish/potato salad recently eaten, stomach ache and loose stools (all on Friday). This am feeling a lot better, medicine taken "like usual" and stools "with blood seen". Still some nausea (no vomiting).   No Fever. No runny nose. No current Cough/Sob.   Concerned due to HIV status (positive ) and immunity changes.

## 2022-08-07 NOTE — ED Provider Notes (Signed)
[place]    CSN: [id number] Arrival date & time: 08/07/22  1115     History   Chief Complaint Chief Complaint  Patient presents with   Follow-up    Cough    HPI Benjamin Hunter is a 62 y.o. male.  Seen twice in the last two weeks for cough and congestions. Reports symptoms have improved. He would like a "recheck" today. CXR on 2/26 was negative.  He is worried due to HIV+ status  Friday he had some loose stools after eating fish, potato salad. 2-3 episodes of vomiting Friday morning, none since. Stool has improved this morning. He did have a little streak of bright red blood in the stool. No pain with BM. History of hemorrhoid. Denies dark stool. No fevers. Tolerating fluids.  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 No pertinent surgical history.     Home Medications    Prior to Admission medications   Medication Sig Start Date End Date Taking? Authorizing Provider  albuterol (VENTOLIN HFA) 108 (90 Base) MCG/ACT inhaler Inhale 1-2 puffs into the lungs every 6 (six) hours as needed for wheezing or shortness of breath. 07/25/22  Yes Talbot Grumbling, FNP  Alcohol Swabs PADS 1  each by Does not apply route 3 (three) times daily before meals. 11/13/18  Yes Charlott Rakes, MD  amLODipine (NORVASC) 10 MG tablet Take 10 mg by mouth daily. 07/09/22  Yes [provider]  atorvastatin (LIPITOR) 80 MG tablet Take 80 mg by mouth daily. 07/12/22  Yes [provider]  B-D UF III MINI PEN NEEDLES 31G X 5 MM MISC USE TO INJECT AT BEDTIME 06/21/18  Yes Newlin, Charlane Ferretti, MD  BD PEN NEEDLE MICRO U/F 32G X 6 MM MISC SMARTSIG:Injection Every Morning 05/15/22  Yes [provider]  bictegravir-emtricitabine-tenofovir AF (BIKTARVY) 50-200-25 MG TABS tablet Take 1 tablet by mouth daily. 10/07/21  Yes Michel Bickers, MD  Blood Glucose Monitoring Suppl [facility] VERIO) w/Device KIT USE AS DIRECTED 03/14/18  Yes Charlott Rakes, MD  cetirizine (ZYRTEC ALLERGY) 10 MG tablet Take 1 tablet (10 mg total) by mouth daily. 08/19/20  Yes Jaynee Eagles, PA-C  diclofenac sodium (VOLTAREN) 1 % GEL Apply 2 g topically 4 (four) times daily. 09/08/18  Yes Robyn Haber, MD  fenofibrate 160 MG tablet Take 160 mg by mouth daily. 07/13/21  Yes [provider]  fluticasone (FLONASE) 50 MCG/ACT nasal spray Place 2 sprays into both nostrils daily. 11/13/18  Yes Newlin, Enobong, MD  glipiZIDE (GLUCOTROL) 10 MG tablet TAKE 1 TABLET(10 MG) BY MOUTH TWICE DAILY BEFORE A MEAL 11/13/18  Yes Newlin, Enobong, MD  glucose blood (ONETOUCH VERIO) test strip Use as instructed 03/14/18  Yes Charlott Rakes, MD  hydrocortisone (ANUSOL-HC) 2.5 % rectal cream Apply to rectal hemorrhoid BID PRN 08/20/18  Yes Ladell Pier, MD  hydrocortisone cream 1 % APP AA QID FOR 7 DAYS 05/21/19  Yes [provider]  hydrocortisone-pramoxine (ANALPRAM HC) 2.5-1 % rectal cream Place 1 application rectally 3 (three) times daily. 08/14/18  Yes Charlott Rakes, MD  Insulin Glargine (LANTUS SOLOSTAR) 100 UNIT/ML Solostar Pen Inject 35 Units into the skin at bedtime. 03/29/19  Yes Newlin, Charlane Ferretti, MD   ipratropium-albuterol (DUONEB) 0.5-2.5 (3) MG/3ML SOLN Take 3 mLs by nebulization every 6 (six) hours as needed for up to 10 days. 07/29/22 08/08/22 Yes Louretta Shorten, [place], FNP  ipratropium-albuterol (DUONEB) 0.5-2.5 (3) MG/3ML SOLN Take 3 mLs by nebulization every 6 (six) hours as needed for up to 10 days. Patient taking differently: Take 3 mLs by nebulization every 6 (six) hours as needed. Last used: 08-05-2022 07/29/22 08/08/22 Yes Louretta Shorten, [place], FNP  Lancet Devices [facility]) lancets Use as instructed for 3 times daily testing of blood sugar. 12/07/15  Yes Charlott Rakes, MD  ONETOUCH DELICA LANCETS [id number] MISC USE AS DIRECTED 03/14/18  Yes Charlott Rakes, MD  sodium chloride (OCEAN) 0.65 % SOLN nasal spray Place 1 spray into both nostrils as needed for congestion. 02/07/20  Yes Darr, Edison Nasuti, PA-C  triamcinolone cream (KENALOG) 0.1 % Apply 1 application topically 2 (two) times daily. 09/20/20  Yes Volney American, PA-C  valACYclovir (VALTREX) 1000 MG tablet TAKE 1 TABLET(1000 MG) BY MOUTH TWICE DAILY 10/01/18  Yes Newlin, Enobong, MD  Guaifenesin 1200 MG TB12 Take 1 tablet (1,200 mg total) by mouth in the morning and at bedtime. 10/23/21   Talbot Grumbling, FNP  loperamide (IMODIUM) 2 MG capsule Take 1 capsule (2 mg total) by mouth 2 (two) times daily as needed for diarrhea or loose stools. 11/16/20   Jaynee Eagles, PA-C  omeprazole (PRILOSEC) 20 MG capsule Take 1 capsule (20 mg total) by mouth daily. 07/29/22   [place], [place], FNP  polyethylene glycol (MIRALAX) 17 g packet Take 17 g by mouth daily as needed for moderate constipation or severe constipation. 04/10/20   Scot Jun, NP    Family History Family History  Problem Relation Age of Onset   Hypertension Mother    Heart failure Father     Social History Social History   Tobacco Use   Smoking status: Never   Smokeless tobacco: Never  Vaping Use   Vaping Use: Never used  Substance Use Topics   Alcohol  use: Yes    Alcohol/week: 2.0 standard drinks of alcohol    Types: 1 Cans of beer, 1 Shots of liquor per week    Comment: Last night. with 1 shot of liquor   Drug use: No     Allergies   Shrimp [shellfish allergy] and Lisinopril   Review of Systems Review of Systems As per HPI  Physical Exam Triage Vital Signs ED Triage Vitals  Enc Vitals Group     BP 08/07/22 1213 127/72     Pulse Rate 08/07/22 1213 96     Resp 08/07/22 1213 18     Temp 08/07/22 1213 99.3 F (37.4 C)     Temp Source 08/07/22 1213 Oral     SpO2 08/07/22 1213 96 %     Weight 08/07/22 1207 195 lb (88.5 kg)     Height 08/07/22 1207 '5\' 11"'$  (1.803 m)     Head Circumference --  Peak Flow --      Pain Score 08/07/22 1207 0     Pain Loc --      Pain Edu? --      Excl. in [place]? --    No data found.  Updated Vital Signs BP 127/72 (BP Location: Left Arm)   Pulse 96   Temp 99.3 F (37.4 C) (Oral)   Resp 18   Ht '5\' 11"'$  (1.803 m)   Wt 195 lb (88.5 kg)   SpO2 96%   BMI 27.20 kg/m   Physical Exam Vitals and nursing note reviewed.  Constitutional:      Comments: Pleasant, polite, well appearing   HENT:     Nose: No congestion or rhinorrhea.     Mouth/Throat:     Mouth: Mucous membranes are moist.     Pharynx: Oropharynx is clear.  Eyes:     Conjunctiva/sclera: Conjunctivae normal.  Cardiovascular:     Rate and Rhythm: Normal rate and regular rhythm.     Pulses: Normal pulses.     Heart sounds: Normal heart sounds.  Pulmonary:     Effort: Pulmonary effort is normal. No respiratory distress.     Breath sounds: Normal breath sounds. No wheezing.  Abdominal:     General: There is no distension.     Palpations: Abdomen is soft.     Tenderness: There is no abdominal tenderness. There is no guarding.  Skin:    General: Skin is warm and dry.  Neurological:     Mental Status: He is alert and oriented to person, place, and time.     UC Treatments / Results  Labs (all labs ordered are listed,  but only abnormal results are displayed) Labs Reviewed - No data to display  EKG   Radiology No results found.  Procedures Procedures (including critical care time)  Medications Ordered in UC Medications - No data to display  Initial Impression / Assessment and Plan / UC Course  I have reviewed the triage vital signs and the nursing notes.  Pertinent labs & imaging results that were available during my care of the patient were reviewed by me and considered in my medical decision making (see chart for details).  Clear lungs, afebrile, well appearing. Reassurance provided. Will continue nasal spray and allergy med as needed  Suspect viral gastroenteritis vs food [place]ne. Recommend increase fluids, bland diet. Provided list of foods to try to help with loose stool. Advised to monitor and return if symptoms persist or he has more blood in stool. Return precautions discussed. Patient agrees to plan  Final Clinical Impressions(s) / UC Diagnoses   Final diagnoses:  Gastroenteritis     Discharge Instructions      Continue daily allergy medicine and nasal spray.  Drink lots of fluids! Monitor stool and please return if symptoms persist. Try bland diet. There is a list of foods attached that may help with diarrhea     ED Prescriptions   None    PDMP not reviewed this encounter.   [place], [place] 08/07/22 [id number]

## 2022-08-09 ENCOUNTER — Other Ambulatory Visit: Payer: Medicare HMO

## 2022-08-09 ENCOUNTER — Other Ambulatory Visit: Payer: Self-pay

## 2022-08-09 DIAGNOSIS — B20 Human immunodeficiency virus [HIV] disease: Secondary | ICD-10-CM

## 2022-08-10 LAB — T-HELPER CELLS (CD4) COUNT (NOT AT ARMC)
CD4 % Helper T Cell: 41 % (ref 33–65)
CD4 T Cell Abs: 634 /uL (ref 400–1790)

## 2022-08-12 LAB — RPR: RPR Ser Ql: NONREACTIVE

## 2022-08-12 LAB — LIPID PANEL
Cholesterol: 121 mg/dL (ref <200)
HDL: 42 mg/dL (ref >=40)
LDL Cholesterol (Calc): 59 mg/dL
Non-HDL Cholesterol (Calc): 79 mg/dL (ref <130)
Total CHOL/HDL Ratio: 2.9 (calc) (ref <5.0)
Triglycerides: 112 mg/dL (ref <150)

## 2022-08-12 LAB — COMPREHENSIVE METABOLIC PANEL
AG Ratio: 1.3 (calc) (ref 1.0–2.5)
ALT: 15 U/L (ref 9–46)
AST: 19 U/L (ref 10–35)
Albumin: 4.1 g/dL (ref 3.6–5.1)
Alkaline phosphatase (APISO): 53 U/L (ref 35–144)
BUN/Creatinine Ratio: 14 (calc) (ref 6–22)
BUN: 22 mg/dL (ref 7–25)
CO2: 21 mmol/L (ref 20–32)
Calcium: 9.5 mg/dL (ref 8.6–10.3)
Chloride: 106 mmol/L (ref 98–110)
Creat: 1.53 mg/dL — ABNORMAL HIGH (ref 0.70–1.35)
Globulin: 3.2 g/dL (calc) (ref 1.9–3.7)
Glucose, Bld: 218 mg/dL — ABNORMAL HIGH (ref 65–99)
Potassium: 4.1 mmol/L (ref 3.5–5.3)
Sodium: 138 mmol/L (ref 135–146)
Total Bilirubin: 0.6 mg/dL (ref 0.2–1.2)
Total Protein: 7.3 g/dL (ref 6.1–8.1)

## 2022-08-12 LAB — CBC
HCT: 35 % — ABNORMAL LOW (ref 38.5–50.0)
Hemoglobin: 11.5 g/dL — ABNORMAL LOW (ref 13.2–17.1)
MCH: 30 pg (ref 27.0–33.0)
MCHC: 32.9 g/dL (ref 32.0–36.0)
MCV: 91.4 fL (ref 80.0–100.0)
MPV: 11.9 fL (ref 7.5–12.5)
Platelets: 171 10*3/uL (ref 140–400)
RBC: 3.83 10*6/uL — ABNORMAL LOW (ref 4.20–5.80)
RDW: 11.7 % (ref 11.0–15.0)
WBC: 4.5 10*3/uL (ref 3.8–10.8)

## 2022-08-12 LAB — HIV-1 RNA QUANT-NO REFLEX-BLD
HIV 1 RNA Quant: NOT DETECTED {copies}/mL
HIV-1 RNA Quant, Log: NOT DETECTED {Log_copies}/mL

## 2022-08-17 ENCOUNTER — Other Ambulatory Visit: Payer: Medicare HMO

## 2022-08-30 ENCOUNTER — Other Ambulatory Visit: Payer: Self-pay

## 2022-08-30 ENCOUNTER — Encounter: Payer: Self-pay | Admitting: Internal Medicine

## 2022-08-30 ENCOUNTER — Ambulatory Visit (INDEPENDENT_AMBULATORY_CARE_PROVIDER_SITE_OTHER): Payer: Medicare HMO | Admitting: Internal Medicine

## 2022-08-30 VITALS — BP 119/75 | HR 78 | Temp 98.3°F | Ht 71.0 in | Wt 192.0 lb

## 2022-08-30 DIAGNOSIS — B20 Human immunodeficiency virus [HIV] disease: Secondary | ICD-10-CM

## 2022-08-30 DIAGNOSIS — N189 Chronic kidney disease, unspecified: Secondary | ICD-10-CM

## 2022-08-30 MED ORDER — BIKTARVY 50-200-25 MG PO TABS: 1.0000 | ORAL_TABLET | Freq: Every day | ORAL | 11 refills | Status: DC

## 2022-08-30 NOTE — Progress Notes (Signed)
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    Priority: Medium    Seasonal allergies 09/20/2011    Priority: Medium    Polyarthritis 09/06/2011    Priority: Medium    Type 2 diabetes mellitus with hyperglycemia, with long-term current use of insulin 03/16/2009    Priority: Medium    Hyperlipidemia 07/17/2008    Priority: Medium    Hypertension 07/17/2008    Priority: Medium    CRI (chronic renal insufficiency) 08/30/2022   Bronchitis 11/23/2021   Right leg pain 05/15/2019   Varicose vein of leg 02/24/2016   Depression 01/14/2013   Rash and nonspecific skin eruption 10/11/2012   Recurrent low back pain 10/11/2012   Left facial numbness 08/20/2012    Patient's Medications  New Prescriptions   No medications on file  Previous Medications   ALBUTEROL (VENTOLIN HFA) 108 (90 BASE) MCG/ACT INHALER    Inhale 1-2 puffs into the lungs every 6 (six) hours as needed for wheezing or shortness of breath.   ALCOHOL SWABS PADS    1 each by Does not apply route 3 (three) times daily before meals.   AMLODIPINE (NORVASC) 10 MG TABLET    Take 10 mg by mouth daily.   ATORVASTATIN (LIPITOR) 80 MG TABLET    Take 80 mg by mouth daily.   B-D UF III MINI PEN NEEDLES 31G X 5 MM MISC    USE TO INJECT AT BEDTIME   BD PEN NEEDLE MICRO U/F 32G X 6 MM MISC    SMARTSIG:Injection Every Morning   BLOOD GLUCOSE MONITORING SUPPL (ONETOUCH VERIO) W/DEVICE KIT    USE AS DIRECTED   CETIRIZINE (ZYRTEC ALLERGY) 10 MG TABLET    Take 1 tablet (10 mg total) by mouth daily.   DICLOFENAC SODIUM (VOLTAREN) 1 % GEL    Apply 2 g topically 4 (four) times daily.   FENOFIBRATE 160 MG TABLET    Take 160 mg by mouth daily.   FLUTICASONE (FLONASE) 50 MCG/ACT NASAL SPRAY    Place 2 sprays into both nostrils daily.   GLIPIZIDE (GLUCOTROL) 10 MG TABLET     TAKE 1 TABLET(10 MG) BY MOUTH TWICE DAILY BEFORE A MEAL   GLUCOSE BLOOD (ONETOUCH VERIO) TEST STRIP    Use as instructed   GUAIFENESIN 1200 MG TB12    Take 1 tablet (1,200 mg total) by mouth in the morning and at bedtime.   HYDROCORTISONE (ANUSOL-HC) 2.5 % RECTAL CREAM    Apply to rectal hemorrhoid BID PRN   HYDROCORTISONE CREAM 1 %    APP AA QID FOR 7 DAYS   HYDROCORTISONE-PRAMOXINE (ANALPRAM HC) 2.5-1 % RECTAL CREAM    Place 1 application rectally 3 (three) times daily.   INSULIN GLARGINE (LANTUS SOLOSTAR) 100 UNIT/ML SOLOSTAR PEN    Inject 35 Units into the skin at bedtime.   IPRATROPIUM-ALBUTEROL (DUONEB) 0.5-2.5 (3) MG/3ML SOLN    Take 3 mLs by nebulization every 6 (six) hours as needed for up to 10 days.   IPRATROPIUM-ALBUTEROL (DUONEB) 0.5-2.5 (3) MG/3ML SOLN    Take 3 mLs by nebulization every 6 (six) hours as needed for up to 10 days.   LANCET DEVICES (ACCU-CHEK SOFTCLIX) LANCETS    Use as instructed for 3 times daily testing of blood sugar.   LOPERAMIDE (  IMODIUM) 2 MG CAPSULE    Take 1 capsule (2 mg total) by mouth 2 (two) times daily as needed for diarrhea or loose stools.   OMEPRAZOLE (PRILOSEC) 20 MG CAPSULE    Take 1 capsule (20 mg total) by mouth daily.   ONETOUCH DELICA LANCETS 99991111 MISC    USE AS DIRECTED   POLYETHYLENE GLYCOL (MIRALAX) 17 G PACKET    Take 17 g by mouth daily as needed for moderate constipation or severe constipation.   SODIUM CHLORIDE (OCEAN) 0.65 % SOLN NASAL SPRAY    Place 1 spray into both nostrils as needed for congestion.   TRIAMCINOLONE CREAM (KENALOG) 0.1 %    Apply 1 application topically 2 (two) times daily.   VALACYCLOVIR (VALTREX) 1000 MG TABLET    TAKE 1 TABLET(1000 MG) BY MOUTH TWICE DAILY  Modified Medications   Modified Medication Previous Medication   BICTEGRAVIR-EMTRICITABINE-TENOFOVIR AF (BIKTARVY) 50-200-25 MG TABS TABLET bictegravir-emtricitabine-tenofovir AF (BIKTARVY) 50-200-25 MG TABS tablet      Take 1 tablet by mouth daily.    Take 1  tablet by mouth daily.  Discontinued Medications   No medications on file    Subjective: Benjamin Hunter is in for his routine HIV follow-up visit.  He denies any problems obtaining, taking or tolerating his Biktarvy and as usual never misses doses.  He is feeling well.  He is not sure when his last COVID-vaccine was but he has his immunization card at home.  Review of Systems: Review of Systems  Constitutional:  Negative for fever and weight loss.    Past Medical History:  Diagnosis Date   Arthritis    Depression    Diabetes mellitus    HIV (human immunodeficiency virus infection)    Hyperlipidemia    Hypertension     Social History   Tobacco Use   Smoking status: Never   Smokeless tobacco: Never  Vaping Use   Vaping Use: Never used  Substance Use Topics   Alcohol use: Yes    Alcohol/week: 2.0 standard drinks of alcohol    Types: 1 Cans of beer, 1 Shots of liquor per week    Comment: Last night. with 1 shot of liquor   Drug use: No    Family History  Problem Relation Age of Onset   Hypertension Mother    Heart failure Father     Allergies  Allergen Reactions   Shrimp [Shellfish Allergy] Nausea And Vomiting   Lisinopril Rash    Palmar rash     Health Maintenance  Topic Date Due   Medicare Annual Wellness (AWV)  Never done   Zoster Vaccines- Shingrix (1 of 2) Never done   COLONOSCOPY (Pts 45-72yrs Insurance coverage will need to be confirmed)  Never done   OPHTHALMOLOGY EXAM  04/13/2016   FOOT EXAM  12/20/2018   Diabetic kidney evaluation - Urine ACR  04/19/2019   HEMOGLOBIN A1C  05/15/2019   COVID-19 Vaccine (3 - Moderna risk series) 09/30/2019   INFLUENZA VACCINE  12/29/2022   Diabetic kidney evaluation - eGFR measurement  08/09/2023   DTaP/Tdap/Td (2 - Td or Tdap) 07/05/2027   Hepatitis C Screening  Completed   HIV Screening  Completed   HPV VACCINES  Aged Out    Objective:  Vitals:   08/30/22 1426  BP: 119/75  Pulse: 78  Temp: 98.3 F (36.8 C)   TempSrc: Oral  SpO2: 99%  Weight: 192 lb (87.1 kg)  Height: 5\' 11"  (1.803 m)   Body mass index is  26.78 kg/m.  Physical Exam Constitutional:      Comments: His spirits are good as usual.  Cardiovascular:     Rate and Rhythm: Normal rate and regular rhythm.     Heart sounds: No murmur heard. Pulmonary:     Effort: Pulmonary effort is normal.     Breath sounds: Normal breath sounds.  Psychiatric:        Mood and Affect: Mood normal.     Lab Results Lab Results  Component Value Date   WBC 4.5 08/09/2022   HGB 11.5 (L) 08/09/2022   HCT 35.0 (L) 08/09/2022   MCV 91.4 08/09/2022   PLT 171 08/09/2022    Lab Results  Component Value Date   CREATININE 1.53 (H) 08/09/2022   BUN 22 08/09/2022   NA 138 08/09/2022   K 4.1 08/09/2022   CL 106 08/09/2022   CO2 21 08/09/2022    Lab Results  Component Value Date   ALT 15 08/09/2022   AST 19 08/09/2022   ALKPHOS 67 04/10/2020   BILITOT 0.6 08/09/2022    Lab Results  Component Value Date   CHOL 121 08/09/2022   HDL 42 08/09/2022   LDLCALC 59 08/09/2022   TRIG 112 08/09/2022   CHOLHDL 2.9 08/09/2022   Lab Results  Component Value Date   LABRPR NON-REACTIVE 08/09/2022   HIV 1 RNA Quant  Date Value  08/09/2022 Not Detected Copies/mL  09/15/2021 NOT DETECTED copies/mL  09/28/2020 Not Detected Copies/mL   CD4 T Cell Abs (/uL)  Date Value  08/09/2022 634  09/28/2020 482  03/10/2020 496     Problem List Items Addressed This Visit       High   Human immunodeficiency virus (HIV) disease    His infection remains under excellent, long-term control.  He will continue Biktarvy and follow-up after lab work in 1 year.      Relevant Medications   bictegravir-emtricitabine-tenofovir AF (BIKTARVY) 50-200-25 MG TABS tablet   Other Relevant Orders   CBC   T-helper cells (CD4) count (not at [facility])   Comprehensive metabolic panel   Lipid panel   RPR   HIV-1 RNA quant-no reflex-bld     Unprioritized   CRI (chronic  renal insufficiency) - Primary      Michel Bickers, MD [facility] [place] [address] [phone] pager   [phone] cell 08/30/2022, 2:42 PM

## 2022-08-30 NOTE — Assessment & Plan Note (Signed)
His infection remains under excellent, long-term control.  He will continue Biktarvy and follow-up after lab work in 1 year. 

## 2022-08-31 ENCOUNTER — Ambulatory Visit: Payer: Medicare HMO | Admitting: Internal Medicine

## 2022-12-14 ENCOUNTER — Telehealth: Payer: Self-pay

## 2022-12-14 NOTE — Telephone Encounter (Signed)
Patient called requesting appointment with dental team due to tooth ache. States tooth has been bothering him x2 days. Dental team not able to see him today, but will update staff if they have cancellation.  Requested patient follow up with [facility] to schedule appointment. Juanita Laster, RMA

## 2023-01-17 ENCOUNTER — Ambulatory Visit (HOSPITAL_COMMUNITY)
Admission: EM | Admit: 2023-01-17 | Discharge: 2023-01-17 | Disposition: A | Discharge status: Home / Self Care | Payer: Medicare HMO | Source: Home / Self Care | Attending: Family Medicine | Admitting: Family Medicine

## 2023-01-17 ENCOUNTER — Encounter (HOSPITAL_COMMUNITY): Payer: Self-pay

## 2023-01-17 DIAGNOSIS — J302 Other seasonal allergic rhinitis: Secondary | ICD-10-CM | POA: Diagnosis not present

## 2023-01-17 DIAGNOSIS — R0981 Nasal congestion: Secondary | ICD-10-CM | POA: Diagnosis not present

## 2023-01-17 MED ORDER — DEXAMETHASONE SODIUM PHOSPHATE 10 MG/ML IJ SOLN
INTRAMUSCULAR | Status: AC
  Filled 2023-01-17: qty 1

## 2023-01-17 MED ORDER — DEXAMETHASONE SODIUM PHOSPHATE 10 MG/ML IJ SOLN
10.0000 mg | Freq: Once | INTRAMUSCULAR | Status: AC
  Administered 2023-01-17: 10 mg via INTRAMUSCULAR

## 2023-01-17 NOTE — ED Triage Notes (Signed)
Pt c/o post nasal drip and scratchy throat x3 days. States hx of same every year and he gets a steroid shot and it goes away. States using OTC meds with little relief.

## 2023-01-17 NOTE — Discharge Instructions (Signed)
Meds ordered this encounter  Medications   dexamethasone (DECADRON) injection 10 mg    

## 2023-01-21 NOTE — ED Provider Notes (Signed)
  [facility]   [id number] 01/17/23 Arrival Time: 1647  ASSESSMENT & PLAN:  1. Seasonal allergies   2. Nasal congestion    Pt reports he has responded to below very well in the past. Given: Meds ordered this encounter  Medications   dexamethasone (DECADRON) injection 10 mg     Follow-up Information     Loura Back, NP.   Specialty: Nurse Practitioner Why: If worsening or failing to improve as anticipated. Contact information: [address] [place] [address] [phone]                 Reviewed expectations re: course of current medical issues. Questions answered. Outlined signs and symptoms indicating need for more acute intervention. Patient verbalized understanding. After Visit Summary given.   SUBJECTIVE: History from: patient.  Benjamin Hunter is a 62 y.o. male who reports post nasal drip and scratchy throat x3 days. States hx of same every year and he gets a steroid shot and it goes away. States using OTC meds with little relief.  presents with complaint of nasal congestion, post-nasal drainage, and sinus pain. Social History   Tobacco Use  Smoking Status Never  Smokeless Tobacco Never    OBJECTIVE:  Vitals:   01/17/23 1722  BP: (!) 143/79  Pulse: 77  Resp: 18  Temp: 98.2 F (36.8 C)  TempSrc: Oral  SpO2: 96%    General appearance: alert; no distress HEENT: nasal congestion; clear runny nose; throat irritation secondary to post-nasal drainage; no sinus TTP Neck: supple without LAD; trachea midline Lungs: unlabored respirations, symmetrical air entry; cough: absent; no respiratory distress Skin: warm and dry Psychological: alert and cooperative; normal mood and affect  Allergies  Allergen Reactions   Shrimp [Shellfish Allergy] Nausea And Vomiting   Lisinopril Rash    Palmar rash     Past Medical History:  Diagnosis Date   Arthritis    Depression    Diabetes mellitus    HIV (human immunodeficiency virus infection)  (HCC)    Hyperlipidemia    Hypertension    Family History  Problem Relation Age of Onset   Hypertension Mother    Heart failure Father    Social History   Socioeconomic History   Marital status: Single    Spouse name: Not on file   Number of children: Not on file   Years of education: Not on file   Highest education level: Not on file  Occupational History   Not on file  Tobacco Use   Smoking status: Never   Smokeless tobacco: Never  Vaping Use   Vaping status: Never Used  Substance and Sexual Activity   Alcohol use: Yes    Alcohol/week: 2.0 standard drinks of alcohol    Types: 1 Cans of beer, 1 Shots of liquor per week    Comment: Last night. with 1 shot of liquor   Drug use: No   Sexual activity: Not Currently    Partners: Male, Male  Other Topics Concern   Not on file  Social History Narrative   Not on file   Social Determinants of Health   Financial Resource Strain: Not on file  Food Insecurity: Not on file  Transportation Needs: Not on file  Physical Activity: Not on file  Stress: Not on file  Social Connections: Not on file  Intimate Partner Violence: Not on file             Mardella Layman, MD 01/21/23 1016

## 2023-01-24 ENCOUNTER — Encounter (HOSPITAL_COMMUNITY): Payer: Self-pay

## 2023-01-24 ENCOUNTER — Ambulatory Visit (HOSPITAL_COMMUNITY)
Admission: EM | Admit: 2023-01-24 | Discharge: 2023-01-24 | Disposition: A | Discharge status: Home / Self Care | Payer: Medicare HMO | Attending: Physician Assistant | Admitting: Physician Assistant

## 2023-01-24 DIAGNOSIS — U071 COVID-19: Secondary | ICD-10-CM | POA: Insufficient documentation

## 2023-01-24 NOTE — ED Triage Notes (Signed)
Patient here today with c/o sinus symptoms and headache X 10 days. Did home Covid test on Thursday and was positive. Here 1 week ago and had test done and it was negative. Patient would like to have another Covid test. He states that he is feeling better. He has been taking Tylenol which helps with the headache. He is HIV positive and would like to get Paxlovid.

## 2023-01-24 NOTE — Discharge Instructions (Signed)
Can continue with Mucinex for congestion and Tylenol for headache Drink plenty of fluids, rest.

## 2023-01-24 NOTE — ED Provider Notes (Signed)
[facility]    CSN: [id number] Arrival date & time: 01/24/23  1550      History   Chief Complaint Chief Complaint  Patient presents with   Nasal Congestion    HPI Benjamin Hunter is a 62 y.o. male.   Patient presents with mild sinus pressure and congestion.  He reports symptoms started about a week and a half ago.  He was seen here 01/17/2023 tested negative for COVID at that time.  He reports taking a home COVID test Thursday of last week which was positive.  He reports overall symptoms have improved, he is feeling better.  The only symptom he has continued with at this time is a headache.  He is taking Tylenol with improvement of headache.  Patient reports he has been using a nebulizer treatment and albuterol inhaler twice a day.  He denies wheezing, shortness of breath, cough.  He is requesting a repeat COVID test here in the clinic today.  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 No pertinent surgical history.     Home Medications    Prior to Admission medications   Medication Sig Start Date End  Date Taking? Authorizing Provider  albuterol (VENTOLIN HFA) 108 (90 Base) MCG/ACT inhaler Inhale 1-2 puffs into the lungs every 6 (six) hours as needed for wheezing or shortness of breath. 07/25/22   Carlisle Beers, FNP  Alcohol Swabs PADS 1 each by Does not apply route 3 (three) times daily before meals. 11/13/18   Hoy Register, MD  amLODipine (NORVASC) 10 MG tablet Take 10 mg by mouth daily. 07/09/22   [provider]  atorvastatin (LIPITOR) 80 MG tablet Take 80 mg by mouth daily. 07/12/22   [provider]  B-D UF III MINI PEN NEEDLES 31G X 5 MM MISC USE TO INJECT AT BEDTIME 06/21/18   Hoy Register, MD  BD PEN NEEDLE MICRO U/F 32G X 6 MM MISC SMARTSIG:Injection Every Morning 05/15/22   [provider]  bictegravir-emtricitabine-tenofovir AF (BIKTARVY) 50-200-25 MG TABS tablet Take 1 tablet by mouth daily. 08/30/22   [name], MD  Blood Glucose Monitoring Suppl [facility] VERIO) w/Device KIT USE AS DIRECTED 03/14/18   Hoy Register, MD  cetirizine (ZYRTEC ALLERGY) 10 MG tablet Take 1 tablet (10 mg total) by mouth daily. 08/19/20   Wallis Bamberg, PA-C  diclofenac sodium (VOLTAREN) 1 % GEL Apply 2 g topically 4 (four) times daily. 09/08/18   Elvina Sidle, MD  fenofibrate 160 MG tablet Take 160 mg by mouth daily. 07/13/21   [provider]  fluticasone (FLONASE) 50 MCG/ACT  nasal spray Place 2 sprays into both nostrils daily. 11/13/18   Hoy Register, MD  glipiZIDE (GLUCOTROL) 10 MG tablet TAKE 1 TABLET(10 MG) BY MOUTH TWICE DAILY BEFORE A MEAL 11/13/18   Hoy Register, MD  glucose blood (ONETOUCH VERIO) test strip Use as instructed 03/14/18   Hoy Register, MD  Guaifenesin 1200 MG TB12 Take 1 tablet (1,200 mg total) by mouth in the morning and at bedtime. 10/23/21   Carlisle Beers, FNP  hydrocortisone (ANUSOL-HC) 2.5 % rectal cream Apply to rectal hemorrhoid BID PRN 08/20/18   Marcine Matar, MD  hydrocortisone cream 1 % APP AA QID FOR 7  DAYS 05/21/19   [provider]  hydrocortisone-pramoxine (ANALPRAM HC) 2.5-1 % rectal cream Place 1 application rectally 3 (three) times daily. 08/14/18   Hoy Register, MD  Insulin Glargine (LANTUS SOLOSTAR) 100 UNIT/ML Solostar Pen Inject 35 Units into the skin at bedtime. 03/29/19   Hoy Register, MD  ipratropium-albuterol (DUONEB) 0.5-2.5 (3) MG/3ML SOLN Take 3 mLs by nebulization every 6 (six) hours as needed for up to 10 days. 07/29/22 08/30/22  Garrison, [place] N, FNP  ipratropium-albuterol (DUONEB) 0.5-2.5 (3) MG/3ML SOLN Take 3 mLs by nebulization every 6 (six) hours as needed for up to 10 days. Patient taking differently: Take 3 mLs by nebulization every 6 (six) hours as needed. Last used: 08-05-2022 07/29/22 08/08/22  Garrison, [place] N, FNP  Lancet Devices [facility]) lancets Use as instructed for 3 times daily testing of blood sugar. 12/07/15   Hoy Register, MD  loperamide (IMODIUM) 2 MG capsule Take 1 capsule (2 mg total) by mouth 2 (two) times daily as needed for diarrhea or loose stools. 11/16/20   Wallis Bamberg, PA-C  omeprazole (PRILOSEC) 20 MG capsule Take 1 capsule (20 mg total) by mouth daily. Patient not taking: Reported on 08/30/2022 07/29/22   Garrison, [place] N, FNP  [facility] DELICA LANCETS 33G MISC USE AS DIRECTED 03/14/18   Hoy Register, MD  polyethylene glycol (MIRALAX) 17 g packet Take 17 g by mouth daily as needed for moderate constipation or severe constipation. Patient not taking: Reported on 08/30/2022 04/10/20   Bing Neighbors, NP  sodium chloride (OCEAN) 0.65 % SOLN nasal spray Place 1 spray into both nostrils as needed for congestion. Patient not taking: Reported on 08/30/2022 02/07/20   Darr, Gerilyn Pilgrim, PA-C  triamcinolone cream (KENALOG) 0.1 % Apply 1 application topically 2 (two) times daily. Patient not taking: Reported on 08/30/2022 09/20/20   Particia Nearing, PA-C  valACYclovir (VALTREX) 1000 MG tablet TAKE 1 TABLET(1000 MG) BY MOUTH TWICE  DAILY 10/01/18   Hoy Register, MD    Family History Family History  Problem Relation Age of Onset   Hypertension Mother    Heart failure Father     Social History Social History   Tobacco Use   Smoking status: Never   Smokeless tobacco: Never  Vaping Use   Vaping status: Never Used  Substance Use Topics   Alcohol use: Yes    Alcohol/week: 2.0 standard drinks of alcohol    Types: 1 Cans of beer, 1 Shots of liquor per week    Comment: Last night. with 1 shot of liquor   Drug use: No     Allergies   Shrimp [shellfish allergy] and Lisinopril   Review of Systems Review of Systems  Constitutional:  Negative for chills and fever.  HENT:  Positive for congestion. Negative for ear pain and sore throat.   Eyes:  Negative for pain  and visual disturbance.  Respiratory:  Negative for cough and shortness of breath.   Cardiovascular:  Negative for chest pain and palpitations.  Gastrointestinal:  Negative for abdominal pain and vomiting.  Genitourinary:  Negative for dysuria and hematuria.  Musculoskeletal:  Negative for arthralgias and back pain.  Skin:  Negative for color change and rash.  Neurological:  Positive for headaches. Negative for seizures and syncope.  All other systems reviewed and are negative.    Physical Exam Triage Vital Signs ED Triage Vitals  Encounter Vitals Group     BP 01/24/23 1649 134/82     Systolic BP Percentile --      Diastolic BP Percentile --      Pulse Rate 01/24/23 1649 75     Resp 01/24/23 1649 16     Temp 01/24/23 1649 97.7 F (36.5 C)     Temp Source 01/24/23 1649 Oral     SpO2 01/24/23 1649 100 %     Weight 01/24/23 1648 190 lb (86.2 kg)     Height 01/24/23 1648 5\' 11"  (1.803 m)     Head Circumference --      Peak Flow --      Pain Score 01/24/23 1647 4     Pain Loc --      Pain Education --      Exclude from Growth Chart --    No data found.  Updated Vital Signs BP 134/82 (BP Location: Right Arm)   Pulse 75   Temp 97.7  F (36.5 C) (Oral)   Resp 16   Ht 5\' 11"  (1.803 m)   Wt 190 lb (86.2 kg)   SpO2 100%   BMI 26.50 kg/m   Visual Acuity Right Eye Distance:   Left Eye Distance:   Bilateral Distance:    Right Eye Near:   Left Eye Near:    Bilateral Near:     Physical Exam Vitals and nursing note reviewed.  Constitutional:      General: He is not in acute distress.    Appearance: He is well-developed.  HENT:     Head: Normocephalic and atraumatic.  Eyes:     Conjunctiva/sclera: Conjunctivae normal.  Cardiovascular:     Rate and Rhythm: Normal rate and regular rhythm.     Heart sounds: No murmur heard. Pulmonary:     Effort: Pulmonary effort is normal. No respiratory distress.     Breath sounds: Normal breath sounds.  Abdominal:     Palpations: Abdomen is soft.     Tenderness: There is no abdominal tenderness.  Musculoskeletal:        General: No swelling.     Cervical back: Neck supple.  Skin:    General: Skin is warm and dry.     Capillary Refill: Capillary refill takes less than 2 seconds.  Neurological:     Mental Status: He is alert.  Psychiatric:        Mood and Affect: Mood normal.      UC Treatments / Results  Labs (all labs ordered are listed, but only abnormal results are displayed) Labs Reviewed  SARS CORONAVIRUS 2 (TAT 6-24 HRS)    EKG   Radiology No results found.  Procedures Procedures (including critical care time)  Medications Ordered in UC Medications - No data to display  Initial Impression / Assessment and Plan / UC Course  I have reviewed the triage vital signs and the nursing notes.  Pertinent labs & imaging results that were available during  my care of the patient were reviewed by me and considered in my medical decision making (see chart for details).     Discussed no indication for chest x-ray at this time.  Lungs clear to auscultation.  Vitals within normal limits.  Patient denies cough, wheezing, shortness of breath.  Advised patient to  discontinue albuterol and nebulizer treatments and only use if he is having shortness of breath or wheezing.  Repeat COVID testing not advised.  Patient would feel more comfortable having the test repeated here in clinic today.  Discussed contraindication for patient to take Paxlovid with Biktarvy.  He is improving at this time and it has been more than 5 days as well. Final Clinical Impressions(s) / UC Diagnoses   Final diagnoses:  COVID     Discharge Instructions      Can continue with Mucinex for congestion and Tylenol for headache Drink plenty of fluids, rest.     ED Prescriptions   None    PDMP not reviewed this encounter.   Ward, Tylene Fantasia, PA-C 01/24/23 1723

## 2023-01-25 LAB — SARS CORONAVIRUS 2 (TAT 6-24 HRS): SARS Coronavirus 2: POSITIVE — AB

## 2023-05-08 ENCOUNTER — Ambulatory Visit (HOSPITAL_COMMUNITY)
Admission: RE | Admit: 2023-05-08 | Discharge: 2023-05-08 | Disposition: A | Discharge status: Home / Self Care | Payer: Self-pay | Source: Ambulatory Visit | Attending: Family Medicine | Admitting: Family Medicine

## 2023-05-08 ENCOUNTER — Encounter (HOSPITAL_COMMUNITY): Payer: Self-pay

## 2023-05-08 VITALS — BP 128/73 | HR 74 | Temp 98.0°F | Resp 18 | Ht 71.0 in | Wt 198.0 lb

## 2023-05-08 DIAGNOSIS — J069 Acute upper respiratory infection, unspecified: Secondary | ICD-10-CM | POA: Diagnosis not present

## 2023-05-08 NOTE — Discharge Instructions (Addendum)
You were seen today for upper respiratory symptoms.  There is no evidence of bacterial infection at this time.  No need for an antibiotic, no need for a steroid either.  I recommend you continue over the counter medications.  If you having worsening sinus pain/pressure, or you develop fever, chills, wheezing or shortness of breath, then please return for re-evaluation.

## 2023-05-08 NOTE — ED Triage Notes (Signed)
Pt presents with nasal congestion x 5 days. Pt currently denies cough and fevers. Pt states he took an at-home COVID test that was negative about three days ago. Pt states he has been taking OTC medicines such as "nose spray, non-drowsy daytime cough syrup, and mucus relief" with little relief "just takes time to kick in."  Pt denies pain at this time.

## 2023-05-08 NOTE — ED Provider Notes (Signed)
[facility]    CSN: [id number] Arrival date & time: 05/08/23  1013      History   Chief Complaint Chief Complaint  Patient presents with   Nasal Congestion    Entered by patient    HPI Benjamin Hunter is a 62 y.o. male.   Patient is here for URI.  For the last 5-6 days having sinus congestion, drainage.  He usually gets this this time of year.   No cough.  Mild hoarseness.  No wheezing or sob.  No fevers/chills.  Some headaches, but that has improved.  He has used otc cough syrup, and sinus medications as well.  He is here to make sure he does have pneumonia or bronchitis.  Home covid test was negative 3 days ago.  He usually gets an abx or steroid if needed and wonders if he needs that today.        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 No pertinent surgical history.     Home Medications    Prior to Admission medications   Medication Sig Start Date End Date Taking? Authorizing Provider  albuterol (VENTOLIN HFA) 108 (90 Base) MCG/ACT inhaler Inhale 1-2 puffs into the lungs every 6 (six) hours as needed for wheezing or  shortness of breath. 07/25/22   Carlisle Beers, FNP  Alcohol Swabs PADS 1 each by Does not apply route 3 (three) times daily before meals. 11/13/18   Hoy Register, MD  amLODipine (NORVASC) 10 MG tablet Take 10 mg by mouth daily. 07/09/22   [provider]  atorvastatin (LIPITOR) 80 MG tablet Take 80 mg by mouth daily. 07/12/22   [provider]  B-D UF III MINI PEN NEEDLES 31G X 5 MM MISC USE TO INJECT AT BEDTIME 06/21/18   Hoy Register, MD  BD PEN NEEDLE MICRO U/F 32G X 6 MM MISC SMARTSIG:Injection Every Morning 05/15/22   [provider]  bictegravir-emtricitabine-tenofovir AF (BIKTARVY) 50-200-25 MG TABS tablet Take 1 tablet by mouth daily. 08/30/22   [name], MD  Blood Glucose Monitoring Suppl [facility] VERIO) w/Device KIT USE AS DIRECTED 03/14/18   Hoy Register, MD  cetirizine (ZYRTEC ALLERGY) 10 MG tablet Take 1 tablet (10 mg total) by mouth daily. 08/19/20   Wallis Bamberg, PA-C  diclofenac sodium (VOLTAREN) 1 % GEL Apply 2 g topically 4 (four) times daily. 09/08/18   Elvina Sidle, MD  fenofibrate 160 MG tablet Take 160 mg by mouth daily. 07/13/21   [provider]  fluticasone (FLONASE) 50 MCG/ACT nasal spray Place 2 sprays into both nostrils daily. 11/13/18   Hoy Register, MD  glipiZIDE (GLUCOTROL) 10 MG  tablet TAKE 1 TABLET(10 MG) BY MOUTH TWICE DAILY BEFORE A MEAL 11/13/18   Hoy Register, MD  glucose blood (ONETOUCH VERIO) test strip Use as instructed 03/14/18   Hoy Register, MD  Guaifenesin 1200 MG TB12 Take 1 tablet (1,200 mg total) by mouth in the morning and at bedtime. 10/23/21   Carlisle Beers, FNP  hydrocortisone (ANUSOL-HC) 2.5 % rectal cream Apply to rectal hemorrhoid BID PRN 08/20/18   Marcine Matar, MD  hydrocortisone cream 1 % APP AA QID FOR 7 DAYS 05/21/19   [provider]  hydrocortisone-pramoxine (ANALPRAM HC) 2.5-1 % rectal cream Place 1 application rectally 3 (three) times daily. 08/14/18    Hoy Register, MD  Insulin Glargine (LANTUS SOLOSTAR) 100 UNIT/ML Solostar Pen Inject 35 Units into the skin at bedtime. 03/29/19   Hoy Register, MD  ipratropium-albuterol (DUONEB) 0.5-2.5 (3) MG/3ML SOLN Take 3 mLs by nebulization every 6 (six) hours as needed for up to 10 days. 07/29/22 08/30/22  Garrison, [place] N, FNP  ipratropium-albuterol (DUONEB) 0.5-2.5 (3) MG/3ML SOLN Take 3 mLs by nebulization every 6 (six) hours as needed for up to 10 days. Patient taking differently: Take 3 mLs by nebulization every 6 (six) hours as needed. Last used: 08-05-2022 07/29/22 08/08/22  Garrison, [place] N, FNP  Lancet Devices [facility]) lancets Use as instructed for 3 times daily testing of blood sugar. 12/07/15   Hoy Register, MD  loperamide (IMODIUM) 2 MG capsule Take 1 capsule (2 mg total) by mouth 2 (two) times daily as needed for diarrhea or loose stools. 11/16/20   Wallis Bamberg, PA-C  omeprazole (PRILOSEC) 20 MG capsule Take 1 capsule (20 mg total) by mouth daily. Patient not taking: Reported on 08/30/2022 07/29/22   Garrison, [place] N, FNP  [facility] DELICA LANCETS 33G MISC USE AS DIRECTED 03/14/18   Hoy Register, MD  polyethylene glycol (MIRALAX) 17 g packet Take 17 g by mouth daily as needed for moderate constipation or severe constipation. Patient not taking: Reported on 08/30/2022 04/10/20   Bing Neighbors, NP  sodium chloride (OCEAN) 0.65 % SOLN nasal spray Place 1 spray into both nostrils as needed for congestion. Patient not taking: Reported on 08/30/2022 02/07/20   Darr, Gerilyn Pilgrim, PA-C  triamcinolone cream (KENALOG) 0.1 % Apply 1 application topically 2 (two) times daily. Patient not taking: Reported on 08/30/2022 09/20/20   Particia Nearing, PA-C  valACYclovir (VALTREX) 1000 MG tablet TAKE 1 TABLET(1000 MG) BY MOUTH TWICE DAILY 10/01/18   Hoy Register, MD    Family History Family History  Problem Relation Age of Onset   Hypertension Mother    Heart failure Father     Social  History Social History   Tobacco Use   Smoking status: Never   Smokeless tobacco: Never  Vaping Use   Vaping status: Never Used  Substance Use Topics   Alcohol use: Yes    Alcohol/week: 2.0 standard drinks of alcohol    Types: 1 Cans of beer, 1 Shots of liquor per week    Comment: Last night. with 1 shot of liquor   Drug use: No     Allergies   Shrimp [shellfish allergy] and Lisinopril   Review of Systems Review of Systems  Constitutional: Negative.   HENT:  Positive for congestion and rhinorrhea.   Respiratory: Negative.    Cardiovascular: Negative.   Gastrointestinal: Negative.   Genitourinary: Negative.   Musculoskeletal: Negative.   Neurological: Negative.   Psychiatric/Behavioral: Negative.       Physical  Exam Triage Vital Signs ED Triage Vitals  Encounter Vitals Group     BP 05/08/23 1054 128/73     Systolic BP Percentile --      Diastolic BP Percentile --      Pulse Rate 05/08/23 1054 74     Resp 05/08/23 1054 18     Temp 05/08/23 1054 98 F (36.7 C)     Temp src --      SpO2 05/08/23 1054 98 %     Weight 05/08/23 1053 198 lb (89.8 kg)     Height 05/08/23 1053 5\' 11"  (1.803 m)     Head Circumference --      Peak Flow --      Pain Score 05/08/23 1053 0     Pain Loc --      Pain Education --      Exclude from Growth Chart --    No data found.  Updated Vital Signs BP 128/73 (BP Location: Right Arm)   Pulse 74   Temp 98 F (36.7 C)   Resp 18   Ht 5\' 11"  (1.803 m)   Wt 89.8 kg   SpO2 98%   BMI 27.62 kg/m   Visual Acuity Right Eye Distance:   Left Eye Distance:   Bilateral Distance:    Right Eye Near:   Left Eye Near:    Bilateral Near:     Physical Exam Constitutional:      General: He is not in acute distress.    Appearance: Normal appearance. He is not ill-appearing.  HENT:     Nose: Nose normal. No congestion or rhinorrhea.     Right Sinus: No maxillary sinus tenderness or frontal sinus tenderness.     Left Sinus: No  maxillary sinus tenderness or frontal sinus tenderness.     Mouth/Throat:     Mouth: Mucous membranes are moist.  Cardiovascular:     Rate and Rhythm: Normal rate and regular rhythm.  Pulmonary:     Effort: Pulmonary effort is normal.     Breath sounds: Normal breath sounds.  Musculoskeletal:     Cervical back: Normal range of motion and neck supple. No tenderness.  Skin:    General: Skin is warm.  Neurological:     General: No focal deficit present.     Mental Status: He is alert.  Psychiatric:        Mood and Affect: Mood normal.      UC Treatments / Results  Labs (all labs ordered are listed, but only abnormal results are displayed) Labs Reviewed - No data to display  EKG   Radiology No results found.  Procedures Procedures (including critical care time)  Medications Ordered in UC Medications - No data to display  Initial Impression / Assessment and Plan / UC Course  I have reviewed the triage vital signs and the nursing notes.  Pertinent labs & imaging results that were available during my care of the patient were reviewed by me and considered in my medical decision making (see chart for details).   Final Clinical Impressions(s) / UC Diagnoses   Final diagnoses:  Upper respiratory tract infection, unspecified type     Discharge Instructions      You were seen today for upper respiratory symptoms.  There is no evidence of bacterial infection at this time.  No need for an antibiotic, no need for a steroid either.  I recommend you continue over the counter medications.  If you having worsening sinus  pain/pressure, or you develop fever, chills, wheezing or shortness of breath, then please return for re-evaluation.     ED Prescriptions   None    PDMP not reviewed this encounter.   Jannifer Franklin, MD 05/08/23 1110

## 2023-06-08 ENCOUNTER — Ambulatory Visit (HOSPITAL_COMMUNITY)
Admission: EM | Admit: 2023-06-08 | Discharge: 2023-06-08 | Disposition: A | Discharge status: Home / Self Care | Payer: Medicare HMO | Attending: Family Medicine | Admitting: Family Medicine

## 2023-06-08 ENCOUNTER — Encounter (HOSPITAL_COMMUNITY): Payer: Self-pay | Admitting: Emergency Medicine

## 2023-06-08 DIAGNOSIS — J069 Acute upper respiratory infection, unspecified: Secondary | ICD-10-CM

## 2023-06-08 MED ORDER — METHYLPREDNISOLONE 4 MG PO TBPK: ORAL_TABLET | ORAL | 0 refills | Status: DC

## 2023-06-08 NOTE — ED Notes (Signed)
 Patient requested provider be questioned about steroid and his diabetes.  Spoke to provider.  This nurse assured patient that provider aware of diabetes and described this steroid dosing as low .  CBG readings may increase, but minimally and return to baseline when medication completed

## 2023-06-08 NOTE — ED Provider Notes (Signed)
 [facility]    CSN: [id number] Arrival date & time: 06/08/23  0857      History   Chief Complaint Chief Complaint  Patient presents with   Nasal Congestion   Cough    HPI Benjamin Hunter is a 63 y.o. male.    Cough Associated symptoms: rhinorrhea    Patient is here for URI symptoms.  He has had 5 days of sinus congestion, drainage, PND.  He has a slight cough with sputum production.  No wheezing or sob . No fevers/chills.  He has been using his nebulizer, nasal spray with help.   He was seen in December for similar symptoms, and that resolved with otc medications.         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 No pertinent surgical history.     Home Medications    Prior to Admission medications   Medication Sig Start Date End Date Taking? Authorizing Provider  albuterol  (VENTOLIN  HFA) 108 (90 Base) MCG/ACT inhaler Inhale 1-2 puffs into the lungs every 6 (six) hours as needed for wheezing or shortness of breath. 07/25/22   Enedelia Dorna HERO, FNP  Alcohol  Swabs  PADS 1 each by Does not apply route 3 (three) times daily  before meals. 11/13/18   Newlin, Enobong, MD  amLODipine  (NORVASC ) 10 MG tablet Take 10 mg by mouth daily. 07/09/22   [provider]  atorvastatin  (LIPITOR) 80 MG tablet Take 80 mg by mouth daily. 07/12/22   [provider]  B-D UF III MINI PEN NEEDLES 31G X 5 MM MISC USE TO INJECT AT BEDTIME 06/21/18   Newlin, Enobong, MD  BD PEN NEEDLE MICRO U/F 32G X 6 MM MISC SMARTSIG:Injection Every Morning 05/15/22   [provider]  bictegravir-emtricitabine -tenofovir  AF (BIKTARVY ) 50-200-25 MG TABS tablet Take 1 tablet by mouth daily. 08/30/22   Elaine Rush, MD  Blood Glucose Monitoring Suppl [facility] VERIO) w/Device KIT USE AS DIRECTED 03/14/18   Newlin, Enobong, MD  cetirizine  (ZYRTEC  ALLERGY) 10 MG tablet Take 1 tablet (10 mg total) by mouth daily. 08/19/20   Christopher Savannah, PA-C  diclofenac  sodium (VOLTAREN ) 1 % GEL Apply 2 g topically 4 (four) times daily. 09/08/18   Mario Million, MD  fenofibrate  160 MG tablet Take 160 mg by mouth daily. 07/13/21   [provider]  fluticasone  (FLONASE ) 50 MCG/ACT nasal spray Place 2 sprays into both nostrils daily. 11/13/18   Newlin, Enobong, MD  glipiZIDE  (GLUCOTROL ) 10 MG tablet TAKE 1 TABLET(10 MG) BY MOUTH TWICE DAILY BEFORE A MEAL 11/13/18   Newlin, Enobong, MD  glucose blood (ONETOUCH VERIO) test strip Use as instructed 03/14/18  Newlin, Enobong, MD  Guaifenesin  1200 MG TB12 Take 1 tablet (1,200 mg total) by mouth in the morning and at bedtime. 10/23/21   Enedelia Dorna HERO, FNP  hydrocortisone  (ANUSOL -HC) 2.5 % rectal cream Apply to rectal hemorrhoid BID PRN 08/20/18   Vicci Barnie NOVAK, MD  hydrocortisone  cream 1 % APP AA QID FOR 7 DAYS 05/21/19   [provider]  hydrocortisone -pramoxine (ANALPRAM  HC) 2.5-1 % rectal cream Place 1 application rectally 3 (three) times daily. 08/14/18   Newlin, Enobong, MD  Insulin  Glargine (LANTUS  SOLOSTAR) 100 UNIT/ML Solostar Pen Inject 35 Units into the skin at bedtime. 03/29/19    Newlin, Enobong, MD  ipratropium-albuterol  (DUONEB) 0.5-2.5 (3) MG/3ML SOLN Take 3 mLs by nebulization every 6 (six) hours as needed for up to 10 days. 07/29/22 08/30/22  Dreama, [place]  N, FNP  ipratropium-albuterol  (DUONEB) 0.5-2.5 (3) MG/3ML SOLN Take 3 mLs by nebulization every 6 (six) hours as needed for up to 10 days. Patient taking differently: Take 3 mLs by nebulization every 6 (six) hours as needed. Last used: 08-05-2022 07/29/22 08/08/22  Dreama, [place]  N, FNP  Lancet Devices [facility]) lancets Use as instructed for 3 times daily testing of blood sugar. 12/07/15   Newlin, Enobong, MD  loperamide  (IMODIUM ) 2 MG capsule Take 1 capsule (2 mg total) by mouth 2 (two) times daily as needed for diarrhea or loose stools. 11/16/20   Christopher Savannah, PA-C  omeprazole  (PRILOSEC) 20 MG capsule Take 1 capsule (20 mg total) by mouth daily. Patient not taking: Reported on 08/30/2022 07/29/22   Dreama Carolanne SAILOR, FNP  [facility] DELICA LANCETS 33G MISC USE AS DIRECTED 03/14/18   Newlin, Enobong, MD  polyethylene glycol (MIRALAX ) 17 g packet Take 17 g by mouth daily as needed for moderate constipation or severe constipation. Patient not taking: Reported on 08/30/2022 04/10/20   Arloa Suzen RAMAN, NP  sodium chloride  (OCEAN) 0.65 % SOLN nasal spray Place 1 spray into both nostrils as needed for congestion. Patient not taking: Reported on 08/30/2022 02/07/20   Darr, Jacob, PA-C  triamcinolone  cream (KENALOG ) 0.1 % Apply 1 application topically 2 (two) times daily. Patient not taking: Reported on 08/30/2022 09/20/20   Stuart Vernell Norris, PA-C  valACYclovir  (VALTREX ) 1000 MG tablet TAKE 1 TABLET(1000 MG) BY MOUTH TWICE DAILY 10/01/18   Delbert Clam, MD    Family History Family History  Problem Relation Age of Onset   Hypertension Mother    Heart failure Father     Social History Social History   Tobacco Use   Smoking status: Never   Smokeless tobacco: Never  Vaping Use   Vaping status: Never Used   Substance Use Topics   Alcohol  use: Yes    Alcohol /week: 2.0 standard drinks of alcohol     Types: 1 Cans of beer, 1 Shots of liquor per week    Comment: Last night. with 1 shot of liquor   Drug use: No     Allergies   Shrimp [shellfish allergy] and Lisinopril    Review of Systems Review of Systems  Constitutional: Negative.   HENT:  Positive for congestion and rhinorrhea.   Respiratory:  Positive for cough.   Cardiovascular: Negative.   Gastrointestinal: Negative.   Musculoskeletal: Negative.   Psychiatric/Behavioral: Negative.       Physical Exam Triage Vital Signs ED Triage Vitals  Encounter Vitals Group     BP 06/08/23 0913 130/78     Systolic BP Percentile --      Diastolic BP Percentile --  Pulse Rate 06/08/23 0913 80     Resp 06/08/23 0913 16     Temp 06/08/23 0913 98.5 F (36.9 C)     Temp Source 06/08/23 0913 Oral     SpO2 06/08/23 0913 97 %     Weight --      Height --      Head Circumference --      Peak Flow --      Pain Score 06/08/23 0911 0     Pain Loc --      Pain Education --      Exclude from Growth Chart --    No data found.  Updated Vital Signs BP 130/78 (BP Location: Left Arm)   Pulse 80   Temp 98.5 F (36.9 C) (Oral)   Resp 16   SpO2 97%   Visual Acuity Right Eye Distance:   Left Eye Distance:   Bilateral Distance:    Right Eye Near:   Left Eye Near:    Bilateral Near:     Physical Exam Constitutional:      Appearance: Normal appearance.  Cardiovascular:     Rate and Rhythm: Normal rate and regular rhythm.  Pulmonary:     Breath sounds: Wheezing present.     Comments: Slight scattered wheezes throughout Neurological:     General: No focal deficit present.     Mental Status: He is alert.  Psychiatric:        Mood and Affect: Mood normal.      UC Treatments / Results  Labs (all labs ordered are listed, but only abnormal results are displayed) Labs Reviewed - No data to display  EKG   Radiology No  results found.  Procedures Procedures (including critical care time)  Medications Ordered in UC Medications - No data to display  Initial Impression / Assessment and Plan / UC Course  I have reviewed the triage vital signs and the nursing notes.  Pertinent labs & imaging results that were available during my care of the patient were reviewed by me and considered in my medical decision making (see chart for details).   Final Clinical Impressions(s) / UC Diagnoses   Final diagnoses:  Upper respiratory tract infection, unspecified type     Discharge Instructions      You were seen today for upper respiratory symptoms  I have sent out a steroid pack for your cough and congestion.  You may continue your over the counter and prescription medications otherwise.  Please return if not improving.     ED Prescriptions     Medication Sig Dispense Auth. Provider   methylPREDNISolone  (MEDROL  DOSEPAK) 4 MG TBPK tablet Take as directed 1 each Darral Longs, MD      PDMP not reviewed this encounter.   Darral Longs, MD 06/08/23 [phone]

## 2023-06-08 NOTE — ED Triage Notes (Signed)
 Pt had nasal congestion and cough for 5 days. Reports when coughs is getting up little phlegm. Using OTC medications to treat symptoms: nasal spray, cough medications, mucous relief pills., and Vit C drink.

## 2023-06-08 NOTE — Discharge Instructions (Signed)
 You were seen today for upper respiratory symptoms  I have sent out a steroid pack for your cough and congestion.  You may continue your over the counter and prescription medications otherwise.  Please return if not improving.

## 2023-06-12 ENCOUNTER — Encounter (HOSPITAL_COMMUNITY): Payer: Self-pay

## 2023-06-12 ENCOUNTER — Ambulatory Visit (HOSPITAL_COMMUNITY)
Admission: RE | Admit: 2023-06-12 | Discharge: 2023-06-12 | Disposition: A | Discharge status: Home / Self Care | Payer: Medicare HMO | Source: Ambulatory Visit | Attending: Emergency Medicine | Admitting: Emergency Medicine

## 2023-06-12 ENCOUNTER — Other Ambulatory Visit: Payer: Self-pay

## 2023-06-12 ENCOUNTER — Ambulatory Visit (INDEPENDENT_AMBULATORY_CARE_PROVIDER_SITE_OTHER): Payer: Medicare HMO

## 2023-06-12 VITALS — BP 118/72 | HR 72 | Temp 97.9°F | Resp 18

## 2023-06-12 DIAGNOSIS — J069 Acute upper respiratory infection, unspecified: Secondary | ICD-10-CM | POA: Diagnosis not present

## 2023-06-12 DIAGNOSIS — R051 Acute cough: Secondary | ICD-10-CM | POA: Diagnosis not present

## 2023-06-12 MED ORDER — AZITHROMYCIN 250 MG PO TABS: ORAL_TABLET | ORAL | 0 refills | Status: DC

## 2023-06-12 MED ORDER — IPRATROPIUM-ALBUTEROL 0.5-2.5 (3) MG/3ML IN SOLN
RESPIRATORY_TRACT | Status: AC
  Filled 2023-06-12: qty 3

## 2023-06-12 MED ORDER — IPRATROPIUM-ALBUTEROL 0.5-2.5 (3) MG/3ML IN SOLN
3.0000 mL | Freq: Once | RESPIRATORY_TRACT | Status: AC
  Administered 2023-06-12: 3 mL via RESPIRATORY_TRACT

## 2023-06-12 NOTE — ED Triage Notes (Addendum)
 Patient was seen on 1/9.  Patient has steroids he is about to finish-tomorrow will be last day.  Still feels congestion in upper chest.  Home covid test performed yesterday and it was negative.  Symptoms are a little better.  Throughout intake , patient repeatedly comments about wondering if he needs a chest xray to make sure what's going on.  Patient is doing nebulizer, nasal sprays

## 2023-06-12 NOTE — Discharge Instructions (Signed)
 Start taking azithromycin  as prescribed over the next few days. Continue taking the prednisone  until finished.  You can continue using nebulizer treatments as needed.  Return here if symptoms persist or become worse.  If you develop severe trouble breathing, chest pain, or high fevers unrelieved by medication please seek immediate medical treatment in the ER.

## 2023-06-12 NOTE — ED Provider Notes (Signed)
 [facility]    CSN: [id number] Arrival date & time: 06/12/23  0813      History   Chief Complaint Chief Complaint  Patient presents with   Influenza    upper respiratory tract infection - Entered by patient    HPI Benjamin Hunter is a 63 y.o. male.   Patient presents with continued chest congestion, fatigue, and chills after being seen on 1/9 and given steroids for upper respiratory infection.  Denies shortness of breath and chest pain.  Requesting chest x-ray at this time.   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 No pertinent surgical history.     Home Medications    Prior to Admission medications   Medication Sig Start Date End Date Taking? Authorizing Provider  azithromycin  (ZITHROMAX  Z-PAK) 250 MG tablet Take 2 pills (500mg ) first day and one pill (250mg ) the remaining 4 days. 06/12/23  Yes Johnie Flaming A, NP  albuterol  (VENTOLIN  HFA) 108 (90 Base)  MCG/ACT inhaler Inhale 1-2 puffs into the lungs every 6 (six) hours as needed for wheezing or shortness of breath. 07/25/22   Enedelia Dorna HERO, FNP  Alcohol  Swabs  PADS 1 each by Does not apply route 3 (three) times daily before meals. 11/13/18   Newlin, Enobong, MD  amLODipine  (NORVASC ) 10 MG tablet Take 10 mg by mouth daily. 07/09/22   [provider]  atorvastatin  (LIPITOR) 80 MG tablet Take 80 mg by mouth daily. 07/12/22   [provider]  B-D UF III MINI PEN NEEDLES 31G X 5 MM MISC USE TO INJECT AT BEDTIME 06/21/18   Newlin, Enobong, MD  BD PEN NEEDLE MICRO U/F 32G X 6 MM MISC SMARTSIG:Injection Every Morning 05/15/22   [provider]  bictegravir-emtricitabine -tenofovir  AF (BIKTARVY ) 50-200-25 MG TABS tablet Take 1 tablet by mouth daily. 08/30/22   Elaine Rush, MD  Blood Glucose Monitoring Suppl [facility] VERIO) w/Device KIT USE AS DIRECTED 03/14/18   Newlin, Enobong, MD  cetirizine  (ZYRTEC  ALLERGY) 10 MG tablet Take 1 tablet (10 mg total) by mouth daily. 08/19/20   Christopher Savannah, PA-C  diclofenac  sodium (VOLTAREN ) 1 % GEL Apply 2 g topically 4 (four) times daily. 09/08/18   Mario Million, MD  fenofibrate  160 MG tablet Take 160 mg by mouth daily. 07/13/21   [provider]  fluticasone  (FLONASE ) 50 MCG/ACT nasal spray Place 2 sprays into both nostrils daily. 11/13/18   Newlin, Enobong, MD  glipiZIDE  (GLUCOTROL ) 10 MG tablet TAKE 1  TABLET(10 MG) BY MOUTH TWICE DAILY BEFORE A MEAL 11/13/18   Newlin, Enobong, MD  glucose blood (ONETOUCH VERIO) test strip Use as instructed 03/14/18   Newlin, Enobong, MD  Guaifenesin  1200 MG TB12 Take 1 tablet (1,200 mg total) by mouth in the morning and at bedtime. 10/23/21   Enedelia Dorna HERO, FNP  hydrocortisone  (ANUSOL -HC) 2.5 % rectal cream Apply to rectal hemorrhoid BID PRN 08/20/18   Vicci Barnie NOVAK, MD  hydrocortisone  cream 1 % APP AA QID FOR 7 DAYS 05/21/19   [provider]  hydrocortisone -pramoxine (ANALPRAM   HC) 2.5-1 % rectal cream Place 1 application rectally 3 (three) times daily. 08/14/18   Newlin, Enobong, MD  Insulin  Glargine (LANTUS  SOLOSTAR) 100 UNIT/ML Solostar Pen Inject 35 Units into the skin at bedtime. 03/29/19   Newlin, Enobong, MD  ipratropium-albuterol  (DUONEB) 0.5-2.5 (3) MG/3ML SOLN Take 3 mLs by nebulization every 6 (six) hours as needed for up to 10 days. 07/29/22 08/30/22  Dreama, [place]  N, FNP  ipratropium-albuterol  (DUONEB) 0.5-2.5 (3) MG/3ML SOLN Take 3 mLs by nebulization every 6 (six) hours as needed for up to 10 days. Patient taking differently: Take 3 mLs by nebulization every 6 (six) hours as needed. Last used: 08-05-2022 07/29/22 08/08/22  Dreama, [place]  N, FNP  Lancet Devices [facility]) lancets Use as instructed for 3 times daily testing of blood sugar. 12/07/15   Newlin, Enobong, MD  loperamide  (IMODIUM ) 2 MG capsule Take 1 capsule (2 mg total) by mouth 2 (two) times daily as needed for diarrhea or loose stools. 11/16/20   Christopher Savannah, PA-C  methylPREDNISolone  (MEDROL  DOSEPAK) 4 MG TBPK tablet Take as directed 06/08/23   Darral Longs, MD  omeprazole  (PRILOSEC) 20 MG capsule Take 1 capsule (20 mg total) by mouth daily. Patient not taking: Reported on 08/30/2022 07/29/22   Dreama Parks SAILOR, FNP  [facility] DELICA LANCETS 33G MISC USE AS DIRECTED 03/14/18   Newlin, Enobong, MD  polyethylene glycol (MIRALAX ) 17 g packet Take 17 g by mouth daily as needed for moderate constipation or severe constipation. Patient not taking: Reported on 08/30/2022 04/10/20   Arloa Suzen RAMAN, NP  sodium chloride  (OCEAN) 0.65 % SOLN nasal spray Place 1 spray into both nostrils as needed for congestion. Patient not taking: Reported on 08/30/2022 02/07/20   Darr, Jacob, PA-C  triamcinolone  cream (KENALOG ) 0.1 % Apply 1 application topically 2 (two) times daily. Patient not taking: Reported on 08/30/2022 09/20/20   Stuart Vernell Norris, PA-C  valACYclovir  (VALTREX ) 1000 MG tablet TAKE 1 TABLET(1000  MG) BY MOUTH TWICE DAILY 10/01/18   Delbert Clam, MD    Family History Family History  Problem Relation Age of Onset   Hypertension Mother    Heart failure Father     Social History Social History   Tobacco Use   Smoking status: Never   Smokeless tobacco: Never  Vaping Use   Vaping status: Never Used  Substance Use Topics   Alcohol  use: Yes    Alcohol /week: 2.0 standard drinks of alcohol     Types: 1 Cans of beer, 1 Shots of liquor per week    Comment: Last night. with 1 shot of liquor   Drug use: No     Allergies   Shrimp [shellfish allergy] and Lisinopril    Review of Systems Review of Systems  Constitutional:  Positive for chills and fatigue. Negative for fever.  HENT:  Positive for congestion. Negative for sinus pressure and sinus pain.   Respiratory:  Positive for cough. Negative for chest  tightness and shortness of breath.   Cardiovascular:  Negative for chest pain.  Neurological:  Negative for dizziness, light-headedness and headaches.     Physical Exam Triage Vital Signs ED Triage Vitals  Encounter Vitals Group     BP 06/12/23 0852 118/72     Systolic BP Percentile --      Diastolic BP Percentile --      Pulse Rate 06/12/23 0852 72     Resp 06/12/23 0852 18     Temp 06/12/23 0852 97.9 F (36.6 C)     Temp Source 06/12/23 0852 Oral     SpO2 06/12/23 0852 100 %     Weight --      Height --      Head Circumference --      Peak Flow --      Pain Score 06/12/23 0849 0     Pain Loc --      Pain Education --      Exclude from Growth Chart --    No data found.  Updated Vital Signs BP 118/72   Pulse 72   Temp 97.9 F (36.6 C) (Oral)   Resp 18   SpO2 100%   Visual Acuity Right Eye Distance:   Left Eye Distance:   Bilateral Distance:    Right Eye Near:   Left Eye Near:    Bilateral Near:     Physical Exam Vitals and nursing note reviewed.  Constitutional:      General: He is awake. He is not in acute distress.    Appearance: Normal  appearance. He is well-developed and well-groomed. He is not ill-appearing.  HENT:     Right Ear: Tympanic membrane, ear canal and external ear normal.     Left Ear: Tympanic membrane, ear canal and external ear normal.     Nose: Congestion and rhinorrhea present.     Mouth/Throat:     Mouth: Mucous membranes are moist.     Pharynx: Posterior oropharyngeal erythema present. No oropharyngeal exudate.  Cardiovascular:     Rate and Rhythm: Normal rate and regular rhythm.  Pulmonary:     Effort: Pulmonary effort is normal.     Breath sounds: Examination of the left-upper field reveals wheezing. Wheezing present.  Skin:    General: Skin is warm and dry.  Neurological:     Mental Status: He is alert.  Psychiatric:        Behavior: Behavior is cooperative.      UC Treatments / Results  Labs (all labs ordered are listed, but only abnormal results are displayed) Labs Reviewed - No data to display  EKG   Radiology DG Chest 2 View Result Date: 06/12/2023 CLINICAL DATA:  Wheezing and congestion. EXAM: CHEST - 2 VIEW COMPARISON:  Chest radiograph dated 07/25/2022 FINDINGS: The heart size and mediastinal contours are within normal limits. Both lungs are clear. The visualized skeletal structures are unremarkable. IMPRESSION: No active cardiopulmonary disease. Electronically Signed   By: Vanetta Chou M.D.   On: 06/12/2023 10:16    Procedures Procedures (including critical care time)  Medications Ordered in UC Medications  ipratropium-albuterol  (DUONEB) 0.5-2.5 (3) MG/3ML nebulizer solution 3 mL (3 mLs Nebulization Given 06/12/23 1012)    Initial Impression / Assessment and Plan / UC Course  I have reviewed the triage vital signs and the nursing notes.  Pertinent labs & imaging results that were available during my care of the patient were reviewed by me and considered in my medical decision  making (see chart for details).     Patient presented with continued chest congestion,  fatigue, and chills after being seen on 1/9 and given steroids for upper respiratory infection.  Upon assessment patient has mild wheezing to left upper lobe.  Chest x-ray ordered and revealed no active cardiopulmonary disease at this time.  Given DuoNeb in clinic with relief of wheezing.  Prescribed azithromycin  for continued upper respiratory infection.  Discussed follow-up, return, emergency department precautions. Final Clinical Impressions(s) / UC Diagnoses   Final diagnoses:  Acute cough  Acute upper respiratory infection     Discharge Instructions      Start taking azithromycin  as prescribed over the next few days. Continue taking the prednisone  until finished.  You can continue using nebulizer treatments as needed.  Return here if symptoms persist or become worse.  If you develop severe trouble breathing, chest pain, or high fevers unrelieved by medication please seek immediate medical treatment in the ER.    ED Prescriptions     Medication Sig Dispense Auth. Provider   azithromycin  (ZITHROMAX  Z-PAK) 250 MG tablet Take 2 pills (500mg ) first day and one pill (250mg ) the remaining 4 days. 6 tablet Johnie Flaming A, NP      PDMP not reviewed this encounter.   Johnie Flaming A, NP 06/12/23 1311

## 2023-07-03 ENCOUNTER — Ambulatory Visit (HOSPITAL_COMMUNITY)
Admission: EM | Admit: 2023-07-03 | Discharge: 2023-07-03 | Disposition: A | Discharge status: Home / Self Care | Payer: Medicare HMO

## 2023-07-03 ENCOUNTER — Encounter (HOSPITAL_COMMUNITY): Payer: Self-pay

## 2023-07-03 DIAGNOSIS — J309 Allergic rhinitis, unspecified: Secondary | ICD-10-CM | POA: Diagnosis not present

## 2023-07-03 NOTE — ED Provider Notes (Signed)
[facility]    CSN: [id number] Arrival date & time: 07/03/23  0813      History   Chief Complaint Chief Complaint  Patient presents with   Cough   Nasal Congestion    HPI Benjamin Hunter is a 62 y.o. male.   HPI   Patient has been seen 05/08/23, 06/08/23, 06/12/23 for similar complaints- has been treated with steroid and at last visit was given Zpack  He reports today that he started to feel better but symptoms have returned  He reports symptoms resolved the best with Zpack  He has been taking OTC medications as well as nasal sprays  He reports he still has congestion and some mild blood from nasal passages.   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 No pertinent surgical history.     Home Medications    Prior to Admission medications   Medication Sig Start Date End Date Taking? Authorizing Provider  albuterol (VENTOLIN HFA) 108 (90 Base) MCG/ACT inhaler Inhale 1-2 puffs into the lungs every 6 (six) hours as needed for wheezing or shortness of breath. 07/25/22   Carlisle Beers, FNP   Alcohol Swabs PADS 1 each by Does not apply route 3 (three) times daily before meals. 11/13/18   Hoy Register, MD  amLODipine (NORVASC) 10 MG tablet Take 10 mg by mouth daily. 07/09/22   [provider]  atorvastatin (LIPITOR) 80 MG tablet Take 80 mg by mouth daily. 07/12/22   [provider]  azithromycin (ZITHROMAX Z-PAK) 250 MG tablet Take 2 pills (500mg ) first day and one pill (250mg ) the remaining 4 days. 06/12/23   Wynonia Lawman A, NP  B-D UF III MINI PEN NEEDLES 31G X 5 MM MISC USE TO INJECT AT BEDTIME 06/21/18   Hoy Register, MD  BD PEN NEEDLE MICRO U/F 32G X 6 MM MISC SMARTSIG:Injection Every Morning 05/15/22   [provider]  bictegravir-emtricitabine-tenofovir AF (BIKTARVY) 50-200-25 MG TABS tablet Take 1 tablet by mouth daily. 08/30/22   [name], MD  Blood Glucose Monitoring Suppl [facility] VERIO) w/Device KIT USE AS DIRECTED 03/14/18   Hoy Register, MD  cetirizine (ZYRTEC ALLERGY) 10 MG tablet Take 1 tablet (10 mg total) by mouth daily. 08/19/20   Wallis Bamberg, PA-C  diclofenac sodium (VOLTAREN) 1 % GEL Apply 2 g topically 4 (four) times daily. 09/08/18   Elvina Sidle, MD  fenofibrate 160 MG tablet Take 160 mg by mouth daily. 07/13/21   [provider]  fluticasone (FLONASE) 50 MCG/ACT nasal spray Place 2 sprays into both nostrils daily. 11/13/18  Newlin, Enobong, MD  glipiZIDE (GLUCOTROL) 10 MG tablet TAKE 1 TABLET(10 MG) BY MOUTH TWICE DAILY BEFORE A MEAL 11/13/18   Hoy Register, MD  glucose blood (ONETOUCH VERIO) test strip Use as instructed 03/14/18   Hoy Register, MD  Guaifenesin 1200 MG TB12 Take 1 tablet (1,200 mg total) by mouth in the morning and at bedtime. 10/23/21   Carlisle Beers, FNP  hydrocortisone (ANUSOL-HC) 2.5 % rectal cream Apply to rectal hemorrhoid BID PRN 08/20/18   Marcine Matar, MD  hydrocortisone cream 1 % APP AA QID FOR 7 DAYS 05/21/19   [provider]  hydrocortisone-pramoxine  (ANALPRAM HC) 2.5-1 % rectal cream Place 1 application rectally 3 (three) times daily. 08/14/18   Hoy Register, MD  Insulin Glargine (LANTUS SOLOSTAR) 100 UNIT/ML Solostar Pen Inject 35 Units into the skin at bedtime. 03/29/19   Hoy Register, MD  ipratropium-albuterol (DUONEB) 0.5-2.5 (3) MG/3ML SOLN Take 3 mLs by nebulization every 6 (six) hours as needed for up to 10 days. 07/29/22 08/30/22  Garrison, [place] N, FNP  ipratropium-albuterol (DUONEB) 0.5-2.5 (3) MG/3ML SOLN Take 3 mLs by nebulization every 6 (six) hours as needed for up to 10 days. Patient taking differently: Take 3 mLs by nebulization every 6 (six) hours as needed. Last used: 08-05-2022 07/29/22 08/08/22  Garrison, [place] N, FNP  Lancet Devices [facility]) lancets Use as instructed for 3 times daily testing of blood sugar. 12/07/15   Hoy Register, MD  loperamide (IMODIUM) 2 MG capsule Take 1 capsule (2 mg total) by mouth 2 (two) times daily as needed for diarrhea or loose stools. 11/16/20   Wallis Bamberg, PA-C  methylPREDNISolone (MEDROL DOSEPAK) 4 MG TBPK tablet Take as directed 06/08/23   Jannifer Franklin, MD  omeprazole (PRILOSEC) 20 MG capsule Take 1 capsule (20 mg total) by mouth daily. Patient not taking: Reported on 08/30/2022 07/29/22   Garrison, [place] N, FNP  [facility] DELICA LANCETS 33G MISC USE AS DIRECTED 03/14/18   Hoy Register, MD  polyethylene glycol (MIRALAX) 17 g packet Take 17 g by mouth daily as needed for moderate constipation or severe constipation. Patient not taking: Reported on 08/30/2022 04/10/20   Bing Neighbors, NP  sodium chloride (OCEAN) 0.65 % SOLN nasal spray Place 1 spray into both nostrils as needed for congestion. Patient not taking: Reported on 08/30/2022 02/07/20   Darr, Gerilyn Pilgrim, PA-C  triamcinolone cream (KENALOG) 0.1 % Apply 1 application topically 2 (two) times daily. Patient not taking: Reported on 08/30/2022 09/20/20   Particia Nearing, PA-C  valACYclovir (VALTREX) 1000 MG tablet TAKE 1  TABLET(1000 MG) BY MOUTH TWICE DAILY 10/01/18   Hoy Register, MD    Family History Family History  Problem Relation Age of Onset   Hypertension Mother    Heart failure Father     Social History Social History   Tobacco Use   Smoking status: Never   Smokeless tobacco: Never  Vaping Use   Vaping status: Never Used  Substance Use Topics   Alcohol use: Yes    Alcohol/week: 2.0 standard drinks of alcohol    Types: 1 Cans of beer, 1 Shots of liquor per week    Comment: Last night. with 1 shot of liquor   Drug use: No     Allergies   Shrimp [shellfish allergy] and Lisinopril   Review of Systems Review of Systems  Constitutional:  Negative for chills and fever.  HENT:  Positive for congestion, rhinorrhea, sinus pressure and sinus pain. Negative for postnasal  drip.   Neurological:  Positive for headaches.     Physical Exam Triage Vital Signs ED Triage Vitals  Encounter Vitals Group     BP 07/03/23 0853 137/78     Systolic BP Percentile --      Diastolic BP Percentile --      Pulse Rate 07/03/23 0853 84     Resp 07/03/23 0853 18     Temp 07/03/23 0853 98.3 F (36.8 C)     Temp src --      SpO2 07/03/23 0853 96 %     Weight --      Height --      Head Circumference --      Peak Flow --      Pain Score 07/03/23 0906 0     Pain Loc --      Pain Education --      Exclude from Growth Chart --    No data found.  Updated Vital Signs BP 137/78   Pulse 84   Temp 98.3 F (36.8 C)   Resp 18   SpO2 96%   Visual Acuity Right Eye Distance:   Left Eye Distance:   Bilateral Distance:    Right Eye Near:   Left Eye Near:    Bilateral Near:     Physical Exam Vitals reviewed.  Constitutional:      General: He is awake.     Appearance: Normal appearance. He is well-developed and well-groomed.  HENT:     Head: Normocephalic and atraumatic.     Right Ear: Hearing, tympanic membrane and ear canal normal.     Left Ear: Hearing, tympanic membrane and ear canal  normal.     Nose: Nose normal. No nasal tenderness, mucosal edema, congestion or rhinorrhea.     Right Turbinates: Not enlarged, swollen or pale.     Left Turbinates: Not enlarged, swollen or pale.     Mouth/Throat:     Lips: Pink.     Mouth: Mucous membranes are moist.     Pharynx: Uvula midline. No pharyngeal swelling, oropharyngeal exudate, posterior oropharyngeal erythema, uvula swelling or postnasal drip.  Cardiovascular:     Rate and Rhythm: Normal rate and regular rhythm.     Heart sounds: Normal heart sounds.  Pulmonary:     Effort: Pulmonary effort is normal.     Breath sounds: Normal breath sounds. No decreased air movement. No decreased breath sounds, wheezing, rhonchi or rales.  Musculoskeletal:     Cervical back: Normal range of motion and neck supple.  Lymphadenopathy:     Head:     Right side of head: No submental, submandibular or preauricular adenopathy.     Left side of head: No submental, submandibular or preauricular adenopathy.     Cervical:     Right cervical: No superficial cervical adenopathy.    Left cervical: No superficial cervical adenopathy.     Upper Body:     Right upper body: No supraclavicular adenopathy.     Left upper body: No supraclavicular adenopathy.  Neurological:     Mental Status: He is alert.  Psychiatric:        Mood and Affect: Mood normal.        Behavior: Behavior normal. Behavior is cooperative.        Thought Content: Thought content normal.        Judgment: Judgment normal.      UC Treatments / Results  Labs (all labs ordered are listed, but only  abnormal results are displayed) Labs Reviewed - No data to display  EKG   Radiology No results found.  Procedures Procedures (including critical care time)  Medications Ordered in UC Medications - No data to display  Initial Impression / Assessment and Plan / UC Course  I have reviewed the triage vital signs and the nursing notes.  Pertinent labs & imaging results that  were available during my care of the patient were reviewed by me and considered in my medical decision making (see chart for details).      Final Clinical Impressions(s) / UC Diagnoses   Final diagnoses:  Allergic rhinitis, unspecified seasonality, unspecified trigger   Recurrent, ongoing Patient reports nasal congestion, coughing, rhinorrhea that is recurrent despite using steroid course and Zpack. Suspect allergic etiology at this time given presentation and recurrent nature of symptoms Recommend starting Zyrtec and stopping Flonase to help with reducing nasal irritation. Can continue to use nasal saline for moisturization and flushing. Follow up with PCP for persistent symptoms     Discharge Instructions      Restart your Cetirizine (zyrtec) to help with the nasal congestion and runny nose.  You can try stopping the Flonase to see if this helps with reducing the blood in your nose. Try just using the nasal saline sprays.      ED Prescriptions   None    PDMP not reviewed this encounter.   Roselind Messier 07/03/23 4098

## 2023-07-03 NOTE — Discharge Instructions (Signed)
Restart your Cetirizine (zyrtec) to help with the nasal congestion and runny nose.  You can try stopping the Flonase to see if this helps with reducing the blood in your nose. Try just using the nasal saline sprays.

## 2023-07-03 NOTE — ED Triage Notes (Signed)
Patient has been seen multiple times for the same symptoms. Cough, nasal congestion patient states he got better but symptoms have returned.

## 2023-07-17 ENCOUNTER — Other Ambulatory Visit: Payer: Medicare HMO

## 2023-07-17 ENCOUNTER — Telehealth: Payer: Self-pay

## 2023-07-17 ENCOUNTER — Other Ambulatory Visit: Payer: Self-pay

## 2023-07-17 DIAGNOSIS — Z79899 Other long term (current) drug therapy: Secondary | ICD-10-CM

## 2023-07-17 DIAGNOSIS — Z113 Encounter for screening for infections with a predominantly sexual mode of transmission: Secondary | ICD-10-CM

## 2023-07-17 DIAGNOSIS — B20 Human immunodeficiency virus [HIV] disease: Secondary | ICD-10-CM

## 2023-07-17 NOTE — Telephone Encounter (Signed)
Previous Dr Orvan Falconer patient, I have not seen him so far. I am not sure what is the reason for his disability. His HIV is well controlled and is not an indication by itself for disability.

## 2023-07-17 NOTE — Telephone Encounter (Signed)
Patient is moving and needs letter with letterhead stating he is disable from Dr. Elinor Parkinson. Best contact number is [phone] for patient to pick up.

## 2023-07-17 NOTE — Telephone Encounter (Signed)
Pt coming in to see you tomorrow to discuss letter.  Juanita Laster, RMA

## 2023-07-17 NOTE — Addendum Note (Signed)
Addended by: Harley Alto on: 07/17/2023 11:18 AM   Modules accepted: Orders

## 2023-07-18 ENCOUNTER — Other Ambulatory Visit: Payer: Self-pay

## 2023-07-18 ENCOUNTER — Ambulatory Visit (INDEPENDENT_AMBULATORY_CARE_PROVIDER_SITE_OTHER): Payer: Medicare HMO | Admitting: Infectious Diseases

## 2023-07-18 ENCOUNTER — Encounter: Payer: Self-pay | Admitting: Infectious Diseases

## 2023-07-18 VITALS — BP 135/76 | HR 75 | Temp 97.7°F | Wt 198.0 lb

## 2023-07-18 DIAGNOSIS — Z0000 Encounter for general adult medical examination without abnormal findings: Secondary | ICD-10-CM | POA: Diagnosis not present

## 2023-07-18 DIAGNOSIS — Z7185 Encounter for immunization safety counseling: Secondary | ICD-10-CM | POA: Insufficient documentation

## 2023-07-18 DIAGNOSIS — Z113 Encounter for screening for infections with a predominantly sexual mode of transmission: Secondary | ICD-10-CM | POA: Diagnosis not present

## 2023-07-18 DIAGNOSIS — B20 Human immunodeficiency virus [HIV] disease: Secondary | ICD-10-CM | POA: Insufficient documentation

## 2023-07-18 DIAGNOSIS — Z5181 Encounter for therapeutic drug level monitoring: Secondary | ICD-10-CM | POA: Diagnosis not present

## 2023-07-18 LAB — T-HELPER CELL (CD4) - (RCID CLINIC ONLY)
CD4 % Helper T Cell: 43 % (ref 33–65)
CD4 T Cell Abs: 478 /uL (ref 400–1790)

## 2023-07-18 MED ORDER — BIKTARVY 50-200-25 MG PO TABS: 1.0000 | ORAL_TABLET | Freq: Every day | ORAL | 11 refills | Status: DC

## 2023-07-18 NOTE — Addendum Note (Signed)
Addended by: Odette Fraction on: 07/18/2023 07:35 PM   Modules accepted: Orders

## 2023-07-18 NOTE — Progress Notes (Addendum)
[address], [place], [place], [address]                                                                  Phn. [phone]; Fax: [phone]                                                                             Date: 07/18/23  Reason for Visit: HIV follow up  HPI: Benjamin Hunter is a 63 y.o.old male with a history of HIV( previously on Truvada and Isentress> stribild since 08/21/2013>Genvoya (since 08/11/2014)> biktarvy ( since prior to 11/2017), HTN, HLD, DM, Depression, h/o COVID 19 who is here for HIV fu. Previously followed by Dr Orvan Falconer for a long time.   Compliant with Biktarvy with no issues regarding adherence or side effects.  Denies being sexually active and his last sexual activity, primarily with male partners, was years ago. He consumes beer occasionally, up to three cans at a time. Denies smoking and recreational drugs. He is retired and lives alone. He is in the process of moving residences to be closer to the doctor's office and asking for disability letter if he qualifies.   His HIV has been welll controlled for several years. Discussed with him about different clinical situations which qualifies for disability in an HIV like mental health concerns, HIV/AIDs related complications, treatment side effects etc  and he currently does not qualify. He acknowledged. He will fu with his PCP if any other conditions merit him to get disability.   He reports following with his PCP for chronic renal insufficiency. Reports having done colonoscopy in the past, follows [facility] as well as having received Flu, covid vaccine.   He reports a recent cold, which he attributes to being outside without a coat. He has some congestion and hoarseness, which he has been managing with  over-the-counter medications and steam inhalation as well as albuterol. Seen in the ED few times prior for similar complaints. No complaints otherwise.   ROS: Denies fevers, chills. Denies nausea, vomiting, abdominal pain, diarrhea/constipation, loss of appetite and weight loss. Denies SOB, cough and chest pain. Denies GU complaints. Denies recent hospitalizations. Denies rashes, joint complaints, headaches/neurological complaints                                      Current Outpatient Medications on File Prior to Visit  Medication Sig Dispense Refill   albuterol (VENTOLIN HFA) 108 (90 Base) MCG/ACT inhaler Inhale 1-2 puffs into the lungs every 6 (six) hours as needed for wheezing  or shortness of breath. 8 g 0   Alcohol Swabs PADS 1 each by Does not apply route 3 (three) times daily before meals. 90 each 11   amLODipine (NORVASC) 10 MG tablet Take 10 mg by mouth daily.     atorvastatin (LIPITOR) 80 MG tablet Take 80 mg by mouth daily.     B-D UF III MINI PEN NEEDLES 31G X 5 MM MISC USE TO INJECT AT BEDTIME 100 each 2   BD PEN NEEDLE MICRO U/F 32G X 6 MM MISC SMARTSIG:Injection Every Morning     Blood Glucose Monitoring Suppl (ONETOUCH VERIO) w/Device KIT USE AS DIRECTED 1 kit 0   cetirizine (ZYRTEC ALLERGY) 10 MG tablet Take 1 tablet (10 mg total) by mouth daily. 90 tablet 0   diclofenac sodium (VOLTAREN) 1 % GEL Apply 2 g topically 4 (four) times daily. 100 g 2   fenofibrate 160 MG tablet Take 160 mg by mouth daily.     fluticasone (FLONASE) 50 MCG/ACT nasal spray Place 2 sprays into both nostrils daily. 16 g 3   glipiZIDE (GLUCOTROL) 10 MG tablet TAKE 1 TABLET(10 MG) BY MOUTH TWICE DAILY BEFORE A MEAL 180 tablet 1   glucose blood (ONETOUCH VERIO) test strip Use as instructed 100 each 12   Guaifenesin 1200 MG TB12 Take 1 tablet (1,200 mg total) by mouth in the morning and at bedtime. 14 tablet 0   hydrocortisone (ANUSOL-HC) 2.5 % rectal cream Apply to rectal hemorrhoid BID PRN 30 g 0    hydrocortisone cream 1 % APP AA QID FOR 7 DAYS     hydrocortisone-pramoxine (ANALPRAM HC) 2.5-1 % rectal cream Place 1 application rectally 3 (three) times daily. 30 g 1   Insulin Glargine (LANTUS SOLOSTAR) 100 UNIT/ML Solostar Pen Inject 35 Units into the skin at bedtime. 15 mL 1   JARDIANCE 25 MG TABS tablet Take 25 mg by mouth daily.     Lancet Devices (ACCU-CHEK SOFTCLIX) lancets Use as instructed for 3 times daily testing of blood sugar. 1 each 0   methylPREDNISolone (MEDROL DOSEPAK) 4 MG TBPK tablet Take as directed 1 each 0   ONETOUCH DELICA LANCETS 33G MISC USE AS DIRECTED 100 each 11   sodium chloride (OCEAN) 0.65 % SOLN nasal spray Place 1 spray into both nostrils as needed for congestion. 88 mL 0   valACYclovir (VALTREX) 1000 MG tablet TAKE 1 TABLET(1000 MG) BY MOUTH TWICE DAILY 20 tablet 0   azithromycin (ZITHROMAX Z-PAK) 250 MG tablet Take 2 pills (500mg ) first day and one pill (250mg ) the remaining 4 days. (Patient not taking: Reported on 07/18/2023) 6 tablet 0   ipratropium-albuterol (DUONEB) 0.5-2.5 (3) MG/3ML SOLN Take 3 mLs by nebulization every 6 (six) hours as needed for up to 10 days. 120 mL 0   ipratropium-albuterol (DUONEB) 0.5-2.5 (3) MG/3ML SOLN Take 3 mLs by nebulization every 6 (six) hours as needed for up to 10 days. (Patient taking differently: Take 3 mLs by nebulization every 6 (six) hours as needed. Last used: 08-05-2022) 120 mL 0   loperamide (IMODIUM) 2 MG capsule Take 1 capsule (2 mg total) by mouth 2 (two) times daily as needed for diarrhea or loose stools. (Patient not taking: Reported on 07/18/2023) 14 capsule 0   triamcinolone cream (KENALOG) 0.1 % Apply 1 application topically 2 (two) times daily. (Patient not taking: Reported on 07/18/2023) 30 g 0   No current facility-administered medications on file prior to visit.   Allergies  Allergen Reactions   Shrimp [  Shellfish Allergy] Nausea And Vomiting   Lisinopril Rash    Palmar rash    Past Medical History:   Diagnosis Date   Arthritis    Depression    Diabetes mellitus    HIV (human immunodeficiency virus infection) (HCC)    Hyperlipidemia    Hypertension    No past surgical history on file.  Social History   Socioeconomic History   Marital status: Single    Spouse name: Not on file   Number of children: Not on file   Years of education: Not on file   Highest education level: Not on file  Occupational History   Not on file  Tobacco Use   Smoking status: Never   Smokeless tobacco: Never  Vaping Use   Vaping status: Never Used  Substance and Sexual Activity   Alcohol use: Yes    Alcohol/week: 2.0 standard drinks of alcohol    Types: 1 Cans of beer, 1 Shots of liquor per week    Comment: Last night. with 1 shot of liquor   Drug use: No   Sexual activity: Not Currently    Partners: Male, Male  Other Topics Concern   Not on file  Social History Narrative   Not on file   Social Drivers of Health   Financial Resource Strain: Not on file  Food Insecurity: Not on file  Transportation Needs: Not on file  Physical Activity: Not on file  Stress: Not on file  Social Connections: Not on file  Intimate Partner Violence: Not on file   Family History  Problem Relation Age of Onset   Hypertension Mother    Heart failure Father    Vitals BP 135/76   Pulse 75   Temp 97.7 F (36.5 C) (Oral)   Wt 198 lb (89.8 kg)   SpO2 100%   BMI 27.62 kg/m   Examination  Gen: Alert and oriented x 3, no acute distress HEENT: [place]/AT, no scleral icterus, no pale conjunctivae, hearing normal, oral mucosa moist Neck: Supple Cardio: Regular rate and rhythm; +S1 and S2 Resp: CTAB GI: nondistended GU: Musc: Extremities: No pedal edema Skin: No rashes Neuro: grossly non focal  Psych: Calm, cooperative  Lab Results HIV 1 RNA Quant  Date Value  08/09/2022 Not Detected Copies/mL  09/15/2021 NOT DETECTED copies/mL  09/28/2020 Not Detected Copies/mL   CD4 T Cell Abs (/uL)  Date Value   08/09/2022 634  09/28/2020 482  03/10/2020 496   No results found for: "HIV1GENOSEQ" Lab Results  Component Value Date   WBC 4.5 07/17/2023   HGB 11.8 (L) 07/17/2023   HCT 36.6 (L) 07/17/2023   MCV 90.1 07/17/2023   PLT 220 07/17/2023    Lab Results  Component Value Date   CREATININE 1.86 (H) 07/17/2023   BUN 29 (H) 07/17/2023   NA 138 07/17/2023   K 3.8 07/17/2023   CL 104 07/17/2023   CO2 25 07/17/2023   Lab Results  Component Value Date   ALT 23 07/17/2023   AST 28 07/17/2023   ALKPHOS 67 04/10/2020   BILITOT 0.6 07/17/2023    Lab Results  Component Value Date   CHOL 164 07/17/2023   TRIG 101 07/17/2023   HDL 50 07/17/2023   LDLCALC 95 07/17/2023   Lab Results  Component Value Date   HAV POS (A) 11/27/2007   Lab Results  Component Value Date   HEPBSAG NEG 11/27/2007   HEPBSAB NEG 11/27/2007   Lab Results  Component Value Date  HCVAB NEGATIVE 10/06/2011   No results found for: "CHLAMYDIAWP", "N" No results found for: "GCPROBEAPT" No results found for: "QUANTGOLD"  Health Maintenance: Immunization History  Administered Date(s) Administered   Hepatitis B 08/22/2002, 10/13/2002, 03/17/2003   Influenza Inj Mdck Quad Pf 02/13/2019   Influenza Split 04/25/2011, 02/16/2012   Influenza Whole 03/06/2008, 03/16/2009, 01/25/2010   Influenza,inj,Quad PF,6+ Mos 05/02/2013, 02/28/2014, 02/24/2015, 02/17/2016, 03/13/2017, 02/27/2018   Moderna Sars-Covid-2 Vaccination 08/03/2019, 09/02/2019   Pneumococcal Polysaccharide-23 11/26/2007, 10/11/2012, 07/04/2017   Tdap 07/04/2017       Assessment/Plan: # HIV - continue Biktarvy, medications refilled  - labs from 2/17 discussed,some of which were still pending during encounter - fu in 5-6 months   # STD Screening  - RPR NR - Urine GC today   # HTN,DM2, chronic renal insufficiency, HLD - Follow up with PCP  #Immunization  - Reports receiving Flu and COVID  - HBV vaccine series  and PCV 20 vaccine next  visit  #Health maintenance Quantiferon next visit Dental Care discussed  Colonoscopy discussed    Patient's labs were reviewed as well as his previous records. Patients questions were addressed and answered. Safe sex counseling done.  I have personally spent 40 minutes involved in face-to-face and non-face-to-face activities for this patient on the day of the visit. Professional time spent includes the following activities: Preparing to see the patient (review of tests), Obtaining and/or reviewing separately obtained history (admission/discharge record), Performing a medically appropriate examination and/or evaluation , Ordering medications/tests/procedures, referring and communicating with other health care professionals, Documenting clinical information in the EMR, Independently interpreting results (not separately reported), Communicating results to the patient/family/caregiver, Counseling and educating the patient/family/caregiver and Care coordination (not separately reported).   Of note, portions of this note may have been created with voice recognition software. While this note has been edited for accuracy, occasional wrong-word or 'sound-a-like' substitutions may have occurred due to the inherent limitations of voice recognition software.   Electronically signed by:  Odette Fraction, MD Infectious Disease Physician [facility] [address] [place], [place] [address] Phone: [phone]  Fax: [phone]

## 2023-07-19 ENCOUNTER — Encounter (HOSPITAL_COMMUNITY): Payer: Self-pay | Admitting: *Deleted

## 2023-07-19 ENCOUNTER — Ambulatory Visit (HOSPITAL_COMMUNITY)
Admission: EM | Admit: 2023-07-19 | Discharge: 2023-07-19 | Disposition: A | Discharge status: Home / Self Care | Payer: Medicare HMO

## 2023-07-19 ENCOUNTER — Other Ambulatory Visit: Payer: Self-pay

## 2023-07-19 DIAGNOSIS — J309 Allergic rhinitis, unspecified: Secondary | ICD-10-CM | POA: Diagnosis not present

## 2023-07-19 LAB — CBC WITH DIFFERENTIAL/PLATELET
Absolute Lymphocytes: 1103 {cells}/uL (ref 850–3900)
Absolute Monocytes: 383 {cells}/uL (ref 200–950)
Basophils Absolute: 41 {cells}/uL (ref 0–200)
Basophils Relative: 0.9 %
Eosinophils Absolute: 81 {cells}/uL (ref 15–500)
Eosinophils Relative: 1.8 %
HCT: 36.6 % — ABNORMAL LOW (ref 38.5–50.0)
Hemoglobin: 11.8 g/dL — ABNORMAL LOW (ref 13.2–17.1)
MCH: 29.1 pg (ref 27.0–33.0)
MCHC: 32.2 g/dL (ref 32.0–36.0)
MCV: 90.1 fL (ref 80.0–100.0)
MPV: 12.1 fL (ref 7.5–12.5)
Monocytes Relative: 8.5 %
Neutro Abs: 2894 {cells}/uL (ref 1500–7800)
Neutrophils Relative %: 64.3 %
Platelets: 220 10*3/uL (ref 140–400)
RBC: 4.06 10*6/uL — ABNORMAL LOW (ref 4.20–5.80)
RDW: 12.1 % (ref 11.0–15.0)
Total Lymphocyte: 24.5 %
WBC: 4.5 10*3/uL (ref 3.8–10.8)

## 2023-07-19 LAB — COMPLETE METABOLIC PANEL WITH GFR
AG Ratio: 1.4 (calc) (ref 1.0–2.5)
ALT: 23 U/L (ref 9–46)
AST: 28 U/L (ref 10–35)
Albumin: 4.7 g/dL (ref 3.6–5.1)
Alkaline phosphatase (APISO): 58 U/L (ref 35–144)
BUN/Creatinine Ratio: 16 (calc) (ref 6–22)
BUN: 29 mg/dL — ABNORMAL HIGH (ref 7–25)
CO2: 25 mmol/L (ref 20–32)
Calcium: 9.6 mg/dL (ref 8.6–10.3)
Chloride: 104 mmol/L (ref 98–110)
Creat: 1.86 mg/dL — ABNORMAL HIGH (ref 0.70–1.35)
Globulin: 3.3 g/dL (ref 1.9–3.7)
Glucose, Bld: 114 mg/dL — ABNORMAL HIGH (ref 65–99)
Potassium: 3.8 mmol/L (ref 3.5–5.3)
Sodium: 138 mmol/L (ref 135–146)
Total Bilirubin: 0.6 mg/dL (ref 0.2–1.2)
Total Protein: 8 g/dL (ref 6.1–8.1)
eGFR: 40 mL/min/{1.73_m2} — ABNORMAL LOW (ref >=60)

## 2023-07-19 LAB — LIPID PANEL
Cholesterol: 164 mg/dL (ref <200)
HDL: 50 mg/dL (ref >=40)
LDL Cholesterol (Calc): 95 mg/dL
Non-HDL Cholesterol (Calc): 114 mg/dL (ref <130)
Total CHOL/HDL Ratio: 3.3 (calc) (ref <5.0)
Triglycerides: 101 mg/dL (ref <150)

## 2023-07-19 LAB — RPR: RPR Ser Ql: NONREACTIVE

## 2023-07-19 LAB — HIV-1 RNA QUANT-NO REFLEX-BLD
HIV 1 RNA Quant: NOT DETECTED {copies}/mL
HIV-1 RNA Quant, Log: NOT DETECTED {Log}

## 2023-07-19 LAB — C. TRACHOMATIS/N. GONORRHOEAE RNA
C. trachomatis RNA, TMA: NOT DETECTED
N. gonorrhoeae RNA, TMA: NOT DETECTED

## 2023-07-19 NOTE — ED Provider Notes (Signed)
[facility]    CSN: [id number] Arrival date & time: 07/19/23  4098      History   Chief Complaint Chief Complaint  Patient presents with   Nasal Congestion    HPI Benjamin Hunter is a 63 y.o. male.   Patient presents with nasal congestion and runny nose x 4 days. Patient states that he went outside without a coat a few days ago and is now requesting antibiotics for congestion. Denies fever, sinus pain, sinus pressure, cough, shortness of breath, and chest pain.      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 No pertinent surgical history.     Home Medications    Prior to Admission medications   Medication Sig Start Date End Date Taking? Authorizing Provider  albuterol (VENTOLIN HFA) 108 (90 Base) MCG/ACT inhaler Inhale 1-2 puffs into the lungs every 6 (six) hours as needed for wheezing or shortness of breath. 07/25/22  Yes  Carlisle Beers, FNP  Alcohol Swabs PADS 1 each by Does not apply route 3 (three) times daily before meals. 11/13/18  Yes Hoy Register, MD  amLODipine (NORVASC) 10 MG tablet Take 10 mg by mouth daily. 07/09/22   [provider]  atorvastatin (LIPITOR) 80 MG tablet Take 80 mg by mouth daily. 07/12/22   [provider]  azithromycin (ZITHROMAX Z-PAK) 250 MG tablet Take 2 pills (500mg ) first day and one pill (250mg ) the remaining 4 days. Patient not taking: Reported on 07/18/2023 06/12/23   Wynonia Lawman A, NP  B-D UF III MINI PEN NEEDLES 31G X 5 MM MISC USE TO INJECT AT BEDTIME 06/21/18   Hoy Register, MD  BD PEN NEEDLE MICRO U/F 32G X 6 MM MISC SMARTSIG:Injection Every Morning 05/15/22   [provider]  bictegravir-emtricitabine-tenofovir AF (BIKTARVY) 50-200-25 MG TABS tablet Take 1 tablet by mouth daily. 07/18/23   Odette Fraction, MD  Blood Glucose Monitoring Suppl [facility] VERIO) w/Device KIT USE AS DIRECTED 03/14/18   Hoy Register, MD  cetirizine (ZYRTEC ALLERGY) 10 MG tablet Take 1 tablet (10 mg total) by mouth daily. 08/19/20   Wallis Bamberg, PA-C  diclofenac sodium (VOLTAREN) 1 % GEL Apply 2 g topically 4 (four) times daily. 09/08/18   Elvina Sidle, MD  fenofibrate 160 MG tablet Take 160 mg by mouth daily. 07/13/21   [provider]  fluticasone (FLONASE) 50 MCG/ACT nasal spray Place 2 sprays into both nostrils daily. 11/13/18   Hoy Register, MD  glipiZIDE (GLUCOTROL) 10 MG tablet TAKE 1 TABLET(10 MG) BY MOUTH TWICE DAILY BEFORE A MEAL 11/13/18   Hoy Register, MD  glucose blood (ONETOUCH VERIO) test strip Use as instructed 03/14/18   Hoy Register, MD  Guaifenesin 1200 MG TB12 Take 1 tablet (1,200 mg total) by mouth in the morning and at bedtime. 10/23/21   Carlisle Beers, FNP  hydrocortisone (ANUSOL-HC) 2.5 % rectal cream Apply to rectal hemorrhoid BID PRN 08/20/18   Marcine Matar, MD  hydrocortisone cream 1 % APP AA QID FOR  7 DAYS 05/21/19   [provider]  hydrocortisone-pramoxine (ANALPRAM HC) 2.5-1 % rectal cream Place 1 application rectally 3 (three) times daily. 08/14/18   Hoy Register, MD  Insulin Glargine (LANTUS SOLOSTAR) 100 UNIT/ML Solostar Pen Inject 35 Units into the skin at bedtime. 03/29/19   Hoy Register, MD  ipratropium-albuterol (DUONEB) 0.5-2.5 (3) MG/3ML SOLN Take 3 mLs by nebulization every 6 (six) hours as needed for up to 10 days. 07/29/22 08/30/22  Garrison, [place] N, FNP  ipratropium-albuterol (DUONEB) 0.5-2.5 (3) MG/3ML SOLN Take 3 mLs by nebulization every 6 (six) hours as needed for up to 10 days. Patient taking differently: Take 3 mLs by nebulization every 6 (six) hours as needed. Last used: 08-05-2022 07/29/22 08/08/22  Garrison, [place] N, FNP  JARDIANCE 25 MG TABS tablet Take 25 mg by mouth daily. 07/06/23   [provider]  Lancet Devices [facility]) lancets Use as instructed for 3 times daily testing of blood sugar. 12/07/15   Hoy Register, MD  loperamide (IMODIUM) 2 MG capsule Take 1 capsule (2 mg total) by mouth 2 (two) times daily as needed for diarrhea or loose stools. Patient not taking: Reported on 07/18/2023 11/16/20   Wallis Bamberg, PA-C  methylPREDNISolone (MEDROL DOSEPAK) 4 MG TBPK tablet Take as directed 06/08/23   Jannifer Sonny Anthes, MD  [facility] DELICA LANCETS 33G MISC USE AS DIRECTED 03/14/18   Hoy Register, MD  sodium chloride (OCEAN) 0.65 % SOLN nasal spray Place 1 spray into both nostrils as needed for congestion. 02/07/20   Darr, Gerilyn Pilgrim, PA-C  triamcinolone cream (KENALOG) 0.1 % Apply 1 application topically 2 (two) times daily. Patient not taking: Reported on 07/18/2023 09/20/20   Particia Nearing, PA-C  valACYclovir (VALTREX) 1000 MG tablet TAKE 1 TABLET(1000 MG) BY MOUTH TWICE DAILY 10/01/18   Hoy Register, MD    Family History Family History  Problem Relation Age of Onset   Hypertension Mother    Heart failure Father     Social  History Social History   Tobacco Use   Smoking status: Never   Smokeless tobacco: Never  Vaping Use   Vaping status: Never Used  Substance Use Topics   Alcohol use: Yes    Alcohol/week: 2.0 standard drinks of alcohol    Types: 1 Cans of beer, 1 Shots of liquor per week    Comment: Last night. with 1 shot of liquor   Drug use: No     Allergies   Shrimp [shellfish allergy] and Lisinopril   Review of Systems Review of Systems  Per HPI  Physical Exam Triage Vital Signs ED Triage Vitals  Encounter Vitals Group     BP 07/19/23 1054 126/68     Systolic BP Percentile --      Diastolic BP Percentile --      Pulse Rate 07/19/23 1054 74     Resp 07/19/23 1054 18     Temp 07/19/23 1054 98.1 F (36.7 C)  Temp src --      SpO2 07/19/23 1054 95 %     Weight --      Height --      Head Circumference --      Peak Flow --      Pain Score 07/19/23 1053 0     Pain Loc --      Pain Education --      Exclude from Growth Chart --    No data found.  Updated Vital Signs BP 126/68   Pulse 74   Temp 98.1 F (36.7 C)   Resp 18   SpO2 95%   Visual Acuity Right Eye Distance:   Left Eye Distance:   Bilateral Distance:    Right Eye Near:   Left Eye Near:    Bilateral Near:     Physical Exam Vitals and nursing note reviewed.  Constitutional:      General: He is awake. He is not in acute distress.    Appearance: Normal appearance. He is well-developed and well-groomed. He is not ill-appearing.  HENT:     Right Ear: Tympanic membrane, ear canal and external ear normal.     Left Ear: Tympanic membrane, ear canal and external ear normal.     Nose: Congestion and rhinorrhea present.     Right Sinus: No maxillary sinus tenderness or frontal sinus tenderness.     Left Sinus: No maxillary sinus tenderness or frontal sinus tenderness.     Mouth/Throat:     Mouth: Mucous membranes are moist.     Pharynx: Oropharynx is clear.  Cardiovascular:     Rate and Rhythm: Normal rate  and regular rhythm.  Pulmonary:     Effort: Pulmonary effort is normal.     Breath sounds: Normal breath sounds.  Skin:    General: Skin is warm and dry.  Neurological:     Mental Status: He is alert.  Psychiatric:        Behavior: Behavior is cooperative.      UC Treatments / Results  Labs (all labs ordered are listed, but only abnormal results are displayed) Labs Reviewed - No data to display  EKG   Radiology No results found.  Procedures Procedures (including critical care time)  Medications Ordered in UC Medications - No data to display  Initial Impression / Assessment and Plan / UC Course  I have reviewed the triage vital signs and the nursing notes.  Pertinent labs & imaging results that were available during my care of the patient were reviewed by me and considered in my medical decision making (see chart for details).     Patient presented with 4-day history of nasal congestion and runny nose.  Upon assessment congestion and clear rhinorrhea are present. Lungs clear bilaterally on auscultation.  Patient has been seen multiple times for similar symptoms and has been told to use Flonase and take a daily allergy medication for symptoms. Patient states he has been using Flonase occasionally, but denies taking daily allergy medication.   Recommended patient consistently use Flonase daily and a daily allergy medication once daily to help with symptoms. Discussed follow-up and return precautions.   Final Clinical Impressions(s) / UC Diagnoses   Final diagnoses:  Allergic rhinitis, unspecified seasonality, unspecified trigger     Discharge Instructions      I recommend continuing Flonase once daily to help with your congestion. Start taking a daily allergy medication like Zyrtec or Claritin daily to help with your symptoms. Follow-up with  primary care or return here as needed.     ED Prescriptions   None    PDMP not reviewed this encounter.   Wynonia Lawman A, NP 07/19/23 [id number]

## 2023-07-19 NOTE — ED Triage Notes (Signed)
PT reports he went out side with out a coat and is  now congested.

## 2023-07-19 NOTE — Discharge Instructions (Signed)
I recommend continuing Flonase once daily to help with your congestion. Start taking a daily allergy medication like Zyrtec or Claritin daily to help with your symptoms. Follow-up with primary care or return here as needed.

## 2023-07-20 ENCOUNTER — Telehealth: Payer: Self-pay

## 2023-07-20 NOTE — Telephone Encounter (Signed)
Patient called back regarding missed call. Review results with him. Is working with pcp.  Juanita Laster, RMA

## 2023-07-20 NOTE — Telephone Encounter (Signed)
-----   Message from Victoriano Lain sent at 07/20/2023 10:25 AM EST ----- Please let patient know lab work is negative for any acute abnormality.   He is virally undetectable He has some gradual elevation in creatinine, he has a known h/o chronic renal insufficiency and should fu with his PCP regarding it. Ok to continue [employer].

## 2023-07-20 NOTE — Telephone Encounter (Signed)
Attempted to call patient regarding results. Not able to reach her at this time. Voicemail not setup. Will send mychart message. Juanita Laster, RMA

## 2023-07-22 NOTE — Addendum Note (Signed)
 Addended by: Odette Fraction on: 07/22/2023 08:27 AM   Modules accepted: Level of Service

## 2023-07-27 ENCOUNTER — Encounter (HOSPITAL_COMMUNITY): Payer: Self-pay

## 2023-07-27 ENCOUNTER — Ambulatory Visit (HOSPITAL_COMMUNITY)
Admission: EM | Admit: 2023-07-27 | Discharge: 2023-07-27 | Disposition: A | Discharge status: Home / Self Care | Payer: Medicare HMO | Attending: Physician Assistant | Admitting: Physician Assistant

## 2023-07-27 DIAGNOSIS — H938X3 Other specified disorders of ear, bilateral: Secondary | ICD-10-CM

## 2023-07-27 MED ORDER — RYALTRIS 665-25 MCG/ACT NA SUSP: 2.0000 | Freq: Two times a day (BID) | NASAL | 0 refills | Status: AC

## 2023-07-27 NOTE — ED Provider Notes (Signed)
 [facility]    CSN: [id number] Arrival date & time: 07/27/23  1249      History   Chief Complaint Chief Complaint  Patient presents with   Otalgia    HPI Benjamin Hunter is a 63 y.o. male.   HPI   He reports that he developed some dizziness this AM  He states usually shaking his head helps relieve this and popping his ears but today his ears felt more full after popping them  He states he also feels like his hearing is off- states sounds are echoing and muffled He states he has had some congestion and allergies symptoms for the last 3 months - he states he is taking Zyrtec  He denies a show asymmetry, difficulty speaking, weakness on one side of his body, numbness or tingling.  He denies headaches, nausea, vomiting   Past Medical History:  Diagnosis Date   Arthritis    Depression    Diabetes mellitus    HIV (human immunodeficiency virus infection) (HCC)    Hyperlipidemia    Hypertension     Patient Active Problem List   Diagnosis Date Noted   HIV disease (HCC) 07/18/2023   Medication monitoring encounter 07/18/2023   Health care maintenance 07/18/2023   Screening for STDs (sexually transmitted diseases) 07/18/2023   Immunization counseling 07/18/2023   CRI (chronic renal insufficiency) 08/30/2022   Bronchitis 11/23/2021   History of COVID-19 01/22/2020   Right leg pain 05/15/2019   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 No pertinent surgical history.     Home Medications    Prior to  Admission medications   Medication Sig Start Date End Date Taking? Authorizing Provider  Olopatadine-Mometasone (RYALTRIS) [phone] MCG/ACT SUSP Place 2 puffs into the nose in the morning and at bedtime. 07/27/23  Yes Tarek Cravens E, PA-C  albuterol (VENTOLIN HFA) 108 (90 Base) MCG/ACT inhaler Inhale 1-2 puffs into the lungs every 6 (six) hours as needed for wheezing or shortness of breath. 07/25/22   Carlisle Beers, FNP  Alcohol Swabs PADS 1 each by Does not apply route 3 (three) times daily before meals. 11/13/18   Hoy Register, MD  amLODipine (NORVASC) 10 MG tablet Take 10 mg by mouth daily. 07/09/22   [provider]  atorvastatin (LIPITOR) 80 MG tablet Take 80 mg by mouth daily. 07/12/22   [provider]  azithromycin (ZITHROMAX Z-PAK) 250 MG tablet Take 2 pills (500mg ) first day and one pill (250mg ) the remaining 4 days. Patient not taking: Reported on 07/18/2023 06/12/23   Wynonia Lawman A, NP  B-D UF III MINI PEN NEEDLES 31G X 5 MM MISC USE TO INJECT AT BEDTIME 06/21/18   Hoy Register, MD  BD PEN NEEDLE MICRO U/F 32G X 6 MM MISC SMARTSIG:Injection Every Morning 05/15/22   [provider]  bictegravir-emtricitabine-tenofovir AF (BIKTARVY) 50-200-25 MG TABS tablet Take 1 tablet by mouth daily. 07/18/23   Odette Fraction, MD  Blood Glucose Monitoring Suppl [facility] VERIO) w/Device KIT USE AS DIRECTED 03/14/18   Hoy Register, MD  cetirizine (ZYRTEC ALLERGY)  10 MG tablet Take 1 tablet (10 mg total) by mouth daily. 08/19/20   Wallis Bamberg, PA-C  diclofenac sodium (VOLTAREN) 1 % GEL Apply 2 g topically 4 (four) times daily. 09/08/18   Elvina Sidle, MD  fenofibrate 160 MG tablet Take 160 mg by mouth daily. 07/13/21   [provider]  glipiZIDE (GLUCOTROL) 10 MG tablet TAKE 1 TABLET(10 MG) BY MOUTH TWICE DAILY BEFORE A MEAL 11/13/18   Hoy Register, MD  glucose blood (ONETOUCH VERIO) test strip Use as instructed 03/14/18   Hoy Register, MD   Guaifenesin 1200 MG TB12 Take 1 tablet (1,200 mg total) by mouth in the morning and at bedtime. 10/23/21   Carlisle Beers, FNP  hydrocortisone (ANUSOL-HC) 2.5 % rectal cream Apply to rectal hemorrhoid BID PRN 08/20/18   Marcine Matar, MD  hydrocortisone cream 1 % APP AA QID FOR 7 DAYS 05/21/19   [provider]  hydrocortisone-pramoxine (ANALPRAM HC) 2.5-1 % rectal cream Place 1 application rectally 3 (three) times daily. 08/14/18   Hoy Register, MD  Insulin Glargine (LANTUS SOLOSTAR) 100 UNIT/ML Solostar Pen Inject 35 Units into the skin at bedtime. 03/29/19   Hoy Register, MD  ipratropium-albuterol (DUONEB) 0.5-2.5 (3) MG/3ML SOLN Take 3 mLs by nebulization every 6 (six) hours as needed for up to 10 days. 07/29/22 08/30/22  Garrison, [place] N, FNP  ipratropium-albuterol (DUONEB) 0.5-2.5 (3) MG/3ML SOLN Take 3 mLs by nebulization every 6 (six) hours as needed for up to 10 days. Patient taking differently: Take 3 mLs by nebulization every 6 (six) hours as needed. Last used: 08-05-2022 07/29/22 08/08/22  Garrison, [place] N, FNP  JARDIANCE 25 MG TABS tablet Take 25 mg by mouth daily. 07/06/23   [provider]  Lancet Devices [facility] Novamed) lancets Use as instructed for 3 times daily testing of blood sugar. 12/07/15   Hoy Register, MD  loperamide (IMODIUM) 2 MG capsule Take 1 capsule (2 mg total) by mouth 2 (two) times daily as needed for diarrhea or loose stools. Patient not taking: Reported on 07/18/2023 11/16/20   Wallis Bamberg, PA-C  methylPREDNISolone (MEDROL DOSEPAK) 4 MG TBPK tablet Take as directed 06/08/23   Jannifer Franklin, MD  [facility] DELICA LANCETS 33G MISC USE AS DIRECTED 03/14/18   Hoy Register, MD  sodium chloride (OCEAN) 0.65 % SOLN nasal spray Place 1 spray into both nostrils as needed for congestion. 02/07/20   Darr, Gerilyn Pilgrim, PA-C  triamcinolone cream (KENALOG) 0.1 % Apply 1 application topically 2 (two) times daily. Patient not taking: Reported on  07/18/2023 09/20/20   Particia Nearing, PA-C  valACYclovir (VALTREX) 1000 MG tablet TAKE 1 TABLET(1000 MG) BY MOUTH TWICE DAILY 10/01/18   Hoy Register, MD    Family History Family History  Problem Relation Age of Onset   Hypertension Mother    Heart failure Father     Social History Social History   Tobacco Use   Smoking status: Never   Smokeless tobacco: Never  Vaping Use   Vaping status: Never Used  Substance Use Topics   Alcohol use: Yes    Alcohol/week: 2.0 standard drinks of alcohol    Types: 1 Cans of beer, 1 Shots of liquor per week    Comment: Last night. with 1 shot of liquor   Drug use: No     Allergies   Shrimp [shellfish allergy] and Lisinopril   Review of Systems Review of Systems  Constitutional:  Negative for chills and fever.  HENT:  Positive for congestion  and ear pain. Negative for ear discharge and sore throat.   Eyes:  Negative for visual disturbance.  Respiratory:  Negative for cough and shortness of breath.   Gastrointestinal:  Negative for diarrhea, nausea and vomiting.  Musculoskeletal:  Negative for myalgias.  Neurological:  Positive for dizziness. Negative for facial asymmetry, speech difficulty, weakness, numbness and headaches.  Psychiatric/Behavioral:  Negative for confusion.      Physical Exam Triage Vital Signs ED Triage Vitals  Encounter Vitals Group     BP 07/27/23 1437 (!) 144/83     Systolic BP Percentile --      Diastolic BP Percentile --      Pulse Rate 07/27/23 1437 71     Resp 07/27/23 1437 18     Temp 07/27/23 1437 98 F (36.7 C)     Temp src --      SpO2 07/27/23 1437 97 %     Weight --      Height --      Head Circumference --      Peak Flow --      Pain Score 07/27/23 1436 5     Pain Loc --      Pain Education --      Exclude from Growth Chart --    No data found.  Updated Vital Signs BP (!) 144/83   Pulse 71   Temp 98 F (36.7 C)   Resp 18   SpO2 97%   Visual Acuity Right Eye Distance:    Left Eye Distance:   Bilateral Distance:    Right Eye Near:   Left Eye Near:    Bilateral Near:     Physical Exam Vitals reviewed.  Constitutional:      General: He is awake.     Appearance: Normal appearance. He is well-developed and well-groomed.  HENT:     Head: Normocephalic and atraumatic.     Right Ear: Hearing, tympanic membrane and ear canal normal.     Left Ear: Hearing, tympanic membrane and ear canal normal.     Mouth/Throat:     Lips: Pink.     Mouth: Mucous membranes are moist.     Pharynx: Oropharynx is clear. Uvula midline.  Musculoskeletal:     Cervical back: Normal range of motion and neck supple.  Lymphadenopathy:     Head:     Right side of head: No submental, submandibular, preauricular or posterior auricular adenopathy.     Left side of head: No submental, submandibular, preauricular or posterior auricular adenopathy.     Cervical:     Right cervical: No superficial cervical adenopathy.    Left cervical: No superficial cervical adenopathy.     Upper Body:     Right upper body: No supraclavicular adenopathy.     Left upper body: No supraclavicular adenopathy.  Neurological:     General: No focal deficit present.     Mental Status: He is alert and oriented to person, place, and time.     GCS: GCS eye subscore is 4. GCS verbal subscore is 5. GCS motor subscore is 6.     Cranial Nerves: No cranial nerve deficit, dysarthria or facial asymmetry.     Motor: No weakness, tremor, atrophy or abnormal muscle tone.     Gait: Gait is intact.  Psychiatric:        Behavior: Behavior is cooperative.      UC Treatments / Results  Labs (all labs ordered are listed, but only abnormal results  are displayed) Labs Reviewed - No data to display  EKG   Radiology No results found.  Procedures Procedures (including critical care time)  Medications Ordered in UC Medications - No data to display  Initial Impression / Assessment and Plan / UC Course  I have  reviewed the triage vital signs and the nursing notes.  Pertinent labs & imaging results that were available during my care of the patient were reviewed by me and considered in my medical decision making (see chart for details).      Final Clinical Impressions(s) / UC Diagnoses   Final diagnoses:  Ear fullness, bilateral  Acute, new concern Patient reports that for the past week or so his ears have felt full.  He denies having ear drainage, fever, swelling, severe headache, nausea, vomiting.  He does report a limited instance of dizziness earlier today.  At this time I suspect that he likely has bilateral eustachian tube dysfunction.  Reviewed that management for this is typically comprised of an antihistamine as well as a nasal spray to relieve nasal congestion and sinus pressure.  He is already using Zyrtec daily and Flonase.  Will try changing this to Ryaltris nasal spray.  Recommend switching Zyrtec over to Claritin or Allegra for improved relief.  Recommend follow-up with primary care provider if he is not improving.  ED and return precautions were reviewed and provided in after visit summary.  Follow-up as needed for progressing or persistent symptoms    Discharge Instructions      At this time I recommend changing your Zyrtec to Claritin or Allegra.  I have sent in a new nasal spray called Ryaltris for you to try.  This medication should help with your nasal congestion.  I suspect that you likely have something called eustachian tube dysfunction which is causing the ear fullness symptoms.  Reducing nasal congestion and pressure can usually help relieve the symptoms and allow for normal hearing and improve dizziness symptoms  If your symptoms are persistent, start to worsen, he starts having more severe ear pain, fever, drainage from your ears please follow-up here or go to the emergency room.  If you start having numbness, tingling, weakness on one side of your body, facial drooping,  difficulty speaking, severe headache please go to the emergency room immediately for further evaluation and management.      ED Prescriptions     Medication Sig Dispense Auth. Provider   Olopatadine-Mometasone (RYALTRIS) [phone] MCG/ACT SUSP Place 2 puffs into the nose in the morning and at bedtime. 29 g Azharia Surratt E, PA-C      PDMP not reviewed this encounter.   Providence Crosby, PA-C 07/27/23 1528

## 2023-07-27 NOTE — ED Triage Notes (Addendum)
 Pt presents with complaints of otalgia bilaterally and having ear popping feelings in both ears this morning. He feels like both ears his hearing is decreased or stopped up. This morning he felt dizzy for approximately 5 seconds.

## 2023-07-27 NOTE — Discharge Instructions (Addendum)
 At this time I recommend changing your Zyrtec to Claritin or Allegra.  I have sent in a new nasal spray called Ryaltris for you to try.  This medication should help with your nasal congestion.  I suspect that you likely have something called eustachian tube dysfunction which is causing the ear fullness symptoms.  Reducing nasal congestion and pressure can usually help relieve the symptoms and allow for normal hearing and improve dizziness symptoms  If your symptoms are persistent, start to worsen, he starts having more severe ear pain, fever, drainage from your ears please follow-up here or go to the emergency room.  If you start having numbness, tingling, weakness on one side of your body, facial drooping, difficulty speaking, severe headache please go to the emergency room immediately for further evaluation and management.

## 2023-09-05 ENCOUNTER — Other Ambulatory Visit: Payer: Medicare HMO

## 2023-09-19 ENCOUNTER — Encounter: Payer: Self-pay | Admitting: Infectious Diseases

## 2023-09-22 ENCOUNTER — Ambulatory Visit: Payer: Medicare HMO | Admitting: Infectious Diseases

## 2023-10-16 NOTE — Progress Notes (Signed)
 The 10-year ASCVD risk score (Arnett DK, et al., 2019) is: 30.6%   Values used to calculate the score:     Age: 63 years     Sex: Male     Is Non-Hispanic African American: Yes     Diabetic: Yes     Tobacco smoker: No     Systolic Blood Pressure: 144 mmHg     Is BP treated: Yes     HDL Cholesterol: 50 mg/dL     Total Cholesterol: 164 mg/dL  Currently prescribed atorvastatin  80 mg.   Etienne Mowers, BSN, RN

## 2023-10-20 ENCOUNTER — Ambulatory Visit (HOSPITAL_COMMUNITY): Admission: EM | Admit: 2023-10-20 | Discharge: 2023-10-20 | Disposition: A | Discharge status: Home / Self Care

## 2023-10-20 ENCOUNTER — Encounter (HOSPITAL_COMMUNITY): Payer: Self-pay | Admitting: *Deleted

## 2023-10-20 DIAGNOSIS — R519 Headache, unspecified: Secondary | ICD-10-CM

## 2023-10-20 MED ORDER — ACETAMINOPHEN 325 MG PO TABS
ORAL_TABLET | ORAL | Status: AC
  Filled 2023-10-20: qty 2

## 2023-10-20 MED ORDER — ACETAMINOPHEN 325 MG PO TABS
650.0000 mg | ORAL_TABLET | Freq: Once | ORAL | Status: AC
  Administered 2023-10-20: 650 mg via ORAL

## 2023-10-20 MED ORDER — DEXAMETHASONE SODIUM PHOSPHATE 10 MG/ML IJ SOLN
INTRAMUSCULAR | Status: AC
  Filled 2023-10-20: qty 1

## 2023-10-20 MED ORDER — DEXAMETHASONE SODIUM PHOSPHATE 10 MG/ML IJ SOLN
10.0000 mg | Freq: Once | INTRAMUSCULAR | Status: AC
  Administered 2023-10-20: 10 mg via INTRAMUSCULAR

## 2023-10-20 NOTE — ED Triage Notes (Signed)
 Pt states he has had a headache X 1.5 week. He states he has taken tylenol  OTC for some relief, last dose was 3:30 am.  He is worried his headache is a sign of dementia and he would like a blood test for that. He states he has a appt with PCP in June.    He took a covid at home test this morning and it was neg.

## 2023-10-20 NOTE — ED Provider Notes (Signed)
 [facility]    CSN: [id number] Arrival date & time: 10/20/23  0815      History   Chief Complaint Chief Complaint  Patient presents with   Headache    HPI Benjamin Hunter is a 63 y.o. male.   Patient presents to clinic over concerns of ongoing headache for the past week and a half.  This has been ongoing and intermittent, happens every day.  Usually responsive to Tylenol  but then it returns.  Frontal headache as well as the top of his head.  Patient concerned that this might be a sign of early dementia or Alzheimer's.  He is also concerned about his A1c.  He does have history of type 2 diabetes and has been taking Jardiance intermittently.  CBG this morning was 150, reports this is normal for him.  Has not had any photophobia or phonophobia.  Without nausea, vomiting or diarrhea.  Denies vision changes.  On 2/17 creatinine was 1.86. HIV well-controlled, follows with ID.   The history is provided by the patient and medical records.  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 No pertinent surgical history.     Home Medications    Prior to Admission medications   Medication Sig Start Date End Date Taking? Authorizing Provider  amLODipine  (NORVASC ) 10 MG tablet Take 10 mg by mouth daily. 07/09/22  Yes [provider]  atorvastatin  (LIPITOR) 80 MG tablet Take 80 mg by mouth daily. 07/12/22  Yes [provider]  bictegravir-emtricitabine -tenofovir  AF (BIKTARVY ) 50-200-25 MG TABS tablet Take 1 tablet by mouth daily. 07/18/23  Yes Manandhar, Sabina, MD  ezetimibe  (ZETIA ) 10 MG tablet Take 10 mg by mouth daily. 09/30/23  Yes [provider]  fenofibrate  160 MG tablet Take 160 mg by mouth daily. 07/13/21  Yes [provider]  glipiZIDE  (GLUCOTROL ) 10 MG tablet TAKE 1 TABLET(10 MG) BY MOUTH TWICE DAILY BEFORE A MEAL 11/13/18  Yes Newlin, Enobong, MD  Insulin  Glargine (LANTUS  SOLOSTAR) 100 UNIT/ML Solostar Pen Inject 35 Units into the skin at bedtime. 03/29/19  Yes Newlin, Lavelle Posey, MD  JARDIANCE 25 MG TABS tablet Take 25 mg by mouth daily. 07/06/23  Yes [provider]  Olopatadine-Mometasone (RYALTRIS ) 665-25 MCG/ACT SUSP Place 2 puffs into the nose in the morning and at bedtime. 07/27/23  Yes Mecum, Erin E, PA-C  albuterol  (VENTOLIN  HFA) 108 (90 Base) MCG/ACT inhaler Inhale 1-2 puffs into the lungs every 6 (six)  hours as needed for wheezing or shortness of breath. 07/25/22   Starlene Eaton, FNP  Alcohol  Swabs  PADS 1 each by Does not apply route 3 (three) times daily before meals. 11/13/18   Newlin, Enobong, MD  azithromycin  (ZITHROMAX  Z-PAK) 250 MG tablet Take 2 pills (500mg ) first day and one pill (250mg ) the remaining 4 days. Patient not taking: Reported on 07/18/2023 06/12/23   Levora Reas A, NP  B-D UF III MINI PEN NEEDLES 31G X 5 MM MISC USE TO INJECT AT BEDTIME  06/21/18   Newlin, Enobong, MD  BD PEN NEEDLE MICRO U/F 32G X 6 MM MISC SMARTSIG:Injection Every Morning 05/15/22   [provider]  Blood Glucose Monitoring Suppl (ONETOUCH VERIO) w/Device KIT USE AS DIRECTED 03/14/18   Joaquin Mulberry, MD  cetirizine  (ZYRTEC  ALLERGY) 10 MG tablet Take 1 tablet (10 mg total) by mouth daily. 08/19/20   Adolph Hoop, PA-C  diclofenac  sodium (VOLTAREN ) 1 % GEL Apply 2 g topically 4 (four) times daily. 09/08/18   Dain Drown, MD  glucose blood (ONETOUCH VERIO) test strip Use as instructed 03/14/18   Newlin, Enobong, MD  Guaifenesin  1200 MG TB12 Take 1 tablet (1,200 mg total) by mouth in the morning and at bedtime. 10/23/21   Starlene Eaton, FNP  hydrocortisone  (ANUSOL -HC) 2.5 % rectal cream Apply to rectal hemorrhoid BID PRN 08/20/18   Lawrance Presume, MD  hydrocortisone  cream 1 % APP AA QID FOR 7 DAYS 05/21/19   [provider]  hydrocortisone -pramoxine (ANALPRAM  HC) 2.5-1 % rectal cream Place 1 application rectally 3 (three) times daily. 08/14/18   Newlin, Enobong, MD  ipratropium-albuterol  (DUONEB) 0.5-2.5 (3) MG/3ML SOLN Take 3 mLs by nebulization every 6 (six) hours as needed for up to 10 days. 07/29/22 08/30/22  Harlow Lighter, Lamone Ferrelli  N, FNP  ipratropium-albuterol  (DUONEB) 0.5-2.5 (3) MG/3ML SOLN Take 3 mLs by nebulization every 6 (six) hours as needed for up to 10 days. Patient taking differently: Take 3 mLs by nebulization every 6 (six) hours as needed. Last used: 08-05-2022 07/29/22 08/08/22  Harlow Lighter, Jalena Vanderlinden  N, FNP  Lancet Devices [facility]) lancets Use as instructed for 3 times daily testing of blood sugar. 12/07/15   Newlin, Enobong, MD  loperamide  (IMODIUM ) 2 MG capsule Take 1 capsule (2 mg total) by mouth 2 (two) times daily as needed for diarrhea or loose stools. Patient not taking: Reported on 07/18/2023 11/16/20   Adolph Hoop, PA-C  methylPREDNISolone  (MEDROL  DOSEPAK) 4 MG TBPK tablet Take as directed 06/08/23   Lesle Ras, MD   [facility] DELICA LANCETS 33G MISC USE AS DIRECTED 03/14/18   Newlin, Enobong, MD  sodium chloride  (OCEAN) 0.65 % SOLN nasal spray Place 1 spray into both nostrils as needed for congestion. 02/07/20   Darr, Jacob, PA-C  triamcinolone  cream (KENALOG ) 0.1 % Apply 1 application topically 2 (two) times daily. Patient not taking: Reported on 08/30/2022 09/20/20   Corbin Dess, PA-C  valACYclovir  (VALTREX ) 1000 MG tablet TAKE 1 TABLET(1000 MG) BY MOUTH TWICE DAILY 10/01/18   Newlin, Enobong, MD    Family History Family History  Problem Relation Age of Onset   Hypertension Mother    Heart failure Father     Social History Social History   Tobacco Use   Smoking status: Never   Smokeless tobacco: Never  Vaping Use   Vaping status: Never Used  Substance Use Topics   Alcohol  use: Yes    Alcohol /week: 2.0 standard drinks of alcohol     Types: 1 Cans of  beer, 1 Shots of liquor per week    Comment: Last night. with 1 shot of liquor   Drug use: No     Allergies   Shrimp [shellfish allergy] and Lisinopril    Review of Systems Review of Systems  Per HPI  Physical Exam Triage Vital Signs ED Triage Vitals  Encounter Vitals Group     BP 10/20/23 0848 128/70     Systolic BP Percentile --      Diastolic BP Percentile --      Pulse Rate 10/20/23 0848 67     Resp 10/20/23 0848 18     Temp 10/20/23 0848 98.3 F (36.8 C)     Temp Source 10/20/23 0848 Oral     SpO2 10/20/23 0848 96 %     Weight --      Height --      Head Circumference --      Peak Flow --      Pain Score 10/20/23 0846 8     Pain Loc --      Pain Education --      Exclude from Growth Chart --    No data found.  Updated Vital Signs BP 128/70 (BP Location: Right Arm)   Pulse 67   Temp 98.3 F (36.8 C) (Oral)   Resp 18   SpO2 96%   Visual Acuity Right Eye Distance:   Left Eye Distance:   Bilateral Distance:    Right Eye Near:   Left Eye Near:    Bilateral Near:     Physical Exam Vitals and  nursing note reviewed.  Constitutional:      Appearance: Normal appearance. He is well-developed.  HENT:     Head: Normocephalic and atraumatic.     Right Ear: Tympanic membrane, ear canal and external ear normal.     Left Ear: Tympanic membrane, ear canal and external ear normal.     Nose: Nose normal.     Mouth/Throat:     Mouth: Mucous membranes are moist.  Eyes:     General: No scleral icterus.    Extraocular Movements: Extraocular movements intact.     Conjunctiva/sclera: Conjunctivae normal.     Pupils: Pupils are equal, round, and reactive to light.  Cardiovascular:     Rate and Rhythm: Normal rate.  Pulmonary:     Effort: Pulmonary effort is normal. No respiratory distress.  Neurological:     General: No focal deficit present.     Mental Status: He is alert and oriented to person, place, and time.     GCS: GCS eye subscore is 4. GCS verbal subscore is 5. GCS motor subscore is 6.     Cranial Nerves: No cranial nerve deficit, dysarthria or facial asymmetry.  Psychiatric:        Mood and Affect: Mood normal.        Speech: Speech normal.      UC Treatments / Results  Labs (all labs ordered are listed, but only abnormal results are displayed) Labs Reviewed - No data to display  EKG   Radiology No results found.  Procedures Procedures (including critical care time)  Medications Ordered in UC Medications  dexamethasone  (DECADRON ) injection 10 mg (has no administration in time range)  acetaminophen  (TYLENOL ) tablet 650 mg (has no administration in time range)    Initial Impression / Assessment and Plan / UC Course  I have reviewed the triage vital signs and the nursing notes.  Pertinent labs & imaging results that  were available during my care of the patient were reviewed by me and considered in my medical decision making (see chart for details).  Vitals and triage reviewed, patient is hemodynamically stable.  Tympanic membrane's are pearly gray with good cone  of light.  External auditory canal is not obstructed with any cerumen.  PERRLA.  Extraocular movements intact.  History of HIV, well-controlled.  No red flag symptoms on physical exam or with HPI.  Headache is responsive to Tylenol .  Will add intramuscular steroid to see if this helps as well.  Withhold NSAIDs due to renal function.  Encouraged PCP follow-up.  Strict emergency precautions given if symptoms evolve.  Plan of care, follow-up care return precautions given, no questions at this time.     Final Clinical Impressions(s) / UC Diagnoses   Final diagnoses:  Acute nonintractable headache, unspecified headache type     Discharge Instructions      We have given you a one-time dose of steroids today in clinic to see if this will help assist with managing your headache.  You can continue to take the Tylenol  500 mg every 6-8 hours to help manage your headache.  Ensure you are staying well-hydrated and getting plenty of rest.  Please contact your primary care provider today to see if they are able to get you in sooner for reevaluation.  If you have any changes such as sudden worsening of your headache, vision loss, nausea, vomiting, or new concerning symptoms please seek immediate care at the nearest emergency department.    ED Prescriptions   None    PDMP not reviewed this encounter.   Harlow Lighter, Annalynne Ibanez  N, FNP 10/20/23 [phone]

## 2023-10-20 NOTE — Discharge Instructions (Addendum)
 We have given you a one-time dose of steroids today in clinic to see if this will help assist with managing your headache.  You can continue to take the Tylenol  500 mg every 6-8 hours to help manage your headache.  Ensure you are staying well-hydrated and getting plenty of rest.  Please contact your primary care provider today to see if they are able to get you in sooner for reevaluation.  If you have any changes such as sudden worsening of your headache, vision loss, nausea, vomiting, or new concerning symptoms please seek immediate care at the nearest emergency department.

## 2023-11-02 ENCOUNTER — Other Ambulatory Visit: Payer: Self-pay

## 2023-11-02 ENCOUNTER — Ambulatory Visit

## 2023-11-02 ENCOUNTER — Ambulatory Visit: Payer: Medicare HMO | Admitting: Internal Medicine

## 2023-11-15 ENCOUNTER — Encounter: Payer: Self-pay | Admitting: Internal Medicine

## 2023-11-15 ENCOUNTER — Other Ambulatory Visit: Payer: Self-pay

## 2023-11-15 ENCOUNTER — Ambulatory Visit: Admitting: Internal Medicine

## 2023-11-15 VITALS — BP 124/73 | HR 68 | Resp 16 | Ht 71.0 in | Wt 196.2 lb

## 2023-11-15 DIAGNOSIS — B20 Human immunodeficiency virus [HIV] disease: Secondary | ICD-10-CM | POA: Diagnosis not present

## 2023-11-15 DIAGNOSIS — N184 Chronic kidney disease, stage 4 (severe): Secondary | ICD-10-CM | POA: Diagnosis not present

## 2023-11-15 DIAGNOSIS — N1832 Chronic kidney disease, stage 3b: Secondary | ICD-10-CM

## 2023-11-15 LAB — CBC WITH DIFFERENTIAL/PLATELET
Absolute Monocytes: 455 {cells}/uL (ref 200–950)
Basophils Absolute: 18 {cells}/uL (ref 0–200)
Eosinophils Absolute: 99 {cells}/uL (ref 15–500)
HCT: 41 % (ref 38.5–50.0)
Hemoglobin: 13.1 g/dL — ABNORMAL LOW (ref 13.2–17.1)
MCH: 29.2 pg (ref 27.0–33.0)
MPV: 11.9 fL (ref 7.5–12.5)
Monocytes Relative: 10.1 %
RBC: 4.48 10*6/uL (ref 4.20–5.80)
Total Lymphocyte: 28.9 %

## 2023-11-15 LAB — COMPLETE METABOLIC PANEL WITHOUT GFR
AG Ratio: 1.4 (calc) (ref 1.0–2.5)
AST: 23 U/L (ref 10–35)
Albumin: 4.7 g/dL (ref 3.6–5.1)
BUN: 23 mg/dL (ref 7–25)
CO2: 25 mmol/L (ref 20–32)
Globulin: 3.4 g/dL (ref 1.9–3.7)
Total Protein: 8.1 g/dL (ref 6.1–8.1)

## 2023-11-15 NOTE — Progress Notes (Signed)
 RFV: follow up for hiv disease  Patient ID: Benjamin Hunter, male   DOB: [birth year], 63 y.o.   MRN: [id number]  HPI  Benjamin Hunter is a 63 y.o.old male with a history of HIV( previously on Truvada and Isentress > stribild since 08/21/2013>Genvoya (since 08/11/2014)> biktarvy  ( since prior to 11/2017), HTN, HLD, DM, Depression, --previously followed by Dr. Elaine  On biktarvy  --> no missing doses  Currently on jardiance for diabetes Hemoglobin A1c 8.7  Moving to a new place.  Outpatient Encounter Medications as of 11/15/2023  Medication Sig   albuterol  (VENTOLIN  HFA) 108 (90 Base) MCG/ACT inhaler Inhale 1-2 puffs into the lungs every 6 (six) hours as needed for wheezing or shortness of breath.   Alcohol  Swabs  PADS 1 each by Does not apply route 3 (three) times daily before meals.   amLODipine  (NORVASC ) 10 MG tablet Take 10 mg by mouth daily.   atorvastatin  (LIPITOR) 80 MG tablet Take 80 mg by mouth daily.   B-D UF III MINI PEN NEEDLES 31G X 5 MM MISC USE TO INJECT AT BEDTIME   BD PEN NEEDLE MICRO U/F 32G X 6 MM MISC SMARTSIG:Injection Every Morning   bictegravir-emtricitabine -tenofovir  AF (BIKTARVY ) 50-200-25 MG TABS tablet Take 1 tablet by mouth daily.   Blood Glucose Monitoring Suppl (ONETOUCH VERIO) w/Device KIT USE AS DIRECTED   cetirizine  (ZYRTEC  ALLERGY) 10 MG tablet Take 1 tablet (10 mg total) by mouth daily.   diclofenac  sodium (VOLTAREN ) 1 % GEL Apply 2 g topically 4 (four) times daily.   ezetimibe  (ZETIA ) 10 MG tablet Take 10 mg by mouth daily.   fenofibrate  160 MG tablet Take 160 mg by mouth daily.   glipiZIDE  (GLUCOTROL ) 10 MG tablet TAKE 1 TABLET(10 MG) BY MOUTH TWICE DAILY BEFORE A MEAL   glucose blood (ONETOUCH VERIO) test strip Use as instructed   Guaifenesin  1200 MG TB12 Take 1 tablet (1,200 mg total) by mouth in the morning and at bedtime.   hydrocortisone  (ANUSOL -HC) 2.5 % rectal cream Apply to rectal hemorrhoid BID PRN   hydrocortisone  cream 1 % APP AA QID  FOR 7 DAYS   hydrocortisone -pramoxine (ANALPRAM  HC) 2.5-1 % rectal cream Place 1 application rectally 3 (three) times daily.   Insulin  Glargine (LANTUS  SOLOSTAR) 100 UNIT/ML Solostar Pen Inject 35 Units into the skin at bedtime.   ipratropium-albuterol  (DUONEB) 0.5-2.5 (3) MG/3ML SOLN Take 3 mLs by nebulization every 6 (six) hours as needed for up to 10 days.   ipratropium-albuterol  (DUONEB) 0.5-2.5 (3) MG/3ML SOLN Take 3 mLs by nebulization every 6 (six) hours as needed for up to 10 days.   JARDIANCE 25 MG TABS tablet Take 25 mg by mouth daily.   Lancet Devices (ACCU-CHEK SOFTCLIX) lancets Use as instructed for 3 times daily testing of blood sugar.   methylPREDNISolone  (MEDROL  DOSEPAK) 4 MG TBPK tablet Take as directed   Olopatadine-Mometasone (RYALTRIS ) 665-25 MCG/ACT SUSP Place 2 puffs into the nose in the morning and at bedtime.   ONETOUCH DELICA LANCETS 33G MISC USE AS DIRECTED   azithromycin  (ZITHROMAX  Z-PAK) 250 MG tablet Take 2 pills (500mg ) first day and one pill (250mg ) the remaining 4 days. (Patient not taking: Reported on 11/15/2023)   loperamide  (IMODIUM ) 2 MG capsule Take 1 capsule (2 mg total) by mouth 2 (two) times daily as needed for diarrhea or loose stools. (Patient not taking: Reported on 11/15/2023)   sodium chloride  (OCEAN) 0.65 % SOLN nasal spray Place 1 spray into both nostrils as needed for congestion.   triamcinolone   cream (KENALOG ) 0.1 % Apply 1 application topically 2 (two) times daily. (Patient not taking: Reported on 11/15/2023)   valACYclovir  (VALTREX ) 1000 MG tablet TAKE 1 TABLET(1000 MG) BY MOUTH TWICE DAILY   No facility-administered encounter medications on file as of 11/15/2023.     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   Depression 01/14/2013   Rash and nonspecific skin eruption 10/11/2012   Recurrent low back pain 10/11/2012   Left facial numbness 08/20/2012   Seasonal allergies 09/20/2011   Polyarthritis 09/06/2011   Type 2 diabetes mellitus with hyperglycemia, with long-term current use of insulin  (HCC) 03/16/2009   Human immunodeficiency virus (HIV) disease (HCC) 07/17/2008   Hyperlipidemia 07/17/2008   Hypertension 07/17/2008     Health Maintenance Due  Topic Date Due   Medicare Annual Wellness (AWV)  Never done   Zoster Vaccines- Shingrix (1 of 2) Never done   Colonoscopy  Never done   OPHTHALMOLOGY EXAM  04/13/2016   Pneumococcal Vaccine 64-65 Years old (3 of 3 - PCV) 07/04/2018   FOOT EXAM  12/20/2018   Diabetic kidney evaluation - Urine ACR  04/19/2019   HEMOGLOBIN A1C  05/15/2019   COVID-19 Vaccine (3 - Moderna risk series) 09/30/2019     Review of Systems 12 point ros is otherwise negative Physical Exam   BP 124/73   Pulse 68   Resp 16   Ht 5' 11 (1.803 m)   Wt 196 lb 3.2 oz (89 kg)   SpO2 98%   BMI 27.36 kg/m   Physical Exam  Constitutional: He is oriented to person, place, and time. He appears well-developed and well-nourished. No distress.  HENT:  Mouth/Throat: Oropharynx is clear and moist. No oropharyngeal exudate.  Cardiovascular: Normal rate, regular rhythm and normal heart sounds. Exam reveals no gallop and no friction rub.  No murmur heard.  Pulmonary/Chest: Effort normal and breath sounds normal. No respiratory distress. He has no wheezes.  Abdominal: Soft. Bowel sounds are normal. He exhibits no distension. There is no tenderness.  Lymphadenopathy:  He has no cervical adenopathy.  Neurological: He is alert and oriented to person, place, and time.  Skin: Skin is warm and dry. No  rash noted. No erythema.  Psychiatric: He has a normal mood and affect. His behavior is normal.   Lab Results  Component Value Date   CD4TCELL 43 07/17/2023   Lab Results  Component Value Date   CD4TABS 478 07/17/2023   CD4TABS 634 08/09/2022   CD4TABS 482 09/28/2020   Lab Results  Component Value Date   HIV1RNAQUANT Not Detected 07/17/2023   Lab Results  Component Value Date   HEPBSAB NEG 11/27/2007   Lab Results  Component Value Date   LABRPR NON-REACTIVE 07/17/2023    CBC Lab Results  Component Value Date   WBC 4.5 07/17/2023   RBC 4.06 (L) 07/17/2023   HGB 11.8 (L) 07/17/2023   HCT 36.6 (L) 07/17/2023   PLT 220 07/17/2023   MCV 90.1 07/17/2023   MCH 29.1 07/17/2023   MCHC 32.2 07/17/2023   RDW 12.1 07/17/2023   LYMPHSABS 1.6 09/15/2021  MONOABS 0.2 08/10/2012   EOSABS 81 07/17/2023    BMET Lab Results  Component Value Date   NA 138 07/17/2023   K 3.8 07/17/2023   CL 104 07/17/2023   CO2 25 07/17/2023   GLUCOSE 114 (H) 07/17/2023   BUN 29 (H) 07/17/2023   CREATININE 1.86 (H) 07/17/2023   CALCIUM  9.6 07/17/2023   GFRNONAA 56 (L) 04/10/2020   GFRAA 78 11/13/2018      Assessment and Plan HIV disease= plan to check cd 4 count and VL today and provide refills;also check [id number] in case need to change to triumeq  Long ter medication management/ CKD 4 = will check kidney function ; check for proteinuria  Health maintenance = will give Pneumonia vaccine

## 2023-11-16 LAB — URINALYSIS, ROUTINE W REFLEX MICROSCOPIC
Bacteria, UA: NONE SEEN /HPF
Bilirubin Urine: NEGATIVE
Hgb urine dipstick: NEGATIVE
Hyaline Cast: NONE SEEN /LPF
Ketones, ur: NEGATIVE
Leukocytes,Ua: NEGATIVE
Nitrite: NEGATIVE
RBC / HPF: NONE SEEN /HPF (ref 0–2)
Specific Gravity, Urine: 1.023 (ref 1.001–1.035)
Squamous Epithelial / HPF: NONE SEEN /HPF (ref <=5)
WBC, UA: NONE SEEN /HPF (ref 0–5)
pH: 5.5 (ref 5.0–8.0)

## 2023-11-16 LAB — T-HELPER CELLS (CD4) COUNT (NOT AT ARMC): Absolute CD4: 532 {cells}/uL (ref 490–1740)

## 2023-11-17 ENCOUNTER — Other Ambulatory Visit: Payer: Self-pay

## 2023-11-17 DIAGNOSIS — N189 Chronic kidney disease, unspecified: Secondary | ICD-10-CM

## 2023-11-17 DIAGNOSIS — N1832 Chronic kidney disease, stage 3b: Secondary | ICD-10-CM

## 2023-11-20 LAB — COMPLETE METABOLIC PANEL WITHOUT GFR
ALT: 21 U/L (ref 9–46)
Alkaline phosphatase (APISO): 48 U/L (ref 35–144)
BUN/Creatinine Ratio: 12 (calc) (ref 6–22)
Calcium: 10 mg/dL (ref 8.6–10.3)
Chloride: 106 mmol/L (ref 98–110)
Creat: 1.91 mg/dL — ABNORMAL HIGH (ref 0.70–1.35)
Glucose, Bld: 73 mg/dL (ref 65–99)
Potassium: 4.1 mmol/L (ref 3.5–5.3)
Sodium: 139 mmol/L (ref 135–146)
Total Bilirubin: 0.6 mg/dL (ref 0.2–1.2)

## 2023-11-20 LAB — CBC WITH DIFFERENTIAL/PLATELET
Absolute Lymphocytes: 1301 {cells}/uL (ref 850–3900)
Basophils Relative: 0.4 %
Eosinophils Relative: 2.2 %
MCHC: 32 g/dL (ref 32.0–36.0)
MCV: 91.5 fL (ref 80.0–100.0)
Neutro Abs: 2628 {cells}/uL (ref 1500–7800)
Neutrophils Relative %: 58.4 %
Platelets: 192 10*3/uL (ref 140–400)
RDW: 12.7 % (ref 11.0–15.0)
WBC: 4.5 10*3/uL (ref 3.8–10.8)

## 2023-11-20 LAB — HIV-1 RNA QUANT-NO REFLEX-BLD
HIV 1 RNA Quant: NOT DETECTED {copies}/mL
HIV-1 RNA Quant, Log: NOT DETECTED {Log_copies}/mL

## 2023-11-20 LAB — HLA B*5701: HLA-B*5701 w/rflx HLA-B High: NEGATIVE

## 2023-11-20 LAB — T-HELPER CELLS (CD4) COUNT (NOT AT ARMC)
CD4 T Helper %: 40 % (ref 30–61)
Total lymphocyte count: 1318 {cells}/uL (ref 850–3900)

## 2023-11-20 LAB — RPR: RPR Ser Ql: NONREACTIVE

## 2024-02-21 ENCOUNTER — Ambulatory Visit

## 2024-02-21 ENCOUNTER — Other Ambulatory Visit: Payer: Self-pay

## 2024-02-21 ENCOUNTER — Other Ambulatory Visit

## 2024-02-21 ENCOUNTER — Other Ambulatory Visit (HOSPITAL_COMMUNITY)
Admission: RE | Admit: 2024-02-21 | Discharge: 2024-02-21 | Disposition: A | Discharge status: Home / Self Care | Source: Ambulatory Visit | Attending: Internal Medicine | Admitting: Internal Medicine

## 2024-02-21 DIAGNOSIS — B20 Human immunodeficiency virus [HIV] disease: Secondary | ICD-10-CM | POA: Diagnosis present

## 2024-02-21 DIAGNOSIS — Z113 Encounter for screening for infections with a predominantly sexual mode of transmission: Secondary | ICD-10-CM

## 2024-02-22 LAB — URINE CYTOLOGY ANCILLARY ONLY
Chlamydia: NEGATIVE
Comment: NEGATIVE
Comment: NORMAL
Neisseria Gonorrhea: NEGATIVE

## 2024-02-22 LAB — T-HELPER CELL (CD4) - (RCID CLINIC ONLY)
CD4 % Helper T Cell: 38 % (ref 33–65)
CD4 T Cell Abs: 559 /uL (ref 400–1790)

## 2024-02-24 LAB — CBC WITH DIFFERENTIAL/PLATELET
Absolute Lymphocytes: 1544 {cells}/uL (ref 850–3900)
Absolute Monocytes: 348 {cells}/uL (ref 200–950)
Basophils Absolute: 22 {cells}/uL (ref 0–200)
Basophils Relative: 0.5 %
Eosinophils Absolute: 158 {cells}/uL (ref 15–500)
Eosinophils Relative: 3.6 %
HCT: 39.5 % (ref 38.5–50.0)
Hemoglobin: 12.4 g/dL — ABNORMAL LOW (ref 13.2–17.1)
MCH: 29.1 pg (ref 27.0–33.0)
MCHC: 31.4 g/dL — ABNORMAL LOW (ref 32.0–36.0)
MCV: 92.7 fL (ref 80.0–100.0)
MPV: 12.1 fL (ref 7.5–12.5)
Monocytes Relative: 7.9 %
Neutro Abs: 2328 {cells}/uL (ref 1500–7800)
Neutrophils Relative %: 52.9 %
Platelets: 186 Thousand/uL (ref 140–400)
RBC: 4.26 Million/uL (ref 4.20–5.80)
RDW: 12 % (ref 11.0–15.0)
Total Lymphocyte: 35.1 %
WBC: 4.4 Thousand/uL (ref 3.8–10.8)

## 2024-02-24 LAB — HIV-1 RNA QUANT-NO REFLEX-BLD
HIV 1 RNA Quant: NOT DETECTED {copies}/mL
HIV-1 RNA Quant, Log: NOT DETECTED {Log_copies}/mL

## 2024-02-24 LAB — COMPLETE METABOLIC PANEL WITHOUT GFR
AG Ratio: 1.2 (calc) (ref 1.0–2.5)
ALT: 19 U/L (ref 9–46)
AST: 21 U/L (ref 10–35)
Albumin: 4.3 g/dL (ref 3.6–5.1)
Alkaline phosphatase (APISO): 49 U/L (ref 35–144)
BUN/Creatinine Ratio: 17 (calc) (ref 6–22)
BUN: 33 mg/dL — ABNORMAL HIGH (ref 7–25)
CO2: 21 mmol/L (ref 20–32)
Calcium: 9.7 mg/dL (ref 8.6–10.3)
Chloride: 106 mmol/L (ref 98–110)
Creat: 1.99 mg/dL — ABNORMAL HIGH (ref 0.70–1.35)
Globulin: 3.6 g/dL (ref 1.9–3.7)
Glucose, Bld: 211 mg/dL — ABNORMAL HIGH (ref 65–99)
Potassium: 4.1 mmol/L (ref 3.5–5.3)
Sodium: 136 mmol/L (ref 135–146)
Total Bilirubin: 0.5 mg/dL (ref 0.2–1.2)
Total Protein: 7.9 g/dL (ref 6.1–8.1)

## 2024-02-28 ENCOUNTER — Other Ambulatory Visit

## 2024-03-06 ENCOUNTER — Ambulatory Visit: Payer: Self-pay | Admitting: Internal Medicine

## 2024-03-08 ENCOUNTER — Other Ambulatory Visit: Payer: Self-pay

## 2024-03-08 ENCOUNTER — Ambulatory Visit: Admitting: Internal Medicine

## 2024-03-08 ENCOUNTER — Encounter: Payer: Self-pay | Admitting: Internal Medicine

## 2024-03-08 VITALS — BP 122/75 | HR 73 | Temp 98.2°F | Ht 71.0 in | Wt 198.0 lb

## 2024-03-08 DIAGNOSIS — E1165 Type 2 diabetes mellitus with hyperglycemia: Secondary | ICD-10-CM

## 2024-03-08 DIAGNOSIS — Z23 Encounter for immunization: Secondary | ICD-10-CM

## 2024-03-08 DIAGNOSIS — B20 Human immunodeficiency virus [HIV] disease: Secondary | ICD-10-CM | POA: Diagnosis not present

## 2024-03-08 DIAGNOSIS — Z794 Long term (current) use of insulin: Secondary | ICD-10-CM

## 2024-03-08 DIAGNOSIS — N1832 Chronic kidney disease, stage 3b: Secondary | ICD-10-CM | POA: Diagnosis not present

## 2024-03-08 MED ORDER — BIKTARVY 50-200-25 MG PO TABS: 1.0000 | ORAL_TABLET | Freq: Every day | ORAL | 11 refills | Status: AC

## 2024-03-08 NOTE — Progress Notes (Signed)
 RFV: follow up for hiv disease  Patient ID: Benjamin Hunter, male   DOB: 1960-11-29, 63 y.o.   MRN: [id number]  HPI Benjamin Hunter is a 63yo M with hiv disease and ckd3b,, CD 4 count of 559/VL<20 on biktarvy . He continues to take medications daily without missing a dose. He remains undetectable. Overall health is going well.  Outpatient Encounter Medications as of 03/08/2024  Medication Sig   albuterol  (VENTOLIN  HFA) 108 (90 Base) MCG/ACT inhaler Inhale 1-2 puffs into the lungs every 6 (six) hours as needed for wheezing or shortness of breath.   Alcohol  Swabs  PADS 1 each by Does not apply route 3 (three) times daily before meals.   amLODipine  (NORVASC ) 10 MG tablet Take 10 mg by mouth daily.   atorvastatin  (LIPITOR) 80 MG tablet Take 80 mg by mouth daily.   B-D UF III MINI PEN NEEDLES 31G X 5 MM MISC USE TO INJECT AT BEDTIME   BD PEN NEEDLE MICRO U/F 32G X 6 MM MISC SMARTSIG:Injection Every Morning   bictegravir-emtricitabine -tenofovir  AF (BIKTARVY ) 50-200-25 MG TABS tablet Take 1 tablet by mouth daily.   Blood Glucose Monitoring Suppl (ONETOUCH VERIO) w/Device KIT USE AS DIRECTED   cetirizine  (ZYRTEC  ALLERGY) 10 MG tablet Take 1 tablet (10 mg total) by mouth daily.   diclofenac  sodium (VOLTAREN ) 1 % GEL Apply 2 g topically 4 (four) times daily.   ezetimibe  (ZETIA ) 10 MG tablet Take 10 mg by mouth daily.   fenofibrate  160 MG tablet Take 160 mg by mouth daily.   glipiZIDE  (GLUCOTROL ) 10 MG tablet TAKE 1 TABLET(10 MG) BY MOUTH TWICE DAILY BEFORE A MEAL   glucose blood (ONETOUCH VERIO) test strip Use as instructed   hydrocortisone  (ANUSOL -HC) 2.5 % rectal cream Apply to rectal hemorrhoid BID PRN   hydrocortisone  cream 1 % APP AA QID FOR 7 DAYS   hydrocortisone -pramoxine (ANALPRAM  HC) 2.5-1 % rectal cream Place 1 application rectally 3 (three) times daily.   Insulin  Glargine (LANTUS  SOLOSTAR) 100 UNIT/ML Solostar Pen Inject 35 Units into the skin at bedtime.   ipratropium-albuterol  (DUONEB)  0.5-2.5 (3) MG/3ML SOLN Take 3 mLs by nebulization every 6 (six) hours as needed for up to 10 days.   JARDIANCE 25 MG TABS tablet Take 25 mg by mouth daily.   Lancet Devices (ACCU-CHEK SOFTCLIX) lancets Use as instructed for 3 times daily testing of blood sugar.   loperamide  (IMODIUM ) 2 MG capsule Take 1 capsule (2 mg total) by mouth 2 (two) times daily as needed for diarrhea or loose stools.   Olopatadine-Mometasone (RYALTRIS ) 665-25 MCG/ACT SUSP Place 2 puffs into the nose in the morning and at bedtime.   ONETOUCH DELICA LANCETS 33G MISC USE AS DIRECTED   sodium chloride  (OCEAN) 0.65 % SOLN nasal spray Place 1 spray into both nostrils as needed for congestion.   valACYclovir  (VALTREX ) 1000 MG tablet TAKE 1 TABLET(1000 MG) BY MOUTH TWICE DAILY   [DISCONTINUED] azithromycin  (ZITHROMAX  Z-PAK) 250 MG tablet Take 2 pills (500mg ) first day and one pill (250mg ) the remaining 4 days. (Patient not taking: Reported on 03/08/2024)   [DISCONTINUED] Guaifenesin  1200 MG TB12 Take 1 tablet (1,200 mg total) by mouth in the morning and at bedtime. (Patient not taking: Reported on 03/08/2024)   [DISCONTINUED] ipratropium-albuterol  (DUONEB) 0.5-2.5 (3) MG/3ML SOLN Take 3 mLs by nebulization every 6 (six) hours as needed for up to 10 days. (Patient not taking: Reported on 03/08/2024)   [DISCONTINUED] methylPREDNISolone  (MEDROL  DOSEPAK) 4 MG TBPK tablet Take as directed (Patient not taking:  Reported on 03/08/2024)   [DISCONTINUED] triamcinolone  cream (KENALOG ) 0.1 % Apply 1 application topically 2 (two) times daily. (Patient not taking: Reported on 03/08/2024)   No facility-administered encounter medications on file as of 03/08/2024.     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   Depression 01/14/2013   Rash and nonspecific skin eruption 10/11/2012   Recurrent low back pain 10/11/2012   Left facial numbness 08/20/2012   Seasonal allergies 09/20/2011   Polyarthritis 09/06/2011   Type 2 diabetes mellitus with hyperglycemia, with long-term current use of insulin  (HCC) 03/16/2009   Human immunodeficiency virus (HIV) disease (HCC) 07/17/2008   Hyperlipidemia 07/17/2008   Hypertension 07/17/2008     Health Maintenance Due  Topic Date Due   Medicare Annual Wellness (AWV)  Never done   Zoster Vaccines- Shingrix (1 of 2) Never done   Colonoscopy  Never done   OPHTHALMOLOGY EXAM  04/13/2016   Pneumococcal Vaccine: 50+ Years (3 of 3 - PCV) 07/04/2018   FOOT EXAM  12/20/2018   Diabetic kidney evaluation - Urine ACR  04/19/2019   HEMOGLOBIN A1C  05/15/2019   COVID-19 Vaccine (3 - Moderna risk series) 09/30/2019   Influenza Vaccine  12/29/2023     Review of Systems Occ constipation. 12 point ros is otherwise negative Physical Exam   Ht 5' 11 (1.803 m)   Wt 198 lb (89.8 kg)   BMI 27.62 kg/m   Physical Exam  Constitutional: He is oriented to person, place, and time. He appears well-developed and well-nourished. No distress.  HENT:  Mouth/Throat: Oropharynx is clear and moist. No oropharyngeal exudate.  Cardiovascular: Normal rate, regular rhythm and normal heart sounds. Exam reveals no gallop and no friction rub.  No murmur heard.  Pulmonary/Chest: Effort normal and breath sounds normal. No respiratory distress. He has no wheezes.  Abdominal: Soft. Bowel sounds are normal. He exhibits no distension. There is no tenderness.  Lymphadenopathy:  He has no cervical adenopathy.  Neurological: He is alert and oriented to person, place, and time.  Skin: Skin is warm and dry. No  rash noted. No erythema.  Psychiatric: He has a normal mood and affect. His behavior is normal.   Lab Results  Component Value Date   CD4TCELL 38 02/21/2024   Lab Results  Component Value Date   CD4TABS 559 02/21/2024   CD4TABS 478 07/17/2023   CD4TABS 634 08/09/2022   Lab Results  Component Value Date   HIV1RNAQUANT NOT DETECTED 02/21/2024   Lab Results  Component Value Date   HEPBSAB NEG 11/27/2007   Lab Results  Component Value Date   LABRPR NON-REACTIVE 11/15/2023    CBC Lab Results  Component Value Date   WBC 4.4 02/21/2024   RBC 4.26 02/21/2024   HGB 12.4 (L) 02/21/2024   HCT 39.5 02/21/2024   PLT 186 02/21/2024   MCV 92.7 02/21/2024   MCH 29.1 02/21/2024   MCHC 31.4 (L) 02/21/2024   RDW 12.0 02/21/2024   LYMPHSABS 1.6 09/15/2021   MONOABS 0.2 08/10/2012   EOSABS 158 02/21/2024    BMET Lab Results  Component Value Date  NA 136 02/21/2024   K 4.1 02/21/2024   CL 106 02/21/2024   CO2 21 02/21/2024   GLUCOSE 211 (H) 02/21/2024   BUN 33 (H) 02/21/2024   CREATININE 1.99 (H) 02/21/2024   CALCIUM  9.7 02/21/2024   GFRNONAA 56 (L) 04/10/2020   GFRAA 78 11/13/2018      Assessment and Plan Hiv disease= labs shows that he is well stable. Will provide refills on biktarvy   Ckd 3b = appears stable from previous blood work. Continue with bitkarvy  Health maintenance= Flu and pneumonia vax today;No sti testing - no sexually active for years  T2DM = recent random blood test showing BS >200. Recommend to see pcp to titrate his medications for better dM contro  Left thumb arthritis = not unusual can take

## 2024-03-11 ENCOUNTER — Other Ambulatory Visit: Payer: Self-pay

## 2024-03-11 ENCOUNTER — Encounter (HOSPITAL_COMMUNITY): Payer: Self-pay | Admitting: *Deleted

## 2024-03-11 ENCOUNTER — Ambulatory Visit (HOSPITAL_COMMUNITY)
Admission: EM | Admit: 2024-03-11 | Discharge: 2024-03-11 | Disposition: A | Discharge status: Home / Self Care | Attending: Physician Assistant | Admitting: Physician Assistant

## 2024-03-11 DIAGNOSIS — K5900 Constipation, unspecified: Secondary | ICD-10-CM | POA: Diagnosis not present

## 2024-03-11 MED ORDER — LACTULOSE 10 GM/15ML PO SOLN: 10.0000 g | Freq: Every day | ORAL | 0 refills | Status: AC | PRN

## 2024-03-11 MED ORDER — GLYCERIN (ADULT) 2 G RE SUPP: 1.0000 | RECTAL | 0 refills | Status: AC | PRN

## 2024-03-11 NOTE — ED Triage Notes (Signed)
 PT reports he has been constipated for 4 days. PT has taken a stool softer ,and Mirlax with only small results . PT has a bottle of MAg citrate in his bag .

## 2024-03-11 NOTE — Discharge Instructions (Addendum)
 Take a dose of lactulose today and make sure that you are drinking plenty of water.  Continue eating fiber rich foods.  Use glycerin suppository.  If you do not have a good bowel movement in 24 hours you can try the mag citrate.  If at any point you have a severe abdominal pain, fever, blood in your stool, difficulty passing gas or any stool you should go to the emergency room as we discussed.  Follow-up with gastroenterology; call to schedule appointment.

## 2024-03-11 NOTE — ED Provider Notes (Signed)
 [facility]    CSN: [id number] Arrival date & time: 03/11/24  0820      History   Chief Complaint Chief Complaint  Patient presents with   Constipation    HPI Mckinley Olheiser is a 63 y.o. male.   Patient presents today with a 4-day history of constipation.  He reports that he has a history of constipation with similar presentation.  He has been able to pass small hard pellets of stool with last movement yesterday evening.  He is still passing gas.  He has tried stool softener, increased fiber in his diet by eating peas and green beans, MiraLAX  but this has not allowed him to completely evacuate his bowels.  He has not had a colonoscopy in the past due to concern for insurance coverage.  He denies any associated pain, fever, nausea, vomiting, blood in his stool.  Denies any medication changes or dietary changes before symptoms began.    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 No pertinent surgical history.     Home Medications    Prior to Admission medications   Medication Sig Start Date End Date Taking? Authorizing Provider  albuterol  (VENTOLIN  HFA) 108 (90 Base) MCG/ACT inhaler Inhale 1-2 puffs into the lungs every 6 (six) hours as needed for wheezing or shortness of breath. 07/25/22  Yes Enedelia Dorna HERO, FNP  Alcohol  Swabs  PADS 1 each by Does not apply route 3 (three) times daily before meals. 11/13/18  Yes Newlin, Enobong, MD  amLODipine  (NORVASC ) 10 MG tablet Take 10 mg by mouth daily. 07/09/22  Yes [provider]  atorvastatin  (LIPITOR) 80 MG tablet Take 80 mg by mouth daily. 07/12/22  Yes [provider]  B-D UF III MINI PEN NEEDLES 31G X 5 MM MISC USE TO INJECT AT BEDTIME 06/21/18  Yes Newlin, Corrina, MD  BD PEN NEEDLE MICRO U/F 32G X 6 MM MISC SMARTSIG:Injection Every Morning 05/15/22  Yes [provider]  bictegravir-emtricitabine -tenofovir  AF (BIKTARVY ) 50-200-25 MG TABS tablet Take 1 tablet by mouth daily. 03/08/24  Yes Luiz Channel, MD  Blood Glucose Monitoring Suppl [facility] VERIO) w/Device KIT USE AS DIRECTED 03/14/18  Yes Newlin, Enobong, MD  cetirizine  (ZYRTEC  ALLERGY) 10 MG tablet Take 1 tablet (10 mg total) by mouth daily. 08/19/20  Yes Christopher Savannah, PA-C  diclofenac  sodium (VOLTAREN ) 1 % GEL Apply 2 g topically 4 (four) times daily. 09/08/18  Yes Mario Million, MD  ezetimibe  (ZETIA ) 10  MG tablet Take 10 mg by mouth daily. 09/30/23  Yes [provider]  fenofibrate  160 MG tablet Take 160 mg by mouth daily. 07/13/21  Yes [provider]  glipiZIDE  (GLUCOTROL ) 10 MG tablet TAKE 1 TABLET(10 MG) BY MOUTH TWICE DAILY BEFORE A MEAL 11/13/18  Yes Newlin, Enobong, MD  glucose blood (ONETOUCH VERIO) test strip Use as instructed 03/14/18  Yes Newlin, Enobong, MD  glycerin adult 2 g suppository Place 1 suppository rectally as needed for constipation. 03/11/24   Yes Kelty Szafran, Rocky POUR, PA-C  Insulin  Glargine (LANTUS  SOLOSTAR) 100 UNIT/ML Solostar Pen Inject 35 Units into the skin at bedtime. 03/29/19  Yes Newlin, Corrina, MD  JARDIANCE 25 MG TABS tablet Take 25 mg by mouth daily. 07/06/23  Yes [provider]  lactulose (CHRONULAC) 10 GM/15ML solution Take 15 mLs (10 g total) by mouth daily as needed for mild constipation. 03/11/24  Yes Domenick Quebedeaux, Rocky POUR, PA-C  Lancet Devices (ACCU-CHEK SOFTCLIX) lancets Use as instructed for 3 times daily testing of blood sugar. 12/07/15  Yes Newlin, Enobong, MD  Olopatadine-Mometasone (RYALTRIS ) 665-25 MCG/ACT SUSP Place 2 puffs into the nose in the morning and at bedtime. 07/27/23  Yes Mecum, Shondrika Hoque E, PA-C  ONETOUCH DELICA LANCETS 33G MISC USE AS DIRECTED 03/14/18  Yes Newlin, Enobong, MD  sodium chloride  (OCEAN) 0.65 % SOLN nasal spray Place 1 spray into both nostrils as needed for congestion. 02/07/20  Yes Darr, Jacob, PA-C  hydrocortisone  (ANUSOL -HC) 2.5 % rectal cream Apply to rectal hemorrhoid BID PRN 08/20/18   Vicci Barnie NOVAK, MD  hydrocortisone  cream 1 % APP AA QID FOR 7 DAYS 05/21/19   [provider]  hydrocortisone -pramoxine (ANALPRAM  HC) 2.5-1 % rectal cream Place 1 application rectally 3 (three) times daily. 08/14/18   Newlin, Enobong, MD  ipratropium-albuterol  (DUONEB) 0.5-2.5 (3) MG/3ML SOLN Take 3 mLs by nebulization every 6 (six) hours as needed for up to 10 days. 07/29/22 03/08/24  Dreama, [place]  N, FNP  loperamide  (IMODIUM ) 2 MG capsule Take 1 capsule (2 mg total) by mouth 2 (two) times daily as needed for diarrhea or loose stools. 11/16/20   Christopher Savannah, PA-C  valACYclovir  (VALTREX ) 1000 MG tablet TAKE 1 TABLET(1000 MG) BY MOUTH TWICE DAILY 10/01/18   Delbert Corrina, MD    Family History Family History  Problem Relation Age of Onset   Hypertension Mother    Heart failure Father     Social History Social History   Tobacco Use   Smoking status: Never   Smokeless tobacco: Never  Vaping  Use   Vaping status: Never Used  Substance Use Topics   Alcohol  use: Yes    Alcohol /week: 2.0 standard drinks of alcohol     Types: 1 Cans of beer, 1 Shots of liquor per week    Comment: Last night. with 1 shot of liquor   Drug use: No     Allergies   Shrimp [shellfish allergy] and Lisinopril    Review of Systems Review of Systems  Constitutional:  Positive for activity change. Negative for appetite change, fatigue and fever.  Respiratory:  Negative for shortness of breath.   Cardiovascular:  Negative for chest pain.  Gastrointestinal:  Positive for constipation. Negative for abdominal pain, diarrhea, nausea and vomiting.     Physical Exam Triage Vital Signs ED Triage Vitals  Encounter Vitals Group     BP 03/11/24 0924 119/72     Girls Systolic BP Percentile --      Girls Diastolic BP Percentile --  Boys Systolic BP Percentile --      Boys Diastolic BP Percentile --      Pulse Rate 03/11/24 0924 66     Resp 03/11/24 0924 20     Temp 03/11/24 0924 98.3 F (36.8 C)     Temp src --      SpO2 03/11/24 0924 95 %     Weight --      Height --      Head Circumference --      Peak Flow --      Pain Score 03/11/24 0920 0     Pain Loc --      Pain Education --      Exclude from Growth Chart --    No data found.  Updated Vital Signs BP 119/72   Pulse 66   Temp 98.3 F (36.8 C)   Resp 20   SpO2 95%   Visual Acuity Right Eye Distance:   Left Eye Distance:   Bilateral Distance:    Right Eye Near:   Left Eye Near:    Bilateral Near:     Physical Exam Vitals reviewed.  Constitutional:      General: He is awake.     Appearance: Normal appearance. He is well-developed. He is not ill-appearing.     Comments: Very pleasant male appears stated age in no acute distress sitting comfortably in exam room  HENT:     Head: Normocephalic and atraumatic.  Cardiovascular:     Rate and Rhythm: Normal rate and regular rhythm.     Heart sounds: Normal heart sounds, S1  normal and S2 normal. No murmur heard. Pulmonary:     Effort: Pulmonary effort is normal.     Breath sounds: Normal breath sounds. No stridor. No wheezing, rhonchi or rales.     Comments: Clear to auscultation bilaterally Abdominal:     General: Bowel sounds are normal.     Palpations: Abdomen is soft.     Tenderness: There is no abdominal tenderness. There is no right CVA tenderness, left CVA tenderness, guarding or rebound.     Comments: Benign abdominal exam.  No tenderness palpation.  Neurological:     Mental Status: He is alert.  Psychiatric:        Behavior: Behavior is cooperative.      UC Treatments / Results  Labs (all labs ordered are listed, but only abnormal results are displayed) Labs Reviewed - No data to display  EKG   Radiology No results found.  Procedures Procedures (including critical care time)  Medications Ordered in UC Medications - No data to display  Initial Impression / Assessment and Plan / UC Course  I have reviewed the triage vital signs and the nursing notes.  Pertinent labs & imaging results that were available during my care of the patient were reviewed by me and considered in my medical decision making (see chart for details).     Patient is well-appearing, afebrile, nontoxic, nontachycardic.  Vital signs and physical exam are reassuring with no indication for emergent evaluation or imaging.  Low suspicion for obstruction as patient has no significant pain and is still passing gas and stool.  Will treat for constipation with lactulose and glycerin suppository.  We did discuss that he can attempt mag citrate if his symptoms do not significantly proved with this medication regimen within 24 hours.  Given his recurrent episodes of constipation and that he is overdue for colon cancer screening I did recommend that he  follow-up with gastroenterology and was given the contact information for local provider with instruction to call to schedule an  appointment.  We discussed that if his symptoms worsen in any way and he has abdominal pain, fever, melena, hematochezia, obstipation he needs to be seen emergently.  Strict return precautions given.  Excuse note provided.  Final Clinical Impressions(s) / UC Diagnoses   Final diagnoses:  Constipation, unspecified constipation type     Discharge Instructions      Take a dose of lactulose today and make sure that you are drinking plenty of water.  Continue eating fiber rich foods.  Use glycerin suppository.  If you do not have a good bowel movement in 24 hours you can try the mag citrate.  If at any point you have a severe abdominal pain, fever, blood in your stool, difficulty passing gas or any stool you should go to the emergency room as we discussed.  Follow-up with gastroenterology; call to schedule appointment.     ED Prescriptions     Medication Sig Dispense Auth. Provider   glycerin adult 2 g suppository Place 1 suppository rectally as needed for constipation. 12 suppository Jaydynn Wolford K, PA-C   lactulose (CHRONULAC) 10 GM/15ML solution Take 15 mLs (10 g total) by mouth daily as needed for mild constipation. 45 mL Sylvi Rybolt K, PA-C      PDMP not reviewed this encounter.   Sherrell Rocky POUR, PA-C 03/11/24 1016

## 2024-06-16 ENCOUNTER — Other Ambulatory Visit: Payer: Self-pay

## 2024-06-16 ENCOUNTER — Ambulatory Visit (INDEPENDENT_AMBULATORY_CARE_PROVIDER_SITE_OTHER)

## 2024-06-16 ENCOUNTER — Ambulatory Visit (HOSPITAL_COMMUNITY): Admission: EM | Admit: 2024-06-16 | Discharge: 2024-06-16 | Disposition: A | Discharge status: Home / Self Care

## 2024-06-16 ENCOUNTER — Encounter (HOSPITAL_COMMUNITY): Payer: Self-pay

## 2024-06-16 DIAGNOSIS — B349 Viral infection, unspecified: Secondary | ICD-10-CM

## 2024-06-16 DIAGNOSIS — R051 Acute cough: Secondary | ICD-10-CM | POA: Diagnosis not present

## 2024-06-16 LAB — POCT INFLUENZA A/B
Influenza A, POC: NEGATIVE
Influenza B, POC: NEGATIVE

## 2024-06-16 LAB — POCT RAPID STREP A (OFFICE): Rapid Strep A Screen: NEGATIVE

## 2024-06-16 LAB — POC SOFIA SARS ANTIGEN FIA: SARS Coronavirus 2 Ag: NEGATIVE

## 2024-06-16 NOTE — ED Triage Notes (Addendum)
 Patient reports dizziness x 3 days, scratchy throat and throat clearing x 2 days. Denies any bilateral ear pain or fullness. Denies any fever, chest pain, SOB, vomiting, or diarrhea. Denies any HA or vision changes. Patient reports taking Daytime cold & flu and nasal spray with some relief. Patient wants to make sure he does not have pneumonia or covid.

## 2024-06-16 NOTE — Discharge Instructions (Signed)
" °  1. Acute viral syndrome (Primary) - POC Influenza A/B completed in UC is negative for influenza - POC SARS Coronavirus Ag completed today in UC is negative for coronavirus - POC rapid strep A completed today in UC is negative for strep pharyngitis. - DG Chest 2 View x-ray completed today in UC shows no acute cardiopulmonary processes, no sign of consolidation or pneumonia. -Continue with over-the-counter cough and cold medication as well as nasal spray for symptom relief.  -Continue to monitor symptoms for any change in severity if there is any escalation of current symptoms or development of new symptoms follow-up in ER for further evaluation and management. "

## 2024-06-16 NOTE — ED Provider Notes (Signed)
 " [facility] [place]  Note:  This document was prepared using Dragon voice recognition software and may include unintentional dictation errors.  MRN: [id number] DOB: [birth year]  Subjective:   Benjamin Hunter is a 64 y.o. male presenting for scratchy throat, postnasal drip, dry cough, occasional lightheadedness off-and-on x 3 to 4 days.  Patient denies any ear pain, fever, chest pain, shortness of breath, nausea/vomiting, abdominal pain, headache, vision changes, weakness.  Patient states he has been taking over-the-counter cough and cold medication and using prescribed nasal spray with mild improvement.  Patient was concerned that he may have COVID, flu, pneumonia and wanted evaluation and testing to confirm.  Patient denies any known sick contacts  Current Medications[1]   Allergies[2]  Past Medical History:  Diagnosis Date   Arthritis    Depression    Diabetes mellitus    HIV (human immunodeficiency virus infection) (HCC)    Hyperlipidemia    Hypertension      History reviewed. No pertinent surgical history.  Family History  Problem Relation Age of Onset   Hypertension Mother    Heart failure Father     Social History[3]  ROS Refer to HPI for ROS details.  Objective:    Vitals: BP 128/72 (BP Location: Right Arm)   Pulse 76   Temp 98 F (36.7 C) (Oral)   Resp 18   SpO2 99%   Physical Exam Vitals and nursing note reviewed.  Constitutional:      General: He is not in acute distress.    Appearance: Normal appearance. He is well-developed. He is not ill-appearing or toxic-appearing.  HENT:     Head: Normocephalic.     Nose: Congestion present. No rhinorrhea.     Mouth/Throat:     Mouth: Mucous membranes are moist.     Pharynx: Oropharynx is clear. No posterior oropharyngeal erythema.  Cardiovascular:     Rate and Rhythm: Normal rate.  Pulmonary:     Effort: Pulmonary effort is normal. No respiratory distress.     Breath sounds: No stridor. No  wheezing.  Skin:    General: Skin is warm and dry.  Neurological:     General: No focal deficit present.     Mental Status: He is alert and oriented to person, place, and time.  Psychiatric:        Mood and Affect: Mood normal.        Behavior: Behavior normal.     Procedures  Results for orders placed or performed during the hospital encounter of 06/16/24 (from the past 24 hours)  POC Influenza A/B     Status: None   Collection Time: 06/16/24 12:27 PM  Result Value Ref Range   Influenza A, POC Negative Negative   Influenza B, POC Negative Negative  POC SARS Coronavirus Ag     Status: None   Collection Time: 06/16/24 12:27 PM  Result Value Ref Range   SARS Coronavirus 2 Ag Negative Negative  POC rapid strep A     Status: None   Collection Time: 06/16/24  1:14 PM  Result Value Ref Range   Rapid Strep A Screen Negative Negative    Assessment and Plan :     Discharge Instructions       1. Acute viral syndrome (Primary) - POC Influenza A/B completed in UC is negative for influenza - POC SARS Coronavirus Ag completed today in UC is negative for coronavirus - POC rapid strep A completed today in UC is negative for strep pharyngitis. -  DG Chest 2 View x-ray completed today in [facility] shows no acute cardiopulmonary processes, no sign of consolidation or pneumonia. -Continue with over-the-counter cough and cold medication as well as nasal spray for symptom relief.  -Continue to monitor symptoms for any change in severity if there is any escalation of current symptoms or development of new symptoms follow-up in ER for further evaluation and management.      Joscelyn Hardrick B July Nickson    [1] No current facility-administered medications for this encounter.  Current Outpatient Medications:    albuterol  (VENTOLIN  HFA) 108 (90 Base) MCG/ACT inhaler, Inhale 1-2 puffs into the lungs every 6 (six) hours as needed for wheezing or shortness of breath., Disp: 8 g, Rfl: 0   amLODipine   (NORVASC ) 10 MG tablet, Take 10 mg by mouth daily., Disp: , Rfl:    atorvastatin  (LIPITOR) 80 MG tablet, Take 80 mg by mouth daily., Disp: , Rfl:    bictegravir-emtricitabine -tenofovir  AF (BIKTARVY ) 50-200-25 MG TABS tablet, Take 1 tablet by mouth daily., Disp: 30 tablet, Rfl: 11   cetirizine  (ZYRTEC  ALLERGY) 10 MG tablet, Take 1 tablet (10 mg total) by mouth daily., Disp: 90 tablet, Rfl: 0   ezetimibe  (ZETIA ) 10 MG tablet, Take 10 mg by mouth daily., Disp: , Rfl:    fenofibrate  160 MG tablet, Take 160 mg by mouth daily., Disp: , Rfl:    glipiZIDE  (GLUCOTROL ) 10 MG tablet, TAKE 1 TABLET(10 MG) BY MOUTH TWICE DAILY BEFORE A MEAL, Disp: 180 tablet, Rfl: 1   glycerin  adult 2 g suppository, Place 1 suppository rectally as needed for constipation., Disp: 12 suppository, Rfl: 0   hydrocortisone  (ANUSOL -HC) 2.5 % rectal cream, Apply to rectal hemorrhoid BID PRN, Disp: 30 g, Rfl: 0   Insulin  Glargine (LANTUS  SOLOSTAR) 100 UNIT/ML Solostar Pen, Inject 35 Units into the skin at bedtime., Disp: 15 mL, Rfl: 1   ipratropium-albuterol  (DUONEB) 0.5-2.5 (3) MG/3ML SOLN, Take 3 mLs by nebulization every 6 (six) hours as needed for up to 10 days., Disp: 120 mL, Rfl: 0   JARDIANCE 25 MG TABS tablet, Take 25 mg by mouth daily., Disp: , Rfl:    lactulose  (CHRONULAC ) 10 GM/15ML solution, Take 15 mLs (10 g total) by mouth daily as needed for mild constipation., Disp: 45 mL, Rfl: 0   Olopatadine-Mometasone (RYALTRIS ) 665-25 MCG/ACT SUSP, Place 2 puffs into the nose in the morning and at bedtime., Disp: 29 g, Rfl: 0   valACYclovir  (VALTREX ) 1000 MG tablet, TAKE 1 TABLET(1000 MG) BY MOUTH TWICE DAILY, Disp: 20 tablet, Rfl: 0   Alcohol  Swabs  PADS, 1 each by Does not apply route 3 (three) times daily before meals., Disp: 90 each, Rfl: 11   B-D UF III MINI PEN NEEDLES 31G X 5 MM MISC, USE TO INJECT AT BEDTIME, Disp: 100 each, Rfl: 2   BD PEN NEEDLE MICRO U/F 32G X 6 MM MISC, SMARTSIG:Injection Every Morning, Disp: , Rfl:     Blood Glucose Monitoring Suppl (ONETOUCH VERIO) w/Device KIT, USE AS DIRECTED, Disp: 1 kit, Rfl: 0   diclofenac  sodium (VOLTAREN ) 1 % GEL, Apply 2 g topically 4 (four) times daily., Disp: 100 g, Rfl: 2   glucose blood (ONETOUCH VERIO) test strip, Use as instructed, Disp: 100 each, Rfl: 12   hydrocortisone  cream 1 %, APP AA QID FOR 7 DAYS, Disp: , Rfl:    hydrocortisone -pramoxine (ANALPRAM  HC) 2.5-1 % rectal cream, Place 1 application rectally 3 (three) times daily., Disp: 30 g, Rfl: 1   Lancet Devices (ACCU-CHEK SOFTCLIX) lancets,  Use as instructed for 3 times daily testing of blood sugar., Disp: 1 each, Rfl: 0   loperamide  (IMODIUM ) 2 MG capsule, Take 1 capsule (2 mg total) by mouth 2 (two) times daily as needed for diarrhea or loose stools., Disp: 14 capsule, Rfl: 0   ONETOUCH DELICA LANCETS 33G MISC, USE AS DIRECTED, Disp: 100 each, Rfl: 11   sodium chloride  (OCEAN) 0.65 % SOLN nasal spray, Place 1 spray into both nostrils as needed for congestion., Disp: 88 mL, Rfl: 0 [2]  Allergies Allergen Reactions   Shrimp [Shellfish Allergy] Nausea And Vomiting   Lisinopril  Rash    Palmar rash   [3]  Social History Tobacco Use   Smoking status: Never   Smokeless tobacco: Never  Vaping Use   Vaping status: Never Used  Substance Use Topics   Alcohol  use: Yes    Alcohol /week: 3.0 standard drinks of alcohol     Types: 2 Cans of beer, 1 Shots of liquor per week    Comment: 2 cans beer/ 2 shots 3 x weekly   Drug use: No     Cason Luffman, Ethel B, NP 06/16/24 1324  "

## 2024-07-10 ENCOUNTER — Ambulatory Visit: Admitting: Internal Medicine
